# Patient Record
Sex: Female | Born: 1935 | Race: Asian | Hispanic: No | Marital: Single | Smoking: Former smoker
Health system: Southern US, Community
[De-identification: ages and names within clinical notes are randomized; demographics above are authoritative.]

## PROBLEM LIST (undated history)

## (undated) DIAGNOSIS — N6310 Unspecified lump in the right breast, unspecified quadrant: Secondary | ICD-10-CM

## (undated) DIAGNOSIS — M8580 Other specified disorders of bone density and structure, unspecified site: Secondary | ICD-10-CM

## (undated) DIAGNOSIS — R1312 Dysphagia, oropharyngeal phase: Secondary | ICD-10-CM

## (undated) DIAGNOSIS — E1140 Type 2 diabetes mellitus with diabetic neuropathy, unspecified: Secondary | ICD-10-CM

## (undated) DIAGNOSIS — D72829 Elevated white blood cell count, unspecified: Secondary | ICD-10-CM

## (undated) DIAGNOSIS — D509 Iron deficiency anemia, unspecified: Secondary | ICD-10-CM

## (undated) DIAGNOSIS — E089 Diabetes mellitus due to underlying condition without complications: Secondary | ICD-10-CM

## (undated) DIAGNOSIS — I110 Hypertensive heart disease with heart failure: Secondary | ICD-10-CM

## (undated) DIAGNOSIS — K219 Gastro-esophageal reflux disease without esophagitis: Secondary | ICD-10-CM

## (undated) DIAGNOSIS — R911 Solitary pulmonary nodule: Secondary | ICD-10-CM

## (undated) DIAGNOSIS — N182 Chronic kidney disease, stage 2 (mild): Secondary | ICD-10-CM

## (undated) DIAGNOSIS — I10 Essential (primary) hypertension: Secondary | ICD-10-CM

## (undated) DIAGNOSIS — K5900 Constipation, unspecified: Secondary | ICD-10-CM

## (undated) DIAGNOSIS — E559 Vitamin D deficiency, unspecified: Secondary | ICD-10-CM

## (undated) DIAGNOSIS — R55 Syncope and collapse: Secondary | ICD-10-CM

## (undated) DIAGNOSIS — M109 Gout, unspecified: Secondary | ICD-10-CM

## (undated) DIAGNOSIS — S22089A Unspecified fracture of T11-T12 vertebra, initial encounter for closed fracture: Secondary | ICD-10-CM

## (undated) DIAGNOSIS — E119 Type 2 diabetes mellitus without complications: Secondary | ICD-10-CM

## (undated) DIAGNOSIS — I5032 Chronic diastolic (congestive) heart failure: Secondary | ICD-10-CM

## (undated) DIAGNOSIS — M545 Low back pain: Secondary | ICD-10-CM

## (undated) DIAGNOSIS — F329 Major depressive disorder, single episode, unspecified: Secondary | ICD-10-CM

## (undated) HISTORY — DX: Vitamin D deficiency, unspecified: E55.9

## (undated) HISTORY — DX: Diabetes mellitus due to underlying condition without complications: E08.9

## (undated) HISTORY — DX: Constipation, unspecified: K59.00

## (undated) HISTORY — DX: Gastro-esophageal reflux disease without esophagitis: K21.9

## (undated) HISTORY — DX: Type 2 diabetes mellitus with diabetic neuropathy, unspecified: E11.40

## (undated) HISTORY — DX: Iron deficiency anemia, unspecified: D50.9

## (undated) HISTORY — DX: Chronic diastolic (congestive) heart failure: I50.32

## (undated) HISTORY — DX: Chronic kidney disease, stage 2 (mild): N18.2

## (undated) HISTORY — DX: Dysphagia, oropharyngeal phase: R13.12

## (undated) HISTORY — DX: Elevated white blood cell count, unspecified: D72.829

## (undated) HISTORY — DX: Major depressive disorder, single episode, unspecified: F32.9

## (undated) HISTORY — DX: Low back pain: M54.5

## (undated) HISTORY — DX: Hypertensive heart disease with heart failure: I11.0

## (undated) HISTORY — DX: Unspecified fracture of t11-T12 vertebra, initial encounter for closed fracture: S22.089A

## (undated) HISTORY — DX: Solitary pulmonary nodule: R91.1

## (undated) HISTORY — DX: Other specified disorders of bone density and structure, unspecified site: M85.80

## (undated) HISTORY — PX: NO PAST SURGERIES: SHX2092

## (undated) HISTORY — DX: Unspecified lump in the right breast, unspecified quadrant: N63.10

## (undated) HISTORY — DX: Syncope and collapse: R55

---

## 1999-07-29 ENCOUNTER — Other Ambulatory Visit: Admission: RE | Admit: 1999-07-29 | Discharge: 1999-07-29 | Payer: Self-pay | Admitting: Family Medicine

## 1999-12-26 ENCOUNTER — Emergency Department (HOSPITAL_COMMUNITY): Admission: EM | Admit: 1999-12-26 | Discharge: 1999-12-26 | Payer: Self-pay | Admitting: Emergency Medicine

## 2001-01-15 ENCOUNTER — Ambulatory Visit (HOSPITAL_BASED_OUTPATIENT_CLINIC_OR_DEPARTMENT_OTHER): Admission: RE | Admit: 2001-01-15 | Discharge: 2001-01-15 | Payer: Self-pay | Admitting: *Deleted

## 2001-01-15 ENCOUNTER — Encounter (INDEPENDENT_AMBULATORY_CARE_PROVIDER_SITE_OTHER): Payer: Self-pay | Admitting: *Deleted

## 2002-08-13 ENCOUNTER — Ambulatory Visit (HOSPITAL_COMMUNITY): Admission: RE | Admit: 2002-08-13 | Discharge: 2002-08-14 | Payer: Self-pay | Admitting: Ophthalmology

## 2002-08-13 ENCOUNTER — Encounter: Payer: Self-pay | Admitting: Ophthalmology

## 2005-12-14 ENCOUNTER — Inpatient Hospital Stay (HOSPITAL_COMMUNITY): Admission: EM | Admit: 2005-12-14 | Discharge: 2005-12-20 | Payer: Self-pay | Admitting: Emergency Medicine

## 2007-04-18 ENCOUNTER — Emergency Department (HOSPITAL_COMMUNITY): Admission: EM | Admit: 2007-04-18 | Discharge: 2007-04-18 | Payer: Self-pay | Admitting: Emergency Medicine

## 2007-10-12 ENCOUNTER — Emergency Department (HOSPITAL_COMMUNITY): Admission: EM | Admit: 2007-10-12 | Discharge: 2007-10-12 | Payer: Self-pay | Admitting: Emergency Medicine

## 2009-11-24 ENCOUNTER — Emergency Department (HOSPITAL_BASED_OUTPATIENT_CLINIC_OR_DEPARTMENT_OTHER): Admission: EM | Admit: 2009-11-24 | Discharge: 2009-11-25 | Payer: Self-pay | Admitting: Emergency Medicine

## 2009-11-25 ENCOUNTER — Ambulatory Visit: Payer: Self-pay | Admitting: Diagnostic Radiology

## 2010-06-27 DEATH — deceased

## 2010-09-13 LAB — CBC
MCHC: 33.8 g/dL (ref 30.0–36.0)
RBC: 3.29 MIL/uL — ABNORMAL LOW (ref 3.87–5.11)

## 2010-09-13 LAB — DIFFERENTIAL
Eosinophils Absolute: 0 10*3/uL (ref 0.0–0.7)
Lymphocytes Relative: 21 % (ref 12–46)
Lymphs Abs: 1 10*3/uL (ref 0.7–4.0)
Monocytes Absolute: 0.5 10*3/uL (ref 0.1–1.0)
Neutro Abs: 3.4 10*3/uL (ref 1.7–7.7)
Neutrophils Relative %: 68 % (ref 43–77)

## 2010-11-12 NOTE — H&P (Signed)
NAME:  Alcoser, Nolia                       ACCOUNT NO.:  [id number]   MEDICAL RECORD NO.:  [id number]          PATIENT TYPE:  EMS   LOCATION:  MAJO                         FACILITY:  [facility]   PHYSICIAN:  Sherin Quarry, MD      DATE OF BIRTH:  [birth year]   DATE OF ADMISSION:  12/14/2005  DATE OF DISCHARGE:                                HISTORY & PHYSICAL   HISTORY OF PRESENT ILLNESS:  Margaret [name] is a 77 year old lady of [place] who does not speak any [place].  Fortunately, she is accompanied by  her daughter who is quite fluent.  Her daughter states that Margaret [name] lives  with her son.  For the last 2 weeks, Margaret [name] has been experiencing  decreased appetite with a 15-pound weight loss.  She has been very thirsty,  has been urinating frequently, and has noted a lot of vaginal itching.  She  has not been vomiting.  She has not had any fevers or chills.  There has  been no abdominal pain, no change in bowel habits.  She presented to [facility] yesterday where it was suspected that she had  diabetes.  She was started on Glucophage, but unfortunately she could not  tolerate this medication because of nausea and vomiting.  Therefore, she was  referred to the emergency room for further evaluation.  On arrival to the  emergency room, her blood sugar was noted to Mykeisha a greater than 700.  Her pH  was 7.448, bicarbonate was 30.2, hemoglobin was 17, potassium 4.0.  She is  admitted at this time for management of uncontrolled diabetes.   PAST MEDICAL HISTORY:   CURRENT MEDICATIONS:  Hydrochlorothiazide 12.5 mg daily.   ALLERGIES:  There are no known drug allergies.   OPERATIONS:  Her daughter says that she can recall that on one occasion some  type of debridement procedure was done on her ear.  Otherwise, she has not  had any other surgery.   MEDICAL ILLNESSES:  She has never been hospitalized for medical illnesses.  She apparently at one point was diagnosed with hypertension  and is currently  taking hydrochlorothiazide.   FAMILY HISTORY:  She has a sister in [place] but she does not really know  much about her health status.  She has an aunt who has diabetes.  She says  that her mother died of cancer of the liver and that her father died when  she was very young, and she really does not know what the cause of his death  was.   SOCIAL HISTORY:  [name] smoked up until about 10 years ago when she  discontinued this practice.  She does not abuse alcohol or drugs.  As  previously mentioned, she lives with her son.  Normally, she is very  physically active.   REVIEW OF SYSTEMS:  HEAD:  She denies headache or dizziness.  EYES:  She has  noted some visual blurring.  There has been no diplopia.  EARS, NOSE,  THROAT:  She denies  earache, sinus pain or sore throat.  CHEST:  There has  been no coughing wheezing or chest pain.  Her daughter reports that she has  had mild dyspnea on exertion.  CARDIOVASCULAR:  She denies orthopnea, PND or  ankle edema.  GI: See above.  GU: There has been no dysuria.  There has been  no hematuria.  NEUROLOGIC:  There is no history of seizure or stroke.  ENDOCRINE:  See above.   PHYSICAL EXAMINATION:  GENERAL:  She is a very pleasant lady who is quite  cooperative.  HEENT:  Within normal limits.  CHEST:  Clear to auscultation.  Breath sounds are perhaps slightly  diminished.  CARDIOVASCULAR:  Normal S1 and S2.  There are no rubs, murmurs or gallops.  ABDOMEN:  Benign.  There are normal bowel sounds without masses or  tenderness.  NEUROLOGIC:  Neurologic testing and examination of extremities is normal.   IMPRESSION:  1.  Uncontrolled diabetes, glucose greater than 700.  2.  Dehydration.  3.  History of hypertension.  4.  Possible dysphagia.  5.  Fifty pack-year smoking history.   PLAN:  Initially, our plan should focus on treatment of her diabetes.  We  will give her vigorous IV fluid resuscitation and start IV insulin per   [name] protocol.  Will monitor blood sugars closely, as well as BMET.  Then, the patient will need to Irine placed on an outpatient regimen which will  probably consist of an oral hypoglycemic agent and long-acting insulin.  Will obtain a chest x-ray in light of her smoking history and probably do a  barium swallow in light of her history of dysphagia.           ______________________________  Sherin Quarry, MD     SY/MEDQ  D:  12/14/2005  T:  12/14/2005  Job:  [id number]

## 2010-11-12 NOTE — Discharge Summary (Signed)
NAME:  Margaret [name]                       ACCOUNT NO.:  [id number]   MEDICAL RECORD NO.:  [id number]          PATIENT TYPE:  INP   LOCATION:  5532                         FACILITY:  [facility]   PHYSICIAN:  Sherin Quarry, MD      DATE OF BIRTH:  15-Aug-1931   DATE OF ADMISSION:  12/14/2005  DATE OF DISCHARGE:  12/16/2005                                 DISCHARGE SUMMARY   Margaret [name] is a 77 year old lady of [place] who is not fluent in  [place].  She was accompanied by her daughter who offered translation. Her  daughter reported that for two weeks prior to admission, the patient had  been experiencing decreased appetite, excessive thirst, urinating  frequently, and vaginal itching. She had not been vomiting or having any  fevers, chills or abdominal pain. She presented [facility]  on June 19 where it was suspected that she had evidence of diabetes.  She  was started on Glucophage but, unfortunately, she could not tolerate this  medicine because of nausea and vomiting. When this was reported, she was  advised to come to the emergency room for further evaluation. On arrival to  the emergency room, a blood sugar was obtained which showed a glucose value  that was greater than 700, the BUN was 19, creatinine was 0.8, potassium  was 4, CO2 was 30. The patient was felt to have evidence of a hyperosmolar  non-ketotic state and, therefore, was admitted for further evaluation and  treatment.   Physical examination at the time of admission revealed a pleasant  cooperative lady who appeared to Noralyn moderately acutely ill.  HEENT exam was  within normal limits.  The chest was clear.  Cardiovascular exam showed  normal S1 and S2 without rubs, murmurs, or gallops.  The abdomen was benign.  On neurologic testing, cranial nerves, motor, sensory and cerebellar testing  was normal. Examination of the extremities showed no evidence of cyanosis or  edema.   Other relevant laboratory  studies included a urinalysis which was remarkable  only for glucosuria. Lipid profile showed a cholesterol of 142 with an LDL  of 90.  A1c hemoglobin was greater than 19.2   On admission the patient was placed on IV Glucomander protocol and she  remained on IV insulin for the next 12 hours.  She was also started on an IV  of normal saline.  She received 1 liter bolus, then 500 mL for per hour for  4 hours, then 250 mL/hour for 4 hours, then went 150 mL/hour.  The BMP was  very carefully monitored and not surprisingly her potassium level came down  to 2.9.  She, therefore, received supplemental potassium by the intravenous  and oral route.  By June 21, her blood sugar was down to the 100 range and,  therefore, intravenous insulin was discontinued. At the time the intravenous  insulin was discontinued, the patient was given 10 units of Lantus insulin.  Subsequently, the dosage of Lantus insulin was gradually increased and  Lantus insulin was supplemented  with Glucotrol, initially 5 mg b.i.d.  It  was felt that the patient might respond well to a combination of long acting  insulin and an oral hypoglycemic agent.  I did not resume Glucophage because  of the patient's experience of nausea and vomiting with this medication.  There was some difficulty in obtaining assistance from someone who was  fluent in English to translate for her, eventually I was able to get a  granddaughter to come in and assist [place] with this process. Before the patient  was discharged, it was ensured that there was an understanding of how to  administer insulin and how to measure blood sugars.   DISCHARGE DIAGNOSIS:  1.  Uncontrolled diabetes with hyperosmolar state and A1c hemoglobin greater      than 19.  2.  Dehydration secondary to number 1.  3.  History of hypertension, note the patient's blood pressure was 110 to      120 over 70 during the hospitalization.  4.  Possible dysphagia.  5.  50-pack-year smoking  history.   DISCHARGE MEDICATIONS:  The patient was instructed to withhold  hydrochlorothiazide. She was given Protonix 40 mg daily, Lantus insulin 30  units daily, and Glucotrol XL 10 mg the morning and 5 mg in the evening.  It  was carefully explained both to the patient and to the family that there  would Nakisha no practical way to optimally regulate her diabetes in the hospital  and she would need very frequent follow up with her primary care doctor to  adjust her medications and assure she had good blood sugar control.  I urged  them to contact Dr. Pablo Lawrence office on Monday to arrange a return appointment  during the next week.  The family members appeared to understand these  instructions.           ______________________________  Sherin Quarry, MD     SY/MEDQ  D:  12/17/2005  T:  12/17/2005  Job:  [id number]   cc:   Molly Maduro L. Foy [place], M.D.  Fax: [phone]

## 2010-11-12 NOTE — Discharge Summary (Signed)
NAME:  Margaret [name], Margaret [name]                       ACCOUNT NO.:  [id number]   MEDICAL RECORD NO.:  [id number]          PATIENT TYPE:  INP   LOCATION:  5532                         FACILITY:  [facility]   PHYSICIAN:  Sherin Quarry, MD      DATE OF BIRTH:  01-08-1932   DATE OF ADMISSION:  12/14/2005  DATE OF DISCHARGE:  12/20/2005                                 DISCHARGE SUMMARY   HISTORY OF PRESENT ILLNESS:  Margaret [name] is a 77 year old lady who initially  presented to [name] on December 14, 2005 with a 2-week history of  anorexia, 15 pound weight loss, excessive thirst, urinating frequently and  vaginal itching.  She presented to [name] were she was  seen by Margaret [name] who suspected that she had worsening diabetes, and,  therefore, started her on Glucophage.  Unfortunately, she could not tolerate  this medication because of nausea and vomiting.  The family, therefore,  brought her to the emergency room where her blood sugar was noted to Margaret [name]  greater than 700.  She was not acidotic.  She was admitted for management of  uncontrolled diabetes.   PHYSICAL EXAMINATION AT THE TIME OF ADMISSION:  HEENT:  Within normal  limits.  CHEST:  Clear.  CARDIOVASCULAR:  Normal S1-S2.  ABDOMEN:  Benign.  NEUROLOGIC:  Neurologic testing and examination of the extremities was  normal.   OTHER RELEVANT LABORATORY STUDIES:  Sodium 128, potassium 4.0, BUN of 19,  creatinine 0.8.  CO2 was 30.  A1c hemoglobin was greater than 19.2.  Lipid  profile showed an LDL cholesterol of 90.  Urinalysis was negative.  A chest  x-ray showed only a stable 1-cm nodule which had been seen on previous x-  rays.   On admission, the patient was placed on [name] protocol of intravenous  insulin.  She was given an IV of normal saline, 1 liter as a bolus and the  500 cc per hour x 4 hours, 250 cc per hour x 4 hours, then 150 cc per hour.  She was placed on Protonix 40 mg IV every 12 hours.  Zofran was given  p.r.n.  for nausea.  After she had been on the intravenous insulin for a while, she  became hypokalemic and was therefore given supplemental potassium by the  intravenous route.  On December 15, 2005, her intravenous insulin was  discontinued, and she was given Lantus insulin.  Subsequently, her blood  sugars were monitored, and she was found to Margaret [name] quite insulin resistant.  Her  Lantus insulin dose was steadily increased until, by December 19, 2005, she was  receiving 35 units in the morning, 45 units in the evening.  This was  supplemented with oral agents, as noted below.  Blood sugars were monitored  on a q.i.d. basis, and by December 20, 2005, the blood sugars were down to the  range of 150-170.  During the course her hospitalization, extensive efforts  were made to teach the family how to administer insulin and how to check  blood  sugars.  Multiple family members were involved in this process.  The  diabetes coordinator also saw the patient.  On December 20, 2005, it was felt  the patient was ready for discharge.   DISCHARGE DIAGNOSES:  1.  Uncontrolled diabetes.  A1c hemoglobin greater than 19.  2.  Dehydration.  3.  History of hypertension.  4.  Fifty pack-year smoking history.   DISCHARGE MEDICATIONS:  1.  Protonix 40 mg daily.  2.  K-Dur 20 mEq daily.  3.  Glucotrol XL 5 mg p.o. in the morning, 10 mg p.o. in the evening.  4.  Lantus insulin 35 units in the morning, 45 units in the evening.  5.  Avandia 4 mg daily.  (The patient will not receive Glucophage because of her previous reaction to  this medication.)   I contacted Margaret [name] at [name] and advised her to Wyn[place]  sure that their office contacted Margaret [name] to ensure that she would come back  for regular appointments.  They indicated that she would see a diabetes  educator there.  A home health nurse referral was made.           ______________________________  Sherin Quarry, MD     SY/MEDQ  D:  12/20/2005  T:   12/20/2005  Job:  [id number]

## 2010-11-12 NOTE — Op Note (Signed)
[place]  Patient:    Margaret [name], Margaret [name]                            MRN: [id number] Proc. Date: 01/15/01 Adm. Date:  [id number] Attending:  Claudina Lick                           Operative Report  PREOPERATIVE DIAGNOSIS:  Attic and mastoid cholesteatoma, right ear.  POSTOPERATIVE DIAGNOSIS:  Attic and mastoid cholesteatoma, right ear.  OPERATION PERFORMED:  Right tympanomastoidectomy with ossicular reconstruction.  SURGEON:  Robert L. Lyman Bishop, M.D.  ANESTHESIA:  General.  INDICATIONS FOR PROCEDURE:  This 77 year old white female was admitted with a history of pain and pressure and drainage from her right ear.  The patient has had previous mastoid surgery approximately 10 years prior to admission.  This patient does not speak [place].  Her daughter related history.  She did not recall who did the surgery or precisely what was done.  When seen in the office the patient had an intact right tympanic membrane without landmarks and there was a brownish crust on the posterior superior canal wall which when removed revealed some cholesteatoma debris.  On the left side, the patient has a large central perforation again with loss of landmarks.  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 The patient admitted for surgery.  DESCRIPTION OF PROCEDURE:  After satisfactory general endotracheal anesthesia had been induced, 1% Xylocaine containing 1:100,100 epinephrine was infiltrated postauricularly and in the right external  ear canal after which the external ear and surrounding area were prepped with Betadine and sterile drapes applied.  Vertical incisions were made in the canal at 12 and 6 oclock and connected horizontally over the posterior canal wall medial to the bony cartilaginous junction.  The previous postauricular incision was reopened. The periosteum was incised over the mastoid cortex and immediately underlying this was a large  mass of  cholesteatoma completely filling the mastoid cavity. The skin was then elevated from the posterior canal and the external ear and lateral posterior can skin were then retracted out of the way with [facility] retractor.  The remaining more medial portion of the posterior canal skin was then elevated down to the fibrous annulus and the middle ear space was entered with a shallow middle ear space with some scarring but no fluid and no evidence of cholesteatoma.  There was, however, a large attic defect that communicated with the large mastoid cholesteatoma.  I could not identify any ossicles and there was a large mass of cholesteatoma inthe attic region.  I did not feel that this could Ashiah appropriately treated with an intact canal wall procedure.  Therefore, the posterior canal wall was taken down with the Lempert rongeurs and the anterior and posterior buttresses smoothed down with the drill.  The mass of cholesteatoma debris was removed from the mastoid cavity leaving the cholesteatoma matrix as much intact as possible.  This resulted in a small mastoid cavity.  In the attic region were found remnants of the incus and the head of the malleus, both of which were chronically diseased and completely encased with cholesteatoma and were removed.  There was a thin, fixed piece of bone over the stapedial area covered with a thin layer of skin.  The skin was removed.  The bone was dissected up and I could see it covered the stapes,  the head of which was absent but the stapes otherwise appeared intact and mobile.  The shelf of bone was removed. A piece of temporalis fascia had been taken for use as a graft and placed aside to dry.  The external meatus was enlarged removing some conchal cartilage and a small portion of this conchal cartilage was placed over the remaining superstructure of stapes and after packing the remaining middle ear with pledgets of Gelfoam saturated with Cortisporin  suspension. The temporalis fascia  graft was placed under the remaining eardrum allowed over the cartilage graft onto the facial ridge from which the squamous epithelium had been elevated and then this was replaced over the graft.  The posterior canal wall had been taken down with the  drill almost flush with the tip of the small mastoid cavity and the inferiorly based posterior canal skin was then allowed to extend over the remaining posterior canal wall but not overlapping the cholesteatoma matrix in the mastoid itself.  Pledgets of Gelfoam saturated with Cortisporin suspension were placed over the grafted area and the remainder of the canal and mastoid cavity was packed with three two inch strips of Adaptic gauze impregnated with Cortisporin ointment.  Postauricular incision was closed with 3 and 4 chromic gut to the subcutaneous tissues with running 5-0 nylon to the skin.  A mattress suture of 2-0 silk was placed to the external ear posteriorly and through the lateral posterior canal skin flap and tied down over a bolus both in the external meatus and externally to secure the skin in the enlarged external meatus.  A lightly compressive dressing was applied.  Estimated blood loss 10 to 15 cc.  The patient tolerated the procedure well, was awakened from anesthesia and taken to the recovery room in satisfactory condition. DD:  01/15/01 TD:  01/15/01 Job: [id number]

## 2010-11-12 NOTE — Op Note (Signed)
NAME:  Margaret [name], Margaret [name] THI                               ACCOUNT NO.:  [id number]   MEDICAL RECORD NO.:  [id number]                   PATIENT TYPE:  OIB   LOCATION:  2550                                 FACILITY:  [facility]   PHYSICIAN:  John D. Ashley Royalty, M.D.              DATE OF BIRTH:  04/20/1932   DATE OF PROCEDURE:  08/13/2002  DATE OF DISCHARGE:                                 OPERATIVE REPORT   ADMISSION DIAGNOSIS:  Vitreous hemorrhage, left eye; possible retinal  detachment, left eye.   POSTOPERATIVE DIAGNOSIS:  Vitreous hemorrhage, left eye and subretinal  fibrosis, left eye and preretinal fibrosis, left eye.   OPERATION PERFORMED:  Pars plana vitrectomy with retinal photocoagulation  and membrane peel, left eye.   SURGEON:  Beulah Gandy. Ashley Royalty, M.D.   ASSISTANT:  Merian Capron, MA   ANESTHESIA:  General.   DESCRIPTION OF PROCEDURE:  Usual prep and drape.  Peritomies at 10, 2 and 4  o'clock.  A 4 mm angled infusion port anchored into place at 4 o'clock.  The  lighted pick was placed at 10 and 2 o'clock respectively.  The contact lens  ring was anchored into place at 6 and 12 o'clock.  The pars plana vitrectomy  was begun just behind the pseudophakos.  A white leathery vitreous was  encountered.  This was carefully removed under low suction and rapid  cutting.  The core vitrectomy was completed and the posterior retina became  apparent.  There were large amounts of old white blood beneath the retina  and several areas of dark red blood beneath the retina.  This process  appeared to Kawehi old and fibrotic.  There was no active bleeding.  The  vitrectomy was carried down to the macular surface where fibrotic membranes  were carefully peeled with a membrane pick and removed with the vitreous  cutter.  The vitrectomy was carried into the peripheral vitreous space with  a 30 degree prismatic lens and all the vitreous was removed down to the  vitreous base for 360 degrees.  Once this was  completed, the endo laser was  positioned in the eye and 291 burns were placed around the retinal periphery  with a power of 400 mw, 1000 microns each and 0.1 seconds each.  The  instruments were removed from the eye and 9-0 nylon was used to close the  sclerotomy sites.  They were tested and found to Genene tight with a Weck-cel.  Closing tension was 10 with a Barraquer tonometer.  Polymyxin and gentamicin  were irrigated into Tenon's space.  Atropine solution was applied.  Decadron  10 mg was injected into the lower subconjunctival space.  Marcaine was  injected around the globe for postoperative pain.  The conjunctiva was  closed with wet field cautery.  Polysporin, a patch and shield were placed.  The patient was awakened and taken to  recovery in satisfactory condition.                                                Beulah Gandy. Ashley Royalty, M.D.    JDM/MEDQ  D:  08/13/2002  T:  08/13/2002  Job:  [id number]

## 2010-12-13 ENCOUNTER — Emergency Department (HOSPITAL_COMMUNITY)
Admission: EM | Admit: 2010-12-13 | Discharge: 2010-12-13 | Disposition: A | Discharge status: Home / Self Care | Payer: Medicare Other | Attending: Emergency Medicine | Admitting: Emergency Medicine

## 2010-12-13 ENCOUNTER — Emergency Department (HOSPITAL_COMMUNITY): Payer: Medicare Other

## 2010-12-13 DIAGNOSIS — E119 Type 2 diabetes mellitus without complications: Secondary | ICD-10-CM | POA: Insufficient documentation

## 2010-12-13 DIAGNOSIS — M109 Gout, unspecified: Secondary | ICD-10-CM | POA: Insufficient documentation

## 2010-12-13 DIAGNOSIS — M79609 Pain in unspecified limb: Secondary | ICD-10-CM | POA: Insufficient documentation

## 2010-12-13 DIAGNOSIS — E7800 Pure hypercholesterolemia, unspecified: Secondary | ICD-10-CM | POA: Insufficient documentation

## 2010-12-13 DIAGNOSIS — I10 Essential (primary) hypertension: Secondary | ICD-10-CM | POA: Insufficient documentation

## 2010-12-13 LAB — DIFFERENTIAL
Basophils Absolute: 0 10*3/uL (ref 0.0–0.1)
Basophils Relative: 0 % (ref 0–1)
Lymphocytes Relative: 22 % (ref 12–46)
Lymphs Abs: 1.3 10*3/uL (ref 0.7–4.0)
Monocytes Absolute: 0.4 10*3/uL (ref 0.1–1.0)
Monocytes Relative: 7 % (ref 3–12)
Neutro Abs: 4.3 10*3/uL (ref 1.7–7.7)
Neutrophils Relative %: 70 % (ref 43–77)

## 2010-12-13 LAB — BASIC METABOLIC PANEL
BUN: 14 mg/dL (ref 6–23)
CO2: 31 mEq/L (ref 19–32)
Chloride: 99 mEq/L (ref 96–112)
Creatinine, Ser: 0.84 mg/dL (ref 0.50–1.10)
Glucose, Bld: 114 mg/dL — ABNORMAL HIGH (ref 70–99)
Potassium: 3.6 mEq/L (ref 3.5–5.1)
Sodium: 140 mEq/L (ref 135–145)

## 2010-12-13 LAB — CBC
HCT: 35 % — ABNORMAL LOW (ref 36.0–46.0)
MCH: 28.4 pg (ref 26.0–34.0)
MCHC: 32 g/dL (ref 30.0–36.0)
MCV: 88.8 fL (ref 78.0–100.0)
Platelets: 244 10*3/uL (ref 150–400)
RBC: 3.94 MIL/uL (ref 3.87–5.11)

## 2010-12-14 ENCOUNTER — Emergency Department (HOSPITAL_COMMUNITY)
Admission: EM | Admit: 2010-12-14 | Discharge: 2010-12-14 | Disposition: A | Discharge status: Home / Self Care | Payer: Medicare Other | Attending: Emergency Medicine | Admitting: Emergency Medicine

## 2010-12-14 DIAGNOSIS — M79609 Pain in unspecified limb: Secondary | ICD-10-CM | POA: Insufficient documentation

## 2010-12-14 DIAGNOSIS — I10 Essential (primary) hypertension: Secondary | ICD-10-CM | POA: Insufficient documentation

## 2010-12-14 DIAGNOSIS — E7800 Pure hypercholesterolemia, unspecified: Secondary | ICD-10-CM | POA: Insufficient documentation

## 2010-12-14 DIAGNOSIS — M109 Gout, unspecified: Secondary | ICD-10-CM | POA: Insufficient documentation

## 2010-12-14 DIAGNOSIS — E119 Type 2 diabetes mellitus without complications: Secondary | ICD-10-CM | POA: Insufficient documentation

## 2011-01-26 ENCOUNTER — Emergency Department (HOSPITAL_COMMUNITY)
Admission: EM | Admit: 2011-01-26 | Discharge: 2011-01-26 | Disposition: A | Discharge status: Home / Self Care | Payer: Medicare Other | Attending: Emergency Medicine | Admitting: Emergency Medicine

## 2011-01-26 ENCOUNTER — Emergency Department (HOSPITAL_COMMUNITY): Payer: Medicare Other

## 2011-01-26 DIAGNOSIS — I10 Essential (primary) hypertension: Secondary | ICD-10-CM | POA: Insufficient documentation

## 2011-01-26 DIAGNOSIS — E7800 Pure hypercholesterolemia, unspecified: Secondary | ICD-10-CM | POA: Insufficient documentation

## 2011-01-26 DIAGNOSIS — R059 Cough, unspecified: Secondary | ICD-10-CM | POA: Insufficient documentation

## 2011-01-26 DIAGNOSIS — R05 Cough: Secondary | ICD-10-CM | POA: Insufficient documentation

## 2011-01-26 DIAGNOSIS — E119 Type 2 diabetes mellitus without complications: Secondary | ICD-10-CM | POA: Insufficient documentation

## 2011-01-26 DIAGNOSIS — J40 Bronchitis, not specified as acute or chronic: Secondary | ICD-10-CM | POA: Insufficient documentation

## 2011-01-26 LAB — GLUCOSE, CAPILLARY: Glucose-Capillary: 153 mg/dL — ABNORMAL HIGH (ref 70–99)

## 2011-03-22 LAB — CBC
HCT: 38.4
MCHC: 32.9
WBC: 4.3

## 2011-03-22 LAB — DIFFERENTIAL
Basophils Absolute: 0
Basophils Relative: 0
Eosinophils Relative: 2
Lymphocytes Relative: 20
Monocytes Absolute: 0.3
Monocytes Relative: 7

## 2011-03-22 LAB — BASIC METABOLIC PANEL
Chloride: 101
Potassium: 3.4 — ABNORMAL LOW
Sodium: 141

## 2011-06-28 ENCOUNTER — Emergency Department (INDEPENDENT_AMBULATORY_CARE_PROVIDER_SITE_OTHER): Payer: Medicare Other

## 2011-06-28 ENCOUNTER — Emergency Department (HOSPITAL_BASED_OUTPATIENT_CLINIC_OR_DEPARTMENT_OTHER)
Admission: EM | Admit: 2011-06-28 | Discharge: 2011-06-28 | Disposition: A | Discharge status: Home / Self Care | Payer: Medicare Other | Attending: Emergency Medicine | Admitting: Emergency Medicine

## 2011-06-28 ENCOUNTER — Encounter: Payer: Self-pay | Admitting: *Deleted

## 2011-06-28 DIAGNOSIS — W101XXA Fall (on)(from) sidewalk curb, initial encounter: Secondary | ICD-10-CM | POA: Insufficient documentation

## 2011-06-28 DIAGNOSIS — E119 Type 2 diabetes mellitus without complications: Secondary | ICD-10-CM | POA: Diagnosis not present

## 2011-06-28 DIAGNOSIS — S7002XA Contusion of left hip, initial encounter: Secondary | ICD-10-CM

## 2011-06-28 DIAGNOSIS — M47817 Spondylosis without myelopathy or radiculopathy, lumbosacral region: Secondary | ICD-10-CM

## 2011-06-28 DIAGNOSIS — S7000XA Contusion of unspecified hip, initial encounter: Secondary | ICD-10-CM | POA: Diagnosis not present

## 2011-06-28 DIAGNOSIS — M19049 Primary osteoarthritis, unspecified hand: Secondary | ICD-10-CM | POA: Diagnosis not present

## 2011-06-28 DIAGNOSIS — W19XXXA Unspecified fall, initial encounter: Secondary | ICD-10-CM | POA: Diagnosis not present

## 2011-06-28 DIAGNOSIS — M25559 Pain in unspecified hip: Secondary | ICD-10-CM | POA: Insufficient documentation

## 2011-06-28 DIAGNOSIS — S60229A Contusion of unspecified hand, initial encounter: Secondary | ICD-10-CM

## 2011-06-28 DIAGNOSIS — I10 Essential (primary) hypertension: Secondary | ICD-10-CM | POA: Insufficient documentation

## 2011-06-28 HISTORY — DX: Essential (primary) hypertension: I10

## 2011-06-28 MED ORDER — NAPROXEN 250 MG PO TABS
500.0000 mg | ORAL_TABLET | Freq: Once | ORAL | Status: AC
  Administered 2011-06-28: 500 mg via ORAL
  Filled 2011-06-28: qty 2

## 2011-06-28 MED ORDER — NAPROXEN 500 MG PO TABS: 500.0000 mg | ORAL_TABLET | Freq: Two times a day (BID) | ORAL | Status: DC

## 2011-06-28 MED ORDER — IBUPROFEN 800 MG PO TABS
800.0000 mg | ORAL_TABLET | Freq: Once | ORAL | Status: AC
  Administered 2011-06-28: 800 mg via ORAL
  Filled 2011-06-28: qty 1

## 2011-06-28 NOTE — ED Provider Notes (Signed)
History     CSN: [id number]  Arrival date & time 06/28/11  1948   First MD Initiated Contact with Patient 06/28/11 2011      Chief Complaint  Patient presents with  . Fall    (Consider location/radiation/quality/duration/timing/severity/associated sxs/prior treatment) HPI Comments: 78 year old female with t past medical history  of hypertension and borderline diabetes presents with complaint of pain to the left hip after she fell 2 days ago. This occurred acute in onset while she was walking on the sidewalk and tripped landing on the cement curb. Pain has been constant, worse with ambulation, no pain when resting in a supine position. She does have associated bruising to the lateral hip on the left. She has no pain in her knee or her ribs neck back shoulder or elbow. She does have some bruising to her left hand.  Patient is a 78 y.o. female presenting with fall. The history is provided by the patient and a relative. A language interpreter was used.  Fall    Past Medical History  Diagnosis Date  . Hypertension   . Diabetes mellitus     History reviewed. No pertinent past surgical history.  No family history on file.  History  Substance Use Topics  . Smoking status: Never Smoker   . Smokeless tobacco: Not on file  . Alcohol Use: No    OB History    Grav Para Term Preterm Abortions TAB SAB Ect Mult Living                  Review of Systems  All other systems reviewed and are negative.    Allergies  Review of patient's allergies indicates no known allergies.  Home Medications   Current Outpatient Rx  Name Route Sig Dispense Refill  . ACETAMINOPHEN 500 MG PO TABS Oral Take 1,000 mg by mouth every 6 (six) hours as needed. For pain      . LOSARTAN POTASSIUM 25 MG PO TABS Oral Take 25 mg by mouth daily.      Marland Kitchen METFORMIN HCL ER 500 MG PO TB24 Oral Take 500 mg by mouth every evening.        BP 142/89  Pulse 78  Temp(Src) 98.3 F (36.8 C) (Oral)  Resp 20  SpO2  97%  Physical Exam  Nursing note and vitals reviewed. Constitutional: She appears well-developed and well-nourished. No distress.  HENT:  Head: Normocephalic and atraumatic.  Mouth/Throat: Oropharynx is clear and moist. No oropharyngeal exudate.  Eyes: Conjunctivae and EOM are normal. Pupils are equal, round, and reactive to light. Right eye exhibits no discharge. Left eye exhibits no discharge. No scleral icterus.  Neck: Normal range of motion. Neck supple. No JVD present. No thyromegaly present.  Cardiovascular: Normal rate, regular rhythm, normal heart sounds and intact distal pulses.  Exam reveals no gallop and no friction rub.   No murmur heard. Pulmonary/Chest: Effort normal and breath sounds normal. No respiratory distress. She has no wheezes. She has no rales.  Abdominal: Soft. Bowel sounds are normal. She exhibits no distension and no mass. There is no tenderness.  Musculoskeletal: Normal range of motion. She exhibits tenderness ( Mild tenderness with palpation over the left lateral hip. There is no pain with range of motion of the left hip including internal and external rotation and flexion. She has normal range of motion of all joints of the left lower extremity.). She exhibits no edema.       Mild bruising to the dorsum of the  left hand on the ulnar surface of the hand. Normal range of motion of the hand and grip and extension with no tendon deficit  Lymphadenopathy:    She has no cervical adenopathy.  Neurological: She is alert. Coordination normal.  Skin: Skin is warm and dry.       Bruising present to the left lateral hip  Psychiatric: She has a normal mood and affect. Her behavior is normal.    ED Course  Procedures (including critical care time)  Labs Reviewed - No data to display No results found.   1. Contusion of left hip       MDM  Bruising to the dorsum of the left hand and the left lateral hip. X-ray of the left hip appears normal with no signs of fracture  per my interpretation, left hand x-ray pending  X-rays negative for fracture of the hip, pelvis, left hand. Pain prescription medication given     Prescription  #1 Naprosyn   Vida Roller, MD 06/29/11 [phone]

## 2011-06-28 NOTE — ED Notes (Signed)
Script for naprosyn given

## 2011-06-28 NOTE — ED Notes (Signed)
2 days ago she tripped over the curb and fell onto a concrete sidewalk. Soreness to her left hip. Able to walk with the assistance of a cane.

## 2011-07-18 ENCOUNTER — Encounter (HOSPITAL_BASED_OUTPATIENT_CLINIC_OR_DEPARTMENT_OTHER): Payer: Self-pay | Admitting: Family Medicine

## 2011-07-18 ENCOUNTER — Emergency Department (INDEPENDENT_AMBULATORY_CARE_PROVIDER_SITE_OTHER): Payer: Medicare Other

## 2011-07-18 ENCOUNTER — Emergency Department (HOSPITAL_BASED_OUTPATIENT_CLINIC_OR_DEPARTMENT_OTHER)
Admission: EM | Admit: 2011-07-18 | Discharge: 2011-07-18 | Disposition: A | Discharge status: Home / Self Care | Payer: Medicare Other | Attending: Emergency Medicine | Admitting: Emergency Medicine

## 2011-07-18 DIAGNOSIS — S32509A Unspecified fracture of unspecified pubis, initial encounter for closed fracture: Secondary | ICD-10-CM | POA: Diagnosis not present

## 2011-07-18 DIAGNOSIS — W19XXXA Unspecified fall, initial encounter: Secondary | ICD-10-CM | POA: Diagnosis not present

## 2011-07-18 DIAGNOSIS — I10 Essential (primary) hypertension: Secondary | ICD-10-CM | POA: Insufficient documentation

## 2011-07-18 DIAGNOSIS — E119 Type 2 diabetes mellitus without complications: Secondary | ICD-10-CM | POA: Diagnosis not present

## 2011-07-18 DIAGNOSIS — Z79899 Other long term (current) drug therapy: Secondary | ICD-10-CM | POA: Diagnosis not present

## 2011-07-18 DIAGNOSIS — M25559 Pain in unspecified hip: Secondary | ICD-10-CM

## 2011-07-18 DIAGNOSIS — S32599A Other specified fracture of unspecified pubis, initial encounter for closed fracture: Secondary | ICD-10-CM

## 2011-07-18 MED ORDER — HYDROCODONE-ACETAMINOPHEN 5-325 MG PO TABS: 2.0000 | ORAL_TABLET | ORAL | Status: AC | PRN

## 2011-07-18 MED ORDER — NAPROXEN 250 MG PO TABS
500.0000 mg | ORAL_TABLET | Freq: Once | ORAL | Status: AC
  Administered 2011-07-18: 500 mg via ORAL
  Filled 2011-07-18: qty 2

## 2011-07-18 NOTE — ED Provider Notes (Signed)
History   This chart was scribed for Margaret Octave, MD by Melba Coon. The patient was seen in room MH12/MH12 and the patient's care was started at 3:24PM.    CSN: [id number]  Arrival date & time 07/18/11  1503   First MD Initiated Contact with Patient 07/18/11 1521      Chief Complaint  Patient presents with  . Hip Pain    (Consider location/radiation/quality/duration/timing/severity/associated sxs/prior treatment) HPI A Level 5 Caveat applies due to the condition of the patient.  Margaret [name] is a 78 y.o. female who presents to the Emergency Department complaining of constant, moderate to severe left hip pain with an onset 2 weeks ago. Pt's grandson was translating during PE and ROS. Pt was in ED 2 weeks ago for same problem but has not felt better since. No other falls since onset. Pt has been ambulatory with a cane. She has taken pain meds which alleviate the pain slightly. Slight back pain, no neck pain. No other medical problems.   Past Medical History  Diagnosis Date  . Hypertension   . Diabetes mellitus     History reviewed. No pertinent past surgical history.  No family history on file.  History  Substance Use Topics  . Smoking status: Never Smoker   . Smokeless tobacco: Not on file  . Alcohol Use: No    OB History    Grav Para Term Preterm Abortions TAB SAB Ect Mult Living                  Review of Systems  Unable to perform ROS   Allergies  Review of patient's allergies indicates no known allergies.  Home Medications   Current Outpatient Rx  Name Route Sig Dispense Refill  . ACETAMINOPHEN 500 MG PO TABS Oral Take 1,000 mg by mouth every 6 (six) hours as needed. For pain      . FUROSEMIDE 40 MG PO TABS Oral Take 40 mg by mouth daily.    Marland Kitchen LOSARTAN POTASSIUM 25 MG PO TABS Oral Take 25 mg by mouth daily.      Marland Kitchen METFORMIN HCL ER 500 MG PO TB24 Oral Take 500 mg by mouth every evening.      Marland Kitchen NAPROXEN 500 MG PO TABS Oral Take 1 tablet (500 mg  total) by mouth 2 (two) times daily with a meal. 30 tablet 0  . POTASSIUM CHLORIDE CRYS ER 20 MEQ PO TBCR Oral Take 20 mEq by mouth daily.    Marland Kitchen HYDROCODONE-ACETAMINOPHEN 5-325 MG PO TABS Oral Take 2 tablets by mouth every 4 (four) hours as needed for pain. 10 tablet 0    BP 138/62  Pulse 68  Temp(Src) 97.8 F (36.6 C) (Oral)  Resp 16  Wt 130 lb (58.968 kg)  SpO2 97%  Physical Exam  Constitutional: She is oriented to person, place, and time. She appears well-developed and well-nourished.  HENT:  Head: Normocephalic and atraumatic.  Right Ear: External ear normal.  Left Ear: External ear normal.  Eyes: Conjunctivae and EOM are normal. Pupils are equal, round, and reactive to light. No scleral icterus.  Neck: Normal range of motion. Neck supple. No thyromegaly present.  Cardiovascular: Normal rate, regular rhythm and normal heart sounds.  Exam reveals no gallop and no friction rub.   No murmur heard. Pulmonary/Chest: Effort normal and breath sounds normal. No stridor. She has no wheezes. She has no rales. She exhibits no tenderness.  Abdominal: Soft. She exhibits no distension. There is tenderness (  midline is tender, no deformities). There is no rebound.  Musculoskeletal: Normal range of motion. She exhibits tenderness (Good DTs and PTs 2+, tenderness in left lateral hip; slight pain with external rotation of left leg). She exhibits no edema.       Right shoulder: She exhibits normal range of motion (full ROM with slight discomfort in left lateral hip).  Lymphadenopathy:    She has no cervical adenopathy.  Neurological: She is alert and oriented to person, place, and time. Coordination normal.  Skin: Skin is warm. Ecchymosis ( healing ecchymosis on left lateral hip) noted. No rash noted. No erythema.  Psychiatric: She has a normal mood and affect. Her behavior is normal.    ED Course  Procedures (including critical care time) DIAGNOSTIC STUDIES: Oxygen Saturation is 96% on room  air, adequate by my interpretation.    COORDINATION OF CARE:  4:58PM - EDMD lets pt know that results were negative; consult to possibly see specialist; plans for d/c; pt agrees   Labs Reviewed - No data to display Ct Pelvis Wo Contrast  07/18/2011  *RADIOLOGY REPORT*  Clinical Data:  Larey Seat 3 weeks ago.  Persistent left hip pain.  CT PELVIS WITHOUT CONTRAST  Technique:  Multidetector CT imaging of the pelvis was performed following the standard protocol without intravenous contrast.  Comparison:  Left hip radiographs 06/28/2011.  Findings:  There are nondisplaced superior and inferior pubic rami fractures likely account for the patient's pain.  No hip fracture. No evidence of avascular necrosis.  The pubic symphysis and SI joints are intact.  Bony resorptive changes and early   healing changes with callus formation.  No significant intrapelvic abnormalities are demonstrated.  No inguinal mass or hernia.  IMPRESSION:  1.  Left-sided superior and inferior pubic rami fractures with early healing changes. 2.  No hip fracture.  Original Report Authenticated By: P. Loralie Champagne, M.D.     1. Pubic ramus fracture       MDM  Left hip pain since a fall on January 1. As per had persistent pain that is somewhat controlled naproxen and patient is able to ambulate with a cane. Denies any trauma. No other injuries.  Pain to palpation of the left lateral hip with pain external rotation. No deformity or limb shortening.  CT remarkable for left superior and inferior pubic ramus fractures.  D/w Dr. Dion Saucier.  Patient has been ambulatory since the fall.  He recommends walker with weight bearing as tolerated.  He will see her Weds and Thurs.   I personally performed the services described in this documentation, which was scribed in my presence.  The recorded information has been reviewed and considered.         Margaret Octave, MD 07/19/11 [phone]

## 2011-07-18 NOTE — ED Notes (Signed)
Pt c/o left hip pain x 2 wks since Fall. Pt grandson sts pt was brought here and xray negative for hip but "pain is same".

## 2011-07-18 NOTE — ED Notes (Signed)
MD at bedside. 

## 2011-07-18 NOTE — ED Notes (Signed)
Patient transported to CT via w/c. 

## 2011-07-20 DIAGNOSIS — S32509A Unspecified fracture of unspecified pubis, initial encounter for closed fracture: Secondary | ICD-10-CM | POA: Diagnosis not present

## 2011-08-15 DIAGNOSIS — S32509A Unspecified fracture of unspecified pubis, initial encounter for closed fracture: Secondary | ICD-10-CM | POA: Diagnosis not present

## 2011-09-05 DIAGNOSIS — S32509A Unspecified fracture of unspecified pubis, initial encounter for closed fracture: Secondary | ICD-10-CM | POA: Diagnosis not present

## 2011-10-03 DIAGNOSIS — R29898 Other symptoms and signs involving the musculoskeletal system: Secondary | ICD-10-CM | POA: Diagnosis not present

## 2011-10-03 DIAGNOSIS — I5030 Unspecified diastolic (congestive) heart failure: Secondary | ICD-10-CM | POA: Diagnosis not present

## 2011-10-03 DIAGNOSIS — I10 Essential (primary) hypertension: Secondary | ICD-10-CM | POA: Diagnosis not present

## 2011-10-03 DIAGNOSIS — M109 Gout, unspecified: Secondary | ICD-10-CM | POA: Diagnosis not present

## 2011-10-03 DIAGNOSIS — K5900 Constipation, unspecified: Secondary | ICD-10-CM | POA: Diagnosis not present

## 2011-10-03 DIAGNOSIS — M2550 Pain in unspecified joint: Secondary | ICD-10-CM | POA: Diagnosis not present

## 2011-10-10 DIAGNOSIS — I5030 Unspecified diastolic (congestive) heart failure: Secondary | ICD-10-CM | POA: Diagnosis not present

## 2011-10-10 DIAGNOSIS — F329 Major depressive disorder, single episode, unspecified: Secondary | ICD-10-CM | POA: Diagnosis not present

## 2011-10-10 DIAGNOSIS — Z5189 Encounter for other specified aftercare: Secondary | ICD-10-CM | POA: Diagnosis not present

## 2011-10-10 DIAGNOSIS — R5383 Other fatigue: Secondary | ICD-10-CM | POA: Diagnosis not present

## 2011-10-11 DIAGNOSIS — Z5189 Encounter for other specified aftercare: Secondary | ICD-10-CM | POA: Diagnosis not present

## 2011-10-11 DIAGNOSIS — R5383 Other fatigue: Secondary | ICD-10-CM | POA: Diagnosis not present

## 2011-10-11 DIAGNOSIS — I5030 Unspecified diastolic (congestive) heart failure: Secondary | ICD-10-CM | POA: Diagnosis not present

## 2011-10-11 DIAGNOSIS — F329 Major depressive disorder, single episode, unspecified: Secondary | ICD-10-CM | POA: Diagnosis not present

## 2011-10-17 DIAGNOSIS — S32509A Unspecified fracture of unspecified pubis, initial encounter for closed fracture: Secondary | ICD-10-CM | POA: Diagnosis not present

## 2011-11-28 DIAGNOSIS — S32509A Unspecified fracture of unspecified pubis, initial encounter for closed fracture: Secondary | ICD-10-CM | POA: Diagnosis not present

## 2011-12-15 DIAGNOSIS — M79609 Pain in unspecified limb: Secondary | ICD-10-CM | POA: Diagnosis not present

## 2012-03-19 ENCOUNTER — Emergency Department (HOSPITAL_COMMUNITY): Payer: Medicare Other

## 2012-03-19 ENCOUNTER — Emergency Department (HOSPITAL_COMMUNITY)
Admission: EM | Admit: 2012-03-19 | Discharge: 2012-03-20 | Disposition: A | Discharge status: Home / Self Care | Payer: Medicare Other | Attending: Emergency Medicine | Admitting: Emergency Medicine

## 2012-03-19 ENCOUNTER — Encounter (HOSPITAL_COMMUNITY): Payer: Self-pay

## 2012-03-19 DIAGNOSIS — R1031 Right lower quadrant pain: Secondary | ICD-10-CM | POA: Insufficient documentation

## 2012-03-19 DIAGNOSIS — R42 Dizziness and giddiness: Secondary | ICD-10-CM | POA: Diagnosis not present

## 2012-03-19 DIAGNOSIS — R350 Frequency of micturition: Secondary | ICD-10-CM | POA: Insufficient documentation

## 2012-03-19 DIAGNOSIS — R079 Chest pain, unspecified: Secondary | ICD-10-CM | POA: Insufficient documentation

## 2012-03-19 DIAGNOSIS — R109 Unspecified abdominal pain: Secondary | ICD-10-CM

## 2012-03-19 DIAGNOSIS — I2510 Atherosclerotic heart disease of native coronary artery without angina pectoris: Secondary | ICD-10-CM | POA: Diagnosis not present

## 2012-03-19 DIAGNOSIS — E119 Type 2 diabetes mellitus without complications: Secondary | ICD-10-CM | POA: Insufficient documentation

## 2012-03-19 DIAGNOSIS — N289 Disorder of kidney and ureter, unspecified: Secondary | ICD-10-CM | POA: Diagnosis not present

## 2012-03-19 DIAGNOSIS — R5383 Other fatigue: Secondary | ICD-10-CM | POA: Diagnosis not present

## 2012-03-19 DIAGNOSIS — R5381 Other malaise: Secondary | ICD-10-CM | POA: Diagnosis not present

## 2012-03-19 DIAGNOSIS — I10 Essential (primary) hypertension: Secondary | ICD-10-CM | POA: Diagnosis not present

## 2012-03-19 LAB — CBC
MCH: 28.6 pg (ref 26.0–34.0)
MCHC: 33.3 g/dL (ref 30.0–36.0)
Platelets: 171 10*3/uL (ref 150–400)

## 2012-03-19 LAB — BASIC METABOLIC PANEL
Calcium: 9.6 mg/dL (ref 8.4–10.5)
Creatinine, Ser: 0.73 mg/dL (ref 0.50–1.10)
GFR calc non Af Amer: 80 mL/min — ABNORMAL LOW (ref >90)
Glucose, Bld: 86 mg/dL (ref 70–99)
Sodium: 139 mEq/L (ref 135–145)

## 2012-03-19 LAB — URINE MICROSCOPIC-ADD ON

## 2012-03-19 LAB — URINALYSIS, ROUTINE W REFLEX MICROSCOPIC
Bilirubin Urine: NEGATIVE
Ketones, ur: NEGATIVE mg/dL
Protein, ur: NEGATIVE mg/dL
Urobilinogen, UA: 0.2 mg/dL (ref 0.0–1.0)

## 2012-03-19 LAB — POCT I-STAT TROPONIN I: Troponin i, poc: 0 ng/mL (ref 0.00–0.08)

## 2012-03-19 LAB — HEPATIC FUNCTION PANEL
ALT: 13 U/L (ref 0–35)
Bilirubin, Direct: <0.1 mg/dL (ref 0.0–0.3)
Total Bilirubin: 0.4 mg/dL (ref 0.3–1.2)

## 2012-03-19 MED ORDER — IOHEXOL 300 MG/ML  SOLN
80.0000 mL | Freq: Once | INTRAMUSCULAR | Status: AC | PRN
  Administered 2012-03-19: 80 mL via INTRAVENOUS

## 2012-03-19 NOTE — ED Notes (Signed)
Pt feeling generalized weakness and dizzy in last couple week along x 2 weeks, and feeling of something moving in her bowel on RLQ. No near syncope episode reported, no N/V, no vertigo. Pt alert, oriented.

## 2012-03-19 NOTE — ED Provider Notes (Signed)
History     CSN: [id number]  Arrival date & time 03/19/12  1533   First MD Initiated Contact with Patient 03/19/12 2010      Chief Complaint  Patient presents with  . Fatigue  . Dizziness   HPI  History provided by patient, grandson and language interpreter. Patient is a 78 year old [place] female with history of hypertension and diabetes who presents with complaints of generalized weakness, intermittent right lower quadrant abdominal pain. Patient reports having general weakness for the past 2 weeks. She also reports some increased fatigue with exertion and activity. Patient denies having any fever, chills sweats. She denies any cough or hemoptysis. Patient also complains of intermittent right lower quadrant abdominal discomforts and pains. Patient does not report any specific aggravating or alleviating factors. Pains are not chain wth eating or bowel movements. Patient has had some increased urinary frequency but denies dysuria or hematuria. She denies any flank pains, nausea, vomiting, diarrhea or constipation. Patient does report some slight weight loss but does not know how much. She does not report any changes in belt size. Patient was worried for her lower abdominal discomforts because she has friends who have cancer recently. Patient has no abdominal surgical history. She denies any vaginal bleeding or discharge.    Past Medical History  Diagnosis Date  . Hypertension   . Diabetes mellitus     Past Surgical History  Procedure Date  . No past surgeries     No family history on file.  History  Substance Use Topics  . Smoking status: Never Smoker   . Smokeless tobacco: Not on file  . Alcohol Use: No    OB History    Grav Para Term Preterm Abortions TAB SAB Ect Mult Living                  Review of Systems  Constitutional: Positive for fatigue and unexpected weight change. Negative for fever, chills and appetite change.  Respiratory: Negative for cough.     Cardiovascular: Positive for chest pain. Negative for palpitations and leg swelling.  Gastrointestinal: Positive for abdominal pain. Negative for nausea, vomiting, diarrhea and constipation.  Genitourinary: Positive for frequency. Negative for dysuria, hematuria, flank pain, vaginal bleeding and vaginal discharge.  Neurological: Positive for weakness. Negative for dizziness, syncope and light-headedness.    Allergies  Review of patient's allergies indicates no known allergies.  Home Medications   Current Outpatient Rx  Name Route Sig Dispense Refill  . ACETAMINOPHEN 500 MG PO TABS Oral Take 1,000 mg by mouth every 6 (six) hours as needed. For pain      . FUROSEMIDE 40 MG PO TABS Oral Take 40 mg by mouth daily.    Marland Kitchen LOSARTAN POTASSIUM 25 MG PO TABS Oral Take 25 mg by mouth daily.      Marland Kitchen METFORMIN HCL ER 500 MG PO TB24 Oral Take 500 mg by mouth daily with breakfast.     . POTASSIUM CHLORIDE CRYS ER 20 MEQ PO TBCR Oral Take 20 mEq by mouth daily.      BP 156/86  Pulse 77  Temp 98 F (36.7 C) (Oral)  Resp 16  SpO2 96%  Physical Exam  Nursing note and vitals reviewed. Constitutional: She is oriented to person, place, and time. She appears well-developed and well-nourished. No distress.  HENT:  Head: Normocephalic.  Neck: Normal range of motion. Neck supple.  Cardiovascular: Normal rate and regular rhythm.   No murmur heard. Pulmonary/Chest: Effort normal and breath sounds  normal. No respiratory distress. She has no wheezes. She has no rales.  Abdominal: Soft. She exhibits no distension and no mass. There is no hepatosplenomegaly. There is tenderness in the right lower quadrant. There is no rebound, no guarding, no CVA tenderness, no tenderness at McBurney's point and negative Murphy's sign.       Very mild tenderness in right lower quadrant.  Lymphadenopathy:    She has no cervical adenopathy.  Neurological: She is alert and oriented to person, place, and time.  Skin: Skin is  warm and dry.  Psychiatric: She has a normal mood and affect. Her behavior is normal.    ED Course  Procedures  Results for orders placed during the hospital encounter of 03/19/12  CBC      Component Value Range   WBC 5.1  4.0 - 10.5 K/uL   RBC 4.54  3.87 - 5.11 MIL/uL   Hemoglobin 13.0  12.0 - 15.0 g/dL   HCT 40.9  81.1 - 91.4 %   MCV 85.9  78.0 - 100.0 fL   MCH 28.6  26.0 - 34.0 pg   MCHC 33.3  30.0 - 36.0 g/dL   RDW 78.2  95.6 - 21.3 %   Platelets 171  150 - 400 K/uL  BASIC METABOLIC PANEL      Component Value Range   Sodium 139  135 - 145 mEq/L   Potassium 3.9  3.5 - 5.1 mEq/L   Chloride 102  96 - 112 mEq/L   CO2 28  19 - 32 mEq/L   Glucose, Bld 86  70 - 99 mg/dL   BUN 11  6 - 23 mg/dL   Creatinine, Ser 0.86  0.50 - 1.10 mg/dL   Calcium 9.6  8.4 - 57.8 mg/dL   GFR calc non Af Amer 80 (*) >90 mL/min   GFR calc Af Amer >90  >90 mL/min  POCT I-STAT TROPONIN I      Component Value Range   Troponin i, poc 0.00  0.00 - 0.08 ng/mL   Comment 3           HEPATIC FUNCTION PANEL      Component Value Range   Total Protein 7.2  6.0 - 8.3 g/dL   Albumin 3.8  3.5 - 5.2 g/dL   AST 20  0 - 37 U/L   ALT 13  0 - 35 U/L   Alkaline Phosphatase 80  39 - 117 U/L   Total Bilirubin 0.4  0.3 - 1.2 mg/dL   Bilirubin, Direct <4.6  0.0 - 0.3 mg/dL   Indirect Bilirubin NOT CALCULATED  0.3 - 0.9 mg/dL  LIPASE, BLOOD      Component Value Range   Lipase 11  11 - 59 U/L  URINALYSIS, ROUTINE W REFLEX MICROSCOPIC      Component Value Range   Color, Urine YELLOW  YELLOW   APPearance CLOUDY (*) CLEAR   Specific Gravity, Urine 1.010  1.005 - 1.030   pH 7.5  5.0 - 8.0   Glucose, UA NEGATIVE  NEGATIVE mg/dL   Hgb urine dipstick SMALL (*) NEGATIVE   Bilirubin Urine NEGATIVE  NEGATIVE   Ketones, ur NEGATIVE  NEGATIVE mg/dL   Protein, ur NEGATIVE  NEGATIVE mg/dL   Urobilinogen, UA 0.2  0.0 - 1.0 mg/dL   Nitrite NEGATIVE  NEGATIVE   Leukocytes, UA NEGATIVE  NEGATIVE  URINE MICROSCOPIC-ADD ON       Component Value Range   Squamous Epithelial / LPF FEW (*) RARE  WBC, UA 0-2  <3 WBC/hpf   RBC / HPF 0-2  <3 RBC/hpf       Ct Abdomen Pelvis W Contrast  03/19/2012  *RADIOLOGY REPORT*  Clinical Data: Generalized weakness and dizziness.  CT ABDOMEN AND PELVIS WITH CONTRAST  Technique:  Multidetector CT imaging of the abdomen and pelvis was performed following the standard protocol during bolus administration of intravenous contrast.  Contrast: 80mL OMNIPAQUE IOHEXOL 300 MG/ML  SOLN  Comparison: 10/12/2007.  Findings: Lung Bases: Stable density is present in the right middle lobe and lingula compatible with scarring and / or atelectasis. Coronary artery atherosclerosis is present. If office based assessment of coronary risk factors has not been performed, it is now recommended.  Liver:  Grossly normal.  Spleen:  Extensive splenic artery calcification near the hilum. Otherwise normal.  Gallbladder:  Normal.  Common bile duct:  Within normal limits.  Pancreas:  Atrophic.  Stable cystic lesion dorsal to the porta splenic confluence measuring 28 mm x 20 mm.  Similar differential considerations apply.  Pseudocyst, IPMN or serous cystadenoma are in the differential considerations.  Enteric duplication cyst is possible as well.  Regardless, this has benign features and has been stable dating back to 2009.  Adrenal glands:  Normal.  Kidneys:  Mild renal atrophy bilaterally.  Enhancement appears within normal limits.  Delayed excretion is normal.  Scattered tiny sub centimeter cystic lesions are present.  Stomach:  Normal.  Small bowel:  Normal.  Small bowel mesentery normal.  No adenopathy.  Colon:   Appendix not identified.  No colonic inflammatory changes.  Pelvic Genitourinary:  No free fluid.  Uterus and adnexa appear within normal limits aside from prominent left gonadal venous structures.  Bones:  Healing left obturator ring fractures.  Osteopenia.  No aggressive osseous lesions are identified.  Vasculature:  Atherosclerosis without aneurysm.  IMPRESSION: 1.  No acute abnormality. 2.  Stable cystic lesion dorsal to the porta splenic confluence compatible with a benign etiology. 3.  Tiny low density renal lesions likely representing cysts. 4.  Atherosclerosis and coronary artery disease. 5.  Healing left obturator ring fractures.   Original Report Authenticated By: Andreas Newport, M.D.    Dg Chest Port 1 View  03/19/2012  *RADIOLOGY REPORT*  Clinical Data: 78 year old female with fatigue and dizziness.  PORTABLE CHEST - 1 VIEW  Comparison: 01/26/2011.  Findings: Portable semi upright AP view 1817 hours.  Stable appearance of probable chronic scarring in the lingula.  No definite pleural effusion.  Stable cardiac size and mediastinal contours.  No pneumothorax or pulmonary edema.  No acute pulmonary opacity.  IMPRESSION: Stable. No acute cardiopulmonary abnormality.   Original Report Authenticated By: Harley Hallmark, M.D.      1. Abdominal pain       MDM  8:15PM patient seen and evaluated. Patient in no acute distress. Patient appears well and nontoxic.  Patient up walking with a cane without difficulties per baseline according to grandson.  Patient continues to do well. Patient has normal EKG. Unremarkable chest x-ray. Patient mostly complaining of abdominal symptoms. CT scan unremarkable    Date: 03/19/2012  Rate: 69  Rhythm: normal sinus rhythm  QRS Axis: normal  Intervals: normal  ST/T Wave abnormalities: normal  Conduction Disutrbances:none  Narrative Interpretation:   Old EKG Reviewed: none available     Angus Seller, [place] 03/20/12 [phone]

## 2012-03-19 NOTE — ED Notes (Signed)
[id number]<AV> Expected date:<BR> Expected time:<BR> Means of arrival:Car<BR> Comments:<BR> Hold for Haydee Monica; being sent from Dr. Pete Glatter (to fax records). Dehydration, UTI, ?early urosepsis.

## 2012-03-20 NOTE — ED Provider Notes (Signed)
History/physical exam/procedure(s) were performed by non-physician practitioner and as supervising physician I was immediately available for consultation/collaboration. I have reviewed all notes and am in agreement with care and plan.   Kelsen Celona S Bryttani Blew, MD 03/20/12 1957 

## 2012-03-25 DIAGNOSIS — Z23 Encounter for immunization: Secondary | ICD-10-CM | POA: Diagnosis not present

## 2012-03-30 ENCOUNTER — Emergency Department (HOSPITAL_BASED_OUTPATIENT_CLINIC_OR_DEPARTMENT_OTHER)
Admission: EM | Admit: 2012-03-30 | Discharge: 2012-03-31 | Disposition: A | Discharge status: Home / Self Care | Payer: Medicare Other | Attending: Emergency Medicine | Admitting: Emergency Medicine

## 2012-03-30 ENCOUNTER — Encounter (HOSPITAL_BASED_OUTPATIENT_CLINIC_OR_DEPARTMENT_OTHER): Payer: Self-pay

## 2012-03-30 ENCOUNTER — Emergency Department (HOSPITAL_BASED_OUTPATIENT_CLINIC_OR_DEPARTMENT_OTHER): Payer: Medicare Other

## 2012-03-30 DIAGNOSIS — R112 Nausea with vomiting, unspecified: Secondary | ICD-10-CM | POA: Diagnosis not present

## 2012-03-30 DIAGNOSIS — R197 Diarrhea, unspecified: Secondary | ICD-10-CM | POA: Insufficient documentation

## 2012-03-30 DIAGNOSIS — N390 Urinary tract infection, site not specified: Secondary | ICD-10-CM | POA: Insufficient documentation

## 2012-03-30 DIAGNOSIS — I10 Essential (primary) hypertension: Secondary | ICD-10-CM | POA: Insufficient documentation

## 2012-03-30 DIAGNOSIS — E119 Type 2 diabetes mellitus without complications: Secondary | ICD-10-CM | POA: Diagnosis not present

## 2012-03-30 DIAGNOSIS — R109 Unspecified abdominal pain: Secondary | ICD-10-CM | POA: Diagnosis not present

## 2012-03-30 DIAGNOSIS — Z79899 Other long term (current) drug therapy: Secondary | ICD-10-CM | POA: Insufficient documentation

## 2012-03-30 LAB — URINE MICROSCOPIC-ADD ON

## 2012-03-30 LAB — CBC WITH DIFFERENTIAL/PLATELET
Basophils Relative: 0 % (ref 0–1)
HCT: 38.6 % (ref 36.0–46.0)
Hemoglobin: 12.9 g/dL (ref 12.0–15.0)
Lymphocytes Relative: 4 % — ABNORMAL LOW (ref 12–46)
Lymphs Abs: 0.3 10*3/uL — ABNORMAL LOW (ref 0.7–4.0)
MCHC: 33.4 g/dL (ref 30.0–36.0)
Monocytes Absolute: 0.5 10*3/uL (ref 0.1–1.0)
Monocytes Relative: 5 % (ref 3–12)
Neutro Abs: 8.4 10*3/uL — ABNORMAL HIGH (ref 1.7–7.7)
Neutrophils Relative %: 91 % — ABNORMAL HIGH (ref 43–77)
RBC: 4.46 MIL/uL (ref 3.87–5.11)

## 2012-03-30 LAB — URINALYSIS, ROUTINE W REFLEX MICROSCOPIC
Bilirubin Urine: NEGATIVE
Ketones, ur: NEGATIVE mg/dL
Nitrite: NEGATIVE
Protein, ur: NEGATIVE mg/dL
pH: 6.5 (ref 5.0–8.0)

## 2012-03-30 LAB — COMPREHENSIVE METABOLIC PANEL
Alkaline Phosphatase: 81 U/L (ref 39–117)
BUN: 13 mg/dL (ref 6–23)
CO2: 30 mEq/L (ref 19–32)
Chloride: 104 mEq/L (ref 96–112)
Creatinine, Ser: 0.7 mg/dL (ref 0.50–1.10)
GFR calc Af Amer: >90 mL/min (ref >90)
GFR calc non Af Amer: 81 mL/min — ABNORMAL LOW (ref >90)
Glucose, Bld: 135 mg/dL — ABNORMAL HIGH (ref 70–99)
Potassium: 4.3 mEq/L (ref 3.5–5.1)
Total Bilirubin: 0.4 mg/dL (ref 0.3–1.2)

## 2012-03-30 LAB — LIPASE, BLOOD: Lipase: 16 U/L (ref 11–59)

## 2012-03-30 MED ORDER — ONDANSETRON HCL 4 MG/2ML IJ SOLN
4.0000 mg | Freq: Once | INTRAMUSCULAR | Status: AC
  Administered 2012-03-30: 4 mg via INTRAVENOUS
  Filled 2012-03-30: qty 2

## 2012-03-30 MED ORDER — FENTANYL CITRATE 0.05 MG/ML IJ SOLN
50.0000 ug | Freq: Once | INTRAMUSCULAR | Status: AC
  Administered 2012-03-30: 50 ug via INTRAVENOUS
  Filled 2012-03-30: qty 2

## 2012-03-30 MED ORDER — IOHEXOL 300 MG/ML  SOLN
40.0000 mL | Freq: Once | INTRAMUSCULAR | Status: AC | PRN
  Administered 2012-03-30: 40 mL via ORAL

## 2012-03-30 NOTE — ED Notes (Signed)
MD at bedside. 

## 2012-03-30 NOTE — ED Notes (Signed)
Per grand daughter pt started vomiting with abd pain x 2 hours-pt does not speak english/adult grand daugther interpreter

## 2012-03-31 MED ORDER — TRAMADOL HCL 50 MG PO TABS: 50.0000 mg | ORAL_TABLET | Freq: Four times a day (QID) | ORAL | Status: DC | PRN

## 2012-03-31 MED ORDER — IOHEXOL 300 MG/ML  SOLN: 100.0000 mL | Freq: Once | INTRAMUSCULAR | Status: DC | PRN

## 2012-03-31 MED ORDER — OXYCODONE-ACETAMINOPHEN 5-325 MG PO TABS
1.0000 | ORAL_TABLET | Freq: Once | ORAL | Status: AC
  Administered 2012-03-31: 1 via ORAL
  Filled 2012-03-31 (×2): qty 1

## 2012-03-31 MED ORDER — CEPHALEXIN 500 MG PO CAPS: 500.0000 mg | ORAL_CAPSULE | Freq: Three times a day (TID) | ORAL | Status: DC

## 2012-03-31 MED ORDER — ONDANSETRON 8 MG PO TBDP: ORAL_TABLET | ORAL | Status: DC

## 2012-03-31 NOTE — ED Notes (Signed)
Patient transported to CT 

## 2012-03-31 NOTE — ED Notes (Signed)
MD at bedside giving test results and plan of care to pts sons.  They verbalized understanding.

## 2012-03-31 NOTE — ED Provider Notes (Signed)
History     CSN: [id number]  Arrival date & time 03/30/12  2154   First MD Initiated Contact with Patient 03/30/12 2301      Chief Complaint  Patient presents with  . Emesis    (Consider location/radiation/quality/duration/timing/severity/associated sxs/prior treatment) Patient is a 78 y.o. female presenting with vomiting and abdominal pain. The history is provided by a relative and the patient.  Emesis  This is a new problem. The current episode started 1 to 2 hours ago. The problem occurs 2 to 4 times per day. The problem has not changed since onset.The emesis has an appearance of stomach contents. There has been no fever. Associated symptoms include abdominal pain and diarrhea. Pertinent negatives include no fever.  Abdominal Pain The primary symptoms of the illness include abdominal pain, vomiting and diarrhea. The primary symptoms of the illness do not include fever or shortness of breath. The current episode started 3 to 5 hours ago. The onset of the illness was sudden. The problem has not changed since onset. The abdominal pain began 3 to 5 hours ago. The pain came on suddenly. The abdominal pain has been unchanged since its onset. The abdominal pain is located in the LUQ and LLQ. The abdominal pain does not radiate. The abdominal pain is relieved by nothing. The abdominal pain is exacerbated by vomiting.  The diarrhea began today. The diarrhea is watery.  Symptoms associated with the illness do not include constipation. Significant associated medical issues include diabetes.    Past Medical History  Diagnosis Date  . Hypertension   . Diabetes mellitus     Past Surgical History  Procedure Date  . No past surgeries     No family history on file.  History  Substance Use Topics  . Smoking status: Former Games developer  . Smokeless tobacco: Not on file  . Alcohol Use: No    OB History    Grav Para Term Preterm Abortions TAB SAB Ect Mult Living                  Review of  Systems  Constitutional: Negative for fever.  Respiratory: Negative for shortness of breath.   Cardiovascular: Negative for chest pain.  Gastrointestinal: Positive for vomiting, abdominal pain and diarrhea. Negative for constipation.  All other systems reviewed and are negative.    Allergies  Review of patient's allergies indicates no known allergies.  Home Medications   Current Outpatient Rx  Name Route Sig Dispense Refill  . ACETAMINOPHEN 500 MG PO TABS Oral Take 1,000 mg by mouth every 6 (six) hours as needed. For pain      . FUROSEMIDE 40 MG PO TABS Oral Take 40 mg by mouth daily.    Marland Kitchen LOSARTAN POTASSIUM 25 MG PO TABS Oral Take 25 mg by mouth daily.      Marland Kitchen METFORMIN HCL ER 500 MG PO TB24 Oral Take 500 mg by mouth daily with breakfast.     . ONDANSETRON 8 MG PO TBDP  8mg  ODT q 8 hours prn nausea 4 tablet 0  . POTASSIUM CHLORIDE CRYS ER 20 MEQ PO TBCR Oral Take 20 mEq by mouth daily.    . TRAMADOL HCL 50 MG PO TABS Oral Take 1 tablet (50 mg total) by mouth every 6 (six) hours as needed for pain. 15 tablet 0    BP 123/78  Pulse 84  Temp 97.7 F (36.5 C) (Oral)  Resp 14  Ht 5' (1.524 m)  Wt 115 lb (52.164 kg)  BMI 22.46 kg/m2  SpO2 95%  Physical Exam  Constitutional: She is oriented to person, place, and time. She appears well-developed and well-nourished.  HENT:  Head: Normocephalic and atraumatic.  Mouth/Throat: Oropharynx is clear and moist.  Eyes: Conjunctivae normal are normal. Pupils are equal, round, and reactive to light.  Neck: Normal range of motion. Neck supple.  Cardiovascular: Normal rate and regular rhythm.   Pulmonary/Chest: Effort normal and breath sounds normal. She has no wheezes. She has no rales.  Abdominal: Soft. Bowel sounds are normal. There is no rebound and no guarding.  Musculoskeletal: Normal range of motion.  Neurological: She is alert and oriented to person, place, and time.  Skin: Skin is warm and dry.  Psychiatric: She has a normal mood  and affect.    ED Course  Procedures (including critical care time)  Labs Reviewed  CBC WITH DIFFERENTIAL - Abnormal; Notable for the following:    Neutrophils Relative 91 (*)     Neutro Abs 8.4 (*)     Lymphocytes Relative 4 (*)     Lymphs Abs 0.3 (*)     All other components within normal limits  COMPREHENSIVE METABOLIC PANEL - Abnormal; Notable for the following:    Glucose, Bld 135 (*)     GFR calc non Af Amer 81 (*)     All other components within normal limits  URINALYSIS, ROUTINE W REFLEX MICROSCOPIC - Abnormal; Notable for the following:    Hgb urine dipstick MODERATE (*)     Leukocytes, UA SMALL (*)     All other components within normal limits  URINE MICROSCOPIC-ADD ON - Abnormal; Notable for the following:    Squamous Epithelial / LPF FEW (*)     Bacteria, UA MANY (*)     All other components within normal limits  LIPASE, BLOOD  URINE CULTURE   Ct Abdomen Pelvis W Contrast  03/31/2012  *RADIOLOGY REPORT*  Clinical Data: Abdominal pain and vomiting.  CT ABDOMEN AND PELVIS WITH CONTRAST  Technique:  Multidetector CT imaging of the abdomen and pelvis was performed following the standard protocol during bolus administration of intravenous contrast.  Contrast: 40mL OMNIPAQUE IOHEXOL 300 MG/ML  SOLN  Comparison: 03/19/2012.  Findings: Stable mild scarring at both lung bases.  Atheromatous coronary artery calcifications.  Mildly enlarged heart.  Unremarkable liver, spleen, gallbladder, adrenal glands, kidneys, urinary bladder, uterus and ovaries.  No gastrointestinal abnormalities or enlarged lymph nodes.  Diffuse pancreatic atrophy is again demonstrated with small areas of cystic change in the pancreatic tail with minimal progression since 10/12/2007.  There is also no significant change in a better defined cystic structure in the portacaval region, measuring 2.9 x 2.3 cm on image number 23.  Healed left pubic bone fractures.  No acute fracture seen.  Mild lumbar and lower thoracic  spine degenerative changes.  IMPRESSION:  1.  No acute abnormality. 2.  Minimally progressive cystic changes in the tail of the pancreas since 2009. 3.  Stable previously described porta caval cystic structure. 4.  Atheromatous coronary artery calcifications.   Original Report Authenticated By: Darrol Angel, M.D.      1. Nausea & vomiting   2. Diarrhea       MDM  Will treat for UTI, incidental finding given n/v/d instructions.  Follow up with your family doctor return for any worsening symptoms        Spyridon Hornstein K Rozlynn Lippold-Rasch, MD 03/31/12 [phone]

## 2012-04-02 LAB — URINE CULTURE

## 2012-04-03 NOTE — ED Notes (Signed)
+   urine Patient treated with keflex-sensitive to same-chart appended per protocol MD. 

## 2012-04-10 DIAGNOSIS — R609 Edema, unspecified: Secondary | ICD-10-CM | POA: Diagnosis not present

## 2012-04-10 DIAGNOSIS — I10 Essential (primary) hypertension: Secondary | ICD-10-CM | POA: Diagnosis not present

## 2012-04-10 DIAGNOSIS — IMO0001 Reserved for inherently not codable concepts without codable children: Secondary | ICD-10-CM | POA: Diagnosis not present

## 2012-04-10 DIAGNOSIS — M109 Gout, unspecified: Secondary | ICD-10-CM | POA: Diagnosis not present

## 2012-04-10 DIAGNOSIS — H6120 Impacted cerumen, unspecified ear: Secondary | ICD-10-CM | POA: Diagnosis not present

## 2012-04-10 DIAGNOSIS — R05 Cough: Secondary | ICD-10-CM | POA: Diagnosis not present

## 2012-04-10 DIAGNOSIS — R059 Cough, unspecified: Secondary | ICD-10-CM | POA: Diagnosis not present

## 2012-06-16 ENCOUNTER — Encounter (HOSPITAL_COMMUNITY): Payer: Self-pay | Admitting: Emergency Medicine

## 2012-06-16 ENCOUNTER — Emergency Department (HOSPITAL_COMMUNITY)
Admission: EM | Admit: 2012-06-16 | Discharge: 2012-06-16 | Disposition: A | Discharge status: Home / Self Care | Payer: Medicare Other | Attending: Emergency Medicine | Admitting: Emergency Medicine

## 2012-06-16 ENCOUNTER — Emergency Department (HOSPITAL_COMMUNITY): Payer: Medicare Other

## 2012-06-16 DIAGNOSIS — Z79899 Other long term (current) drug therapy: Secondary | ICD-10-CM | POA: Diagnosis not present

## 2012-06-16 DIAGNOSIS — R0789 Other chest pain: Secondary | ICD-10-CM | POA: Insufficient documentation

## 2012-06-16 DIAGNOSIS — E119 Type 2 diabetes mellitus without complications: Secondary | ICD-10-CM | POA: Diagnosis not present

## 2012-06-16 DIAGNOSIS — I10 Essential (primary) hypertension: Secondary | ICD-10-CM | POA: Diagnosis not present

## 2012-06-16 DIAGNOSIS — M79609 Pain in unspecified limb: Secondary | ICD-10-CM | POA: Insufficient documentation

## 2012-06-16 DIAGNOSIS — L0390 Cellulitis, unspecified: Secondary | ICD-10-CM | POA: Insufficient documentation

## 2012-06-16 DIAGNOSIS — L03119 Cellulitis of unspecified part of limb: Secondary | ICD-10-CM | POA: Diagnosis not present

## 2012-06-16 DIAGNOSIS — M19079 Primary osteoarthritis, unspecified ankle and foot: Secondary | ICD-10-CM | POA: Diagnosis not present

## 2012-06-16 DIAGNOSIS — Z87891 Personal history of nicotine dependence: Secondary | ICD-10-CM | POA: Diagnosis not present

## 2012-06-16 DIAGNOSIS — Z862 Personal history of diseases of the blood and blood-forming organs and certain disorders involving the immune mechanism: Secondary | ICD-10-CM | POA: Insufficient documentation

## 2012-06-16 DIAGNOSIS — M899 Disorder of bone, unspecified: Secondary | ICD-10-CM | POA: Diagnosis not present

## 2012-06-16 DIAGNOSIS — L0291 Cutaneous abscess, unspecified: Secondary | ICD-10-CM | POA: Diagnosis not present

## 2012-06-16 DIAGNOSIS — Z8739 Personal history of other diseases of the musculoskeletal system and connective tissue: Secondary | ICD-10-CM | POA: Insufficient documentation

## 2012-06-16 DIAGNOSIS — Z8639 Personal history of other endocrine, nutritional and metabolic disease: Secondary | ICD-10-CM | POA: Insufficient documentation

## 2012-06-16 DIAGNOSIS — M949 Disorder of cartilage, unspecified: Secondary | ICD-10-CM | POA: Diagnosis not present

## 2012-06-16 DIAGNOSIS — L02619 Cutaneous abscess of unspecified foot: Secondary | ICD-10-CM | POA: Diagnosis not present

## 2012-06-16 DIAGNOSIS — M25559 Pain in unspecified hip: Secondary | ICD-10-CM | POA: Diagnosis not present

## 2012-06-16 HISTORY — DX: Gout, unspecified: M10.9

## 2012-06-16 LAB — BASIC METABOLIC PANEL
BUN: 12 mg/dL (ref 6–23)
Calcium: 9.5 mg/dL (ref 8.4–10.5)
GFR calc Af Amer: >90 mL/min (ref >90)
GFR calc non Af Amer: 81 mL/min — ABNORMAL LOW (ref >90)
Glucose, Bld: 106 mg/dL — ABNORMAL HIGH (ref 70–99)
Sodium: 139 mEq/L (ref 135–145)

## 2012-06-16 LAB — CBC
HCT: 38.8 % (ref 36.0–46.0)
Hemoglobin: 13.2 g/dL (ref 12.0–15.0)
MCH: 28.9 pg (ref 26.0–34.0)
MCHC: 34 g/dL (ref 30.0–36.0)
RDW: 13.9 % (ref 11.5–15.5)

## 2012-06-16 MED ORDER — HYDROCODONE-ACETAMINOPHEN 5-325 MG PO TABS: 1.0000 | ORAL_TABLET | Freq: Four times a day (QID) | ORAL | Status: DC | PRN

## 2012-06-16 MED ORDER — CEPHALEXIN 500 MG PO CAPS: 500.0000 mg | ORAL_CAPSULE | Freq: Four times a day (QID) | ORAL | Status: DC

## 2012-06-16 MED ORDER — HYDROCODONE-ACETAMINOPHEN 5-325 MG PO TABS
1.0000 | ORAL_TABLET | Freq: Once | ORAL | Status: AC
  Administered 2012-06-16: 1 via ORAL
  Filled 2012-06-16: qty 1

## 2012-06-16 MED ORDER — CEPHALEXIN 500 MG PO CAPS
500.0000 mg | ORAL_CAPSULE | Freq: Once | ORAL | Status: AC
  Administered 2012-06-16: 500 mg via ORAL
  Filled 2012-06-16: qty 1

## 2012-06-16 NOTE — ED Notes (Signed)
Per EMS-pt c/oof right hip and leg pain x4 days. No trauma/fall. Pt had fall one year ago. Pt speaks no [place]. Hx Gout & Diabetes.

## 2012-06-16 NOTE — ED Notes (Signed)
[id number]<AV> Expected date:06/16/12<BR> Expected time: 7:29 AM<BR> Means of arrival:<BR> Comments:<BR> 79 F right hip pain No trauma

## 2012-06-16 NOTE — ED Provider Notes (Signed)
History     CSN: [id number]  Arrival date & time 06/16/12  4098   First MD Initiated Contact with Patient 06/16/12 [phone]      Chief Complaint  Patient presents with  . Hip Pain  . Leg Pain     HPI Comments: She did fall a year ago and was told that she had arthritis.  She has been taking medications. Over the last 3-4 days the symptoms have been increasing. The patient came to the emergency room this morning but she called her son telling him that the pain was more severe and she is to go to the hospital.  Patient is a 78 y.o. female presenting with hip pain. The history is provided by the patient.  Hip Pain This is a new problem. The current episode started more than 2 days ago (4 days ago). The problem occurs constantly. The problem has not changed since onset.Pertinent negatives include no abdominal pain, no headaches and no shortness of breath. The symptoms are aggravated by walking. Nothing relieves the symptoms.  Whe she walks around the pain shoots up towards her chest.  It gets better with rest.  The patient's pain actually at times will start in her foot move up towards her right hip and then up to her chest. This aspect of the pain has been ongoing for about 3-4 days as well. She denies any history of heart disease. The discomfort in her chest and vague  Past Medical History  Diagnosis Date  . Hypertension   . Diabetes mellitus   . Gout     Past Surgical History  Procedure Date  . No past surgeries     No family history on file.  History  Substance Use Topics  . Smoking status: Former Games developer  . Smokeless tobacco: Not on file  . Alcohol Use: No    OB History    Grav Para Term Preterm Abortions TAB SAB Ect Mult Living                  Review of Systems  Respiratory: Negative for shortness of breath.   Gastrointestinal: Negative for abdominal pain.  Neurological: Negative for headaches.  All other systems reviewed and are negative.    Allergies  Review  of patient's allergies indicates no known allergies.  Home Medications   Current Outpatient Rx  Name  Route  Sig  Dispense  Refill  . ACETAMINOPHEN 500 MG PO TABS   Oral   Take 1,000 mg by mouth every 6 (six) hours as needed. For pain           . CEPHALEXIN 500 MG PO CAPS   Oral   Take 1 capsule (500 mg total) by mouth 3 (three) times daily.   21 capsule   0   . FUROSEMIDE 40 MG PO TABS   Oral   Take 40 mg by mouth daily.         Marland Kitchen LOSARTAN POTASSIUM 25 MG PO TABS   Oral   Take 25 mg by mouth daily.           Marland Kitchen METFORMIN HCL ER 500 MG PO TB24   Oral   Take 500 mg by mouth daily with breakfast.          . ONDANSETRON 8 MG PO TBDP      8mg  ODT q 8 hours prn nausea   4 tablet   0   . POTASSIUM CHLORIDE CRYS ER 20 MEQ PO TBCR  Oral   Take 20 mEq by mouth daily.         . TRAMADOL HCL 50 MG PO TABS   Oral   Take 1 tablet (50 mg total) by mouth every 6 (six) hours as needed for pain.   15 tablet   0     BP 143/73  Pulse 88  Temp 98.8 F (37.1 C) (Oral)  SpO2 93%  Physical Exam  Nursing note and vitals reviewed. Constitutional: No distress.       Elderly  HENT:  Head: Normocephalic and atraumatic.  Right Ear: External ear normal.  Left Ear: External ear normal.  Eyes: Conjunctivae normal are normal. Right eye exhibits no discharge. Left eye exhibits no discharge. No scleral icterus.  Neck: Neck supple. No tracheal deviation present.  Cardiovascular: Normal rate, regular rhythm and intact distal pulses.   Pulses:      Dorsalis pedis pulses are 1+ on the right side, and 1+ on the left side.  Pulmonary/Chest: Effort normal and breath sounds normal. No stridor. No respiratory distress. She has no wheezes. She has no rales.  Abdominal: Soft. Bowel sounds are normal. She exhibits no distension. There is no tenderness. There is no rebound and no guarding.  Musculoskeletal: She exhibits tenderness. She exhibits no edema.       Right hip: She exhibits  tenderness. She exhibits normal range of motion, normal strength, no swelling and no deformity.       Right knee: Normal.       Right foot: She exhibits tenderness and bony tenderness. She exhibits normal range of motion, no swelling and no deformity.       Feet:       Kyphosis of spine,  Mild erythema and increased warmth right foot, weak pulses bilaterally but palpable, no distal cyanosis, good cap refill  Neurological: She is alert. She has normal strength. No sensory deficit. Cranial nerve deficit:  no gross defecits noted. She exhibits normal muscle tone. She displays no seizure activity. Coordination normal.  Skin: Skin is warm and dry. No rash noted. She is not diaphoretic.  Psychiatric: She has a normal mood and affect.    ED Course  Procedures (including critical care time) EKG Rate 86 SINUS RHYTHM ~ normal P axis, V-rate 50- 99 NONSPECIFIC T ABNORMALITIES, ANT-LAT LEADS ~ T <-0.86mV, I aVL V2-V6 BORDERLINE PROLONGED QT INTERVAL ~ QTc >421mS Nonspecific t wave abnormalities, and qt changes are new since last tracing Labs Reviewed  BASIC METABOLIC PANEL - Abnormal; Notable for the following:    Potassium 3.2 (*)     Glucose, Bld 106 (*)     GFR calc non Af Amer 81 (*)     All other components within normal limits  CBC  POCT I-STAT TROPONIN I   Dg Hip Complete Right  06/16/2012  *RADIOLOGY REPORT*  Clinical Data: Pain.  RIGHT HIP - COMPLETE 2+ VIEW  Comparison: CT abdomen and pelvis 03/31/2012.  Findings: Remote left parasymphyseal inferior pubic ramus fractures are noted.  No acute fracture is identified.  Both hips are located.  IMPRESSION:  1.  No acute finding. 2.  Remote left parasymphyseal inferior pubic ramus fractures.   Original Report Authenticated By: Holley Dexter, M.D.    Dg Foot Complete Right  06/16/2012  *RADIOLOGY REPORT*  Clinical Data: Pain for 4 days.  No known injury.  RIGHT FOOT COMPLETE - 3+ VIEW  Comparison: Plain films 12/13/2010.  Findings: Bones  are osteopenic.  No fracture or dislocation  is identified.  Marked degenerative disease.  Soft tissue structures are unremarkable.  IMPRESSION: Osteopenia.  No acute or focal abnormality.   Original Report Authenticated By: Holley Dexter, M.D.      1. Cellulitis       MDM  Possible early cellulitis.  Gout a possibility as well but less likely. No sign of acute vascular occlusion.  Pt has been having nonspecific chest discomfort with these symptoms but this is not her primary complaint.  Doubt ACS.    Will dc home with pain medications and abx.  Stressed importance of close outpt follow up.        Celene Kras, MD 06/16/12 [phone]

## 2012-06-21 DIAGNOSIS — R609 Edema, unspecified: Secondary | ICD-10-CM | POA: Diagnosis not present

## 2012-06-21 DIAGNOSIS — M109 Gout, unspecified: Secondary | ICD-10-CM | POA: Diagnosis not present

## 2012-06-21 DIAGNOSIS — E876 Hypokalemia: Secondary | ICD-10-CM | POA: Diagnosis not present

## 2012-08-12 DIAGNOSIS — L500 Allergic urticaria: Secondary | ICD-10-CM | POA: Diagnosis not present

## 2012-10-09 DIAGNOSIS — E119 Type 2 diabetes mellitus without complications: Secondary | ICD-10-CM | POA: Diagnosis not present

## 2012-10-09 DIAGNOSIS — I5030 Unspecified diastolic (congestive) heart failure: Secondary | ICD-10-CM | POA: Diagnosis not present

## 2012-10-09 DIAGNOSIS — I10 Essential (primary) hypertension: Secondary | ICD-10-CM | POA: Diagnosis not present

## 2012-10-09 DIAGNOSIS — R300 Dysuria: Secondary | ICD-10-CM | POA: Diagnosis not present

## 2012-10-09 DIAGNOSIS — M2550 Pain in unspecified joint: Secondary | ICD-10-CM | POA: Diagnosis not present

## 2012-10-09 DIAGNOSIS — K5900 Constipation, unspecified: Secondary | ICD-10-CM | POA: Diagnosis not present

## 2012-10-09 DIAGNOSIS — E782 Mixed hyperlipidemia: Secondary | ICD-10-CM | POA: Diagnosis not present

## 2012-11-11 ENCOUNTER — Emergency Department (HOSPITAL_COMMUNITY)
Admission: EM | Admit: 2012-11-11 | Discharge: 2012-11-11 | Disposition: A | Discharge status: Home / Self Care | Payer: Medicare Other | Attending: Emergency Medicine | Admitting: Emergency Medicine

## 2012-11-11 ENCOUNTER — Emergency Department (HOSPITAL_COMMUNITY): Payer: Medicare Other

## 2012-11-11 ENCOUNTER — Encounter (HOSPITAL_COMMUNITY): Payer: Self-pay

## 2012-11-11 DIAGNOSIS — Y939 Activity, unspecified: Secondary | ICD-10-CM | POA: Insufficient documentation

## 2012-11-11 DIAGNOSIS — Z87891 Personal history of nicotine dependence: Secondary | ICD-10-CM | POA: Insufficient documentation

## 2012-11-11 DIAGNOSIS — S298XXA Other specified injuries of thorax, initial encounter: Secondary | ICD-10-CM | POA: Diagnosis not present

## 2012-11-11 DIAGNOSIS — I10 Essential (primary) hypertension: Secondary | ICD-10-CM | POA: Diagnosis not present

## 2012-11-11 DIAGNOSIS — M109 Gout, unspecified: Secondary | ICD-10-CM | POA: Insufficient documentation

## 2012-11-11 DIAGNOSIS — E119 Type 2 diabetes mellitus without complications: Secondary | ICD-10-CM | POA: Diagnosis not present

## 2012-11-11 DIAGNOSIS — W19XXXA Unspecified fall, initial encounter: Secondary | ICD-10-CM | POA: Insufficient documentation

## 2012-11-11 DIAGNOSIS — R0789 Other chest pain: Secondary | ICD-10-CM

## 2012-11-11 DIAGNOSIS — R071 Chest pain on breathing: Secondary | ICD-10-CM | POA: Diagnosis not present

## 2012-11-11 DIAGNOSIS — Z79899 Other long term (current) drug therapy: Secondary | ICD-10-CM | POA: Insufficient documentation

## 2012-11-11 DIAGNOSIS — Y929 Unspecified place or not applicable: Secondary | ICD-10-CM | POA: Insufficient documentation

## 2012-11-11 MED ORDER — IBUPROFEN 200 MG PO TABS
400.0000 mg | ORAL_TABLET | Freq: Once | ORAL | Status: AC
  Administered 2012-11-11: 400 mg via ORAL
  Filled 2012-11-11: qty 2

## 2012-11-11 MED ORDER — OXYCODONE-ACETAMINOPHEN 5-325 MG PO TABS
1.0000 | ORAL_TABLET | Freq: Once | ORAL | Status: AC
  Administered 2012-11-11: 1 via ORAL
  Filled 2012-11-11: qty 1

## 2012-11-11 NOTE — ED Notes (Signed)
Patient transported to X-ray 

## 2012-11-11 NOTE — ED Provider Notes (Signed)
History    79year-old female with left chest pain. Patient fell yesterday and struck the front part of her chest againstground. She has had persistent pain since then. Denies any shortness of breath. Denies any headache, neck or back pain. No intervention prior to arrival. Language barrier. History via Nurse, learning disability.  CSN: [id number]  Arrival date & time 11/11/12  1215   First MD Initiated Contact with Patient 11/11/12 1233      Chief Complaint  Patient presents with  . Fall    (Consider location/radiation/quality/duration/timing/severity/associated sxs/prior treatment) HPI  Past Medical History  Diagnosis Date  . Hypertension   . Diabetes mellitus   . Gout     Past Surgical History  Procedure Laterality Date  . No past surgeries      History reviewed. No pertinent family history.  History  Substance Use Topics  . Smoking status: Former Games developer  . Smokeless tobacco: Not on file  . Alcohol Use: No    OB History   Grav Para Term Preterm Abortions TAB SAB Ect Mult Living                  Review of Systems  All systems reviewed and negative, other than as noted in HPI.   Allergies  Review of patient's allergies indicates no known allergies.  Home Medications   Current Outpatient Rx  Name  Route  Sig  Dispense  Refill  . HYDROcodone-acetaminophen (NORCO) 5-325 MG per tablet   Oral   Take 1-2 tablets by mouth every 6 (six) hours as needed for pain.   16 tablet   0   . losartan (COZAAR) 25 MG tablet   Oral   Take 25 mg by mouth daily.           . metFORMIN (GLUCOPHAGE-XR) 500 MG 24 hr tablet   Oral   Take 500 mg by mouth daily with breakfast.            BP 175/95  Pulse 75  Temp(Src) 97.8 F (36.6 C) (Oral)  SpO2 95%  Physical Exam  Nursing note and vitals reviewed. Constitutional: She appears well-developed and well-nourished. No distress.  HENT:  Head: Normocephalic and atraumatic.  Eyes: Conjunctivae are normal. Right eye exhibits no  discharge. Left eye exhibits no discharge.  Neck: Neck supple.  Cardiovascular: Normal rate, regular rhythm and normal heart sounds.  Exam reveals no gallop and no friction rub.   No murmur heard. Pulmonary/Chest: Effort normal and breath sounds normal. No respiratory distress. She exhibits tenderness.  Tenderness to palpation of the left anterior chest wall. No concerning skin lesions. No crepitus. Lungs are clear bilaterally with symmetric chest rise.  Abdominal: Soft. She exhibits no distension. There is no tenderness.  Musculoskeletal: She exhibits no edema and no tenderness.  Neurological: She is alert.  Skin: Skin is warm and dry.  Psychiatric: She has a normal mood and affect. Her behavior is normal. Thought content normal.    ED Course  Procedures (including critical care time)  Labs Reviewed - No data to display No results found.  Dg Ribs Unilateral W/chest Left  11/11/2012   *RADIOLOGY REPORT*  Clinical Data: Fall  LEFT RIBS AND CHEST - 3+ VIEW  Comparison: 03/19/2012  Findings: Mild cardiomegaly.  Clear lungs.  Mild scoliosis unchanged.  Osteopenia.  Rib deformities bilaterally have a chronic appearance. No definite acute rib fracture.  No pneumothorax.  IMPRESSION: No active cardiopulmonary disease.  No acute rib fracture.   Original Report Authenticated By:  Jolaine Click, M.D.    1. Chest wall pain       MDM  79 year old female with chest wall pain after mechanical fall. Imaging negative for acute osseous injury or pneumothorax. Plan symptomatic treatment at this time. Return precautions were discussed.        Raeford Razor, MD 11/13/12 [phone]

## 2012-11-11 NOTE — ED Notes (Signed)
With assistance from her granddaughter, she tells [place] that she tripped and fell in her home (ffell forward) yesterday evening.  She c/o ant ribs/sternal area pain.  She is alert and in no distress.

## 2013-04-08 DIAGNOSIS — I10 Essential (primary) hypertension: Secondary | ICD-10-CM | POA: Diagnosis not present

## 2013-04-08 DIAGNOSIS — R609 Edema, unspecified: Secondary | ICD-10-CM | POA: Diagnosis not present

## 2013-04-08 DIAGNOSIS — Z23 Encounter for immunization: Secondary | ICD-10-CM | POA: Diagnosis not present

## 2013-04-08 DIAGNOSIS — E119 Type 2 diabetes mellitus without complications: Secondary | ICD-10-CM | POA: Diagnosis not present

## 2013-04-08 DIAGNOSIS — M109 Gout, unspecified: Secondary | ICD-10-CM | POA: Diagnosis not present

## 2013-04-08 DIAGNOSIS — M79609 Pain in unspecified limb: Secondary | ICD-10-CM | POA: Diagnosis not present

## 2013-04-23 ENCOUNTER — Emergency Department (HOSPITAL_BASED_OUTPATIENT_CLINIC_OR_DEPARTMENT_OTHER)
Admission: EM | Admit: 2013-04-23 | Discharge: 2013-04-23 | Disposition: A | Discharge status: Home / Self Care | Payer: Medicare Other | Attending: Emergency Medicine | Admitting: Emergency Medicine

## 2013-04-23 ENCOUNTER — Encounter (HOSPITAL_BASED_OUTPATIENT_CLINIC_OR_DEPARTMENT_OTHER): Payer: Self-pay | Admitting: Emergency Medicine

## 2013-04-23 DIAGNOSIS — I10 Essential (primary) hypertension: Secondary | ICD-10-CM | POA: Insufficient documentation

## 2013-04-23 DIAGNOSIS — Z79899 Other long term (current) drug therapy: Secondary | ICD-10-CM | POA: Diagnosis not present

## 2013-04-23 DIAGNOSIS — Z87891 Personal history of nicotine dependence: Secondary | ICD-10-CM | POA: Insufficient documentation

## 2013-04-23 DIAGNOSIS — H10211 Acute toxic conjunctivitis, right eye: Secondary | ICD-10-CM

## 2013-04-23 DIAGNOSIS — Z862 Personal history of diseases of the blood and blood-forming organs and certain disorders involving the immune mechanism: Secondary | ICD-10-CM | POA: Diagnosis not present

## 2013-04-23 DIAGNOSIS — H10219 Acute toxic conjunctivitis, unspecified eye: Secondary | ICD-10-CM | POA: Diagnosis not present

## 2013-04-23 DIAGNOSIS — E119 Type 2 diabetes mellitus without complications: Secondary | ICD-10-CM | POA: Insufficient documentation

## 2013-04-23 DIAGNOSIS — Z8639 Personal history of other endocrine, nutritional and metabolic disease: Secondary | ICD-10-CM | POA: Insufficient documentation

## 2013-04-23 MED ORDER — FLUORESCEIN SODIUM 1 MG OP STRP
1.0000 | ORAL_STRIP | Freq: Once | OPHTHALMIC | Status: AC
  Administered 2013-04-23: 1 via OPHTHALMIC
  Filled 2013-04-23: qty 1

## 2013-04-23 MED ORDER — ARTIFICIAL TEARS OP OINT
TOPICAL_OINTMENT | OPHTHALMIC | Status: DC | PRN
  Filled 2013-04-23: qty 3.5

## 2013-04-23 MED ORDER — PREDNISOLONE ACETATE 1 % OP SUSP
2.0000 [drp] | Freq: Four times a day (QID) | OPHTHALMIC | Status: DC
  Administered 2013-04-23 (×2): 2 [drp] via OPHTHALMIC
  Filled 2013-04-23: qty 5

## 2013-04-23 MED ORDER — TETRACAINE HCL 0.5 % OP SOLN
1.0000 [drp] | Freq: Once | OPHTHALMIC | Status: AC
  Administered 2013-04-23: 1 [drp] via OPHTHALMIC
  Filled 2013-04-23: qty 2

## 2013-04-23 NOTE — ED Notes (Signed)
Pt. Is in no distress with noted redness to the R eye.  Pt. Has drainage and pain with blurred vision.  Pt. Was using a bug spray on yesterday and it blew into her eye.

## 2013-04-23 NOTE — ED Notes (Signed)
This RN had extensive conversation with patient and granddaughter pertaining to meds and the importance of her following up with her eye doctor. Both patient and granddaughter verbalize understanding of the need to maintain the medicine regimen and to follow up with the opthalmologist.

## 2013-04-23 NOTE — ED Notes (Signed)
Pt's granddaughter also mentions that pts feet and lower legs have been swollen x2 months. Pt has seen her pmd for same and is on diuretics at this time.

## 2013-04-23 NOTE — ED Notes (Signed)
MD at bedside. 

## 2013-04-23 NOTE — ED Provider Notes (Signed)
CSN: [id number]     Arrival date & time 04/23/13  1955 History  This chart was scribed for Hanley Seamen, MD by Ronal Fear, ED Scribe. This patient was seen in room MH06/MH06 and the patient's care was started at 11:08 PM.    Chief Complaint  Patient presents with  . Eye Problem   The history is provided by the patient and a relative. The history is limited by a language barrier. No language interpreter was used.  HPI Comments: Margaret [name] is a 79 y.o. female who presents to the Emergency Department complaining of itching, burning eye pain with associated drainage in her right eye. 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 She got some type of insect spray in her right eye yesterday morning with resulting redness, itching and pain. Pt did not wash out the eye immediately after exposure, but did wash it today. She does not appear to [place] in any acute distress and has no other complaints. She rates her discomfort a 3/10.  Past Medical History  Diagnosis Date  . Hypertension   . Diabetes mellitus   . Gout    Past Surgical History  Procedure Laterality Date  . No past surgeries     No family history on file. History  Substance Use Topics  . Smoking status: Former Games developer  . Smokeless tobacco: Not on file  . Alcohol Use: No   OB History   Grav Para Term Preterm Abortions TAB SAB Ect Mult Living                 Review of Systems  Eyes: Positive for pain, discharge, redness and itching.  All other systems reviewed and are negative.    Allergies  Review of patient's allergies indicates no known allergies.  Home Medications   Current Outpatient Rx  Name  Route  Sig  Dispense  Refill  . HYDROcodone-acetaminophen (NORCO) 5-325 MG per tablet   Oral   Take 1-2 tablets by mouth every 6 (six) hours as needed for pain.   16 tablet   0   . losartan (COZAAR) 25 MG tablet   Oral   Take 25 mg by mouth daily.           . metFORMIN (GLUCOPHAGE-XR) 500 MG 24 hr tablet   Oral    Take 500 mg by mouth daily with breakfast.           BP 145/84  Pulse 76  Temp(Src) 98.4 F (36.9 C) (Oral)  Resp 16  Wt 114 lb (51.71 kg)  BMI 22.26 kg/m2  SpO2 96% Physical Exam  Nursing note and vitals reviewed. Constitutional: She is oriented to person, place, and time. She appears well-developed and well-nourished. No distress.  HENT:  Head: Normocephalic and atraumatic.  Eyes: EOM are normal. Right eye exhibits discharge. Right conjunctiva is injected. Right pupil is not reactive. Left pupil is not reactive.  LP and RP round and non reactive EOM intact. Right medial pterygium and right conjunctival injection with some mucoid discharge. PH of 8 in right eye. No fluorescein uptake of cornea  Neck: Neck supple. No tracheal deviation present.  Cardiovascular: Normal rate, regular rhythm and normal heart sounds.   Pulmonary/Chest: Effort normal and breath sounds normal. No respiratory distress. She has no wheezes. She has no rales. She exhibits no tenderness.  Abdominal: Soft. There is no tenderness.  Musculoskeletal: Normal range of motion.  +1 pitting edema in her lower legs  Neurological: She is alert and oriented to person, place, and time.  Skin: Skin is warm and dry.  Psychiatric: She has a normal mood and affect. Her behavior is normal.    ED Course  Procedures (including critical care time) DIAGNOSTIC STUDIES: Oxygen Saturation is 96% on RA, adequate by my interpretation.    COORDINATION OF CARE:    11:16 PM- Pt advised of plan for treatment including referral to the ophthalmologist on call and starting her on some medication tonight to treat the eye and pt agrees.   We'll treat with artificial tears hourly as needed as well as prednisolone acetate drops every 6 hours. Will refer her to the ophthalmologist on call, Dr. Luciana Axe. Because she only has one functioning IV importance of prompt followup with the specialist was emphasized repeatedly.  MDM   1.  Chemical conjunctivitis of right eye    I personally performed the services described in this documentation, which was scribed in my presence.  The recorded information has been reviewed and is accurate.    Hanley Seamen, MD 04/23/13 [phone]

## 2013-04-24 DIAGNOSIS — H11029 Central pterygium of unspecified eye: Secondary | ICD-10-CM | POA: Diagnosis not present

## 2013-04-24 DIAGNOSIS — H4720 Unspecified optic atrophy: Secondary | ICD-10-CM | POA: Diagnosis not present

## 2013-04-24 DIAGNOSIS — Z961 Presence of intraocular lens: Secondary | ICD-10-CM | POA: Diagnosis not present

## 2013-04-24 DIAGNOSIS — T2650XA Corrosion of unspecified eyelid and periocular area, initial encounter: Secondary | ICD-10-CM | POA: Diagnosis not present

## 2013-06-21 ENCOUNTER — Inpatient Hospital Stay (HOSPITAL_COMMUNITY): Payer: Medicare Other

## 2013-06-21 ENCOUNTER — Emergency Department (HOSPITAL_COMMUNITY): Payer: Medicare Other

## 2013-06-21 ENCOUNTER — Encounter (HOSPITAL_COMMUNITY): Payer: Self-pay | Admitting: Emergency Medicine

## 2013-06-21 ENCOUNTER — Inpatient Hospital Stay (HOSPITAL_COMMUNITY)
Admission: EM | Admit: 2013-06-21 | Discharge: 2013-06-24 | DRG: 312 | Disposition: A | Discharge status: Skilled Nursing Facility | Payer: Medicare Other | Attending: Internal Medicine | Admitting: Internal Medicine

## 2013-06-21 ENCOUNTER — Emergency Department (HOSPITAL_COMMUNITY)
Admission: EM | Admit: 2013-06-21 | Discharge: 2013-06-21 | Disposition: A | Discharge status: Home / Self Care | Payer: Self-pay

## 2013-06-21 DIAGNOSIS — R55 Syncope and collapse: Principal | ICD-10-CM | POA: Diagnosis present

## 2013-06-21 DIAGNOSIS — S22009A Unspecified fracture of unspecified thoracic vertebra, initial encounter for closed fracture: Secondary | ICD-10-CM | POA: Diagnosis present

## 2013-06-21 DIAGNOSIS — I5022 Chronic systolic (congestive) heart failure: Secondary | ICD-10-CM | POA: Diagnosis not present

## 2013-06-21 DIAGNOSIS — M109 Gout, unspecified: Secondary | ICD-10-CM | POA: Diagnosis present

## 2013-06-21 DIAGNOSIS — E139 Other specified diabetes mellitus without complications: Secondary | ICD-10-CM

## 2013-06-21 DIAGNOSIS — I10 Essential (primary) hypertension: Secondary | ICD-10-CM | POA: Diagnosis not present

## 2013-06-21 DIAGNOSIS — I5032 Chronic diastolic (congestive) heart failure: Secondary | ICD-10-CM | POA: Diagnosis present

## 2013-06-21 DIAGNOSIS — I517 Cardiomegaly: Secondary | ICD-10-CM | POA: Diagnosis not present

## 2013-06-21 DIAGNOSIS — E089 Diabetes mellitus due to underlying condition without complications: Secondary | ICD-10-CM

## 2013-06-21 DIAGNOSIS — IMO0002 Reserved for concepts with insufficient information to code with codable children: Secondary | ICD-10-CM | POA: Diagnosis not present

## 2013-06-21 DIAGNOSIS — W19XXXA Unspecified fall, initial encounter: Secondary | ICD-10-CM | POA: Diagnosis present

## 2013-06-21 DIAGNOSIS — S0990XA Unspecified injury of head, initial encounter: Secondary | ICD-10-CM | POA: Diagnosis not present

## 2013-06-21 DIAGNOSIS — D72829 Elevated white blood cell count, unspecified: Secondary | ICD-10-CM | POA: Diagnosis present

## 2013-06-21 DIAGNOSIS — S199XXA Unspecified injury of neck, initial encounter: Secondary | ICD-10-CM | POA: Diagnosis not present

## 2013-06-21 DIAGNOSIS — S0100XA Unspecified open wound of scalp, initial encounter: Secondary | ICD-10-CM | POA: Diagnosis not present

## 2013-06-21 DIAGNOSIS — E119 Type 2 diabetes mellitus without complications: Secondary | ICD-10-CM | POA: Diagnosis present

## 2013-06-21 DIAGNOSIS — S0993XA Unspecified injury of face, initial encounter: Secondary | ICD-10-CM | POA: Diagnosis not present

## 2013-06-21 DIAGNOSIS — Z87891 Personal history of nicotine dependence: Secondary | ICD-10-CM | POA: Diagnosis not present

## 2013-06-21 DIAGNOSIS — Z23 Encounter for immunization: Secondary | ICD-10-CM

## 2013-06-21 DIAGNOSIS — R51 Headache: Secondary | ICD-10-CM | POA: Diagnosis not present

## 2013-06-21 DIAGNOSIS — S22080A Wedge compression fracture of T11-T12 vertebra, initial encounter for closed fracture: Secondary | ICD-10-CM

## 2013-06-21 DIAGNOSIS — M549 Dorsalgia, unspecified: Secondary | ICD-10-CM | POA: Diagnosis not present

## 2013-06-21 DIAGNOSIS — Z8673 Personal history of transient ischemic attack (TIA), and cerebral infarction without residual deficits: Secondary | ICD-10-CM | POA: Diagnosis not present

## 2013-06-21 DIAGNOSIS — R918 Other nonspecific abnormal finding of lung field: Secondary | ICD-10-CM | POA: Diagnosis not present

## 2013-06-21 DIAGNOSIS — E860 Dehydration: Secondary | ICD-10-CM | POA: Diagnosis present

## 2013-06-21 DIAGNOSIS — Z9181 History of falling: Secondary | ICD-10-CM | POA: Diagnosis not present

## 2013-06-21 DIAGNOSIS — M545 Low back pain, unspecified: Secondary | ICD-10-CM | POA: Diagnosis not present

## 2013-06-21 DIAGNOSIS — T1490XA Injury, unspecified, initial encounter: Secondary | ICD-10-CM | POA: Diagnosis not present

## 2013-06-21 DIAGNOSIS — M6281 Muscle weakness (generalized): Secondary | ICD-10-CM | POA: Diagnosis not present

## 2013-06-21 DIAGNOSIS — Z79899 Other long term (current) drug therapy: Secondary | ICD-10-CM

## 2013-06-21 DIAGNOSIS — Z5189 Encounter for other specified aftercare: Secondary | ICD-10-CM | POA: Diagnosis not present

## 2013-06-21 DIAGNOSIS — I110 Hypertensive heart disease with heart failure: Secondary | ICD-10-CM

## 2013-06-21 DIAGNOSIS — I509 Heart failure, unspecified: Secondary | ICD-10-CM | POA: Diagnosis not present

## 2013-06-21 HISTORY — DX: Type 2 diabetes mellitus without complications: E11.9

## 2013-06-21 HISTORY — DX: Hypertensive heart disease with heart failure: I11.0

## 2013-06-21 HISTORY — DX: Syncope and collapse: R55

## 2013-06-21 HISTORY — DX: Diabetes mellitus due to underlying condition without complications: E08.9

## 2013-06-21 LAB — PRO B NATRIURETIC PEPTIDE: Pro B Natriuretic peptide (BNP): 157.5 pg/mL (ref 0–450)

## 2013-06-21 LAB — URINALYSIS, ROUTINE W REFLEX MICROSCOPIC
Glucose, UA: NEGATIVE mg/dL
Leukocytes, UA: NEGATIVE
Protein, ur: NEGATIVE mg/dL
Urobilinogen, UA: 0.2 mg/dL (ref 0.0–1.0)

## 2013-06-21 LAB — COMPREHENSIVE METABOLIC PANEL
Albumin: 4.1 g/dL (ref 3.5–5.2)
BUN: 16 mg/dL (ref 6–23)
Calcium: 9.3 mg/dL (ref 8.4–10.5)
Chloride: 99 mEq/L (ref 96–112)
Creatinine, Ser: 0.7 mg/dL (ref 0.50–1.10)
Total Bilirubin: 0.4 mg/dL (ref 0.3–1.2)

## 2013-06-21 LAB — GLUCOSE, CAPILLARY: Glucose-Capillary: 126 mg/dL — ABNORMAL HIGH (ref 70–99)

## 2013-06-21 LAB — CBC
HCT: 39.5 % (ref 36.0–46.0)
MCH: 29.2 pg (ref 26.0–34.0)
MCHC: 33.7 g/dL (ref 30.0–36.0)
MCV: 86.6 fL (ref 78.0–100.0)
RDW: 14 % (ref 11.5–15.5)

## 2013-06-21 LAB — URINE MICROSCOPIC-ADD ON

## 2013-06-21 MED ORDER — MORPHINE SULFATE 2 MG/ML IJ SOLN
0.5000 mg | INTRAMUSCULAR | Status: DC | PRN
  Administered 2013-06-21 – 2013-06-22 (×2): 1 mg via INTRAVENOUS
  Filled 2013-06-21 (×2): qty 1

## 2013-06-21 MED ORDER — SODIUM CHLORIDE 0.9 % IJ SOLN
3.0000 mL | Freq: Two times a day (BID) | INTRAMUSCULAR | Status: DC
  Administered 2013-06-22: 22:00:00 3 mL via INTRAVENOUS

## 2013-06-21 MED ORDER — ONDANSETRON HCL 4 MG/2ML IJ SOLN
4.0000 mg | Freq: Four times a day (QID) | INTRAMUSCULAR | Status: DC | PRN
Start: 2013-06-21 — End: 2013-06-24
  Administered 2013-06-21: 4 mg via INTRAVENOUS
  Filled 2013-06-21 (×2): qty 2

## 2013-06-21 MED ORDER — SODIUM CHLORIDE 0.9 % IV SOLN
INTRAVENOUS | Status: DC
  Administered 2013-06-22: 18:00:00 50 mL/h via INTRAVENOUS
  Administered 2013-06-22 – 2013-06-23 (×2): via INTRAVENOUS

## 2013-06-21 MED ORDER — ONDANSETRON HCL 4 MG PO TABS: 4.0000 mg | ORAL_TABLET | Freq: Four times a day (QID) | ORAL | Status: DC | PRN

## 2013-06-21 MED ORDER — ASPIRIN 81 MG PO CHEW
81.0000 mg | CHEWABLE_TABLET | Freq: Every day | ORAL | Status: DC
  Administered 2013-06-21 – 2013-06-24 (×4): 81 mg via ORAL
  Filled 2013-06-21 (×4): qty 1

## 2013-06-21 MED ORDER — MORPHINE SULFATE 4 MG/ML IJ SOLN
4.0000 mg | Freq: Once | INTRAMUSCULAR | Status: AC
  Administered 2013-06-21: 4 mg via INTRAVENOUS
  Filled 2013-06-21: qty 1

## 2013-06-21 MED ORDER — ONDANSETRON HCL 4 MG/2ML IJ SOLN: 4.0000 mg | Freq: Four times a day (QID) | INTRAMUSCULAR | Status: DC | PRN

## 2013-06-21 MED ORDER — INSULIN ASPART 100 UNIT/ML ~~LOC~~ SOLN
0.0000 [IU] | Freq: Three times a day (TID) | SUBCUTANEOUS | Status: DC
  Administered 2013-06-22 (×2): 1 [IU] via SUBCUTANEOUS
  Administered 2013-06-23 (×2): 2 [IU] via SUBCUTANEOUS
  Administered 2013-06-24: 13:00:00 1 [IU] via SUBCUTANEOUS

## 2013-06-21 MED ORDER — LOSARTAN POTASSIUM 25 MG PO TABS
25.0000 mg | ORAL_TABLET | Freq: Every day | ORAL | Status: DC
  Administered 2013-06-21 – 2013-06-24 (×4): 25 mg via ORAL
  Filled 2013-06-21 (×4): qty 1

## 2013-06-21 MED ORDER — HYDRALAZINE HCL 20 MG/ML IJ SOLN
2.0000 mg | Freq: Three times a day (TID) | INTRAMUSCULAR | Status: DC | PRN
  Filled 2013-06-21: qty 0.1

## 2013-06-21 MED ORDER — ENOXAPARIN SODIUM 40 MG/0.4ML ~~LOC~~ SOLN
40.0000 mg | SUBCUTANEOUS | Status: DC
  Administered 2013-06-21 – 2013-06-23 (×3): 40 mg via SUBCUTANEOUS
  Filled 2013-06-21 (×4): qty 0.4

## 2013-06-21 MED ORDER — SODIUM CHLORIDE 0.9 % IV BOLUS (SEPSIS)
1000.0000 mL | Freq: Once | INTRAVENOUS | Status: AC
  Administered 2013-06-21: 1000 mL via INTRAVENOUS

## 2013-06-21 MED ORDER — TRAMADOL-ACETAMINOPHEN 37.5-325 MG PO TABS
1.0000 | ORAL_TABLET | ORAL | Status: DC | PRN
  Administered 2013-06-23: 2 via ORAL
  Filled 2013-06-21: qty 2

## 2013-06-21 NOTE — H&P (Signed)
[facility] History and Physical  Margaret [name] XLK:[id number] DOB: 06-Jul-1931 DOA: 06/21/2013  Referring physician: EDP PCP: Margaret [name]  Chief Complaint: UNWITNESSED FALL AND SYNCOPE AT 1 PM ON 12/26  HPI: Margaret [name] is a 79 y.o. female with prior h/o hypertension, diabetes mellitus, was brought in by her grand daughter to ED, after a fall at home. Pt speaks vietnamese and with the help of translator, pt felt dizzy and felt as if pushed from the back , became unsteady and fell on the floor and hit her head. She reports losing consciousness and after which she reports calling the grand daughter. She reports occasional headache, and dizziness, reports multiple fall sin the past. She also reports occasional burning micturition in the past. She denies fever or chills. On arrival to ED, CT Head shows old lacunar infarcts and small vessel disease. Lumbar spine imaging shows t12 fracture, neurosurgery was consulted by EDP recommended TLS O BRACE and pain medications. She is referred to medical service for admission for evaluation of syncope.     Review of Systems:  Constitutional:  No weight loss, night sweats, Fevers, chills, fatigue.  Cardio-vascular:  No chest pain, Orthopnea, PND, swelling in lower extremities, anasarca, palpitations  GI:  No heartburn, indigestion, abdominal pain, nausea, vomiting, diarrhea, change in bowel habits, loss of appetite  Resp:  No shortness of breath with exertion or at rest. No excess mucus, no productive cough, No non-productive cough, No coughing up of blood.No change in color of mucus.No wheezing.No chest wall deformity  Skin:  no rash or lesions.  GU:  no dysuria, change in color of urine, no urgency or frequency. No flank pain.  Musculoskeletal:  No joint pain or swelling. No decreased range of motion. No back pain.  Psych:  No change in mood or affect. No depression or anxiety. No memory loss.   Past Medical History  Diagnosis Date   . Hypertension   . Diabetes mellitus   . Gout    Past Surgical History  Procedure Laterality Date  . No past surgeries     Social History:  reports that she has quit smoking. She does not have any smokeless tobacco history on file. She reports that she does not drink alcohol or use illicit drugs.  No Known Allergies  No family history on file.   Prior to Admission medications   Medication Sig Start Date End Date Taking? Authorizing Provider  furosemide (LASIX) 40 MG tablet Take 40 mg by mouth daily.  04/08/13  Yes Historical Provider, MD  losartan (COZAAR) 25 MG tablet Take 25 mg by mouth daily.     Yes Historical Provider, MD  metFORMIN (GLUCOPHAGE-XR) 500 MG 24 hr tablet Take 500 mg by mouth every evening.    Yes Historical Provider, MD   Physical Exam: Filed Vitals:   06/21/13 1432  BP: 184/81  Pulse: 82  Temp: 97.6 F (36.4 C)  Resp: 16    BP 184/81  Pulse 82  Temp(Src) 97.6 F (36.4 C) (Oral)  Resp 16  SpO2 92%  General:  Appears calm and comfortable Head Eyes: PERRL, normal lids, irises & conjunctiva,  Neck: no LAD, masses or thyromegaly Cardiovascular: RRR, no m/r/g. No Krizek edema. Telemetry: SR, no arrhythmias  Respiratory: CTA bilaterally, no w/r/r. Normal respiratory effort. Abdomen: soft, ntnd Skin: no rash or induration seen on limited exam Musculoskeletal: grossly normal tone BUE/BLE Neurologic: grossly non-focal.          Labs on Admission:  Basic Metabolic Panel:  Recent Labs Lab 06/21/13 1640  NA 136  K 4.3  CL 99  CO2 26  GLUCOSE 129*  BUN 16  CREATININE 0.70  CALCIUM 9.3   Liver Function Tests:  Recent Labs Lab 06/21/13 1640  AST 28  ALT 19  ALKPHOS 93  BILITOT 0.4  PROT 7.5  ALBUMIN 4.1   No results found for this basename: LIPASE, AMYLASE,  in the last 168 hours No results found for this basename: AMMONIA,  in the last 168 hours CBC:  Recent Labs Lab 06/21/13 1640  WBC 10.9*  HGB 13.3  HCT 39.5  MCV 86.6  PLT  160   Cardiac Enzymes:  Recent Labs Lab 06/21/13 1640  TROPONINI <0.30    BNP (last 3 results) No results found for this basename: PROBNP,  in the last 8760 hours CBG:  Recent Labs Lab 06/21/13 1640  GLUCAP 108*    Radiological Exams on Admission: Dg Lumbar Spine Complete  06/21/2013   CLINICAL DATA:  Low back pain.  Fall.  EXAM: LUMBAR SPINE - COMPLETE 4+ VIEW  COMPARISON:  CT 03/31/2012.  FINDINGS: T12 compression fracture is present with 50% loss of vertebral body height. Minimal retropulsion of the superior endplate. This appears new compared to the prior CT of 2013. Aortoiliac atherosclerosis. Osteopenia. Lumbar vertebral body height is preserved. There are 5 lumbar type vertebral bodies. Levoconvex curve of the lumbar spine.  IMPRESSION: Likely acute T12 compression fracture with 50% loss of vertebral body height and mild retropulsion. Underlying osteopenia.   Electronically Signed   By: Andreas Newport M.D.   On: 06/21/2013 16:19   Dg Pelvis 1-2 Views  06/21/2013   CLINICAL DATA:  Fall.  Right-sided pain.  EXAM: PELVIS - 1-2 VIEW  COMPARISON:  CT 03/31/2012.  FINDINGS: Old healed left obturator ring fracture is present. Dense phlebolith in the left anatomic pelvis. Osteopenia. There is no displaced acute fracture identified on the single view of the pelvis. Left obturator ring fracture was demonstrated on prior CT.  IMPRESSION: 1. Osteopenia without an acute osseous injury. 2. Old left obturator ring fracture, healed.   Electronically Signed   By: Andreas Newport M.D.   On: 06/21/2013 16:17   Ct Head Wo Contrast  06/21/2013   CLINICAL DATA:  Status post fall  EXAM: CT HEAD WITHOUT CONTRAST  CT CERVICAL SPINE WITHOUT CONTRAST  TECHNIQUE: Multidetector CT imaging of the head and cervical spine was performed following the standard protocol without intravenous contrast. Multiplanar CT image reconstructions of the cervical spine were also generated.  COMPARISON:  CT scan of the brain  and cervical spine dated October 12, 2007.  FINDINGS: CT HEAD FINDINGS  There is mild diffuse cerebral and cerebellar atrophy with compensatory ventriculomegaly. These findings appear stable. There is an old lacunar infarction in the region of the genu of the internal capsule on the right. In the anterior aspect of the basal ganglia on the right there is evidence of an old lacunar infarction as well. Small lacunar infarctions in the periphery of the left basal ganglia are demonstrated. There is decreased density in the deep white matter of both cerebral hemispheres consistent with chronic small vessel ischemic type change. There is no shift of the midline. There is no evidence of an acute intracranial hemorrhage. There is no evidence of an evolving ischemic infarction. The cerebellum and brainstem exhibit no acute abnormalities.  At bone window settings the observed portions of the paranasal sinuses exhibit no air-fluid levels.  The mastoid air cells appear somewhat hypoplastic bilaterally. There are postsurgical changes in the right mastoid bone. There is no evidence of an acute skull fracture. There is no definite cephalohematoma.  CT CERVICAL SPINE FINDINGS  The cervical vertebral bodies are preserved in height. There is disc space narrowing at C5-6 with a prominent anterior endplate osteophyte nearly bridging the disc space. There are small posterior osteophytes at C3-4 and at C4-5. The prevertebral soft tissue spaces appear normal. There is no evidence of a perched facet nor spinous process fracture. The bony ring at each cervical level is intact. The observed portions of the 1st and 2nd ribs appear normal. There is apical pleural scarring bilaterally. There is a stable pulmonary nodule in the left upper lobe laterally just under 7 mm in greatest dimension.  IMPRESSION: 1. There is no evidence of an acute intracranial hemorrhage nor of an evolving ischemic event within the brain. There are extensive chronic  changes consistent with small vessel ischemia and old lacunar infarctions. 2. There is no evidence of an acute cervical spine fracture nor dislocation. There is mild degenerative disc change at multiple levels which appears stable. 3. There is a stable soft tissue density nodule in the upper lobe of the left lung.   Electronically Signed   By: David  [place]   On: 06/21/2013 16:14   Ct Cervical Spine Wo Contrast  06/21/2013   CLINICAL DATA:  Status post fall  EXAM: CT HEAD WITHOUT CONTRAST  CT CERVICAL SPINE WITHOUT CONTRAST  TECHNIQUE: Multidetector CT imaging of the head and cervical spine was performed following the standard protocol without intravenous contrast. Multiplanar CT image reconstructions of the cervical spine were also generated.  COMPARISON:  CT scan of the brain and cervical spine dated October 12, 2007.  FINDINGS: CT HEAD FINDINGS  There is mild diffuse cerebral and cerebellar atrophy with compensatory ventriculomegaly. These findings appear stable. There is an old lacunar infarction in the region of the genu of the internal capsule on the right. In the anterior aspect of the basal ganglia on the right there is evidence of an old lacunar infarction as well. Small lacunar infarctions in the periphery of the left basal ganglia are demonstrated. There is decreased density in the deep white matter of both cerebral hemispheres consistent with chronic small vessel ischemic type change. There is no shift of the midline. There is no evidence of an acute intracranial hemorrhage. There is no evidence of an evolving ischemic infarction. The cerebellum and brainstem exhibit no acute abnormalities.  At bone window settings the observed portions of the paranasal sinuses exhibit no air-fluid levels. The mastoid air cells appear somewhat hypoplastic bilaterally. There are postsurgical changes in the right mastoid bone. There is no evidence of an acute skull fracture. There is no definite cephalohematoma.  CT  CERVICAL SPINE FINDINGS  The cervical vertebral bodies are preserved in height. There is disc space narrowing at C5-6 with a prominent anterior endplate osteophyte nearly bridging the disc space. There are small posterior osteophytes at C3-4 and at C4-5. The prevertebral soft tissue spaces appear normal. There is no evidence of a perched facet nor spinous process fracture. The bony ring at each cervical level is intact. The observed portions of the 1st and 2nd ribs appear normal. There is apical pleural scarring bilaterally. There is a stable pulmonary nodule in the left upper lobe laterally just under 7 mm in greatest dimension.  IMPRESSION: 1. There is no evidence of an acute intracranial hemorrhage nor  of an evolving ischemic event within the brain. There are extensive chronic changes consistent with small vessel ischemia and old lacunar infarctions. 2. There is no evidence of an acute cervical spine fracture nor dislocation. There is mild degenerative disc change at multiple levels which appears stable. 3. There is a stable soft tissue density nodule in the upper lobe of the left lung.   Electronically Signed   By: David  [place]   On: 06/21/2013 16:14    EKG: SINUS RHYTHM with left atrial enlargement.   Assessment/Plan Active Problems:   Syncope   Syncope and collapse   Leukocytosis   Hypertension   Diabetes mellitus due to underlying condition  1. Syncope: - unclear etiology  - rule out orthostatic hypotension. - orthostatic vital signs.  - CT head neg for acute stroke.  - MRI BRAIN ordered.  - EKG shows normal sinus rhythm with left atrial enlargement.  - serial troponins. - echocardiogram.  - follow up EKG in am.  - PT /OT EVAL in am.  - aspirin 81 mg daily.   2. Hypertension: - not well controlled.  - resume home medications.  - prn hydralazine   3. Diabetes mellitus; - hgba1c - SSI.  - hold metformin.   4. t12 fracture with osteopenia: - normal calcium level - get vit d  levels.   DVT prophylaxis.    Code Status: presumed full code Family Communication: daughter and grand daughter at bedside, discussed theplan of care with them Disposition Plan: admit to telemetry  Time spent: 75 min  [name] Pager [phone]

## 2013-06-21 NOTE — ED Notes (Signed)
Per EMS pt from home with c/o fall. Per granddaughter pt fell while walking to kitchen (uses walker) d/t feeling dizzy. Pt reports pain 10/10 in her lower and mid back. Pt is non-english speaking, granddaughter at bedside translates.

## 2013-06-21 NOTE — Progress Notes (Signed)
Called to get report from ED nurse. Nurse unavailable at this time. Cell # left for ED nurse to call back.

## 2013-06-21 NOTE — Progress Notes (Signed)
Called ED to get report , was placed on hold, unable to talked to Rn.

## 2013-06-21 NOTE — ED Notes (Signed)
Per EMS pt coming from home with c/o fall. EMS reports pt sts felt dizzy and fell from standing position. Pt reports pain 10/10 mid back pain. c-collar and KED applied.

## 2013-06-21 NOTE — ED Provider Notes (Signed)
CSN: [id number]     Arrival date & time 06/21/13  1427 History   First MD Initiated Contact with Patient 06/21/13 1507     Chief Complaint  Patient presents with  . Fall   (Consider location/radiation/quality/duration/timing/severity/associated sxs/prior Treatment) HPI Comments: Patient is a 79 yo F PMHx significant for HTN, DM, Gout presenting to the ED after a fall with precipitating dizziness. According to the granddaughter the patient was using her walker to ambulate into the kitchen when she complaint of filling dizzy and then fell to the ground. The fall was not witnessed, but didn't occur today. Patient is now complaining of achy sore chest pain. She is also complaining of 10-10 pain in her lower back, neck. She did have an episode of loss of consciousness for an unknown time. Patient is complaining of some mild nausea at this time. She is not on any blood thinners.  The history is provided by the patient and a relative. The history is limited by a language barrier. A language interpreter was used.    Past Medical History  Diagnosis Date  . Hypertension   . Diabetes mellitus   . Gout    Past Surgical History  Procedure Laterality Date  . No past surgeries     No family history on file. History  Substance Use Topics  . Smoking status: Former Games developer  . Smokeless tobacco: Not on file  . Alcohol Use: No   OB History   Grav Para Term Preterm Abortions TAB SAB Ect Mult Living                 Review of Systems  Respiratory: Negative for cough and shortness of breath.   Cardiovascular: Positive for chest pain.  Gastrointestinal: Positive for nausea. Negative for vomiting and abdominal pain.  Musculoskeletal: Positive for back pain and neck pain.  Neurological: Positive for dizziness and syncope. Negative for weakness and numbness.  All other systems reviewed and are negative.    Allergies  Review of patient's allergies indicates no known allergies.  Home Medications    Current Outpatient Rx  Name  Route  Sig  Dispense  Refill  . furosemide (LASIX) 40 MG tablet   Oral   Take 40 mg by mouth daily.          Marland Kitchen losartan (COZAAR) 25 MG tablet   Oral   Take 25 mg by mouth daily.           . metFORMIN (GLUCOPHAGE-XR) 500 MG 24 hr tablet   Oral   Take 500 mg by mouth every evening.           BP 184/81  Pulse 82  Temp(Src) 97.6 F (36.4 C) (Oral)  Resp 16  SpO2 92% Physical Exam  Constitutional: She appears well-developed and well-nourished. No distress. Cervical collar in place.  HENT:  Head: Normocephalic and atraumatic.  Right Ear: External ear normal.  Left Ear: External ear normal.  Nose: Nose normal.  Eyes: Conjunctivae are normal. Pupils are equal, round, and reactive to light.  Neck: Neck supple.  Cardiovascular: Normal rate, regular rhythm, normal heart sounds and intact distal pulses.   Pulmonary/Chest: Effort normal and breath sounds normal. No respiratory distress. She exhibits tenderness.  Chest tender along back brace straps.  Abdominal: Soft. Bowel sounds are normal. There is no tenderness.  Musculoskeletal: She exhibits no edema.       Cervical back: She exhibits tenderness.       Lumbar back: She exhibits tenderness  and bony tenderness.  Neurological: She is alert. She has normal strength.  No pronator drift.   Skin: Skin is warm and dry. She is not diaphoretic.    ED Course  Procedures (including critical care time) Medications  morphine 4 MG/ML injection 4 mg (4 mg Intravenous Given 06/21/13 1709)  sodium chloride 0.9 % bolus 1,000 mL (1,000 mLs Intravenous New Bag/Given 06/21/13 1700)    Labs Review Labs Reviewed  CBC - Abnormal; Notable for the following:    WBC 10.9 (*)    All other components within normal limits  COMPREHENSIVE METABOLIC PANEL - Abnormal; Notable for the following:    Glucose, Bld 129 (*)    GFR calc non Af Amer 81 (*)    All other components within normal limits  URINALYSIS, ROUTINE  W REFLEX MICROSCOPIC - Abnormal; Notable for the following:    APPearance TURBID (*)    Hgb urine dipstick SMALL (*)    All other components within normal limits  GLUCOSE, CAPILLARY - Abnormal; Notable for the following:    Glucose-Capillary 108 (*)    All other components within normal limits  URINE CULTURE  URINE MICROSCOPIC-ADD ON  TROPONIN I   Imaging Review Dg Lumbar Spine Complete  06/21/2013   CLINICAL DATA:  Low back pain.  Fall.  EXAM: LUMBAR SPINE - COMPLETE 4+ VIEW  COMPARISON:  CT 03/31/2012.  FINDINGS: T12 compression fracture is present with 50% loss of vertebral body height. Minimal retropulsion of the superior endplate. This appears new compared to the prior CT of 2013. Aortoiliac atherosclerosis. Osteopenia. Lumbar vertebral body height is preserved. There are 5 lumbar type vertebral bodies. Levoconvex curve of the lumbar spine.  IMPRESSION: Likely acute T12 compression fracture with 50% loss of vertebral body height and mild retropulsion. Underlying osteopenia.   Electronically Signed   By: Andreas Newport M.D.   On: 06/21/2013 16:19   Dg Pelvis 1-2 Views  06/21/2013   CLINICAL DATA:  Fall.  Right-sided pain.  EXAM: PELVIS - 1-2 VIEW  COMPARISON:  CT 03/31/2012.  FINDINGS: Old healed left obturator ring fracture is present. Dense phlebolith in the left anatomic pelvis. Osteopenia. There is no displaced acute fracture identified on the single view of the pelvis. Left obturator ring fracture was demonstrated on prior CT.  IMPRESSION: 1. Osteopenia without an acute osseous injury. 2. Old left obturator ring fracture, healed.   Electronically Signed   By: Andreas Newport M.D.   On: 06/21/2013 16:17   Ct Head Wo Contrast  06/21/2013   CLINICAL DATA:  Status post fall  EXAM: CT HEAD WITHOUT CONTRAST  CT CERVICAL SPINE WITHOUT CONTRAST  TECHNIQUE: Multidetector CT imaging of the head and cervical spine was performed following the standard protocol without intravenous contrast.  Multiplanar CT image reconstructions of the cervical spine were also generated.  COMPARISON:  CT scan of the brain and cervical spine dated October 12, 2007.  FINDINGS: CT HEAD FINDINGS  There is mild diffuse cerebral and cerebellar atrophy with compensatory ventriculomegaly. These findings appear stable. There is an old lacunar infarction in the region of the genu of the internal capsule on the right. In the anterior aspect of the basal ganglia on the right there is evidence of an old lacunar infarction as well. Small lacunar infarctions in the periphery of the left basal ganglia are demonstrated. There is decreased density in the deep white matter of both cerebral hemispheres consistent with chronic small vessel ischemic type change. There is no shift of  the midline. There is no evidence of an acute intracranial hemorrhage. There is no evidence of an evolving ischemic infarction. The cerebellum and brainstem exhibit no acute abnormalities.  At bone window settings the observed portions of the paranasal sinuses exhibit no air-fluid levels. The mastoid air cells appear somewhat hypoplastic bilaterally. There are postsurgical changes in the right mastoid bone. There is no evidence of an acute skull fracture. There is no definite cephalohematoma.  CT CERVICAL SPINE FINDINGS  The cervical vertebral bodies are preserved in height. There is disc space narrowing at C5-6 with a prominent anterior endplate osteophyte nearly bridging the disc space. There are small posterior osteophytes at C3-4 and at C4-5. The prevertebral soft tissue spaces appear normal. There is no evidence of a perched facet nor spinous process fracture. The bony ring at each cervical level is intact. The observed portions of the 1st and 2nd ribs appear normal. There is apical pleural scarring bilaterally. There is a stable pulmonary nodule in the left upper lobe laterally just under 7 mm in greatest dimension.  IMPRESSION: 1. There is no evidence of an  acute intracranial hemorrhage nor of an evolving ischemic event within the brain. There are extensive chronic changes consistent with small vessel ischemia and old lacunar infarctions. 2. There is no evidence of an acute cervical spine fracture nor dislocation. There is mild degenerative disc change at multiple levels which appears stable. 3. There is a stable soft tissue density nodule in the upper lobe of the left lung.   Electronically Signed   By: David  [place]   On: 06/21/2013 16:14   Ct Cervical Spine Wo Contrast  06/21/2013   CLINICAL DATA:  Status post fall  EXAM: CT HEAD WITHOUT CONTRAST  CT CERVICAL SPINE WITHOUT CONTRAST  TECHNIQUE: Multidetector CT imaging of the head and cervical spine was performed following the standard protocol without intravenous contrast. Multiplanar CT image reconstructions of the cervical spine were also generated.  COMPARISON:  CT scan of the brain and cervical spine dated October 12, 2007.  FINDINGS: CT HEAD FINDINGS  There is mild diffuse cerebral and cerebellar atrophy with compensatory ventriculomegaly. These findings appear stable. There is an old lacunar infarction in the region of the genu of the internal capsule on the right. In the anterior aspect of the basal ganglia on the right there is evidence of an old lacunar infarction as well. Small lacunar infarctions in the periphery of the left basal ganglia are demonstrated. There is decreased density in the deep white matter of both cerebral hemispheres consistent with chronic small vessel ischemic type change. There is no shift of the midline. There is no evidence of an acute intracranial hemorrhage. There is no evidence of an evolving ischemic infarction. The cerebellum and brainstem exhibit no acute abnormalities.  At bone window settings the observed portions of the paranasal sinuses exhibit no air-fluid levels. The mastoid air cells appear somewhat hypoplastic bilaterally. There are postsurgical changes in the right  mastoid bone. There is no evidence of an acute skull fracture. There is no definite cephalohematoma.  CT CERVICAL SPINE FINDINGS  The cervical vertebral bodies are preserved in height. There is disc space narrowing at C5-6 with a prominent anterior endplate osteophyte nearly bridging the disc space. There are small posterior osteophytes at C3-4 and at C4-5. The prevertebral soft tissue spaces appear normal. There is no evidence of a perched facet nor spinous process fracture. The bony ring at each cervical level is intact. The observed portions of the  1st and 2nd ribs appear normal. There is apical pleural scarring bilaterally. There is a stable pulmonary nodule in the left upper lobe laterally just under 7 mm in greatest dimension.  IMPRESSION: 1. There is no evidence of an acute intracranial hemorrhage nor of an evolving ischemic event within the brain. There are extensive chronic changes consistent with small vessel ischemia and old lacunar infarctions. 2. There is no evidence of an acute cervical spine fracture nor dislocation. There is mild degenerative disc change at multiple levels which appears stable. 3. There is a stable soft tissue density nodule in the upper lobe of the left lung.   Electronically Signed   By: David  [place]   On: 06/21/2013 16:14    EKG Interpretation   None       MDM   1. Syncope   2. T12 compression fracture      6:05 PM Discussed case with Dr. Danielle Dess, on call Neurosurgeon, who recommends TSLO brace w/ f/u in 2 weeks in his office for re-evaluation.  Pt is afebrile presenting to the emergency department for a syncopal episode with precipitating dizziness. Syncopal episode was unwitnessed. No gross neuro focal deficits are appreciated on examination, examination is limited by language barrier and translator use. I have reviewed nursing notes, vital signs, and all appropriate lab and imaging results for this patient. Aside from T12 compression fracture noted on x-ray it  has been discussed with Dr. Danielle Dess no acute abnormalities have been noted. Patient will Cystal admitted for further evaluation of her syncopal episode. The patient appears reasonably stabilized for admission considering the current resources, flow, and capabilities available in the ED at this time, and I doubt any other [facility] requiring further screening and/or treatment in the ED prior to admission.Patient d/w with Dr. Micheline Maze, agrees with plan.        Jeannetta Ellis, PA-C 06/21/13 2154

## 2013-06-21 NOTE — ED Notes (Signed)
Asked family to translate to pt about effects of morphine, granddaughter sts pt is constantly repeating that she just wants to die. Family sts pt lives alone and refuses to let family help her because "she is ready to die".

## 2013-06-21 NOTE — Progress Notes (Signed)
Utilization Review completed.  Champayne Kocian RN CM  

## 2013-06-21 NOTE — ED Notes (Signed)
Patient with c/o nausea Patient medicated, see MAR X-Ray and MRI ordered for patient--patient's granddaughter does not want patient to go for testing until medication takes effect  Floor nurse present on unit to take to room 1443 Candise Bowens, Charge Nurse aware of issue and that patient is going up to floor [place], [facility] aware of issue and that patient went up to the floor without testing being completed

## 2013-06-22 LAB — LIPID PANEL
Cholesterol: 167 mg/dL (ref 0–200)
HDL: 52 mg/dL (ref >39)
LDL Cholesterol: 104 mg/dL — ABNORMAL HIGH (ref 0–99)
Total CHOL/HDL Ratio: 3.2 RATIO
Triglycerides: 53 mg/dL (ref <150)
VLDL: 11 mg/dL (ref 0–40)

## 2013-06-22 LAB — TSH: TSH: 0.342 u[IU]/mL — ABNORMAL LOW (ref 0.350–4.500)

## 2013-06-22 LAB — GLUCOSE, CAPILLARY
Glucose-Capillary: 122 mg/dL — ABNORMAL HIGH (ref 70–99)
Glucose-Capillary: 130 mg/dL — ABNORMAL HIGH (ref 70–99)
Glucose-Capillary: 110 mg/dL — ABNORMAL HIGH (ref 70–99)

## 2013-06-22 LAB — VITAMIN D 25 HYDROXY (VIT D DEFICIENCY, FRACTURES): Vit D, 25-Hydroxy: <10 ng/mL — ABNORMAL LOW (ref 30–89)

## 2013-06-22 MED ORDER — GLYCERIN (LAXATIVE) 2.1 G RE SUPP
1.0000 | Freq: Every day | RECTAL | Status: DC | PRN
  Administered 2013-06-22 – 2013-06-23 (×2): 1 via RECTAL
  Filled 2013-06-22 (×2): qty 1

## 2013-06-22 NOTE — ED Provider Notes (Signed)
Medical screening examination/treatment/procedure(s) were conducted as a shared visit with non-physician practitioner(s) and myself.  I personally evaluated the patient during the encounter.  Pt presents after fall at home due to unknown cause, complaining of pain of entire upper body. On my exam, pt uncomfortable, but in NAD. Cardiopulm exam benign. Pt found to have T12 compression fx.  Given poor pain control & possible syncopal episode, pt admitted to medical service for further w/u & pain control.   EKG Interpretation    Date/Time:  Friday June 21 2013 16:29:03 EST Ventricular Rate:  80 PR Interval:  190 QRS Duration: 96 QT Interval:  432 QTC Calculation: 498 R Axis:   63 Text Interpretation:  Sinus rhythm Probable left atrial enlargement Low voltage, extremity and precordial leads Borderline prolonged QT interval Confirmed by Micheline Maze  MD, MEGAN ([id number]) on 06/22/2013 11:21:28 AM              Shanna Cisco, MD 06/22/13 1124

## 2013-06-22 NOTE — Progress Notes (Signed)
[facility] PROGRESS NOTE  Bailee Nili Honda FAO:[id number] DOB: 1932/01/11 DOA: 06/21/2013 PCP: Pcp Not In System  Assessment/Plan: Syncope:  - CT head neg for acute stroke.  - MRI BRAIN ordered- old CVA - EKG shows normal sinus rhythm with left atrial enlargement.  - serial troponins negative.  - echocardiogram pending - PT /OT EVAL.  - aspirin 81 mg daily.   Hypertension:  - not well controlled.  - resume home medications.  - prn hydralazine   Diabetes mellitus;  - hgba1c  - SSI.  - hold metformin.    t12 fracture with osteopenia:  - normal calcium level  - get vit d levels. -brace    stable soft tissue density nodule in the upper lobe of the left lung  Code Status: full Family Communication: patient/daughter Disposition Plan: PT/OT- patient lives at home   Consultants:  none  Procedures:  Echo pending  Antibiotics:    HPI/Subjective: Patient feels constipated  Objective: Filed Vitals:   06/22/13 0500  BP: 138/70  Pulse: 80  Temp: 98 F (36.7 C)  Resp: 17   No intake or output data in the 24 hours ending 06/22/13 1111 Filed Weights   06/21/13 2245  Weight: 51.4 kg (113 lb 5.1 oz)    Exam:   General:  A+Ox3, NAD  Cardiovascular: rrr  Respiratory: clear anterior  Abdomen: +BS, soft  Musculoskeletal: moves all 4 ext   Data Reviewed: Basic Metabolic Panel:  Recent Labs Lab 06/21/13 1640  NA 136  K 4.3  CL 99  CO2 26  GLUCOSE 129*  BUN 16  CREATININE 0.70  CALCIUM 9.3   Liver Function Tests:  Recent Labs Lab 06/21/13 1640  AST 28  ALT 19  ALKPHOS 93  BILITOT 0.4  PROT 7.5  ALBUMIN 4.1   No results found for this basename: LIPASE, AMYLASE,  in the last 168 hours No results found for this basename: AMMONIA,  in the last 168 hours CBC:  Recent Labs Lab 06/21/13 1640  WBC 10.9*  HGB 13.3  HCT 39.5  MCV 86.6  PLT 160   Cardiac Enzymes:  Recent Labs Lab 06/21/13 1640 06/21/13 1950 06/22/13 0117  06/22/13 0740  TROPONINI <0.30 <0.30 <0.30 <0.30   BNP (last 3 results)  Recent Labs  06/21/13 1950  PROBNP 157.5   CBG:  Recent Labs Lab 06/21/13 1640 06/21/13 2357 06/22/13 0749  GLUCAP 108* 126* 103*    No results found for this or any previous visit (from the past 240 hour(s)).   Studies: Dg Chest 2 View  06/21/2013   CLINICAL DATA:  Possible abnormal soft tissue density within the upper lungs on a previous outside CT scan.  EXAM: CHEST  2 VIEW  COMPARISON:  Rib detail study of Nov 11, 2012.  FINDINGS: The lungs are adequately inflated. There is no focal infiltrate. There are coarse lung markings in the lingula which are not entirely new. There is no pleural effusion. The cardiopericardial silhouette is enlarged. The pulmonary vascularity is mildly prominent centrally. The observed portions of the bony thorax exhibit osteopenia. There is curvature of the thoracolumbar spine in an S shaped configuration.  IMPRESSION: 1. No definite upper lobe mass or other abnormality is demonstrated. 2. The findings suggest mild pulmonary interstitial edema likely of cardiac cause. 3. Atelectasis versus scarring in the lingula is present.   Electronically Signed   By: David  [place]   On: 06/21/2013 21:01   Dg Lumbar Spine Complete  06/21/2013   CLINICAL  DATA:  Low back pain.  Fall.  EXAM: LUMBAR SPINE - COMPLETE 4+ VIEW  COMPARISON:  CT 03/31/2012.  FINDINGS: T12 compression fracture is present with 50% loss of vertebral body height. Minimal retropulsion of the superior endplate. This appears new compared to the prior CT of 2013. Aortoiliac atherosclerosis. Osteopenia. Lumbar vertebral body height is preserved. There are 5 lumbar type vertebral bodies. Levoconvex curve of the lumbar spine.  IMPRESSION: Likely acute T12 compression fracture with 50% loss of vertebral body height and mild retropulsion. Underlying osteopenia.   Electronically Signed   By: Andreas Newport M.D.   On: 06/21/2013 16:19    Dg Pelvis 1-2 Views  06/21/2013   CLINICAL DATA:  Fall.  Right-sided pain.  EXAM: PELVIS - 1-2 VIEW  COMPARISON:  CT 03/31/2012.  FINDINGS: Old healed left obturator ring fracture is present. Dense phlebolith in the left anatomic pelvis. Osteopenia. There is no displaced acute fracture identified on the single view of the pelvis. Left obturator ring fracture was demonstrated on prior CT.  IMPRESSION: 1. Osteopenia without an acute osseous injury. 2. Old left obturator ring fracture, healed.   Electronically Signed   By: Andreas Newport M.D.   On: 06/21/2013 16:17   Ct Head Wo Contrast  06/21/2013   CLINICAL DATA:  Status post fall  EXAM: CT HEAD WITHOUT CONTRAST  CT CERVICAL SPINE WITHOUT CONTRAST  TECHNIQUE: Multidetector CT imaging of the head and cervical spine was performed following the standard protocol without intravenous contrast. Multiplanar CT image reconstructions of the cervical spine were also generated.  COMPARISON:  CT scan of the brain and cervical spine dated October 12, 2007.  FINDINGS: CT HEAD FINDINGS  There is mild diffuse cerebral and cerebellar atrophy with compensatory ventriculomegaly. These findings appear stable. There is an old lacunar infarction in the region of the genu of the internal capsule on the right. In the anterior aspect of the basal ganglia on the right there is evidence of an old lacunar infarction as well. Small lacunar infarctions in the periphery of the left basal ganglia are demonstrated. There is decreased density in the deep white matter of both cerebral hemispheres consistent with chronic small vessel ischemic type change. There is no shift of the midline. There is no evidence of an acute intracranial hemorrhage. There is no evidence of an evolving ischemic infarction. The cerebellum and brainstem exhibit no acute abnormalities.  At bone window settings the observed portions of the paranasal sinuses exhibit no air-fluid levels. The mastoid air cells appear  somewhat hypoplastic bilaterally. There are postsurgical changes in the right mastoid bone. There is no evidence of an acute skull fracture. There is no definite cephalohematoma.  CT CERVICAL SPINE FINDINGS  The cervical vertebral bodies are preserved in height. There is disc space narrowing at C5-6 with a prominent anterior endplate osteophyte nearly bridging the disc space. There are small posterior osteophytes at C3-4 and at C4-5. The prevertebral soft tissue spaces appear normal. There is no evidence of a perched facet nor spinous process fracture. The bony ring at each cervical level is intact. The observed portions of the 1st and 2nd ribs appear normal. There is apical pleural scarring bilaterally. There is a stable pulmonary nodule in the left upper lobe laterally just under 7 mm in greatest dimension.  IMPRESSION: 1. There is no evidence of an acute intracranial hemorrhage nor of an evolving ischemic event within the brain. There are extensive chronic changes consistent with small vessel ischemia and old lacunar infarctions.  2. There is no evidence of an acute cervical spine fracture nor dislocation. There is mild degenerative disc change at multiple levels which appears stable. 3. There is a stable soft tissue density nodule in the upper lobe of the left lung.   Electronically Signed   By: David  [place]   On: 06/21/2013 16:14   Ct Cervical Spine Wo Contrast  06/21/2013   CLINICAL DATA:  Status post fall  EXAM: CT HEAD WITHOUT CONTRAST  CT CERVICAL SPINE WITHOUT CONTRAST  TECHNIQUE: Multidetector CT imaging of the head and cervical spine was performed following the standard protocol without intravenous contrast. Multiplanar CT image reconstructions of the cervical spine were also generated.  COMPARISON:  CT scan of the brain and cervical spine dated October 12, 2007.  FINDINGS: CT HEAD FINDINGS  There is mild diffuse cerebral and cerebellar atrophy with compensatory ventriculomegaly. These findings appear  stable. There is an old lacunar infarction in the region of the genu of the internal capsule on the right. In the anterior aspect of the basal ganglia on the right there is evidence of an old lacunar infarction as well. Small lacunar infarctions in the periphery of the left basal ganglia are demonstrated. There is decreased density in the deep white matter of both cerebral hemispheres consistent with chronic small vessel ischemic type change. There is no shift of the midline. There is no evidence of an acute intracranial hemorrhage. There is no evidence of an evolving ischemic infarction. The cerebellum and brainstem exhibit no acute abnormalities.  At bone window settings the observed portions of the paranasal sinuses exhibit no air-fluid levels. The mastoid air cells appear somewhat hypoplastic bilaterally. There are postsurgical changes in the right mastoid bone. There is no evidence of an acute skull fracture. There is no definite cephalohematoma.  CT CERVICAL SPINE FINDINGS  The cervical vertebral bodies are preserved in height. There is disc space narrowing at C5-6 with a prominent anterior endplate osteophyte nearly bridging the disc space. There are small posterior osteophytes at C3-4 and at C4-5. The prevertebral soft tissue spaces appear normal. There is no evidence of a perched facet nor spinous process fracture. The bony ring at each cervical level is intact. The observed portions of the 1st and 2nd ribs appear normal. There is apical pleural scarring bilaterally. There is a stable pulmonary nodule in the left upper lobe laterally just under 7 mm in greatest dimension.  IMPRESSION: 1. There is no evidence of an acute intracranial hemorrhage nor of an evolving ischemic event within the brain. There are extensive chronic changes consistent with small vessel ischemia and old lacunar infarctions. 2. There is no evidence of an acute cervical spine fracture nor dislocation. There is mild degenerative disc  change at multiple levels which appears stable. 3. There is a stable soft tissue density nodule in the upper lobe of the left lung.   Electronically Signed   By: David  [place]   On: 06/21/2013 16:14   Mr Brain Wo Contrast  06/21/2013   CLINICAL DATA:  Syncope, fall with headache and back pain, nausea. Prior mastoidectomy.  EXAM: MRI HEAD WITHOUT CONTRAST  TECHNIQUE: Multiplanar, multiecho pulse sequences of the brain and surrounding structures were obtained without intravenous contrast.  COMPARISON:  CT of the head June 21, 2013 at 1554 hr.  FINDINGS: No reduced diffusion to suggest acute ischemia.  Moderate ventriculomegaly, with mild disproportionate sulcal effacement at the convexities. Bilateral basal ganglia fluid signal lacunar infarcts, addition to subcentimeter right thalamic remote lacunar  infarcts with minimal surrounding gliosis. Prominent right inferior occipital sulcus suggests underlying parenchymal volume loss. Confluent supratentorial white matter FLAIR T2 hyperintensities without midline shift nor mass effect. Remote small left cerebellar infarct associated with microhemorrhage. Bilateral thalami punctate foci of susceptibility artifact, in addition to a few scattered foci of susceptibility artifact within the deep white matter. No intrinsic T1 shortening to suggest subacute blood products.  No abnormal extra-axial fluid collections. Normal major intracranial vascular flow voids observed at the skull base.  Status post right wall down mastoidectomy, with fluid signal within the left middle ear and mastoid air cells, and right middle ear. No suspicious calvarial bone marrow signal, generalized bright T1 bone marrow signal favors osteopenia. Fluid filled non expanded sella. Craniocervical junction maintained. Patient is edentulous. Status post bilateral ocular lens implants.  IMPRESSION: No acute intracranial process, specifically no evidence of acute ischemia.  Moderate to severe white matter  changes suggest chronic small vessel ischemic disease with remote bilateral basal ganglia, right thalamic lacunar infarcts, and small remote left cerebellar hemorrhagic lacunar infarct. Scattered micro hemorrhages in the supratentorial brain favors sequelae of chronic hypertension.  Mild sulcal effacement at this convexities which can [place] associated with normal pressure hydrocephalus.  Status post right wall down mastoidectomy with fluid within the bilateral middle ears and left mastoid air cells, which may reflect eustachian tube dysfunction.   Electronically Signed   By: Awilda Metro   On: 06/21/2013 22:06    Scheduled Meds: . aspirin  81 mg Oral Daily  . enoxaparin (LOVENOX) injection  40 mg Subcutaneous Q24H  . insulin aspart  0-9 Units Subcutaneous TID WC  . losartan  25 mg Oral Daily  . sodium chloride  3 mL Intravenous Q12H   Continuous Infusions: . sodium chloride 50 mL/hr at 06/22/13 0000    Active Problems:   Syncope   Syncope and collapse   Leukocytosis   Hypertension   Diabetes mellitus due to underlying condition    Time spent: 35 min    VANN, JESSICA  [facility] Pager [phone]. If 7PM-7AM, please contact night-coverage at [email or web address], password [facility] 06/22/2013, 11:11 AM  LOS: 1 day

## 2013-06-23 DIAGNOSIS — I517 Cardiomegaly: Secondary | ICD-10-CM

## 2013-06-23 LAB — URINE CULTURE

## 2013-06-23 LAB — GLUCOSE, CAPILLARY
Glucose-Capillary: 104 mg/dL — ABNORMAL HIGH (ref 70–99)
Glucose-Capillary: 167 mg/dL — ABNORMAL HIGH (ref 70–99)

## 2013-06-23 LAB — VITAMIN D 1,25 DIHYDROXY
Vitamin D 1, 25 (OH)2 Total: 45 pg/mL (ref 18–72)
Vitamin D2 1, 25 (OH)2: <8 pg/mL
Vitamin D3 1, 25 (OH)2: 45 pg/mL

## 2013-06-23 LAB — T4, FREE: Free T4: 1.3 ng/dL (ref 0.80–1.80)

## 2013-06-23 LAB — T3, FREE: T3, Free: 2.3 pg/mL (ref 2.3–4.2)

## 2013-06-23 MED ORDER — PNEUMOCOCCAL VAC POLYVALENT 25 MCG/0.5ML IJ INJ
0.5000 mL | INJECTION | Freq: Once | INTRAMUSCULAR | Status: AC
  Administered 2013-06-23: 11:00:00 0.5 mL via INTRAMUSCULAR
  Filled 2013-06-23: qty 0.5

## 2013-06-23 MED ORDER — SENNOSIDES-DOCUSATE SODIUM 8.6-50 MG PO TABS
1.0000 | ORAL_TABLET | Freq: Two times a day (BID) | ORAL | Status: DC
  Administered 2013-06-23 – 2013-06-24 (×3): 1 via ORAL
  Filled 2013-06-23 (×4): qty 1

## 2013-06-23 NOTE — Evaluation (Signed)
Physical Therapy Evaluation Patient Details Name: Margaret [name] MRN: [id number] DOB: Oct 23, 1931 Today's Date: 06/23/2013 Time: 4098-1191 PT Time Calculation (min): 42 min  PT Assessment / Plan / Recommendation History of Present Illness  79 y.o. female with prior h/o hypertension, diabetes mellitus, was brought in by her grand daughter to ED, after a fall at home. Pt speaks [place]et[place] and with the help of translator, pt felt dizzy and felt as if pushed from the back , became unsteady and fell on the floor and hit her head. She reports losing consciousness and after which she reports calling the grand daughter. She reports occasional headache, and dizziness, reports multiple fall sin the past. She also reports occasional burning micturition in the past. She denies fever or chills. On arrival to ED, CT Head shows old lacunar infarcts and small vessel disease. Lumbar spine imaging shows t12 fracture, neurosurgery was consulted by EDP recommended TLS O BRACE and pain medications.   Clinical Impression  Patient continues to [place] unsafe to discharge home alone due to decreased strength and pain impacting mobility and will benefit from continued post-acute therapy. Currently recommending skilled nursing facility stay as pt's family is supportive, but cannot provide 24 hour S due to work schedules.     PT Assessment  Patient needs continued PT services    Follow Up Recommendations  SNF    Does the patient have the potential to tolerate intense rehabilitation      Barriers to Discharge   supportive family but limited by work schedules.    Equipment Recommendations  None recommended by PT    Recommendations for Other Services     Frequency Min 3X/week    Precautions / Restrictions Precautions Precautions: Fall;Back Precaution Booklet Issued: No (pt speaks [place] Precaution Comments: instructed in back precautions Required Braces or Orthoses: Spinal Brace Spinal Brace: Thoracolumbosacral  orthotic Restrictions Weight Bearing Restrictions: No   Pertinent Vitals/Pain O2 on 1 L/min 93%.  Decreased to 89% on room air.      Mobility  Bed Mobility Bed Mobility: Rolling Right;Rolling Left;Right Sidelying to Sit Rolling Right: 1: +2 Total assist Rolling Right: Patient Percentage: 50% Rolling Left: 1: +2 Total assist Rolling Left: Patient Percentage: 50% Right Sidelying to Sit: 1: +2 Total assist Right Sidelying to Sit: Patient Percentage: 50% Details for Bed Mobility Assistance: tactile and visual cueing for hand placement while TLSO donned.  grand-daughter giving verbal instructions. Transfers Transfers: Sit to [employer] Transfers Sit to Stand: 1: +2 Total assist Sit to Stand: Patient Percentage: 70% Stand Pivot Transfers: 1: +2 Total assist Stand Pivot Transfers: Patient Percentage: 70% Details for Transfer Assistance: Pt with small shuffeling steps for SPT with RW.    Exercises     PT Diagnosis: Acute pain;Generalized weakness;Difficulty walking  PT Problem List: Decreased strength;Decreased activity tolerance;Decreased mobility;Decreased knowledge of use of DME PT Treatment Interventions: Gait training;DME instruction;Functional mobility training;Therapeutic activities;Therapeutic exercise     PT Goals(Current goals can Danna found in the care plan section)    Visit Information  Last PT Received On: 06/23/13 Assistance Needed: +2 PT/OT/SLP Co-Evaluation/Treatment: Yes Reason for Co-Treatment: Complexity of the patient's impairments (multi-system involvement) PT goals addressed during session: Mobility/safety with mobility OT goals addressed during session: ADL's and self-care History of Present Illness: 79 y.o. female with prior h/o hypertension, diabetes mellitus, was brought in by her grand daughter to ED, after a fall at home. Pt speaks vietnamese and with the help of translator, pt felt dizzy and felt as if pushed  from the back , became unsteady and  fell on the floor and hit her head. She reports losing consciousness and after which she reports calling the grand daughter. She reports occasional headache, and dizziness, reports multiple fall sin the past. She also reports occasional burning micturition in the past. She denies fever or chills. On arrival to ED, CT Head shows old lacunar infarcts and small vessel disease. Lumbar spine imaging shows t12 fracture, neurosurgery was consulted by EDP recommended TLS O BRACE and pain medications.        Prior Functioning  Home Living Family/patient expects to [place] discharged to:: Unsure (likely will need snf) Living Arrangements: Alone Available Help at Discharge: Family;Available PRN/intermittently Type of Home: House Home Access: Stairs to enter Entrance Stairs-Number of Steps: 1 Home Layout: One level Home Equipment: Walker - 4 wheels;Cane - single point Prior Function Level of Independence: Independent with assistive device(s) Comments: walks with rollator Communication Communication: Prefers language other than English Dominant Hand: Right    Cognition  Cognition Arousal/Alertness: Awake/alert Behavior During Therapy: Anxious (about pain) Overall Cognitive Status: Within Functional Limits for tasks assessed    Extremity/Trunk Assessment Upper Extremity Assessment Upper Extremity Assessment: Generalized weakness Lower Extremity Assessment Lower Extremity Assessment: Generalized weakness Cervical / Trunk Assessment Cervical / Trunk Assessment: Kyphotic   Balance    End of Session PT - End of Session Equipment Utilized During Treatment: Gait belt;Back brace Activity Tolerance: Patient limited by pain;Patient limited by fatigue Patient left: in chair;with family/visitor present Nurse Communication: Mobility status;Patient requests pain meds  GP     Raza Bayless LUBECK 06/23/2013, 11:42 AM

## 2013-06-23 NOTE — Evaluation (Signed)
Occupational Therapy Evaluation Patient Details Name: Mirtie Shawanda Sievert MRN: [id number] DOB: 31-Aug-1931 Today's Date: 06/23/2013 Time: 4098-1191 OT Time Calculation (min): 57 min  OT Assessment / Plan / Recommendation History of present illness 79 y.o. female with prior h/o hypertension, diabetes mellitus, was brought in by her grand daughter to ED, after a fall at home. Pt speaks vietnamese and with the help of translator, pt felt dizzy and felt as if pushed from the back , became unsteady and fell on the floor and hit her head. She reports losing consciousness and after which she reports calling the grand daughter. She reports occasional headache, and dizziness, reports multiple fall sin the past. She also reports occasional burning micturition in the past. She denies fever or chills. On arrival to ED, CT Head shows old lacunar infarcts and small vessel disease. Lumbar spine imaging shows t12 fracture, neurosurgery was consulted by EDP recommended TLS O BRACE and pain medications.    Clinical Impression   Pt requires +2 assist for OOB and is dependent in bathing, dressing and toileting.  She has significant weakness.  Family cannon provide 24 hour care at home.  Recommending SNF.  Will defer OT to SNF.    OT Assessment  All further OT needs can Brianca met in the next venue of care    Follow Up Recommendations  SNF    Barriers to Discharge      Equipment Recommendations       Recommendations for Other Services    Frequency       Precautions / Restrictions Precautions Precautions: Fall;Back Precaution Booklet Issued: No (pt speaks [place] Precaution Comments: instructed in back precautions Required Braces or Orthoses: Spinal Brace Spinal Brace: Thoracolumbosacral orthotic applied in supine(no orders) Restrictions Weight Bearing Restrictions: No   Pertinent Vitals/Pain Back with movement, repositioned, RN informed    ADL  Eating/Feeding: Set up Where Assessed - Eating/Feeding:  Chair Grooming: Wash/dry hands;Wash/dry face;Set up Where Assessed - Grooming: Unsupported sitting Upper Body Bathing: Moderate assistance Where Assessed - Upper Body Bathing: Supine, head of bed up;Rolling right and/or left Lower Body Bathing: +1 Total assistance Where Assessed - Lower Body Bathing: Unsupported sitting;Supported sit to stand Upper Body Dressing: +1 Total assistance (rolling, TLSO) Where Assessed - Upper Body Dressing: Rolling right and/or left;Supine, head of bed up Lower Body Dressing: +1 Total assistance Where Assessed - Lower Body Dressing: Supine, head of bed up;Rolling right and/or left Toilet Transfer: +2 Total assistance Toilet Transfer: Patient Percentage: 80% Toilet Transfer Equipment: Bedside commode Toileting - Clothing Manipulation and Hygiene: +1 Total assistance Where Assessed - Toileting Clothing Manipulation and Hygiene: Sit to stand from 3-in-1 or toilet Equipment Used: Gait belt;Back brace;Rolling walker Transfers/Ambulation Related to ADLs: +2 total 80% with RW ADL Comments: Pt limited by pain.    OT Diagnosis: Generalized weakness;Acute pain  OT Problem List: Decreased strength;Decreased activity tolerance;Impaired balance (sitting and/or standing);Decreased knowledge of use of DME or AE;Decreased knowledge of precautions;Cardiopulmonary status limiting activity;Pain OT Treatment Interventions:     OT Goals(Current goals can Jaimie found in the care plan section)    Visit Information  Last OT Received On: 06/23/13 Assistance Needed: +2 PT/OT/SLP Co-Evaluation/Treatment: Yes Reason for Co-Treatment: Complexity of the patient's impairments (multi-system involvement) PT goals addressed during session: Mobility/safety with mobility OT goals addressed during session: ADL's and self-care History of Present Illness: 79 y.o. female with prior h/o hypertension, diabetes mellitus, was brought in by her grand daughter to ED, after a fall at home. Pt speaks  vietnamese and with the help of translator, pt felt dizzy and felt as if pushed from the back , became unsteady and fell on the floor and hit her head. She reports losing consciousness and after which she reports calling the grand daughter. She reports occasional headache, and dizziness, reports multiple fall sin the past. She also reports occasional burning micturition in the past. She denies fever or chills. On arrival to ED, CT Head shows old lacunar infarcts and small vessel disease. Lumbar spine imaging shows t12 fracture, neurosurgery was consulted by EDP recommended TLS O BRACE and pain medications.        Prior Functioning     Home Living Family/patient expects to Cornelius discharged to:: Unsure (likely will need snf) Living Arrangements: Alone Available Help at Discharge: Family;Available PRN/intermittently Type of Home: House Home Access: Stairs to enter Entrance Stairs-Number of Steps: 1 Home Layout: One level Home Equipment: Walker - 4 wheels;Cane - single point Prior Function Level of Independence: Independent with assistive device(s) Comments: walks with rollator Communication Communication: Prefers language other than English Dominant Hand: Right         Vision/Perception Vision - History Patient Visual Report: No change from baseline   Cognition  Cognition Arousal/Alertness: Awake/alert Behavior During Therapy: Anxious (about pain) Overall Cognitive Status: Within Functional Limits for tasks assessed    Extremity/Trunk Assessment Upper Extremity Assessment Upper Extremity Assessment: Generalized weakness Lower Extremity Assessment Lower Extremity Assessment: Generalized weakness Cervical / Trunk Assessment Cervical / Trunk Assessment: Kyphotic     Mobility Bed Mobility Bed Mobility: Rolling Right;Rolling Left;Right Sidelying to Sit Rolling Right: 1: +2 Total assist Rolling Right: Patient Percentage: 50% Rolling Left: 1: +2 Total assist Rolling Left: Patient  Percentage: 50% Right Sidelying to Sit: 1: +2 Total assist Right Sidelying to Sit: Patient Percentage: 50% Details for Bed Mobility Assistance: tactile and visual cueing for hand placement while TLSO donned.  grand-daughter giving verbal instructions. Transfers Sit to Stand: 1: +2 Total assist Sit to Stand: Patient Percentage: 70% Details for Transfer Assistance: Pt with small shuffeling steps for SPT with RW.     Exercise     Balance     End of Session OT - End of Session Activity Tolerance: Patient limited by pain;Patient limited by fatigue Patient left: in chair;with call bell/phone within reach;with family/visitor present Nurse Communication: Mobility status;Patient requests pain meds (d/c plan)  GO     Javana, Schey 06/23/2013, 11:55 AM [phone]

## 2013-06-23 NOTE — Progress Notes (Signed)
  Echocardiogram 2D Echocardiogram has been performed.  Arvil Chaco 06/23/2013, 4:23 PM

## 2013-06-23 NOTE — Progress Notes (Addendum)
PROGRESS NOTE  Margaret [name] ZOX:[id number] DOB: August 18, 1931 DOA: 06/21/2013 PCP: Pcp Not In System  Assessment/Plan: Syncope: - CT head neg for acute stroke. MRI BRAIN ordered and showed old CVA. Obtain cortisol 12/29 am. - EKG shows normal sinus rhythm with left atrial enlargement.  - serial troponins negative.  - echocardiogram pending - PT /OT evaluation 12/28 with recommendations for SNF. Patient and family agreeable, consulted SW.  - aspirin 81 mg daily.  - patient with dizziness upon standing up at home, will obtain repeat orthostatic vital signs 12/29 in am after hydration.  Decreased TSH - will obtain free T3 and T4. TSH very close to normal. ?subclinical hyperthyroidism.  Hypertension:  - resume home medications.  - prn hydralazine  Diabetes mellitus;  - hgba1c 6.0 showing good control.  - SSI.  - hold metformin.  T12 fracture with osteopenia: this was discussed by EDP with Dr. Danielle Dess - normal calcium level  - brace  Stable soft tissue density nodule in the upper lobe of the left lung  Code Status: full Family Communication: patient/daughter Disposition Plan: SNF 1-2 days  Consultants:  none  Procedures:  Echo pending  Antibiotics:  none  HPI/Subjective: Patient feels constipated today but otherwise without complaints  Objective: Filed Vitals:   06/23/13 0500  BP: 129/75  Pulse: 77  Temp: 98.7 F (37.1 C)  Resp: 14    Intake/Output Summary (Last 24 hours) at 06/23/13 1047 Last data filed at 06/22/13 1900  Gross per 24 hour  Intake   1380 ml  Output      0 ml  Net   1380 ml   Filed Weights   06/21/13 2245  Weight: 51.4 kg (113 lb 5.1 oz)   Exam:  General:  A+Ox3, NAD  Cardiovascular: rrr  Respiratory: clear anterior  Abdomen: +BS, soft  Musculoskeletal: moves all 4 ext   Data Reviewed: Basic Metabolic Panel:  Recent Labs Lab 06/21/13 1640  NA 136  K 4.3  CL 99  CO2 26  GLUCOSE 129*  BUN 16  CREATININE 0.70  CALCIUM 9.3    Liver Function Tests:  Recent Labs Lab 06/21/13 1640  AST 28  ALT 19  ALKPHOS 93  BILITOT 0.4  PROT 7.5  ALBUMIN 4.1   CBC:  Recent Labs Lab 06/21/13 1640  WBC 10.9*  HGB 13.3  HCT 39.5  MCV 86.6  PLT 160   Cardiac Enzymes:  Recent Labs Lab 06/21/13 1640 06/21/13 1950 06/22/13 0117 06/22/13 0740  TROPONINI <0.30 <0.30 <0.30 <0.30   BNP (last 3 results)  Recent Labs  06/21/13 1950  PROBNP 157.5   CBG:  Recent Labs Lab 06/22/13 0749 06/22/13 1159 06/22/13 1740 06/22/13 2259 06/23/13 0809  GLUCAP 103* 122* 130* 110* 104*    Recent Results (from the past 240 hour(s))  URINE CULTURE     Status: None   Collection Time    06/21/13  4:29 PM      Result Value Range Status   Specimen Description URINE, CLEAN CATCH   Final   Special Requests NONE   Final   Culture  Setup Time     Final   Value: 06/22/2013 00:19     Performed at [facility] Count     Final   Value: 75,000 COLONIES/ML     Performed at [facility]   Culture     Final   Value: Multiple bacterial morphotypes present, none predominant. Suggest appropriate recollection if clinically indicated.  Performed at [facility]   Report Status 06/23/2013 FINAL   Final   Studies: Dg Chest 2 View  06/21/2013   CLINICAL DATA:  Possible abnormal soft tissue density within the upper lungs on a previous outside CT scan.  EXAM: CHEST  2 VIEW  COMPARISON:  Rib detail study of Nov 11, 2012.  FINDINGS: The lungs are adequately inflated. There is no focal infiltrate. There are coarse lung markings in the lingula which are not entirely new. There is no pleural effusion. The cardiopericardial silhouette is enlarged. The pulmonary vascularity is mildly prominent centrally. The observed portions of the bony thorax exhibit osteopenia. There is curvature of the thoracolumbar spine in an S shaped configuration.  IMPRESSION: 1. No definite upper lobe mass or other abnormality is  demonstrated. 2. The findings suggest mild pulmonary interstitial edema likely of cardiac cause. 3. Atelectasis versus scarring in the lingula is present.   Electronically Signed   By: David  [place]   On: 06/21/2013 21:01   Dg Lumbar Spine Complete  06/21/2013   CLINICAL DATA:  Low back pain.  Fall.  EXAM: LUMBAR SPINE - COMPLETE 4+ VIEW  COMPARISON:  CT 03/31/2012.  FINDINGS: T12 compression fracture is present with 50% loss of vertebral body height. Minimal retropulsion of the superior endplate. This appears new compared to the prior CT of 2013. Aortoiliac atherosclerosis. Osteopenia. Lumbar vertebral body height is preserved. There are 5 lumbar type vertebral bodies. Levoconvex curve of the lumbar spine.  IMPRESSION: Likely acute T12 compression fracture with 50% loss of vertebral body height and mild retropulsion. Underlying osteopenia.   Electronically Signed   By: Andreas Newport M.D.   On: 06/21/2013 16:19   Dg Pelvis 1-2 Views  06/21/2013   CLINICAL DATA:  Fall.  Right-sided pain.  EXAM: PELVIS - 1-2 VIEW  COMPARISON:  CT 03/31/2012.  FINDINGS: Old healed left obturator ring fracture is present. Dense phlebolith in the left anatomic pelvis. Osteopenia. There is no displaced acute fracture identified on the single view of the pelvis. Left obturator ring fracture was demonstrated on prior CT.  IMPRESSION: 1. Osteopenia without an acute osseous injury. 2. Old left obturator ring fracture, healed.   Electronically Signed   By: Andreas Newport M.D.   On: 06/21/2013 16:17   Ct Head Wo Contrast  06/21/2013   CLINICAL DATA:  Status post fall  EXAM: CT HEAD WITHOUT CONTRAST  CT CERVICAL SPINE WITHOUT CONTRAST  TECHNIQUE: Multidetector CT imaging of the head and cervical spine was performed following the standard protocol without intravenous contrast. Multiplanar CT image reconstructions of the cervical spine were also generated.  COMPARISON:  CT scan of the brain and cervical spine dated October 12, 2007.   FINDINGS: CT HEAD FINDINGS  There is mild diffuse cerebral and cerebellar atrophy with compensatory ventriculomegaly. These findings appear stable. There is an old lacunar infarction in the region of the genu of the internal capsule on the right. In the anterior aspect of the basal ganglia on the right there is evidence of an old lacunar infarction as well. Small lacunar infarctions in the periphery of the left basal ganglia are demonstrated. There is decreased density in the deep white matter of both cerebral hemispheres consistent with chronic small vessel ischemic type change. There is no shift of the midline. There is no evidence of an acute intracranial hemorrhage. There is no evidence of an evolving ischemic infarction. The cerebellum and brainstem exhibit no acute abnormalities.  At bone window settings the  observed portions of the paranasal sinuses exhibit no air-fluid levels. The mastoid air cells appear somewhat hypoplastic bilaterally. There are postsurgical changes in the right mastoid bone. There is no evidence of an acute skull fracture. There is no definite cephalohematoma.  CT CERVICAL SPINE FINDINGS  The cervical vertebral bodies are preserved in height. There is disc space narrowing at C5-6 with a prominent anterior endplate osteophyte nearly bridging the disc space. There are small posterior osteophytes at C3-4 and at C4-5. The prevertebral soft tissue spaces appear normal. There is no evidence of a perched facet nor spinous process fracture. The bony ring at each cervical level is intact. The observed portions of the 1st and 2nd ribs appear normal. There is apical pleural scarring bilaterally. There is a stable pulmonary nodule in the left upper lobe laterally just under 7 mm in greatest dimension.  IMPRESSION: 1. There is no evidence of an acute intracranial hemorrhage nor of an evolving ischemic event within the brain. There are extensive chronic changes consistent with small vessel ischemia  and old lacunar infarctions. 2. There is no evidence of an acute cervical spine fracture nor dislocation. There is mild degenerative disc change at multiple levels which appears stable. 3. There is a stable soft tissue density nodule in the upper lobe of the left lung.   Electronically Signed   By: David  [place]   On: 06/21/2013 16:14   Ct Cervical Spine Wo Contrast  06/21/2013   CLINICAL DATA:  Status post fall  EXAM: CT HEAD WITHOUT CONTRAST  CT CERVICAL SPINE WITHOUT CONTRAST  TECHNIQUE: Multidetector CT imaging of the head and cervical spine was performed following the standard protocol without intravenous contrast. Multiplanar CT image reconstructions of the cervical spine were also generated.  COMPARISON:  CT scan of the brain and cervical spine dated October 12, 2007.  FINDINGS: CT HEAD FINDINGS  There is mild diffuse cerebral and cerebellar atrophy with compensatory ventriculomegaly. These findings appear stable. There is an old lacunar infarction in the region of the genu of the internal capsule on the right. In the anterior aspect of the basal ganglia on the right there is evidence of an old lacunar infarction as well. Small lacunar infarctions in the periphery of the left basal ganglia are demonstrated. There is decreased density in the deep white matter of both cerebral hemispheres consistent with chronic small vessel ischemic type change. There is no shift of the midline. There is no evidence of an acute intracranial hemorrhage. There is no evidence of an evolving ischemic infarction. The cerebellum and brainstem exhibit no acute abnormalities.  At bone window settings the observed portions of the paranasal sinuses exhibit no air-fluid levels. The mastoid air cells appear somewhat hypoplastic bilaterally. There are postsurgical changes in the right mastoid bone. There is no evidence of an acute skull fracture. There is no definite cephalohematoma.  CT CERVICAL SPINE FINDINGS  The cervical vertebral  bodies are preserved in height. There is disc space narrowing at C5-6 with a prominent anterior endplate osteophyte nearly bridging the disc space. There are small posterior osteophytes at C3-4 and at C4-5. The prevertebral soft tissue spaces appear normal. There is no evidence of a perched facet nor spinous process fracture. The bony ring at each cervical level is intact. The observed portions of the 1st and 2nd ribs appear normal. There is apical pleural scarring bilaterally. There is a stable pulmonary nodule in the left upper lobe laterally just under 7 mm in greatest dimension.  IMPRESSION: 1.  There is no evidence of an acute intracranial hemorrhage nor of an evolving ischemic event within the brain. There are extensive chronic changes consistent with small vessel ischemia and old lacunar infarctions. 2. There is no evidence of an acute cervical spine fracture nor dislocation. There is mild degenerative disc change at multiple levels which appears stable. 3. There is a stable soft tissue density nodule in the upper lobe of the left lung.   Electronically Signed   By: David  [place]   On: 06/21/2013 16:14   Mr Brain Wo Contrast  06/21/2013   CLINICAL DATA:  Syncope, fall with headache and back pain, nausea. Prior mastoidectomy.  EXAM: MRI HEAD WITHOUT CONTRAST  TECHNIQUE: Multiplanar, multiecho pulse sequences of the brain and surrounding structures were obtained without intravenous contrast.  COMPARISON:  CT of the head June 21, 2013 at 1554 hr.  FINDINGS: No reduced diffusion to suggest acute ischemia.  Moderate ventriculomegaly, with mild disproportionate sulcal effacement at the convexities. Bilateral basal ganglia fluid signal lacunar infarcts, addition to subcentimeter right thalamic remote lacunar infarcts with minimal surrounding gliosis. Prominent right inferior occipital sulcus suggests underlying parenchymal volume loss. Confluent supratentorial white matter FLAIR T2 hyperintensities without  midline shift nor mass effect. Remote small left cerebellar infarct associated with microhemorrhage. Bilateral thalami punctate foci of susceptibility artifact, in addition to a few scattered foci of susceptibility artifact within the deep white matter. No intrinsic T1 shortening to suggest subacute blood products.  No abnormal extra-axial fluid collections. Normal major intracranial vascular flow voids observed at the skull base.  Status post right wall down mastoidectomy, with fluid signal within the left middle ear and mastoid air cells, and right middle ear. No suspicious calvarial bone marrow signal, generalized bright T1 bone marrow signal favors osteopenia. Fluid filled non expanded sella. Craniocervical junction maintained. Patient is edentulous. Status post bilateral ocular lens implants.  IMPRESSION: No acute intracranial process, specifically no evidence of acute ischemia.  Moderate to severe white matter changes suggest chronic small vessel ischemic disease with remote bilateral basal ganglia, right thalamic lacunar infarcts, and small remote left cerebellar hemorrhagic lacunar infarct. Scattered micro hemorrhages in the supratentorial brain favors sequelae of chronic hypertension.  Mild sulcal effacement at this convexities which can [place] associated with normal pressure hydrocephalus.  Status post right wall down mastoidectomy with fluid within the bilateral middle ears and left mastoid air cells, which may reflect eustachian tube dysfunction.   Electronically Signed   By: Awilda Metro   On: 06/21/2013 22:06    Scheduled Meds: . aspirin  81 mg Oral Daily  . enoxaparin (LOVENOX) injection  40 mg Subcutaneous Q24H  . insulin aspart  0-9 Units Subcutaneous TID WC  . losartan  25 mg Oral Daily  . sodium chloride  3 mL Intravenous Q12H   Continuous Infusions: . sodium chloride 50 mL/hr (06/22/13 1742)    Active Problems:   Syncope   Syncope and collapse   Leukocytosis   Hypertension    Diabetes mellitus due to underlying condition  Time spent: 25 min  Pamella Pert  [facility] Pager [phone] If 7PM-7AM, please contact night-coverage at [email or web address], password [facility] 06/23/2013, 10:47 AM  LOS: 2 days

## 2013-06-24 DIAGNOSIS — R05 Cough: Secondary | ICD-10-CM | POA: Diagnosis not present

## 2013-06-24 DIAGNOSIS — I5022 Chronic systolic (congestive) heart failure: Secondary | ICD-10-CM | POA: Diagnosis not present

## 2013-06-24 DIAGNOSIS — R55 Syncope and collapse: Secondary | ICD-10-CM | POA: Diagnosis not present

## 2013-06-24 DIAGNOSIS — I5032 Chronic diastolic (congestive) heart failure: Secondary | ICD-10-CM | POA: Diagnosis not present

## 2013-06-24 DIAGNOSIS — I509 Heart failure, unspecified: Secondary | ICD-10-CM | POA: Diagnosis not present

## 2013-06-24 DIAGNOSIS — IMO0002 Reserved for concepts with insufficient information to code with codable children: Secondary | ICD-10-CM | POA: Diagnosis not present

## 2013-06-24 DIAGNOSIS — R1084 Generalized abdominal pain: Secondary | ICD-10-CM | POA: Diagnosis not present

## 2013-06-24 DIAGNOSIS — M6281 Muscle weakness (generalized): Secondary | ICD-10-CM | POA: Diagnosis not present

## 2013-06-24 DIAGNOSIS — I10 Essential (primary) hypertension: Secondary | ICD-10-CM | POA: Diagnosis not present

## 2013-06-24 DIAGNOSIS — J811 Chronic pulmonary edema: Secondary | ICD-10-CM | POA: Diagnosis not present

## 2013-06-24 DIAGNOSIS — E139 Other specified diabetes mellitus without complications: Secondary | ICD-10-CM | POA: Diagnosis not present

## 2013-06-24 DIAGNOSIS — Z5189 Encounter for other specified aftercare: Secondary | ICD-10-CM | POA: Diagnosis not present

## 2013-06-24 DIAGNOSIS — Z9181 History of falling: Secondary | ICD-10-CM | POA: Diagnosis not present

## 2013-06-24 DIAGNOSIS — D72829 Elevated white blood cell count, unspecified: Secondary | ICD-10-CM | POA: Diagnosis not present

## 2013-06-24 DIAGNOSIS — K5900 Constipation, unspecified: Secondary | ICD-10-CM | POA: Diagnosis not present

## 2013-06-24 DIAGNOSIS — R269 Unspecified abnormalities of gait and mobility: Secondary | ICD-10-CM | POA: Diagnosis not present

## 2013-06-24 DIAGNOSIS — M549 Dorsalgia, unspecified: Secondary | ICD-10-CM | POA: Diagnosis not present

## 2013-06-24 DIAGNOSIS — R109 Unspecified abdominal pain: Secondary | ICD-10-CM | POA: Diagnosis not present

## 2013-06-24 DIAGNOSIS — S22009A Unspecified fracture of unspecified thoracic vertebra, initial encounter for closed fracture: Secondary | ICD-10-CM | POA: Diagnosis not present

## 2013-06-24 DIAGNOSIS — I6990 Unspecified sequelae of unspecified cerebrovascular disease: Secondary | ICD-10-CM | POA: Diagnosis not present

## 2013-06-24 LAB — BASIC METABOLIC PANEL
CO2: 25 mEq/L (ref 19–32)
Calcium: 8.4 mg/dL (ref 8.4–10.5)
Chloride: 100 mEq/L (ref 96–112)
Creatinine, Ser: 0.7 mg/dL (ref 0.50–1.10)
GFR calc Af Amer: >90 mL/min (ref >90)
GFR calc non Af Amer: 81 mL/min — ABNORMAL LOW (ref >90)
Sodium: 134 mEq/L — ABNORMAL LOW (ref 135–145)

## 2013-06-24 LAB — CBC
HCT: 34.1 % — ABNORMAL LOW (ref 36.0–46.0)
MCH: 29.2 pg (ref 26.0–34.0)
Platelets: 108 10*3/uL — ABNORMAL LOW (ref 150–400)
RBC: 3.97 MIL/uL (ref 3.87–5.11)
RDW: 13.9 % (ref 11.5–15.5)
WBC: 6 10*3/uL (ref 4.0–10.5)

## 2013-06-24 LAB — GLUCOSE, CAPILLARY
Glucose-Capillary: 95 mg/dL (ref 70–99)
Glucose-Capillary: 138 mg/dL — ABNORMAL HIGH (ref 70–99)

## 2013-06-24 MED ORDER — TRAMADOL-ACETAMINOPHEN 37.5-325 MG PO TABS: 1.0000 | ORAL_TABLET | ORAL | Status: DC | PRN

## 2013-06-24 MED ORDER — ASPIRIN 81 MG PO CHEW: 81.0000 mg | CHEWABLE_TABLET | Freq: Every day | ORAL | Status: DC

## 2013-06-24 NOTE — Progress Notes (Signed)
Patient to discharge to [facility] today. Patient & grandaughter aware.   Clinical Social Work Department CLINICAL SOCIAL WORK PLACEMENT NOTE 06/24/2013  Patient:  Margaret [name],Margaret [name] [facility]  Account Number:  [id number] Admit date:  06/21/2013  Clinical Social Worker:  Orpah Greek  Date/time:  06/24/2013 11:35 AM  Clinical Social Work is seeking post-discharge placement for this patient at the following level of care:   SKILLED NURSING   (*CSW will update this form in Epic as items are completed)   06/24/2013  Patient/family provided with [facility] Department of Clinical Social Work's list of facilities offering this level of care within the geographic area requested by the patient (or if unable, by the patient's family).  06/24/2013  Patient/family informed of their freedom to choose among providers that offer the needed level of care, that participate in Medicare, Medicaid or managed care program needed by the patient, have an available bed and are willing to accept the patient.  06/24/2013  Patient/family informed of [facility]' ownership interest in [facility], as well as of the fact that they are under no obligation to receive care at this facility.  PASARR submitted to EDS on 06/24/2013 PASARR number received from EDS on 06/24/2013  FL2 transmitted to all facilities in geographic area requested by pt/family on  06/24/2013 FL2 transmitted to all facilities within larger geographic area on   Patient informed that his/her managed care company has contracts with or will negotiate with  certain facilities, including the following:     Patient/family informed of bed offers received:  06/24/2013 Patient chooses bed at [facility] Physician recommends and patient chooses bed at    Patient to [place] transferred to [facility] on  06/24/2013 Patient to [place] transferred to facility by [facility]  The following physician request were entered  in Epic:   Additional Comments:   Unice Bailey, LCSW [facility] Clinical Social Worker cell #: [phone]

## 2013-06-24 NOTE — Discharge Summary (Addendum)
Physician Discharge Summary  Margaret [name] VFI:[id number] DOB: July 09, 1931 DOA: 06/21/2013  PCP: Pcp Not In System  Admit date: 06/21/2013 Discharge date: 06/24/2013  Time spent: 25 minutes  Recommendations for Outpatient Follow-up:  1. Patient will continue with TLSO brace as determined by physical therapy at skilled nursing facility 2. Patient will follow up with primary care doctor in one month 3. Repeat thyroid function studies recommended in one month  Discharge Diagnoses:  Active Problems:   Syncope   Syncope and collapse   Leukocytosis   Hypertension   Diabetes mellitus due to underlying condition   Chronic diastolic heart failure   Discharge Condition: Improved, being discharged to skilled nursing facility  Diet recommendation: Carb modified heart healthy  Filed Weights   06/21/13 2245  Weight: 51.4 kg (113 lb 5.1 oz)    History of present illness:  On 12/26:Margaret [name] is a 79 y.o. female with prior h/o hypertension, diabetes mellitus, was brought in by her grand daughter to ED, after a fall at home. Pt speaks vietnamese and with the help of translator, pt felt dizzy and felt as if pushed from the back , became unsteady and fell on the floor and hit her head. She reports losing consciousness and after which she reports calling the grand daughter. She reports occasional headache, and dizziness, reports multiple fall sin the past. She also reports occasional burning micturition in the past. She denies fever or chills. On arrival to ED, CT Head shows old lacunar infarcts and small vessel disease. Lumbar spine imaging shows t12 fracture, neurosurgery was consulted by EDP recommended TLS O BRACE and pain medications. She is referred to medical service for admission for evaluation of syncope.    Hospital Course:  Active Problems:   Syncope: Workup unrevealing. CT negative for acute stroke as was MRI. Echocardiogram noted grade 1 diastolic dysfunction, but currently well-controlled.  Thyroid function studies noted mildly decreased TSH, but relatively normal free T3 in 3-4 levels.  Serial troponins negative. Has been started on aspirin 81 mg by mouth daily. On day of discharge, orthostatics checked and patient is not orthostatic. This could in part Margaret [name] from overall deconditioning and dehydration.    Leukocytosis: Likely stress margination. Patient remained afebrile. No signs of pneumonia on chest x-ray. No fever. Resolved by the following day    Hypertension: Patient resumed on her normal home medications. Blood pressure stable.  History of old CVA: Patient found to have multiple old strokes on MRI of brain. We'll continue aspirin 81 mg daily.    Diabetes mellitus due to underlying condition: Stable. Patient will continue on metformin. Hemoglobin A1c at 6.0.    Chronic diastolic heart failure: Incidental finding on echocardiogram. BNP normal. Patient already on diuretic plus ACE inhibitor. Given episodes of syncope, rule out acute beta blocker at this time  T12 fracture: Case was discussed by ER physician with neurosurgery, Dr. Danielle Dess.  He recommended TLSO brace. Patient later that PT OT recommending skilled nursing. Calcium level checked and have been normal.  Lung nodule: Healthy soft tissue density seen in upper lobe the left lung. Stable from previous x-rays.   Procedures:  Echocardiogram done 12/28-grade 1 diastolic dysfunction  Consultations:  Case discussed with neurosurgery by phone.  Discharge Exam: Filed Vitals:   06/24/13 1306  BP: 119/65  Pulse: 75  Temp: 99.1 F (37.3 C)  Resp: 18    General: Alert and oriented x3, no acute distress Cardiovascular: Regular rate and rhythm, S1-S2 Respiratory: Clear to auscultation bilaterally  Discharge Instructions  Discharge Orders   Future Orders Complete By Expires   Diet - low sodium heart healthy  As directed    Discharge instructions  As directed    Comments:     Maintain TLSO brace   Increase  activity slowly  As directed        Medication List         aspirin 81 MG chewable tablet  Chew 1 tablet (81 mg total) by mouth daily.     furosemide 40 MG tablet  Commonly known as:  LASIX  Take 40 mg by mouth daily.     losartan 25 MG tablet  Commonly known as:  COZAAR  Take 25 mg by mouth daily.     metFORMIN 500 MG 24 hr tablet  Commonly known as:  GLUCOPHAGE-XR  Take 500 mg by mouth every evening.     traMADol-acetaminophen 37.5-325 MG per tablet  Commonly known as:  ULTRACET  Take 1-2 tablets by mouth every 4 (four) hours as needed for severe pain.        No Known Allergies    The results of significant diagnostics from this hospitalization (including imaging, microbiology, ancillary and laboratory) are listed below for reference.    Significant Diagnostic Studies: Dg Chest 2 View  06/21/2013   CLINICAL DATA:  Possible abnormal soft tissue density within the upper lungs on a previous outside CT scan.  EXAM: CHEST  2 VIEW  COMPARISON:  Rib detail study of Nov 11, 2012.  FINDINGS: The lungs are adequately inflated. There is no focal infiltrate. There are coarse lung markings in the lingula which are not entirely new. There is no pleural effusion. The cardiopericardial silhouette is enlarged. The pulmonary vascularity is mildly prominent centrally. The observed portions of the bony thorax exhibit osteopenia. There is curvature of the thoracolumbar spine in an S shaped configuration.  IMPRESSION: 1. No definite upper lobe mass or other abnormality is demonstrated. 2. The findings suggest mild pulmonary interstitial edema likely of cardiac cause. 3. Atelectasis versus scarring in the lingula is present.   Electronically Signed   By: David  [place]   On: 06/21/2013 21:01   Dg Lumbar Spine Complete  06/21/2013   CLINICAL DATA:  Low back pain.  Fall.  EXAM: LUMBAR SPINE - COMPLETE 4+ VIEW  COMPARISON:  CT 03/31/2012.  FINDINGS: T12 compression fracture is present with 50% loss  of vertebral body height. Minimal retropulsion of the superior endplate. This appears new compared to the prior CT of 2013. Aortoiliac atherosclerosis. Osteopenia. Lumbar vertebral body height is preserved. There are 5 lumbar type vertebral bodies. Levoconvex curve of the lumbar spine.  IMPRESSION: Likely acute T12 compression fracture with 50% loss of vertebral body height and mild retropulsion. Underlying osteopenia.   Electronically Signed   By: Andreas Newport M.D.   On: 06/21/2013 16:19   Dg Pelvis 1-2 Views  06/21/2013   CLINICAL DATA:  Fall.  Right-sided pain.  EXAM: PELVIS - 1-2 VIEW  COMPARISON:  CT 03/31/2012.  FINDINGS: Old healed left obturator ring fracture is present. Dense phlebolith in the left anatomic pelvis. Osteopenia. There is no displaced acute fracture identified on the single view of the pelvis. Left obturator ring fracture was demonstrated on prior CT.  IMPRESSION: 1. Osteopenia without an acute osseous injury. 2. Old left obturator ring fracture, healed.   Electronically Signed   By: Andreas Newport M.D.   On: 06/21/2013 16:17   Ct Head Wo Contrast  06/21/2013  CLINICAL DATA:  Status post fall  EXAM: CT HEAD WITHOUT CONTRAST  CT CERVICAL SPINE WITHOUT CONTRAST  TECHNIQUE: Multidetector CT imaging of the head and cervical spine was performed following the standard protocol without intravenous contrast. Multiplanar CT image reconstructions of the cervical spine were also generated.  COMPARISON:  CT scan of the brain and cervical spine dated October 12, 2007.  FINDINGS: CT HEAD FINDINGS  There is mild diffuse cerebral and cerebellar atrophy with compensatory ventriculomegaly. These findings appear stable. There is an old lacunar infarction in the region of the genu of the internal capsule on the right. In the anterior aspect of the basal ganglia on the right there is evidence of an old lacunar infarction as well. Small lacunar infarctions in the periphery of the left basal ganglia are  demonstrated. There is decreased density in the deep white matter of both cerebral hemispheres consistent with chronic small vessel ischemic type change. There is no shift of the midline. There is no evidence of an acute intracranial hemorrhage. There is no evidence of an evolving ischemic infarction. The cerebellum and brainstem exhibit no acute abnormalities.  At bone window settings the observed portions of the paranasal sinuses exhibit no air-fluid levels. The mastoid air cells appear somewhat hypoplastic bilaterally. There are postsurgical changes in the right mastoid bone. There is no evidence of an acute skull fracture. There is no definite cephalohematoma.  CT CERVICAL SPINE FINDINGS  The cervical vertebral bodies are preserved in height. There is disc space narrowing at C5-6 with a prominent anterior endplate osteophyte nearly bridging the disc space. There are small posterior osteophytes at C3-4 and at C4-5. The prevertebral soft tissue spaces appear normal. There is no evidence of a perched facet nor spinous process fracture. The bony ring at each cervical level is intact. The observed portions of the 1st and 2nd ribs appear normal. There is apical pleural scarring bilaterally. There is a stable pulmonary nodule in the left upper lobe laterally just under 7 mm in greatest dimension.  IMPRESSION: 1. There is no evidence of an acute intracranial hemorrhage nor of an evolving ischemic event within the brain. There are extensive chronic changes consistent with small vessel ischemia and old lacunar infarctions. 2. There is no evidence of an acute cervical spine fracture nor dislocation. There is mild degenerative disc change at multiple levels which appears stable. 3. There is a stable soft tissue density nodule in the upper lobe of the left lung.   Electronically Signed   By: David  [place]   On: 06/21/2013 16:14   Ct Cervical Spine Wo Contrast  06/21/2013   CLINICAL DATA:  Status post fall  EXAM: CT HEAD  WITHOUT CONTRAST  CT CERVICAL SPINE WITHOUT CONTRAST  TECHNIQUE: Multidetector CT imaging of the head and cervical spine was performed following the standard protocol without intravenous contrast. Multiplanar CT image reconstructions of the cervical spine were also generated.  COMPARISON:  CT scan of the brain and cervical spine dated October 12, 2007.  FINDINGS: CT HEAD FINDINGS  There is mild diffuse cerebral and cerebellar atrophy with compensatory ventriculomegaly. These findings appear stable. There is an old lacunar infarction in the region of the genu of the internal capsule on the right. In the anterior aspect of the basal ganglia on the right there is evidence of an old lacunar infarction as well. Small lacunar infarctions in the periphery of the left basal ganglia are demonstrated. There is decreased density in the deep white matter of both cerebral  hemispheres consistent with chronic small vessel ischemic type change. There is no shift of the midline. There is no evidence of an acute intracranial hemorrhage. There is no evidence of an evolving ischemic infarction. The cerebellum and brainstem exhibit no acute abnormalities.  At bone window settings the observed portions of the paranasal sinuses exhibit no air-fluid levels. The mastoid air cells appear somewhat hypoplastic bilaterally. There are postsurgical changes in the right mastoid bone. There is no evidence of an acute skull fracture. There is no definite cephalohematoma.  CT CERVICAL SPINE FINDINGS  The cervical vertebral bodies are preserved in height. There is disc space narrowing at C5-6 with a prominent anterior endplate osteophyte nearly bridging the disc space. There are small posterior osteophytes at C3-4 and at C4-5. The prevertebral soft tissue spaces appear normal. There is no evidence of a perched facet nor spinous process fracture. The bony ring at each cervical level is intact. The observed portions of the 1st and 2nd ribs appear normal.  There is apical pleural scarring bilaterally. There is a stable pulmonary nodule in the left upper lobe laterally just under 7 mm in greatest dimension.  IMPRESSION: 1. There is no evidence of an acute intracranial hemorrhage nor of an evolving ischemic event within the brain. There are extensive chronic changes consistent with small vessel ischemia and old lacunar infarctions. 2. There is no evidence of an acute cervical spine fracture nor dislocation. There is mild degenerative disc change at multiple levels which appears stable. 3. There is a stable soft tissue density nodule in the upper lobe of the left lung.   Electronically Signed   By: David  [place]   On: 06/21/2013 16:14   Mr Brain Wo Contrast  06/21/2013   CLINICAL DATA:  Syncope, fall with headache and back pain, nausea. Prior mastoidectomy.  EXAM: MRI HEAD WITHOUT CONTRAST  TECHNIQUE: Multiplanar, multiecho pulse sequences of the brain and surrounding structures were obtained without intravenous contrast.  COMPARISON:  CT of the head June 21, 2013 at 1554 hr.  FINDINGS: No reduced diffusion to suggest acute ischemia.  Moderate ventriculomegaly, with mild disproportionate sulcal effacement at the convexities. Bilateral basal ganglia fluid signal lacunar infarcts, addition to subcentimeter right thalamic remote lacunar infarcts with minimal surrounding gliosis. Prominent right inferior occipital sulcus suggests underlying parenchymal volume loss. Confluent supratentorial white matter FLAIR T2 hyperintensities without midline shift nor mass effect. Remote small left cerebellar infarct associated with microhemorrhage. Bilateral thalami punctate foci of susceptibility artifact, in addition to a few scattered foci of susceptibility artifact within the deep white matter. No intrinsic T1 shortening to suggest subacute blood products.  No abnormal extra-axial fluid collections. Normal major intracranial vascular flow voids observed at the skull base.   Status post right wall down mastoidectomy, with fluid signal within the left middle ear and mastoid air cells, and right middle ear. No suspicious calvarial bone marrow signal, generalized bright T1 bone marrow signal favors osteopenia. Fluid filled non expanded sella. Craniocervical junction maintained. Patient is edentulous. Status post bilateral ocular lens implants.  IMPRESSION: No acute intracranial process, specifically no evidence of acute ischemia.  Moderate to severe white matter changes suggest chronic small vessel ischemic disease with remote bilateral basal ganglia, right thalamic lacunar infarcts, and small remote left cerebellar hemorrhagic lacunar infarct. Scattered micro hemorrhages in the supratentorial brain favors sequelae of chronic hypertension.  Mild sulcal effacement at this convexities which can Deloma associated with normal pressure hydrocephalus.  Status post right wall down mastoidectomy with fluid within the  bilateral middle ears and left mastoid air cells, which may reflect eustachian tube dysfunction.   Electronically Signed   By: Awilda Metro   On: 06/21/2013 22:06    Microbiology: Recent Results (from the past 240 hour(s))  URINE CULTURE     Status: None   Collection Time    06/21/13  4:29 PM      Result Value Range Status   Specimen Description URINE, CLEAN CATCH   Final   Special Requests NONE   Final   Culture  Setup Time     Final   Value: 06/22/2013 00:19     Performed at [facility] Count     Final   Value: 75,000 COLONIES/ML     Performed at [facility]   Culture     Final   Value: Multiple bacterial morphotypes present, none predominant. Suggest appropriate recollection if clinically indicated.     Performed at [facility]   Report Status 06/23/2013 FINAL   Final     Labs: Basic Metabolic Panel:  Recent Labs Lab 06/21/13 1640 06/24/13 0434  NA 136 134*  K 4.3 3.2*  CL 99 100  CO2 26 25  GLUCOSE 129* 108*   BUN 16 16  CREATININE 0.70 0.70  CALCIUM 9.3 8.4   Liver Function Tests:  Recent Labs Lab 06/21/13 1640  AST 28  ALT 19  ALKPHOS 93  BILITOT 0.4  PROT 7.5  ALBUMIN 4.1   No results found for this basename: LIPASE, AMYLASE,  in the last 168 hours No results found for this basename: AMMONIA,  in the last 168 hours CBC:  Recent Labs Lab 06/21/13 1640 06/24/13 0434  WBC 10.9* 6.0  HGB 13.3 11.6*  HCT 39.5 34.1*  MCV 86.6 85.9  PLT 160 108*   Cardiac Enzymes:  Recent Labs Lab 06/21/13 1640 06/21/13 1950 06/22/13 0117 06/22/13 0740  TROPONINI <0.30 <0.30 <0.30 <0.30   BNP: BNP (last 3 results)  Recent Labs  06/21/13 1950  PROBNP 157.5   CBG:  Recent Labs Lab 06/23/13 1150 06/23/13 1751 06/23/13 2155 06/24/13 0740 06/24/13 1150  GLUCAP 186* 167* 113* 95 138*       Signed:  Suzy Kugel K  [facility] 06/24/2013, 1:50 PM

## 2013-06-24 NOTE — Progress Notes (Signed)
Patient is set to discharge to [facility] today. Patient & grandaughter aware. Discharge packet in [place]. [facility] scheduled for transport pickup (Service Request Id: [id number]).   Unice Bailey, LCSW [facility] Clinical Social Worker cell #: [phone]

## 2013-06-24 NOTE — Progress Notes (Signed)
Clinical Social Work Department BRIEF PSYCHOSOCIAL ASSESSMENT 06/24/2013  Patient:  Margaret [name],Margaret [name] [facility]     Account Number:  [id number]     Admit date:  06/21/2013  Clinical Social Worker:  Orpah Greek  Date/Time:  06/24/2013 11:29 AM  Referred by:  Physician  Date Referred:  06/24/2013 Referred for  SNF Placement   Other Referral:   Interview type:  Patient Other interview type:   and grandaughter, Margaret [name] at bedside    PSYCHOSOCIAL DATA Living Status:  ALONE Admitted from facility:   Level of care:   Primary support name:  Margaret [name] (daughter) ph#: [phone] Primary support relationship to patient:  CHILD, ADULT Degree of support available:   good    CURRENT CONCERNS Current Concerns  Post-Acute Placement   Other Concerns:    SOCIAL WORK ASSESSMENT / PLAN CSW received consult for SNF placement.   Assessment/plan status:  Information/Referral to [facility] Other assessment/ plan:   Information/referral to community resources:   CSW completed FL2 and faxed information out to [facility] - provided bed offers to patient & grandaughter.    PATIENT'S/FAMILY'S RESPONSE TO PLAN OF CARE: CSW answered questions form patient & grandaughter regarding SNF - provided bed offers, [facility] requested [name] as [facility] is close to her family. CSW confirmed with [facility] [address] [facility] that [facility] would [name] able to take patient.    Dr. Rito Ehrlich made aware.       Unice Bailey, LCSW [facility] Clinical Social Worker cell #: [phone]

## 2013-06-24 NOTE — Progress Notes (Signed)
Discharge to [facility], report given to [facility], p/u by [place].

## 2013-06-25 ENCOUNTER — Other Ambulatory Visit: Payer: Self-pay | Admitting: *Deleted

## 2013-06-25 MED ORDER — TRAMADOL-ACETAMINOPHEN 37.5-325 MG PO TABS: ORAL_TABLET | ORAL | Status: DC

## 2013-06-29 ENCOUNTER — Non-Acute Institutional Stay (SKILLED_NURSING_FACILITY): Payer: Medicare Other | Admitting: Internal Medicine

## 2013-06-29 DIAGNOSIS — S22009A Unspecified fracture of unspecified thoracic vertebra, initial encounter for closed fracture: Secondary | ICD-10-CM | POA: Diagnosis not present

## 2013-06-29 DIAGNOSIS — IMO0001 Reserved for inherently not codable concepts without codable children: Secondary | ICD-10-CM

## 2013-06-29 DIAGNOSIS — I6990 Unspecified sequelae of unspecified cerebrovascular disease: Secondary | ICD-10-CM | POA: Diagnosis not present

## 2013-06-29 DIAGNOSIS — S22080A Wedge compression fracture of T11-T12 vertebra, initial encounter for closed fracture: Secondary | ICD-10-CM

## 2013-06-29 DIAGNOSIS — E1165 Type 2 diabetes mellitus with hyperglycemia: Secondary | ICD-10-CM

## 2013-06-29 DIAGNOSIS — R269 Unspecified abnormalities of gait and mobility: Secondary | ICD-10-CM

## 2013-06-29 NOTE — Progress Notes (Signed)
Patient ID: Margaret [name], female   DOB: 01/02/1932, 80 y.o.   MRN: [id number]  Nursing Home Admission [facility] SNF Chief Complaint; admission to SNF postdate Margaret [name] from December 26 through December 29. History; this is a 34 atrial woman who was brought in by her granddaughter to the emergency room after a fall at home. The patient apparently felt a sensation of dizziness and then fell backwards she hit her head but did not lose consciousness CT scan of the head showed old lacunar infarcts and small vessel disease. This was followed up with an MRI which showed extensive prior multi-infarct state. Lumbar spine imaging showed a T12 fracture for which neurosurgery was consulted. A brace was recommended however I don't see this currently.  Past Medical History  Diagnosis Date  . Hypertension   . Diabetes mellitus   . Gout    . Past Surgical History  Procedure Laterality Date  . No past surgeries     Medications; ASA 81 daily, Lasix 40 daily, losartan 25 daily, Glucophage XR 500 mg every morning, Ultracet one to 2 tablets every 4 hours as needed for pain he  Social history; there is very little information available the patient speaks minimal [place] no [place]. I am assuming she lives with family and although her exact functional status/mobility status is unclear Family history; not available from any current source  Review of systems; not possible from the patient however there has apparently been issues with constipation since her arrival in the facility. A KUB was ordered although I don't see these results  Physical examination Gen. frail 80 year old woman in no distress. Vitals O2 sat 94% on room air pulse 75 respirations 18 and unlabored Respiratory; clear entry bilaterally no crackles or wheezes Cardiac heart sounds are normal no murmurs no carotid bruits Abdomen; slightly distended but no liver no spleen no masses GU bladder is not enlarged at the bedside  Musculoskeletal;  patient has significant osteoarthritis of her knees nevertheless she is antigravity strength in her arms and legs. Gait; she could barely bring herself to a sitting position in bed tending to lean to the right. She retropulsed when attempting to stand with maximal assistance of one person Neurologic; in spite of the impressive MRI shown below there is very little lateralizing findings. Mental status; although the patient speaks her little [place] she was able to communicate the she lives in [place]. She stated she was 82 which appears to Margaret [name] correct.  Impression/plan #1 very disabled frail woman probably mostly as a result of a combination of significant osteoarthritis, a multiple cerebral infarctions state and post fall syndrome. Her reflexes were maintained at the knees both plantar responses were downgoing is difficult to say whether she has significant diabetic neuropathy. She was very anxious when I attempted to stand her up  #2 type 2 diabetes on metformin we'll check a hemoglobin A1c. Her hemoglobin A1c was 6 wonder whether she really needs to continue on the metformin all #3 diastolically mediated heart failure she is on a diuretic plus ACE inhibitor I will recheck her lab work this week  #4 T12 fracture with probable osteoporosis. We'll check a 25-hydroxy vitamin D level on her                       Margaret [name], [place] [address]                         [phone]   ------------------------------------------------------------  Transthoracic Echocardiography  Patient:    Margaret [name], Margaret [name] MR #:       [id number] Study Date: 06/23/2013 Gender:     F Age:        31 Height:     162.6cm Weight:     51.3kg BSA:        1.57m^2 Pt. Status: Room:       WA22    PERFORMING   [facility]  ATTENDING    Margaret [name]  ADMITTING    Margaret [name]  Margaret [name], Margaret [name]  SONOGRAPHER  Margaret [name] cc:  ------------------------------------------------------------ LV EF: 65% -    70%  ------------------------------------------------------------ Indications:      Syncope 780.2.  ------------------------------------------------------------ History:   Risk factors:  Hypertension. Diabetes mellitus.   ------------------------------------------------------------ Study Conclusions  Left ventricle: The cavity size was normal. There was mild focal basal hypertrophy of the septum. Systolic function was vigorous. The estimated ejection fraction was in the range of 65% to 70%. Although no diagnostic regional wall motion abnormality was identified, this possibility cannot Bea completely excluded on the basis of this study. Doppler parameters are consistent with abnormal left ventricular relaxation (grade 1 diastolic dysfunction). Doppler parameters are consistent with mildly elevated mean left atrial filling pressure.              Transthoracic echocardiography.  M-mode, complete 2D, spectral Doppler, and color Doppler.  Height:  Height: 162.6cm. Height: 64in. Weight:  Weight: 51.3kg. Weight: 112.8lb.  Body mass index: BMI: 19.4kg/m^2.  Body surface area:    BSA: 1.14m^2.  Blood pressure:     159/82.  Patient status:  Inpatient. Location:  Bedside.  ------------------------------------------------------------  ------------------------------------------------------------ Left ventricle:  The cavity size was normal. There was mild focal basal hypertrophy of the septum. Systolic function was vigorous. The estimated ejection fraction was in the range of 65% to 70%. Although no diagnostic regional wall motion abnormality was identified, this possibility cannot Margaret [name] completely excluded on the basis of this study. Doppler parameters are consistent with abnormal left ventricular relaxation (grade 1 diastolic dysfunction). Doppler parameters are consistent with mildly elevated mean left atrial filling  pressure.  ------------------------------------------------------------ Aortic valve:  Poorly visualized.  The valve appears to Margaret [name] grossly normal. Trileaflet; normal thickness leaflets. Mobility was not restricted.  Doppler:  Transvalvular velocity was within the normal range. There was no stenosis.  No regurgitation.  ------------------------------------------------------------ Aorta:  The aorta was poorly visualized. Aortic root: The aortic root was normal in size. Ascending aorta: The ascending aorta was normal in size.  ------------------------------------------------------------ Mitral valve:   Structurally normal valve.   Mobility was not restricted.  Doppler:  Transvalvular velocity was within the normal range. There was no evidence for stenosis.  No regurgitation.  ------------------------------------------------------------ Left atrium:  The atrium was at the upper limits of normal in size.  ------------------------------------------------------------ Right ventricle:  The cavity size was normal. Wall thickness was normal. Systolic function was normal.  ------------------------------------------------------------ Pulmonic valve:   Not visualized.  Doppler:  Transvalvular velocity was within the normal range. There was no evidence for stenosis.  No significant regurgitation.  ------------------------------------------------------------ Tricuspid valve:   Structurally normal valve.    Doppler: Transvalvular velocity was within the normal range.  No regurgitation.  ------------------------------------------------------------ Pulmonary artery:   The main pulmonary artery was normal-sized.  Systolic pressure could not Jayliani accurately estimated.  ------------------------------------------------------------ Right atrium:  The atrium was normal in size.  ------------------------------------------------------------ Pericardium:  There was no pericardial  effusion.  ------------------------------------------------------------ Systemic  veins: Inferior vena cava: Not visualized.  ------------------------------------------------------------ Post procedure conclusions Ascending Aorta:  - The aorta was poorly visualized.  ------------------------------------------------------------  2D measurements        Normal  Doppler               Normal Left ventricle                 measurements LVID ED,   26.8 mm     43-52   Left ventricle chord,                         Ea, lat      5.44 cm/ ------- PLAX                           ann, tiss         s LVID ES,     20 mm     23-38   DP chord,                         E/Ea, lat   10.97     ------- PLAX                           ann, tiss FS, chord,   25 %      >29     DP PLAX                           Ea, med       4.9 cm/ ------- LVPW, ED   10.3 mm     ------  ann, tiss         s IVS/LVPW   1.28        <1.3    DP ratio, ED                      E/Ea, med   12.18     ------- Ventricular septum             ann, tiss IVS, ED    13.2 mm     ------  DP Aorta                          Mitral valve Root diam,   30 mm     ------  Peak E vel   59.7 cm/ ------- ED                                               s Left atrium                    Peak A vel    101 cm/ ------- AP dim       31 mm     ------                    s AP dim     2.03 cm/m^2 <2.2    Deceleratio   264 ms  150-230 index  n time                                Peak E/A      0.6     -------                                ratio                                Systemic veins                                Estimated       7 mm  -------                                CVP               Hg                                Right ventricle                                Sa vel, lat  13.9 cm/ -------                                ann, tiss         s                                DP    ------------------------------------------------------------ Prepared and Electronically Authenticated by  Bryan Lemma 2014-12-29T12:37:27.863     CLINICAL DATA:  Syncope, fall with headache and back pain, nausea. Prior mastoidectomy.   EXAM: MRI HEAD WITHOUT CONTRAST   TECHNIQUE: Multiplanar, multiecho pulse sequences of the brain and surrounding structures were obtained without intravenous contrast.   COMPARISON:  CT of the head June 21, 2013 at 1554 hr.   FINDINGS: No reduced diffusion to suggest acute ischemia.   Moderate ventriculomegaly, with mild disproportionate sulcal effacement at the convexities. Bilateral basal ganglia fluid signal lacunar infarcts, addition to subcentimeter right thalamic remote lacunar infarcts with minimal surrounding gliosis. Prominent right inferior occipital sulcus suggests underlying parenchymal volume loss. Confluent supratentorial white matter FLAIR T2 hyperintensities without midline shift nor mass effect. Remote small left cerebellar infarct associated with microhemorrhage. Bilateral thalami punctate foci of susceptibility artifact, in addition to a few scattered foci of susceptibility artifact within the deep white matter. No intrinsic T1 shortening to suggest subacute blood products.   No abnormal extra-axial fluid collections. Normal major intracranial vascular flow voids observed at the skull base.   Status post right wall down mastoidectomy, with fluid signal within the left middle ear and mastoid air cells, and right middle ear. No suspicious calvarial bone marrow signal, generalized bright T1 bone marrow signal favors osteopenia. Fluid filled non expanded sella. Craniocervical junction maintained. Patient is edentulous. Status post bilateral ocular lens implants.   IMPRESSION: No acute intracranial process, specifically no evidence  of acute ischemia.   Moderate to severe white matter changes suggest chronic small  vessel ischemic disease with remote bilateral basal ganglia, right thalamic lacunar infarcts, and small remote left cerebellar hemorrhagic lacunar infarct. Scattered micro hemorrhages in the supratentorial brain favors sequelae of chronic hypertension.   Mild sulcal effacement at this convexities which can Zoa associated with normal pressure hydrocephalus.   Status post right wall down mastoidectomy with fluid within the bilateral middle ears and left mastoid air cells, which may reflect eustachian tube dysfunction.     Electronically Signed   By: Awilda Metroourtnay  Bloomer   On: 06/21/2013 22:06

## 2013-07-11 ENCOUNTER — Encounter: Payer: Self-pay | Admitting: *Deleted

## 2013-07-11 ENCOUNTER — Non-Acute Institutional Stay (SKILLED_NURSING_FACILITY): Payer: Medicare Other | Admitting: Family

## 2013-07-11 DIAGNOSIS — I6990 Unspecified sequelae of unspecified cerebrovascular disease: Secondary | ICD-10-CM

## 2013-07-11 DIAGNOSIS — R269 Unspecified abnormalities of gait and mobility: Secondary | ICD-10-CM | POA: Diagnosis not present

## 2013-07-11 DIAGNOSIS — IMO0001 Reserved for inherently not codable concepts without codable children: Secondary | ICD-10-CM | POA: Diagnosis not present

## 2013-07-11 DIAGNOSIS — S22080A Wedge compression fracture of T11-T12 vertebra, initial encounter for closed fracture: Secondary | ICD-10-CM

## 2013-07-11 DIAGNOSIS — E1165 Type 2 diabetes mellitus with hyperglycemia: Principal | ICD-10-CM

## 2013-07-11 DIAGNOSIS — S22009A Unspecified fracture of unspecified thoracic vertebra, initial encounter for closed fracture: Secondary | ICD-10-CM

## 2013-07-14 ENCOUNTER — Encounter: Payer: Self-pay | Admitting: Family

## 2013-07-14 NOTE — Progress Notes (Signed)
Patient ID: Margaret [name], female   DOB: 06-24-1932, 80 y.o.   MRN: [id number]  Date: 07/11/13 Facility: [facility]   Chief Complaint  Patient presents with  . Discharge Note    HPI: Pt admitted for to [facility] for short-term rehabilitation s/p hospitalization for syncopal episode  due to multiple infarcts. MRI revealed T12  Fracture resulting in abnormal gait.  No further issues of concerns expressed by patient, patient's family or health care team.     No Known Allergies   Medication List       This list is accurate as of: 07/11/13 11:59 PM.  Always use your most recent med list.               acetaminophen 650 MG CR tablet  Commonly known as:  TYLENOL  Take 650 mg by mouth every 6 (six) hours as needed for fever. Take 1 tablet by mouth every 6 hours as needed for fever every 24 hours, fever >101 degrees. **Don't exceed 3000 mg in a 24 hours period.**     aspirin 81 MG chewable tablet  Chew 1 tablet (81 mg total) by mouth daily.     feeding supplement (GLUCERNA SHAKE) Liqd  Take 237 mLs by mouth daily. Give 1 can of Glucerna daily for weight loss.     furosemide 40 MG tablet  Commonly known as:  LASIX  Take 40 mg by mouth daily.     guaifenesin 100 MG/5ML syrup  Commonly known as:  ROBITUSSIN  Take by mouth every 4 (four) hours as needed for cough. Give 10 cc by mouth every 4 hours for 48 hrs for cough.     losartan 25 MG tablet  Commonly known as:  COZAAR  Take 25 mg by mouth daily.     metFORMIN 500 MG 24 hr tablet  Commonly known as:  GLUCOPHAGE-XR  Take 500 mg by mouth every evening.     oseltamivir 12 MG/ML suspension  Commonly known as:  TAMIFLU  Take 75 mg by mouth daily. Give 1 cup by mouth for 7 days -prophylaxis. To start on 07/06/13     traMADol-acetaminophen 37.5-325 MG per tablet  Commonly known as:  ULTRACET  Take one tablet by mouth every four hours as needed for pain; Take two tablets by mouth every four hours as needed for severe  pain         DATA REVIEWED   Laboratory Studies: Reviewed     Past Medical History  Diagnosis Date  . Hypertension   . Diabetes mellitus   . Gout   . Chronic diastolic heart failure       Review of Systems  Constitutional: Negative.   Respiratory: Negative.   Cardiovascular: Negative.   Gastrointestinal: Positive for constipation.  Skin: Negative.   Neurological: Negative.      Physical Exam Filed Vitals:   07/14/13 1502  BP: 142/88  Pulse: 89  Temp: 97.8 F (36.6 C)  Resp: 20   There is no weight on file to calculate BMI. Physical Exam  Constitutional:  Pt communicates using simple phrases and gestures; dressed and groomed appropriately  Cardiovascular: Normal rate and regular rhythm.   Pulmonary/Chest: Effort normal and breath sounds normal.  Neurological: She is alert.  Unable to fully assess orientation d/t language barrier    ASSESSMENT/PLAN  Pt d/c with PT, OT, HHN, DME: [facility] Prescriptions provided for pt. Stat CBC with diff and BMP ordered Senna tablet q hs  for unspecified constipation  Follow up:prn

## 2013-07-16 DIAGNOSIS — M8580 Other specified disorders of bone density and structure, unspecified site: Secondary | ICD-10-CM

## 2013-07-16 DIAGNOSIS — R05 Cough: Secondary | ICD-10-CM | POA: Diagnosis not present

## 2013-07-16 DIAGNOSIS — R059 Cough, unspecified: Secondary | ICD-10-CM | POA: Diagnosis not present

## 2013-07-16 DIAGNOSIS — I6990 Unspecified sequelae of unspecified cerebrovascular disease: Secondary | ICD-10-CM | POA: Diagnosis not present

## 2013-07-16 DIAGNOSIS — I10 Essential (primary) hypertension: Secondary | ICD-10-CM | POA: Diagnosis not present

## 2013-07-16 DIAGNOSIS — S22009A Unspecified fracture of unspecified thoracic vertebra, initial encounter for closed fracture: Secondary | ICD-10-CM | POA: Diagnosis not present

## 2013-07-16 DIAGNOSIS — R0989 Other specified symptoms and signs involving the circulatory and respiratory systems: Secondary | ICD-10-CM | POA: Diagnosis not present

## 2013-07-16 DIAGNOSIS — R946 Abnormal results of thyroid function studies: Secondary | ICD-10-CM | POA: Diagnosis not present

## 2013-07-16 DIAGNOSIS — R55 Syncope and collapse: Secondary | ICD-10-CM | POA: Diagnosis not present

## 2013-07-16 DIAGNOSIS — E119 Type 2 diabetes mellitus without complications: Secondary | ICD-10-CM | POA: Diagnosis not present

## 2013-07-16 DIAGNOSIS — S22089A Unspecified fracture of T11-T12 vertebra, initial encounter for closed fracture: Secondary | ICD-10-CM

## 2013-07-16 DIAGNOSIS — I5030 Unspecified diastolic (congestive) heart failure: Secondary | ICD-10-CM | POA: Diagnosis not present

## 2013-07-16 HISTORY — DX: Other specified disorders of bone density and structure, unspecified site: M85.80

## 2013-07-16 HISTORY — DX: Unspecified fracture of t11-T12 vertebra, initial encounter for closed fracture: S22.089A

## 2013-07-18 ENCOUNTER — Encounter (HOSPITAL_COMMUNITY): Payer: Self-pay | Admitting: Emergency Medicine

## 2013-07-18 ENCOUNTER — Emergency Department (HOSPITAL_COMMUNITY): Payer: Medicare Other

## 2013-07-18 ENCOUNTER — Inpatient Hospital Stay (HOSPITAL_COMMUNITY)
Admission: AD | Admit: 2013-07-18 | Discharge: 2013-07-22 | DRG: 194 | Disposition: A | Discharge status: Skilled Nursing Facility | Payer: Medicare Other | Attending: Internal Medicine | Admitting: Internal Medicine

## 2013-07-18 DIAGNOSIS — F329 Major depressive disorder, single episode, unspecified: Secondary | ICD-10-CM | POA: Diagnosis present

## 2013-07-18 DIAGNOSIS — F3289 Other specified depressive episodes: Secondary | ICD-10-CM | POA: Diagnosis present

## 2013-07-18 DIAGNOSIS — G40909 Epilepsy, unspecified, not intractable, without status epilepticus: Secondary | ICD-10-CM | POA: Diagnosis not present

## 2013-07-18 DIAGNOSIS — R109 Unspecified abdominal pain: Secondary | ICD-10-CM | POA: Diagnosis present

## 2013-07-18 DIAGNOSIS — E876 Hypokalemia: Secondary | ICD-10-CM | POA: Diagnosis present

## 2013-07-18 DIAGNOSIS — I5032 Chronic diastolic (congestive) heart failure: Secondary | ICD-10-CM | POA: Diagnosis not present

## 2013-07-18 DIAGNOSIS — IMO0002 Reserved for concepts with insufficient information to code with codable children: Secondary | ICD-10-CM | POA: Diagnosis not present

## 2013-07-18 DIAGNOSIS — J189 Pneumonia, unspecified organism: Secondary | ICD-10-CM | POA: Diagnosis not present

## 2013-07-18 DIAGNOSIS — M6281 Muscle weakness (generalized): Secondary | ICD-10-CM | POA: Diagnosis not present

## 2013-07-18 DIAGNOSIS — R5381 Other malaise: Secondary | ICD-10-CM

## 2013-07-18 DIAGNOSIS — R6889 Other general symptoms and signs: Secondary | ICD-10-CM | POA: Diagnosis not present

## 2013-07-18 DIAGNOSIS — F32A Depression, unspecified: Secondary | ICD-10-CM

## 2013-07-18 DIAGNOSIS — R509 Fever, unspecified: Secondary | ICD-10-CM | POA: Diagnosis not present

## 2013-07-18 DIAGNOSIS — R609 Edema, unspecified: Secondary | ICD-10-CM | POA: Diagnosis present

## 2013-07-18 DIAGNOSIS — Z87891 Personal history of nicotine dependence: Secondary | ICD-10-CM | POA: Diagnosis not present

## 2013-07-18 DIAGNOSIS — J984 Other disorders of lung: Secondary | ICD-10-CM | POA: Diagnosis not present

## 2013-07-18 DIAGNOSIS — E139 Other specified diabetes mellitus without complications: Secondary | ICD-10-CM | POA: Diagnosis not present

## 2013-07-18 DIAGNOSIS — R1312 Dysphagia, oropharyngeal phase: Secondary | ICD-10-CM | POA: Diagnosis not present

## 2013-07-18 DIAGNOSIS — M79609 Pain in unspecified limb: Secondary | ICD-10-CM | POA: Diagnosis present

## 2013-07-18 DIAGNOSIS — I509 Heart failure, unspecified: Secondary | ICD-10-CM | POA: Diagnosis present

## 2013-07-18 DIAGNOSIS — Z9181 History of falling: Secondary | ICD-10-CM | POA: Diagnosis not present

## 2013-07-18 DIAGNOSIS — E119 Type 2 diabetes mellitus without complications: Secondary | ICD-10-CM

## 2013-07-18 DIAGNOSIS — I10 Essential (primary) hypertension: Secondary | ICD-10-CM | POA: Diagnosis present

## 2013-07-18 DIAGNOSIS — M7989 Other specified soft tissue disorders: Secondary | ICD-10-CM | POA: Diagnosis not present

## 2013-07-18 DIAGNOSIS — Z8673 Personal history of transient ischemic attack (TIA), and cerebral infarction without residual deficits: Secondary | ICD-10-CM | POA: Diagnosis not present

## 2013-07-18 DIAGNOSIS — R55 Syncope and collapse: Secondary | ICD-10-CM | POA: Diagnosis not present

## 2013-07-18 DIAGNOSIS — Z5189 Encounter for other specified aftercare: Secondary | ICD-10-CM | POA: Diagnosis not present

## 2013-07-18 DIAGNOSIS — J111 Influenza due to unidentified influenza virus with other respiratory manifestations: Secondary | ICD-10-CM

## 2013-07-18 DIAGNOSIS — R0902 Hypoxemia: Secondary | ICD-10-CM | POA: Diagnosis present

## 2013-07-18 DIAGNOSIS — R0789 Other chest pain: Secondary | ICD-10-CM | POA: Diagnosis present

## 2013-07-18 DIAGNOSIS — D72829 Elevated white blood cell count, unspecified: Secondary | ICD-10-CM | POA: Diagnosis not present

## 2013-07-18 DIAGNOSIS — K5900 Constipation, unspecified: Secondary | ICD-10-CM | POA: Diagnosis present

## 2013-07-18 DIAGNOSIS — I110 Hypertensive heart disease with heart failure: Secondary | ICD-10-CM | POA: Diagnosis present

## 2013-07-18 DIAGNOSIS — E089 Diabetes mellitus due to underlying condition without complications: Secondary | ICD-10-CM | POA: Diagnosis present

## 2013-07-18 HISTORY — DX: Elevated white blood cell count, unspecified: D72.829

## 2013-07-18 HISTORY — DX: Depression, unspecified: F32.A

## 2013-07-18 LAB — CBC WITH DIFFERENTIAL/PLATELET
BASOS ABS: 0 10*3/uL (ref 0.0–0.1)
Basophils Relative: 0 % (ref 0–1)
Eosinophils Absolute: 0.1 10*3/uL (ref 0.0–0.7)
Eosinophils Relative: 1 % (ref 0–5)
HEMATOCRIT: 34 % — AB (ref 36.0–46.0)
Hemoglobin: 11.3 g/dL — ABNORMAL LOW (ref 12.0–15.0)
LYMPHS PCT: 10 % — AB (ref 12–46)
Lymphs Abs: 1.1 10*3/uL (ref 0.7–4.0)
MCH: 28.7 pg (ref 26.0–34.0)
MCHC: 33.2 g/dL (ref 30.0–36.0)
MCV: 86.3 fL (ref 78.0–100.0)
Monocytes Absolute: 0.9 10*3/uL (ref 0.1–1.0)
Monocytes Relative: 9 % (ref 3–12)
NEUTROS ABS: 8.6 10*3/uL — AB (ref 1.7–7.7)
Neutrophils Relative %: 81 % — ABNORMAL HIGH (ref 43–77)
PLATELETS: 295 10*3/uL (ref 150–400)
RBC: 3.94 MIL/uL (ref 3.87–5.11)
RDW: 13.4 % (ref 11.5–15.5)
WBC: 10.6 10*3/uL — AB (ref 4.0–10.5)

## 2013-07-18 LAB — COMPREHENSIVE METABOLIC PANEL
ALT: 9 U/L (ref 0–35)
AST: 13 U/L (ref 0–37)
Albumin: 3.1 g/dL — ABNORMAL LOW (ref 3.5–5.2)
Alkaline Phosphatase: 84 U/L (ref 39–117)
BILIRUBIN TOTAL: 0.8 mg/dL (ref 0.3–1.2)
BUN: 12 mg/dL (ref 6–23)
CHLORIDE: 95 meq/L — AB (ref 96–112)
CO2: 32 meq/L (ref 19–32)
Calcium: 8.9 mg/dL (ref 8.4–10.5)
Creatinine, Ser: 0.76 mg/dL (ref 0.50–1.10)
GFR calc Af Amer: >90 mL/min (ref >90)
GFR, EST NON AFRICAN AMERICAN: 78 mL/min — AB (ref >90)
Glucose, Bld: 159 mg/dL — ABNORMAL HIGH (ref 70–99)
Potassium: 3.3 mEq/L — ABNORMAL LOW (ref 3.7–5.3)
SODIUM: 139 meq/L (ref 137–147)
Total Protein: 7.4 g/dL (ref 6.0–8.3)

## 2013-07-18 LAB — GLUCOSE, CAPILLARY: GLUCOSE-CAPILLARY: 176 mg/dL — AB (ref 70–99)

## 2013-07-18 LAB — URINALYSIS, ROUTINE W REFLEX MICROSCOPIC
BILIRUBIN URINE: NEGATIVE
Glucose, UA: NEGATIVE mg/dL
KETONES UR: NEGATIVE mg/dL
Leukocytes, UA: NEGATIVE
NITRITE: NEGATIVE
Protein, ur: NEGATIVE mg/dL
SPECIFIC GRAVITY, URINE: 1.014 (ref 1.005–1.030)
Urobilinogen, UA: 1 mg/dL (ref 0.0–1.0)
pH: 6.5 (ref 5.0–8.0)

## 2013-07-18 LAB — CG4 I-STAT (LACTIC ACID): Lactic Acid, Venous: 0.92 mmol/L (ref 0.5–2.2)

## 2013-07-18 LAB — URINE MICROSCOPIC-ADD ON

## 2013-07-18 MED ORDER — SODIUM CHLORIDE 0.9 % IV SOLN
INTRAVENOUS | Status: DC
  Administered 2013-07-18: via INTRAVENOUS

## 2013-07-18 MED ORDER — ACETAMINOPHEN 500 MG PO TABS: 1000.0000 mg | ORAL_TABLET | Freq: Once | ORAL | Status: DC

## 2013-07-18 MED ORDER — OSELTAMIVIR PHOSPHATE 6 MG/ML PO SUSR
30.0000 mg | Freq: Two times a day (BID) | ORAL | Status: DC
  Filled 2013-07-18 (×2): qty 5

## 2013-07-18 MED ORDER — SENNOSIDES-DOCUSATE SODIUM 8.6-50 MG PO TABS
1.0000 | ORAL_TABLET | Freq: Two times a day (BID) | ORAL | Status: DC
  Administered 2013-07-18 – 2013-07-22 (×8): 1 via ORAL
  Filled 2013-07-18 (×9): qty 1

## 2013-07-18 MED ORDER — GUAIFENESIN ER 600 MG PO TB12
600.0000 mg | ORAL_TABLET | Freq: Two times a day (BID) | ORAL | Status: DC | PRN
  Administered 2013-07-18 – 2013-07-21 (×2): 600 mg via ORAL
  Filled 2013-07-18 (×2): qty 1

## 2013-07-18 MED ORDER — ENOXAPARIN SODIUM 40 MG/0.4ML ~~LOC~~ SOLN
40.0000 mg | SUBCUTANEOUS | Status: DC
Start: 2013-07-18 — End: 2013-07-22
  Administered 2013-07-18 – 2013-07-21 (×4): 40 mg via SUBCUTANEOUS
  Filled 2013-07-18 (×5): qty 0.4

## 2013-07-18 MED ORDER — INSULIN ASPART 100 UNIT/ML ~~LOC~~ SOLN
0.0000 [IU] | Freq: Three times a day (TID) | SUBCUTANEOUS | Status: DC
  Administered 2013-07-19: 1 [IU] via SUBCUTANEOUS

## 2013-07-18 MED ORDER — VANCOMYCIN HCL IN DEXTROSE 1-5 GM/200ML-% IV SOLN
1000.0000 mg | Freq: Once | INTRAVENOUS | Status: DC
  Filled 2013-07-18: qty 200

## 2013-07-18 MED ORDER — FUROSEMIDE 40 MG PO TABS
40.0000 mg | ORAL_TABLET | Freq: Every morning | ORAL | Status: DC
  Administered 2013-07-19: 40 mg via ORAL
  Filled 2013-07-18: qty 1

## 2013-07-18 MED ORDER — OSELTAMIVIR PHOSPHATE 12 MG/ML PO SUSR: 75.0000 mg | Freq: Two times a day (BID) | ORAL | Status: DC

## 2013-07-18 MED ORDER — VANCOMYCIN HCL IN DEXTROSE 750-5 MG/150ML-% IV SOLN
750.0000 mg | INTRAVENOUS | Status: DC
  Administered 2013-07-18 – 2013-07-19 (×2): 750 mg via INTRAVENOUS
  Filled 2013-07-18 (×3): qty 150

## 2013-07-18 MED ORDER — LOSARTAN POTASSIUM 25 MG PO TABS
25.0000 mg | ORAL_TABLET | Freq: Every morning | ORAL | Status: DC
  Administered 2013-07-19: 25 mg via ORAL
  Filled 2013-07-18: qty 1

## 2013-07-18 MED ORDER — PIPERACILLIN-TAZOBACTAM 3.375 G IVPB 30 MIN
3.3750 g | Freq: Once | INTRAVENOUS | Status: AC
  Administered 2013-07-18: 3.375 g via INTRAVENOUS
  Filled 2013-07-18: qty 50

## 2013-07-18 MED ORDER — SODIUM CHLORIDE 0.9 % IV BOLUS (SEPSIS)
1000.0000 mL | Freq: Once | INTRAVENOUS | Status: AC
  Administered 2013-07-18: 1000 mL via INTRAVENOUS

## 2013-07-18 MED ORDER — POTASSIUM CHLORIDE CRYS ER 20 MEQ PO TBCR
30.0000 meq | EXTENDED_RELEASE_TABLET | Freq: Once | ORAL | Status: AC
  Administered 2013-07-18: 30 meq via ORAL
  Filled 2013-07-18: qty 1

## 2013-07-18 MED ORDER — OSELTAMIVIR PHOSPHATE 75 MG PO CAPS
75.0000 mg | ORAL_CAPSULE | Freq: Once | ORAL | Status: AC
  Administered 2013-07-18: 75 mg via ORAL
  Filled 2013-07-18: qty 1

## 2013-07-18 MED ORDER — DEXTROSE 5 % IV SOLN
1.0000 g | Freq: Two times a day (BID) | INTRAVENOUS | Status: DC
  Administered 2013-07-19 – 2013-07-22 (×7): 1 g via INTRAVENOUS
  Filled 2013-07-18 (×7): qty 1

## 2013-07-18 MED ORDER — TRAMADOL HCL 50 MG PO TABS
50.0000 mg | ORAL_TABLET | Freq: Four times a day (QID) | ORAL | Status: DC | PRN
  Administered 2013-07-18 – 2013-07-19 (×3): 50 mg via ORAL
  Filled 2013-07-18 (×3): qty 1

## 2013-07-18 MED ORDER — ASPIRIN 81 MG PO CHEW
81.0000 mg | CHEWABLE_TABLET | Freq: Every day | ORAL | Status: DC
  Administered 2013-07-18 – 2013-07-22 (×5): 81 mg via ORAL
  Filled 2013-07-18 (×5): qty 1

## 2013-07-18 NOTE — ED Notes (Signed)
Pt BIB EMS. Pt is from home. Pt c/o fever, chills, body aches, and productive cough. Pt had temp 102.1 per EMS. Pt was given Tylenol 1000mg  PO by EMS. Pt alert, no acute distress. Pt arrives with family members.

## 2013-07-18 NOTE — ED Notes (Signed)
Bed: WA17 Expected date:  Expected time:  Means of arrival:  Comments: EMS- elderly, flu like symptoms

## 2013-07-18 NOTE — ED Provider Notes (Signed)
CSN: [id number]     Arrival date & time 07/18/13  1817 History   First MD Initiated Contact with Patient 07/18/13 1829     Chief Complaint  Patient presents with  . Flu Symptoms    (Consider location/radiation/quality/duration/timing/severity/associated sxs/prior Treatment) The history is provided by the patient. The history is limited by a language barrier. A language interpreter was used.  Margaret [name] is a 80 y.o. female hx of HTN, DM, recent spinal fracture here with fever. She recently had a spinal fracture and just got discharged from [facility] for rehabilitation 2 days ago. She is at home now and as per family patient has fever and body aches and productive cough today. No nausea or vomiting but has decreased urine output. There was several cases of flu and pneumonia at [facility] recently. She was given tylenol by EMS.    Past Medical History  Diagnosis Date  . Hypertension   . Diabetes mellitus   . Gout   . Chronic diastolic heart failure    Past Surgical History  Procedure Laterality Date  . No past surgeries     No family history on file. History  Substance Use Topics  . Smoking status: Former Games developer  . Smokeless tobacco: Not on file  . Alcohol Use: No   OB History   Grav Para Term Preterm Abortions TAB SAB Ect Mult Living                 Review of Systems  Constitutional: Positive for fever and chills.  Respiratory: Positive for cough.   All other systems reviewed and are negative.    Allergies  Review of patient's allergies indicates no known allergies.  Home Medications   Current Outpatient Rx  Name  Route  Sig  Dispense  Refill  . furosemide (LASIX) 40 MG tablet   Oral   Take 40 mg by mouth every morning.          Marland Kitchen guaiFENesin (MUCINEX) 600 MG 12 hr tablet   Oral   Take 600 mg by mouth 2 (two) times daily as needed for to loosen phlegm.         Marland Kitchen HYDROcodone-homatropine (HYCODAN) 5-1.5 MG/5ML syrup   Oral   Take 5 mLs by mouth every 6  (six) hours as needed for cough.         . losartan (COZAAR) 25 MG tablet   Oral   Take 25 mg by mouth every morning.          . metFORMIN (GLUCOPHAGE-XR) 500 MG 24 hr tablet   Oral   Take 500 mg by mouth every evening.          . traMADol-acetaminophen (ULTRACET) 37.5-325 MG per tablet      Take one tablet by mouth every four hours as needed for pain; Take two tablets by mouth every four hours as needed for severe pain   360 tablet   5   . aspirin 81 MG chewable tablet   Oral   Chew 1 tablet (81 mg total) by mouth daily.         Marland Kitchen oseltamivir (TAMIFLU) 12 MG/ML suspension   Oral   Take 75 mg by mouth daily. Give 1 cup by mouth for 7 days -prophylaxis. To start on 07/06/13          BP 117/63  Pulse 94  Temp(Src) 99.5 F (37.5 C) (Oral)  Resp 24  SpO2 93% Physical Exam  Nursing note and  vitals reviewed. Constitutional:  Chronically ill, tired   HENT:  Head: Normocephalic.  OP slightly dry   Eyes: Conjunctivae are normal. Pupils are equal, round, and reactive to light.  Neck: Normal range of motion. Neck supple.  Cardiovascular: Regular rhythm and normal heart sounds.   + tachy   Pulmonary/Chest: Effort normal.  Crackles bilateral bases, worse L side   Abdominal: Soft. Bowel sounds are normal. She exhibits no distension. There is no tenderness. There is no rebound.  Neurological: She is alert.  Tired, nl strength and sensation   Skin: Skin is warm and dry.  Psychiatric: She has a normal mood and affect. Her behavior is normal. Judgment and thought content normal.    ED Course  Procedures (including critical care time) Labs Review Labs Reviewed  CBC WITH DIFFERENTIAL - Abnormal; Notable for the following:    WBC 10.6 (*)    Hemoglobin 11.3 (*)    HCT 34.0 (*)    Neutrophils Relative % 81 (*)    Neutro Abs 8.6 (*)    Lymphocytes Relative 10 (*)    All other components within normal limits  COMPREHENSIVE METABOLIC PANEL - Abnormal; Notable for the  following:    Potassium 3.3 (*)    Chloride 95 (*)    Glucose, Bld 159 (*)    Albumin 3.1 (*)    GFR calc non Af Amer 78 (*)    All other components within normal limits  URINALYSIS, ROUTINE W REFLEX MICROSCOPIC - Abnormal; Notable for the following:    Hgb urine dipstick MODERATE (*)    All other components within normal limits  URINE MICROSCOPIC-ADD ON - Abnormal; Notable for the following:    Squamous Epithelial / LPF PENDING PATHOLOGIST REVIEW (*)    Casts HYALINE CASTS (*)    All other components within normal limits  CULTURE, BLOOD (ROUTINE X 2)  CULTURE, BLOOD (ROUTINE X 2)  INFLUENZA PANEL BY PCR (TYPE A & B, H1N1)  PATHOLOGIST SMEAR REVIEW  CG4 I-STAT (LACTIC ACID)   Imaging Review Dg Chest 2 View  07/18/2013   CLINICAL DATA:  Shortness of breath, cough and weakness.  EXAM: CHEST  2 VIEW  COMPARISON:  Chest radiograph July 16, 2013  FINDINGS: Cardiac silhouette is unremarkable, tortuous, calcified and possibly ectatic aorta. Mild chronic interstitial changes, no pleural effusions. Slightly increasing strandy densities are right lung base. Pneumothorax.  Scoliosis. Osteopenia with chronic approximate L1 severe compression fracture.  IMPRESSION: Mild chronic interstitial changes with increasing strandy densities in right lung base suggesting atelectasis or even very early pneumonia.   Electronically Signed   By: Awilda Metroourtnay  Bloomer   On: 07/18/2013 19:14    EKG Interpretation   None       MDM  No diagnosis found. Margaret [name] is a 80 y.o. female here with fever, chills. Febrile, hypoxic here. I am concerned for possible pneumonia vs flu. Will do sepsis workup. Will place on oxygen. Will likely need admission.   8:39 PM CXR showed possible pneumonia, given vanc/zosyn. Flu sent, started on tamiflu empirically. Will admit to tele.   Richardean Canalavid H Ayinde Swim, MD 07/18/13 2040

## 2013-07-18 NOTE — ED Notes (Signed)
Patient returned from X-ray 

## 2013-07-18 NOTE — H&P (Addendum)
History and Physical    Margaret [name] GNF:[id number] DOB: 09-05-1931 DOA: 07/18/2013  Referring physician: Dr. Silverio Lay PCP: Pcp Not In System  Specialists: none  Chief Complaint: Fevers  HPI: Margaret [name] is a 80 y.o. female has a past medical history significant for recent fall for spinal fracture, hospitalized here and discharge to nursing home, and discharge from a nursing home home about 2 days ago, history of hypertension, diabetes, who presents to the emergency room with a chief complaint of fever for the last day. Patient does not speak English and the family has been helping in translation. Patient endorses fever and body aches and productive cough today. She tells me that several people at [facility] had been having flulike symptoms and she was treated with Tamiflu over there. She denies any chest pain. Mild shortness of breath resolved after 2 L of nasal cannula. Denies any nausea or vomiting. Endorses severe constipation hasn't had a bowel movement in a week. Endorses decreased urine output for the past day.  Review of Systems: As per history of present illness otherwise negative  Past Medical History  Diagnosis Date  . Hypertension   . Diabetes mellitus   . Gout   . Chronic diastolic heart failure    Past Surgical History  Procedure Laterality Date  . No past surgeries     Social History:  reports that she has quit smoking. She does not have any smokeless tobacco history on file. She reports that she does not drink alcohol or use illicit drugs.  No Known Allergies  History reviewed. No pertinent family history.   Prior to Admission medications   Medication Sig Start Date End Date Taking? Authorizing Provider  furosemide (LASIX) 40 MG tablet Take 40 mg by mouth every morning.  04/08/13  Yes Historical Provider, MD  guaiFENesin (MUCINEX) 600 MG 12 hr tablet Take 600 mg by mouth 2 (two) times daily as needed for to loosen phlegm.   Yes Historical Provider, MD    HYDROcodone-homatropine (HYCODAN) 5-1.5 MG/5ML syrup Take 5 mLs by mouth every 6 (six) hours as needed for cough.   Yes Historical Provider, MD  losartan (COZAAR) 25 MG tablet Take 25 mg by mouth every morning.    Yes Historical Provider, MD  metFORMIN (GLUCOPHAGE-XR) 500 MG 24 hr tablet Take 500 mg by mouth every evening.    Yes Historical Provider, MD  traMADol-acetaminophen (ULTRACET) 37.5-325 MG per tablet Take one tablet by mouth every four hours as needed for pain; Take two tablets by mouth every four hours as needed for severe pain 06/25/13  Yes Kimber Relic, MD  aspirin 81 MG chewable tablet Chew 1 tablet (81 mg total) by mouth daily. 06/24/13   Hollice Espy, MD  oseltamivir (TAMIFLU) 12 MG/ML suspension Take 75 mg by mouth daily. Give 1 cup by mouth for 7 days -prophylaxis. To start on 07/06/13    Historical Provider, MD   Physical Exam: Filed Vitals:   07/18/13 1826 07/18/13 1922 07/18/13 2043 07/18/13 2110  BP:  117/63 124/71   Pulse:  94 86   Temp:  99.5 F (37.5 C) 98.3 F (36.8 C)   TempSrc:  Oral Oral   Resp:  24 19   Height:    4\' 9"  (1.448 m)  Weight:    49.896 kg (110 lb)  SpO2: 92% 93% 94%      General:  No apparent distress  Eyes: PERRL, EOMI, no scleral icterus  ENT: moist oropharynx  Neck: supple, no JVD  Cardiovascular: regular rate without MRG; 2+ peripheral pulses  Respiratory: CTA biL, good air movement without wheezing  Abdomen: soft, non tender to palpation, positive bowel sounds, no guarding, no rebound  Skin: no rashes  Musculoskeletal: no peripheral edema  Psychiatric: normal mood and affect  Neurologic: Grossly nonfocal  Labs on Admission:  Basic Metabolic Panel:  Recent Labs Lab 07/18/13 1910  NA 139  K 3.3*  CL 95*  CO2 32  GLUCOSE 159*  BUN 12  CREATININE 0.76  CALCIUM 8.9   Liver Function Tests:  Recent Labs Lab 07/18/13 1910  AST 13  ALT 9  ALKPHOS 84  BILITOT 0.8  PROT 7.4  ALBUMIN 3.1*    CBC:  Recent Labs Lab 07/18/13 1910  WBC 10.6*  NEUTROABS 8.6*  HGB 11.3*  HCT 34.0*  MCV 86.3  PLT 295   BNP (last 3 results)  Recent Labs  06/21/13 1950  PROBNP 157.5   Rdiological Exams on Admission: Dg Chest 2 View  07/18/2013   CLINICAL DATA:  Shortness of breath, cough and weakness.  EXAM: CHEST  2 VIEW  COMPARISON:  Chest radiograph July 16, 2013  FINDINGS: Cardiac silhouette is unremarkable, tortuous, calcified and possibly ectatic aorta. Mild chronic interstitial changes, no pleural effusions. Slightly increasing strandy densities are right lung base. Pneumothorax.  Scoliosis. Osteopenia with chronic approximate L1 severe compression fracture.  IMPRESSION: Mild chronic interstitial changes with increasing strandy densities in right lung base suggesting atelectasis or even very early pneumonia.   Electronically Signed   By: Awilda Metroourtnay  Bloomer   On: 07/18/2013 19:14    EKG: Independently reviewed.  Assessment/Plan Principal Problem:   HCAP (healthcare-associated pneumonia) Active Problems:   Leukocytosis   Hypertension   Diabetes mellitus due to underlying condition   Chronic diastolic heart failure   HCAP - patient febrile in the emergency room, mild leukocytosis, shortness of breath, and a chest x-ray with possible pneumonia. Start vancomycin and cefepime. Rule out influenza, empiric Tamiflu. Hypertension - restart losartan and Lasix tomorrow. Diabetes mellitus - sliding scale insulin. Last hospitalization A1c was checked and it was 6.0 Chronic diastolic heart failure - restart Lasix tomorrow History of CVAs - previous MRI with multiple old strokes. Continue aspirin. T12 fracture at the previous hospitalization - this seems stable. Will order PT consult.   Diet: Heart healthy Fluids: None DVT Prophylaxis:  Lovenox  Code Status: Full  Family Communication: Daughter at bedside  Disposition Plan: Inpatient  Time spent: 870  Sachi Boulay M. Elvera LennoxGherghe, MD [facility] Pager [phone]  If 7PM-7AM, please contact night-coverage [email or web address] Password [email or web address] 07/18/2013, 9:26 PM

## 2013-07-18 NOTE — Progress Notes (Signed)
ANTIBIOTIC CONSULT NOTE - INITIAL  Pharmacy Consult for vancomycin, cefepime Indication: HCAP  No Known Allergies  Patient Measurements: Height: 4\' 9"  (144.8 cm) Weight: 110 lb (49.896 kg) IBW/kg (Calculated) : 38.6  Vital Signs: Temp: 98.3 F (36.8 C) (01/22 2043) Temp src: Oral (01/22 2043) BP: 124/71 mmHg (01/22 2043) Pulse Rate: 86 (01/22 2043) Intake/Output from previous day:   Intake/Output from this shift:    Labs:  Recent Labs  07/18/13 1910  WBC 10.6*  HGB 11.3*  PLT 295  CREATININE 0.76   Estimated Creatinine Clearance: 38.8 ml/min (by C-G formula based on Cr of 0.76). No results found for this basename: Rolm GalaVANCOTROUGH, VANCOPEAK, VANCORANDOM, GENTTROUGH, GENTPEAK, GENTRANDOM, TOBRATROUGH, TOBRAPEAK, TOBRARND, AMIKACINPEAK, AMIKACINTROU, AMIKACIN,  in the last 72 hours   Microbiology: Recent Results (from the past 720 hour(s))  URINE CULTURE     Status: None   Collection Time    06/21/13  4:29 PM      Result Value Range Status   Specimen Description URINE, CLEAN CATCH   Final   Special Requests NONE   Final   Culture  Setup Time     Final   Value: 06/22/2013 00:19     [facility]formed at [facility]   Colony Count     Final   Value: 75,000 COLONIES/ML     [facility] at [facility]   Culture     Final   Value: Multiple bacterial morphotypes present, none predominant. Suggest appropriate recollection if clinically indicated.     [facility]formed at [facility]   Report Status 06/23/2013 FINAL   Final    Medical History: Past Medical History  Diagnosis Date  . Hypertension   . Diabetes mellitus   . Gout   . Chronic diastolic heart failure     Medications:  Scheduled:  . [START ON 07/19/2013] ceFEPime (MAXIPIME) IV  1 g Intravenous Q12H  . enoxaparin (LOVENOX) injection  40 mg Subcutaneous Q24H  . [START ON 07/19/2013] insulin aspart  0-9 Units Subcutaneous TID WC  . vancomycin  1,000 mg Intravenous Once   Infusions:  . sodium  chloride     Assessment: 80 yo female presented to ER with fever, flu-like symptoms and [facility]. Patient with hx DM, HTN, recent spinal fx. To start vancomycin and cefepime per pharmacy dosing x 8 days for possible HCAP. Afebrile at admission. WBC slightly elevated at 10.6. SCr 0.76 with estimated CrCl of 39 ml/min  Goal of Therapy:  Vancomycin trough level 15-20 mcg/ml  Plan:  1) Vancomycin 1g ordered in ER but has yet to Chrisma charted as given at this time 2) Will change vancomycin to 750mg  IV q24. 3) per CrCl 30-50 ml/min, will change cefepime to 1g q12 from q8  Hessie KnowsJustin M Zach Tietje, PharmD, BCPS Pager [phone] 07/18/2013 9:42 PM

## 2013-07-18 NOTE — ED Notes (Signed)
MD at bedside. Hospitalist at bedside. 

## 2013-07-19 ENCOUNTER — Inpatient Hospital Stay (HOSPITAL_COMMUNITY): Payer: Medicare Other

## 2013-07-19 ENCOUNTER — Encounter (HOSPITAL_COMMUNITY): Payer: Self-pay

## 2013-07-19 DIAGNOSIS — I10 Essential (primary) hypertension: Secondary | ICD-10-CM | POA: Diagnosis not present

## 2013-07-19 DIAGNOSIS — E119 Type 2 diabetes mellitus without complications: Secondary | ICD-10-CM | POA: Diagnosis not present

## 2013-07-19 DIAGNOSIS — M7989 Other specified soft tissue disorders: Secondary | ICD-10-CM

## 2013-07-19 DIAGNOSIS — I5032 Chronic diastolic (congestive) heart failure: Secondary | ICD-10-CM

## 2013-07-19 DIAGNOSIS — M79609 Pain in unspecified limb: Secondary | ICD-10-CM | POA: Diagnosis not present

## 2013-07-19 DIAGNOSIS — E139 Other specified diabetes mellitus without complications: Secondary | ICD-10-CM | POA: Diagnosis not present

## 2013-07-19 DIAGNOSIS — I509 Heart failure, unspecified: Secondary | ICD-10-CM | POA: Diagnosis not present

## 2013-07-19 DIAGNOSIS — J189 Pneumonia, unspecified organism: Secondary | ICD-10-CM | POA: Diagnosis not present

## 2013-07-19 DIAGNOSIS — R109 Unspecified abdominal pain: Secondary | ICD-10-CM | POA: Diagnosis not present

## 2013-07-19 HISTORY — DX: Chronic diastolic (congestive) heart failure: I50.32

## 2013-07-19 LAB — INFLUENZA PANEL BY PCR (TYPE A & B)
H1N1FLUPCR: NOT DETECTED
Influenza A By PCR: NEGATIVE
Influenza B By PCR: NEGATIVE

## 2013-07-19 LAB — BASIC METABOLIC PANEL
BUN: 9 mg/dL (ref 6–23)
CO2: 30 mEq/L (ref 19–32)
Calcium: 8.1 mg/dL — ABNORMAL LOW (ref 8.4–10.5)
Chloride: 99 mEq/L (ref 96–112)
Creatinine, Ser: 0.69 mg/dL (ref 0.50–1.10)
GFR calc non Af Amer: 81 mL/min — ABNORMAL LOW (ref >90)
Glucose, Bld: 81 mg/dL (ref 70–99)
POTASSIUM: 3.4 meq/L — AB (ref 3.7–5.3)
Sodium: 139 mEq/L (ref 137–147)

## 2013-07-19 LAB — GLUCOSE, CAPILLARY
GLUCOSE-CAPILLARY: 121 mg/dL — AB (ref 70–99)
Glucose-Capillary: 92 mg/dL (ref 70–99)

## 2013-07-19 LAB — CBC
HCT: 30.5 % — ABNORMAL LOW (ref 36.0–46.0)
Hemoglobin: 9.8 g/dL — ABNORMAL LOW (ref 12.0–15.0)
MCH: 28.2 pg (ref 26.0–34.0)
MCHC: 32.1 g/dL (ref 30.0–36.0)
MCV: 87.9 fL (ref 78.0–100.0)
PLATELETS: 222 10*3/uL (ref 150–400)
RBC: 3.47 MIL/uL — ABNORMAL LOW (ref 3.87–5.11)
RDW: 13.6 % (ref 11.5–15.5)
WBC: 7.2 10*3/uL (ref 4.0–10.5)

## 2013-07-19 LAB — LEGIONELLA ANTIGEN, URINE: Legionella Antigen, Urine: NEGATIVE

## 2013-07-19 LAB — TROPONIN I: Troponin I: <0.3 ng/mL (ref <0.30)

## 2013-07-19 LAB — STREP PNEUMONIAE URINARY ANTIGEN: Strep Pneumo Urinary Antigen: NEGATIVE

## 2013-07-19 MED ORDER — DOCUSATE SODIUM 100 MG PO CAPS: 100.0000 mg | ORAL_CAPSULE | Freq: Two times a day (BID) | ORAL | Status: DC

## 2013-07-19 MED ORDER — OSELTAMIVIR PHOSPHATE 75 MG PO CAPS
75.0000 mg | ORAL_CAPSULE | Freq: Every day | ORAL | Status: DC
  Administered 2013-07-19: 75 mg via ORAL
  Filled 2013-07-19: qty 1

## 2013-07-19 MED ORDER — POTASSIUM CHLORIDE CRYS ER 20 MEQ PO TBCR
20.0000 meq | EXTENDED_RELEASE_TABLET | Freq: Once | ORAL | Status: AC
  Administered 2013-07-19: 20 meq via ORAL
  Filled 2013-07-19: qty 1

## 2013-07-19 NOTE — Progress Notes (Signed)
[facility] PROGRESS NOTE  Margaret [name] ZOX:[id number] DOB: May 19, 1932 DOA: 07/18/2013 PCP: Pcp Not In System  Assessment/Plan: HCAP -Continue vancomycin and cefepime pending culture data -Influenza PCR negative Diabetes mellitus type 2 -Hemoglobin A1c 6.0 on 06/21/2013 -Discontinue CBG checks Hypertension -hold losartan due to soft BP Chronic diastolic CHF -Appears compensated -06/23/2014 Echo shows EF 65-70%, grade 1 diastolic dysfunction -Restart furosemide 07/20/13 if stable History of CVA -Continue aspirin -PT evaluation-->SNF Leg pain/edema -Venous duplex will DVT Chest discomfort -EKG -Troponins Abdominal pain -2 view abdominal x-ray Hypokalemia -Replete -Check magnesium Family Communication:   Pt at beside Disposition Plan:   SNF when medically stable    Antibiotics:  Vancomycin 07/18/2013>>>  Cefepime 07/18/2013>>>    Procedures/Studies: Dg Chest 2 View  07/18/2013   CLINICAL DATA:  Shortness of breath, cough and weakness.  EXAM: CHEST  2 VIEW  COMPARISON:  Chest radiograph July 16, 2013  FINDINGS: Cardiac silhouette is unremarkable, tortuous, calcified and possibly ectatic aorta. Mild chronic interstitial changes, no pleural effusions. Slightly increasing strandy densities are right lung base. Pneumothorax.  Scoliosis. Osteopenia with chronic approximate L1 severe compression fracture.  IMPRESSION: Mild chronic interstitial changes with increasing strandy densities in right lung base suggesting atelectasis or even very early pneumonia.   Electronically Signed   By: Awilda Metro   On: 07/18/2013 19:14   Dg Chest 2 View  06/21/2013   CLINICAL DATA:  Possible abnormal soft tissue density within the upper lungs on a previous outside CT scan.  EXAM: CHEST  2 VIEW  COMPARISON:  Rib detail study of Nov 11, 2012.  FINDINGS: The lungs are adequately inflated. There is no focal infiltrate. There are coarse lung markings in the lingula which are not entirely  new. There is no pleural effusion. The cardiopericardial silhouette is enlarged. The pulmonary vascularity is mildly prominent centrally. The observed portions of the bony thorax exhibit osteopenia. There is curvature of the thoracolumbar spine in an S shaped configuration.  IMPRESSION: 1. No definite upper lobe mass or other abnormality is demonstrated. 2. The findings suggest mild pulmonary interstitial edema likely of cardiac cause. 3. Atelectasis versus scarring in the lingula is present.   Electronically Signed   By: Djeneba Barsch  [place]   On: 06/21/2013 21:01   Dg Lumbar Spine Complete  06/21/2013   CLINICAL DATA:  Low back pain.  Fall.  EXAM: LUMBAR SPINE - COMPLETE 4+ VIEW  COMPARISON:  CT 03/31/2012.  FINDINGS: T12 compression fracture is present with 50% loss of vertebral body height. Minimal retropulsion of the superior endplate. This appears new compared to the prior CT of 2013. Aortoiliac atherosclerosis. Osteopenia. Lumbar vertebral body height is preserved. There are 5 lumbar type vertebral bodies. Levoconvex curve of the lumbar spine.  IMPRESSION: Likely acute T12 compression fracture with 50% loss of vertebral body height and mild retropulsion. Underlying osteopenia.   Electronically Signed   By: Andreas Newport M.D.   On: 06/21/2013 16:19   Dg Pelvis 1-2 Views  06/21/2013   CLINICAL DATA:  Fall.  Right-sided pain.  EXAM: PELVIS - 1-2 VIEW  COMPARISON:  CT 03/31/2012.  FINDINGS: Old healed left obturator ring fracture is present. Dense phlebolith in the left anatomic pelvis. Osteopenia. There is no displaced acute fracture identified on the single view of the pelvis. Left obturator ring fracture was demonstrated on prior CT.  IMPRESSION: 1. Osteopenia without an acute osseous injury. 2. Old left obturator ring fracture, healed.   Electronically Signed   By: Andreas Newport  M.D.   On: 06/21/2013 16:17   Ct Head Wo Contrast  06/21/2013   CLINICAL DATA:  Status post fall  EXAM: CT HEAD WITHOUT  CONTRAST  CT CERVICAL SPINE WITHOUT CONTRAST  TECHNIQUE: Multidetector CT imaging of the head and cervical spine was performed following the standard protocol without intravenous contrast. Multiplanar CT image reconstructions of the cervical spine were also generated.  COMPARISON:  CT scan of the brain and cervical spine dated October 12, 2007.  FINDINGS: CT HEAD FINDINGS  There is mild diffuse cerebral and cerebellar atrophy with compensatory ventriculomegaly. These findings appear stable. There is an old lacunar infarction in the region of the genu of the internal capsule on the right. In the anterior aspect of the basal ganglia on the right there is evidence of an old lacunar infarction as well. Small lacunar infarctions in the periphery of the left basal ganglia are demonstrated. There is decreased density in the deep white matter of both cerebral hemispheres consistent with chronic small vessel ischemic type change. There is no shift of the midline. There is no evidence of an acute intracranial hemorrhage. There is no evidence of an evolving ischemic infarction. The cerebellum and brainstem exhibit no acute abnormalities.  At bone window settings the observed portions of the paranasal sinuses exhibit no air-fluid levels. The mastoid air cells appear somewhat hypoplastic bilaterally. There are postsurgical changes in the right mastoid bone. There is no evidence of an acute skull fracture. There is no definite cephalohematoma.  CT CERVICAL SPINE FINDINGS  The cervical vertebral bodies are preserved in height. There is disc space narrowing at C5-6 with a prominent anterior endplate osteophyte nearly bridging the disc space. There are small posterior osteophytes at C3-4 and at C4-5. The prevertebral soft tissue spaces appear normal. There is no evidence of a perched facet nor spinous process fracture. The bony ring at each cervical level is intact. The observed portions of the 1st and 2nd ribs appear normal. There is  apical pleural scarring bilaterally. There is a stable pulmonary nodule in the left upper lobe laterally just under 7 mm in greatest dimension.  IMPRESSION: 1. There is no evidence of an acute intracranial hemorrhage nor of an evolving ischemic event within the brain. There are extensive chronic changes consistent with small vessel ischemia and old lacunar infarctions. 2. There is no evidence of an acute cervical spine fracture nor dislocation. There is mild degenerative disc change at multiple levels which appears stable. 3. There is a stable soft tissue density nodule in the upper lobe of the left lung.   Electronically Signed   By: Sharmel Ballantine  [place]   On: 06/21/2013 16:14   Ct Cervical Spine Wo Contrast  06/21/2013   CLINICAL DATA:  Status post fall  EXAM: CT HEAD WITHOUT CONTRAST  CT CERVICAL SPINE WITHOUT CONTRAST  TECHNIQUE: Multidetector CT imaging of the head and cervical spine was performed following the standard protocol without intravenous contrast. Multiplanar CT image reconstructions of the cervical spine were also generated.  COMPARISON:  CT scan of the brain and cervical spine dated October 12, 2007.  FINDINGS: CT HEAD FINDINGS  There is mild diffuse cerebral and cerebellar atrophy with compensatory ventriculomegaly. These findings appear stable. There is an old lacunar infarction in the region of the genu of the internal capsule on the right. In the anterior aspect of the basal ganglia on the right there is evidence of an old lacunar infarction as well. Small lacunar infarctions in the periphery of the left  basal ganglia are demonstrated. There is decreased density in the deep white matter of both cerebral hemispheres consistent with chronic small vessel ischemic type change. There is no shift of the midline. There is no evidence of an acute intracranial hemorrhage. There is no evidence of an evolving ischemic infarction. The cerebellum and brainstem exhibit no acute abnormalities.  At bone window  settings the observed portions of the paranasal sinuses exhibit no air-fluid levels. The mastoid air cells appear somewhat hypoplastic bilaterally. There are postsurgical changes in the right mastoid bone. There is no evidence of an acute skull fracture. There is no definite cephalohematoma.  CT CERVICAL SPINE FINDINGS  The cervical vertebral bodies are preserved in height. There is disc space narrowing at C5-6 with a prominent anterior endplate osteophyte nearly bridging the disc space. There are small posterior osteophytes at C3-4 and at C4-5. The prevertebral soft tissue spaces appear normal. There is no evidence of a perched facet nor spinous process fracture. The bony ring at each cervical level is intact. The observed portions of the 1st and 2nd ribs appear normal. There is apical pleural scarring bilaterally. There is a stable pulmonary nodule in the left upper lobe laterally just under 7 mm in greatest dimension.  IMPRESSION: 1. There is no evidence of an acute intracranial hemorrhage nor of an evolving ischemic event within the brain. There are extensive chronic changes consistent with small vessel ischemia and old lacunar infarctions. 2. There is no evidence of an acute cervical spine fracture nor dislocation. There is mild degenerative disc change at multiple levels which appears stable. 3. There is a stable soft tissue density nodule in the upper lobe of the left lung.   Electronically Signed   By: Miraya Cudney  [place]   On: 06/21/2013 16:14   Mr Brain Wo Contrast  06/21/2013   CLINICAL DATA:  Syncope, fall with headache and back pain, nausea. Prior mastoidectomy.  EXAM: MRI HEAD WITHOUT CONTRAST  TECHNIQUE: Multiplanar, multiecho pulse sequences of the brain and surrounding structures were obtained without intravenous contrast.  COMPARISON:  CT of the head June 21, 2013 at 1554 hr.  FINDINGS: No reduced diffusion to suggest acute ischemia.  Moderate ventriculomegaly, with mild disproportionate sulcal  effacement at the convexities. Bilateral basal ganglia fluid signal lacunar infarcts, addition to subcentimeter right thalamic remote lacunar infarcts with minimal surrounding gliosis. Prominent right inferior occipital sulcus suggests underlying parenchymal volume loss. Confluent supratentorial white matter FLAIR T2 hyperintensities without midline shift nor mass effect. Remote small left cerebellar infarct associated with microhemorrhage. Bilateral thalami punctate foci of susceptibility artifact, in addition to a few scattered foci of susceptibility artifact within the deep white matter. No intrinsic T1 shortening to suggest subacute blood products.  No abnormal extra-axial fluid collections. Normal major intracranial vascular flow voids observed at the skull base.  Status post right wall down mastoidectomy, with fluid signal within the left middle ear and mastoid air cells, and right middle ear. No suspicious calvarial bone marrow signal, generalized bright T1 bone marrow signal favors osteopenia. Fluid filled non expanded sella. Craniocervical junction maintained. Patient is edentulous. Status post bilateral ocular lens implants.  IMPRESSION: No acute intracranial process, specifically no evidence of acute ischemia.  Moderate to severe white matter changes suggest chronic small vessel ischemic disease with remote bilateral basal ganglia, right thalamic lacunar infarcts, and small remote left cerebellar hemorrhagic lacunar infarct. Scattered micro hemorrhages in the supratentorial brain favors sequelae of chronic hypertension.  Mild sulcal effacement at this convexities which can Aaliah  associated with normal pressure hydrocephalus.  Status post right wall down mastoidectomy with fluid within the bilateral middle ears and left mastoid air cells, which may reflect eustachian tube dysfunction.   Electronically Signed   By: Awilda Metroourtnay  [place]   On: 06/21/2013 22:06         Subjective: Patient complains of some  abdominal pain with constipation. She is breathing better. Denies any vomiting, diarrhea, dysuria, hematuria. Denies fevers or chills.  Objective: Filed Vitals:   07/18/13 2110 07/18/13 2123 07/18/13 2141 07/19/13 0507  BP:   115/55 109/60  Pulse:   89 81  Temp:   98.6 F (37 C) 98.6 F (37 C)  TempSrc:   Oral Oral  Resp:   18 18  Height: 4\' 9"  (1.448 m)  4\' 9"  (1.448 m)   Weight: 49.896 kg (110 lb)  47.8 kg (105 lb 6.1 oz) 46.8 kg (103 lb 2.8 oz)  SpO2:  99% 99% 97%    Intake/Output Summary (Last 24 hours) at 07/19/13 1339 Last data filed at 07/19/13 1100  Gross per 24 hour  Intake    160 ml  Output      1 ml  Net    159 ml   Weight change:  Exam:   General:  Pt is alert, follows commands appropriately, not in acute distress  HEENT: No icterus, No thrush,  [place]/AT  Cardiovascular: RRR, S1/S2, no rubs, no gallops  Respiratory: Bibasilar rales, left greater than right. Mild basilar wheezing. Good air movement.  Abdomen: Soft/+BS, lower abdominal tender without any rebound tenderness, non distended, no guarding  Extremities: 1+ Chestang edema, No lymphangitis, No petechiae, No rashes, no synovitis  Data Reviewed: Basic Metabolic Panel:  Recent Labs Lab 07/18/13 1910 07/19/13 0456  NA 139 139  K 3.3* 3.4*  CL 95* 99  CO2 32 30  GLUCOSE 159* 81  BUN 12 9  CREATININE 0.76 0.69  CALCIUM 8.9 8.1*   Liver Function Tests:  Recent Labs Lab 07/18/13 1910  AST 13  ALT 9  ALKPHOS 84  BILITOT 0.8  PROT 7.4  ALBUMIN 3.1*   No results found for this basename: LIPASE, AMYLASE,  in the last 168 hours No results found for this basename: AMMONIA,  in the last 168 hours CBC:  Recent Labs Lab 07/18/13 1910 07/19/13 0456  WBC 10.6* 7.2  NEUTROABS 8.6*  --   HGB 11.3* 9.8*  HCT 34.0* 30.5*  MCV 86.3 87.9  PLT 295 222   Cardiac Enzymes: No results found for this basename: CKTOTAL, CKMB, CKMBINDEX, TROPONINI,  in the last 168 hours BNP: No components found with  this basename: POCBNP,  CBG:  Recent Labs Lab 07/18/13 2245 07/19/13 0729 07/19/13 1215  GLUCAP 176* 92 121*    Recent Results (from the past 240 hour(s))  CULTURE, BLOOD (ROUTINE X 2)     Status: None   Collection Time    07/18/13  7:10 PM      Result Value Range Status   Specimen Description BLOOD RIGHT ARM   Final   Special Requests BOTTLES DRAWN AEROBIC AND ANAEROBIC 5ML EACH   Final   Culture  Setup Time     Final   Value: 07/18/2013 22:52     Performed at [facility]   Culture     Final   Value:        BLOOD CULTURE RECEIVED NO GROWTH TO DATE CULTURE WILL Shayra HELD FOR 5 DAYS BEFORE ISSUING A FINAL NEGATIVE REPORT  Performed at [facility]   Report Status PENDING   Incomplete  CULTURE, BLOOD (ROUTINE X 2)     Status: None   Collection Time    07/18/13  7:41 PM      Result Value Range Status   Specimen Description BLOOD LEFT ARM   Final   Special Requests BOTTLES DRAWN AEROBIC AND ANAEROBIC 4CC   Final   Culture  Setup Time     Final   Value: 07/18/2013 22:51     Performed at [facility]   Culture     Final   Value:        BLOOD CULTURE RECEIVED NO GROWTH TO DATE CULTURE WILL Makylah HELD FOR 5 DAYS BEFORE ISSUING A FINAL NEGATIVE REPORT     Performed at [facility]   Report Status PENDING   Incomplete     Scheduled Meds: . aspirin  81 mg Oral Daily  . ceFEPime (MAXIPIME) IV  1 g Intravenous Q12H  . enoxaparin (LOVENOX) injection  40 mg Subcutaneous Q24H  . insulin aspart  0-9 Units Subcutaneous TID WC  . losartan  25 mg Oral q morning - 10a  . oseltamivir  75 mg Oral Daily  . senna-docusate  1 tablet Oral BID  . vancomycin  750 mg Intravenous Q24H   Continuous Infusions:    Karianna Gusman, DO  [facility] Pager [phone]  If 7PM-7AM, please contact night-coverage [email or web address] Password [email or web address] 07/19/2013, 1:39 PM   LOS: 1 day

## 2013-07-19 NOTE — Progress Notes (Signed)
VASCULAR LAB PRELIMINARY  PRELIMINARY  PRELIMINARY  PRELIMINARY  Bilateral lower extremity venous duplex  completed.    Preliminary report:  Bilateral:  No evidence of DVT, superficial thrombosis, or Baker's Cyst.    Kyrel Leighton, RVT 07/19/2013, 3:12 PM

## 2013-07-19 NOTE — Evaluation (Signed)
Physical Therapy Evaluation Patient Details Name: Margaret [name] MRN: [id number] DOB: 1932-06-23 Today's Date: 07/19/2013 Time: [id number] PT Time Calculation (min): 14 min  PT Assessment / Plan / Recommendation History of Present Illness  80 y.o. female has a past medical history significant for recent fall for spinal fracture, hospitalized here and discharge to nursing home, and discharge from a nursing home home about 2 days ago, history of hypertension, diabetes, who presents to the emergency room with a chief complaint of fever for the last day. Patient does not speak English and the family has been helping in translation. Patient endorses fever and body aches and productive cough today. She tells me that several people [facility] had been having flulike symptoms and she was treated with Tamiflu over there. She denies any chest pain. Mild shortness of breath resolved after 2 L of nasal cannula. Denies any nausea or vomiting. Endorses severe constipation hasn't had a bowel movement in a week. Endorses decreased urine output for the past day.  Clinical Impression  Pt admitted with HCAP. Pt currently with functional limitations due to the deficits listed below (see PT Problem List).  Pt will benefit from skilled PT to increase their independence and safety with mobility to allow discharge to the venue listed below.  Female family member in room and assisted with interpreting however uncertain of d/c plans at this time.  Pt reports she has used TLSO brace but does not know location and has not been wearing (brace from Dec admission).  May need SNF upon d/c if family unable to provide assist.     PT Assessment  Patient needs continued PT services    Follow Up Recommendations  SNF;Supervision for mobility/OOB    Does the patient have the potential to tolerate intense rehabilitation      Barriers to Discharge        Equipment Recommendations  Other (comment) (unknown at this time)     Recommendations for Other Services     Frequency Min 3X/week    Precautions / Restrictions Precautions Precautions: Fall;Back Required Braces or Orthoses: Spinal Brace (per last admission in Dec) Spinal Brace: Thoracolumbosacral orthotic;Other (comment) (?per previous hospitalization) Spinal Brace Comments: pt states she does not know where her brace is located and has not been wearing Restrictions Weight Bearing Restrictions: No   Pertinent Vitals/Pain Reports increased back pain and feet pain.  Increased mobility and repositioned, floated heels with LEs on pillows and RN notified      Mobility  Bed Mobility Overal bed mobility: Needs Assistance Bed Mobility: Supine to Sit;Sit to Supine Supine to sit: Supervision Sit to supine: Mod assist General bed mobility comments: tactile cues for log roll technique and pt able to perform without assist however did not perform upon return to bed so assisted LEs due to hx of T12 fx Transfers Overall transfer level: Needs assistance Equipment used: 2 person hand held assist Transfers: Sit to/from Stand Sit to Stand: Mod assist;+2 safety/equipment General transfer comment: pt assisted to standing and family member assisted, had pt step up HOB, pt reports back pain and wished to return to supine so assisted back to bed    Exercises     PT Diagnosis: Difficulty walking;Generalized weakness  PT Problem List: Decreased strength;Decreased activity tolerance;Decreased knowledge of precautions;Decreased mobility;Pain PT Treatment Interventions: DME instruction;Gait training;Functional mobility training;Wheelchair mobility training;Therapeutic activities;Therapeutic exercise;Patient/family education     PT Goals(Current goals can Deshauna found in the care plan section) Acute Rehab PT Goals PT Goal  Formulation: With patient Time For Goal Achievement: 08/02/13 Potential to Achieve Goals: Good  Visit Information  Last PT Received On:  07/19/13 Assistance Needed: +2 History of Present Illness: 80 y.o. female has a past medical history significant for recent fall for spinal fracture, hospitalized here and discharge to nursing home, and discharge from a nursing home home about 2 days ago, history of hypertension, diabetes, who presents to the emergency room with a chief complaint of fever for the last day. Patient does not speak English and the family has been helping in translation. Patient endorses fever and body aches and productive cough today. She tells me that several people at [facility] had been having flulike symptoms and she was treated with Tamiflu over there. She denies any chest pain. Mild shortness of breath resolved after 2 L of nasal cannula. Denies any nausea or vomiting. Endorses severe constipation hasn't had a bowel movement in a week. Endorses decreased urine output for the past day.       Prior Functioning  Home Living Family/patient expects to Mariajose discharged to:: Private residence Living Arrangements: Children Available Help at Discharge: Family;Available PRN/intermittently Type of Home: House Home Access: Stairs to enter Entrance Stairs-Number of Steps: 1 Home Layout: One level Home Equipment: Walker - 4 wheels;Cane - single point Additional Comments: above per previous admission, female family member in room uncertain of d/c plans at this time, recently d/c home from SNF Prior Function Level of Independence: Needs assistance Comments: walks with rollator Communication Communication: Prefers language other than English    Cognition  Cognition Arousal/Alertness: Awake/alert Behavior During Therapy: WFL for tasks assessed/performed Overall Cognitive Status: Within Functional Limits for tasks assessed    Extremity/Trunk Assessment Lower Extremity Assessment Lower Extremity Assessment: Generalized weakness   Balance    End of Session PT - End of Session Equipment Utilized During Treatment:  Oxygen Activity Tolerance: Patient limited by fatigue;Patient limited by pain Patient left: in bed;with call bell/phone within reach  GP     [facility] E 07/19/2013, 12:32 PM Zenovia Jarred, PT, DPT 07/19/2013 Pager: [phone]

## 2013-07-19 NOTE — Progress Notes (Signed)
Patient has a bed at [facility] for Monday, 1/26 - will follow-up then and facilitate discharge if patient is cleared.   Clinical Social Work Department BRIEF PSYCHOSOCIAL ASSESSMENT 07/19/2013  [facility]  [facility]     Account Number:  [id number]     Admit date:  07/18/2013  Clinical Social Worker:  Orpah GreekFOLEY,Sherrita Riederer, LCSWA  Date/Time:  07/19/2013 03:34 PM  Referred by:  Physician  Date Referred:  07/19/2013 Referred for  SNF Placement   Other Referral:   Interview type:  Family Other interview type:   patient's grandaughter, [name] via phone    PSYCHOSOCIAL DATA Living Status:  FAMILY Admitted from facility:   Level of care:   Primary support name:  Luster LandsbergVyvy [name] Engineer, building services(grandaughter) c#: [phone] Primary support relationship to patient:  FAMILY Degree of support available:   good    CURRENT CONCERNS Current Concerns  Post-Acute Placement   Other Concerns:    SOCIAL WORK ASSESSMENT / PLAN CSW reviewed PT evaluation recommending SNF placement.   Assessment/plan status:  Information/Referral to [employer] Other assessment/ plan:   Information/referral to community resources:   CSW completed FL2 and faxed information to [facility], confirmed with Lowella Bandyikki that they would [place] able to take patient back as long term care.    PATIENT'S/FAMILY'S RESPONSE TO PLAN OF CARE: CSW is familiar with patient as she was on [facility] in December and discharged [facility] for short-term rehab on 12/29, per CeredoNikki at [facility] patient discharged home after 20 days of rehab on 07/14/13, patient then admitted back to the hospital on 1/22.    CSW spoke with patient's grandaughter, [name] re: discharge planning and she is agreeable to have patient go back to [facility] for long-term care. Nikki @ [facility] has submitted information through [employer] for [employer].       Lincoln MaxinKelly Dominico Rod, LCSW [facility] Clinical Social Worker cell #: [phone]

## 2013-07-20 DIAGNOSIS — J189 Pneumonia, unspecified organism: Secondary | ICD-10-CM | POA: Diagnosis not present

## 2013-07-20 DIAGNOSIS — I5032 Chronic diastolic (congestive) heart failure: Secondary | ICD-10-CM | POA: Diagnosis not present

## 2013-07-20 DIAGNOSIS — E119 Type 2 diabetes mellitus without complications: Secondary | ICD-10-CM | POA: Diagnosis not present

## 2013-07-20 DIAGNOSIS — I10 Essential (primary) hypertension: Secondary | ICD-10-CM | POA: Diagnosis not present

## 2013-07-20 LAB — MAGNESIUM: MAGNESIUM: 1.5 mg/dL (ref 1.5–2.5)

## 2013-07-20 LAB — BASIC METABOLIC PANEL
BUN: 6 mg/dL (ref 6–23)
CALCIUM: 8.7 mg/dL (ref 8.4–10.5)
CO2: 30 mEq/L (ref 19–32)
Chloride: 97 mEq/L (ref 96–112)
Creatinine, Ser: 0.59 mg/dL (ref 0.50–1.10)
GFR calc non Af Amer: 85 mL/min — ABNORMAL LOW (ref >90)
Glucose, Bld: 103 mg/dL — ABNORMAL HIGH (ref 70–99)
Potassium: 3.6 mEq/L — ABNORMAL LOW (ref 3.7–5.3)
Sodium: 137 mEq/L (ref 137–147)

## 2013-07-20 LAB — CBC
HEMATOCRIT: 31 % — AB (ref 36.0–46.0)
Hemoglobin: 9.9 g/dL — ABNORMAL LOW (ref 12.0–15.0)
MCH: 27.8 pg (ref 26.0–34.0)
MCHC: 31.9 g/dL (ref 30.0–36.0)
MCV: 87.1 fL (ref 78.0–100.0)
PLATELETS: 241 10*3/uL (ref 150–400)
RBC: 3.56 MIL/uL — ABNORMAL LOW (ref 3.87–5.11)
RDW: 13.3 % (ref 11.5–15.5)
WBC: 6.2 10*3/uL (ref 4.0–10.5)

## 2013-07-20 LAB — TROPONIN I: Troponin I: <0.3 ng/mL (ref <0.30)

## 2013-07-20 MED ORDER — LACTULOSE 10 GM/15ML PO SOLN
20.0000 g | Freq: Once | ORAL | Status: AC
  Administered 2013-07-20: 20 g via ORAL
  Filled 2013-07-20: qty 30

## 2013-07-20 MED ORDER — CAPSAICIN 0.025 % EX CREA
TOPICAL_CREAM | Freq: Two times a day (BID) | CUTANEOUS | Status: DC
  Administered 2013-07-21 (×2): via TOPICAL
  Administered 2013-07-22: 1 via TOPICAL
  Filled 2013-07-20: qty 56.6

## 2013-07-20 MED ORDER — FLUOXETINE HCL 10 MG PO CAPS
10.0000 mg | ORAL_CAPSULE | Freq: Every day | ORAL | Status: DC
  Administered 2013-07-20 – 2013-07-21 (×2): 10 mg via ORAL
  Filled 2013-07-20 (×3): qty 1

## 2013-07-20 MED ORDER — GABAPENTIN 300 MG PO CAPS
300.0000 mg | ORAL_CAPSULE | Freq: Every day | ORAL | Status: DC
  Administered 2013-07-21 (×2): 300 mg via ORAL
  Filled 2013-07-20 (×3): qty 1

## 2013-07-20 MED ORDER — BISACODYL 10 MG RE SUPP
10.0000 mg | Freq: Once | RECTAL | Status: AC
  Administered 2013-07-20: 10 mg via RECTAL
  Filled 2013-07-20: qty 1

## 2013-07-20 MED ORDER — FUROSEMIDE 40 MG PO TABS
40.0000 mg | ORAL_TABLET | Freq: Every day | ORAL | Status: DC
  Administered 2013-07-21 – 2013-07-22 (×2): 40 mg via ORAL
  Filled 2013-07-20 (×2): qty 1

## 2013-07-20 NOTE — Progress Notes (Signed)
[facility] PROGRESS NOTE  Margaret [name] ZOX:[id number] DOB: 03/02/1932 DOA: 07/18/2013 PCP: Pcp Not In System  Assessment/Plan: HCAP  -Continue cefepime pending culture data  -Influenza PCR negative  -d/c vancomycin Diabetes mellitus type 2  -Hemoglobin A1c 6.0 on 06/21/2013  -Discontinue CBG checks  Hypertension  -hold losartan due to soft BP  Chronic diastolic CHF  -Appears compensated  -06/23/2014 Echo shows EF 65-70%, grade 1 diastolic dysfunction  -Restart furosemide 07/21/13 History of CVA  -Continue aspirin  -PT evaluation-->SNF  Leg pain/edema  -Venous duplex--neg -trial of gabapentin Chest discomfort  -EKG--no acute ST to T wave changes -Troponins--neg  Abdominal pain  -2 view abdominal x-ray--negative for ileus or obstruction; shows prominent stool -Add Dulcolax suppository--continue Colace and Senokot  Hypokalemia  -Replete  -Check magnesium  Depression -start prozac Family Communication: son updated beside  Disposition Plan: SNF when medically stable  Antibiotics:  Vancomycin 07/18/2013>>> 07/20/2013 Cefepime 07/18/2013>>>         Procedures/Studies: Dg Chest 2 View  07/18/2013   CLINICAL DATA:  Shortness of breath, cough and weakness.  EXAM: CHEST  2 VIEW  COMPARISON:  Chest radiograph July 16, 2013  FINDINGS: Cardiac silhouette is unremarkable, tortuous, calcified and possibly ectatic aorta. Mild chronic interstitial changes, no pleural effusions. Slightly increasing strandy densities are right lung base. Pneumothorax.  Scoliosis. Osteopenia with chronic approximate L1 severe compression fracture.  IMPRESSION: Mild chronic interstitial changes with increasing strandy densities in right lung base suggesting atelectasis or even very early pneumonia.   Electronically Signed   By: Awilda Metro   On: 07/18/2013 19:14   Dg Chest 2 View  06/21/2013   CLINICAL DATA:  Possible abnormal soft tissue density within the upper lungs on a previous  outside CT scan.  EXAM: CHEST  2 VIEW  COMPARISON:  Rib detail study of Nov 11, 2012.  FINDINGS: The lungs are adequately inflated. There is no focal infiltrate. There are coarse lung markings in the lingula which are not entirely new. There is no pleural effusion. The cardiopericardial silhouette is enlarged. The pulmonary vascularity is mildly prominent centrally. The observed portions of the bony thorax exhibit osteopenia. There is curvature of the thoracolumbar spine in an S shaped configuration.  IMPRESSION: 1. No definite upper lobe mass or other abnormality is demonstrated. 2. The findings suggest mild pulmonary interstitial edema likely of cardiac cause. 3. Atelectasis versus scarring in the lingula is present.   Electronically Signed   By: Krupa Stege  [place]   On: 06/21/2013 21:01   Dg Lumbar Spine Complete  06/21/2013   CLINICAL DATA:  Low back pain.  Fall.  EXAM: LUMBAR SPINE - COMPLETE 4+ VIEW  COMPARISON:  CT 03/31/2012.  FINDINGS: T12 compression fracture is present with 50% loss of vertebral body height. Minimal retropulsion of the superior endplate. This appears new compared to the prior CT of 2013. Aortoiliac atherosclerosis. Osteopenia. Lumbar vertebral body height is preserved. There are 5 lumbar type vertebral bodies. Levoconvex curve of the lumbar spine.  IMPRESSION: Likely acute T12 compression fracture with 50% loss of vertebral body height and mild retropulsion. Underlying osteopenia.   Electronically Signed   By: Andreas Newport M.D.   On: 06/21/2013 16:19   Dg Pelvis 1-2 Views  06/21/2013   CLINICAL DATA:  Fall.  Right-sided pain.  EXAM: PELVIS - 1-2 VIEW  COMPARISON:  CT 03/31/2012.  FINDINGS: Old healed left obturator ring fracture is present. Dense phlebolith in the left anatomic pelvis. Osteopenia. There is no displaced acute fracture  identified on the single view of the pelvis. Left obturator ring fracture was demonstrated on prior CT.  IMPRESSION: 1. Osteopenia without an acute  osseous injury. 2. Old left obturator ring fracture, healed.   Electronically Signed   By: Andreas Newport M.D.   On: 06/21/2013 16:17   Ct Head Wo Contrast  06/21/2013   CLINICAL DATA:  Status post fall  EXAM: CT HEAD WITHOUT CONTRAST  CT CERVICAL SPINE WITHOUT CONTRAST  TECHNIQUE: Multidetector CT imaging of the head and cervical spine was performed following the standard protocol without intravenous contrast. Multiplanar CT image reconstructions of the cervical spine were also generated.  COMPARISON:  CT scan of the brain and cervical spine dated October 12, 2007.  FINDINGS: CT HEAD FINDINGS  There is mild diffuse cerebral and cerebellar atrophy with compensatory ventriculomegaly. These findings appear stable. There is an old lacunar infarction in the region of the genu of the internal capsule on the right. In the anterior aspect of the basal ganglia on the right there is evidence of an old lacunar infarction as well. Small lacunar infarctions in the periphery of the left basal ganglia are demonstrated. There is decreased density in the deep white matter of both cerebral hemispheres consistent with chronic small vessel ischemic type change. There is no shift of the midline. There is no evidence of an acute intracranial hemorrhage. There is no evidence of an evolving ischemic infarction. The cerebellum and brainstem exhibit no acute abnormalities.  At bone window settings the observed portions of the paranasal sinuses exhibit no air-fluid levels. The mastoid air cells appear somewhat hypoplastic bilaterally. There are postsurgical changes in the right mastoid bone. There is no evidence of an acute skull fracture. There is no definite cephalohematoma.  CT CERVICAL SPINE FINDINGS  The cervical vertebral bodies are preserved in height. There is disc space narrowing at C5-6 with a prominent anterior endplate osteophyte nearly bridging the disc space. There are small posterior osteophytes at C3-4 and at C4-5. The  prevertebral soft tissue spaces appear normal. There is no evidence of a perched facet nor spinous process fracture. The bony ring at each cervical level is intact. The observed portions of the 1st and 2nd ribs appear normal. There is apical pleural scarring bilaterally. There is a stable pulmonary nodule in the left upper lobe laterally just under 7 mm in greatest dimension.  IMPRESSION: 1. There is no evidence of an acute intracranial hemorrhage nor of an evolving ischemic event within the brain. There are extensive chronic changes consistent with small vessel ischemia and old lacunar infarctions. 2. There is no evidence of an acute cervical spine fracture nor dislocation. There is mild degenerative disc change at multiple levels which appears stable. 3. There is a stable soft tissue density nodule in the upper lobe of the left lung.   Electronically Signed   By: Laakea Pereira  [place]   On: 06/21/2013 16:14   Ct Cervical Spine Wo Contrast  06/21/2013   CLINICAL DATA:  Status post fall  EXAM: CT HEAD WITHOUT CONTRAST  CT CERVICAL SPINE WITHOUT CONTRAST  TECHNIQUE: Multidetector CT imaging of the head and cervical spine was performed following the standard protocol without intravenous contrast. Multiplanar CT image reconstructions of the cervical spine were also generated.  COMPARISON:  CT scan of the brain and cervical spine dated October 12, 2007.  FINDINGS: CT HEAD FINDINGS  There is mild diffuse cerebral and cerebellar atrophy with compensatory ventriculomegaly. These findings appear stable. There is an old lacunar infarction  in the region of the genu of the internal capsule on the right. In the anterior aspect of the basal ganglia on the right there is evidence of an old lacunar infarction as well. Small lacunar infarctions in the periphery of the left basal ganglia are demonstrated. There is decreased density in the deep white matter of both cerebral hemispheres consistent with chronic small vessel ischemic type  change. There is no shift of the midline. There is no evidence of an acute intracranial hemorrhage. There is no evidence of an evolving ischemic infarction. The cerebellum and brainstem exhibit no acute abnormalities.  At bone window settings the observed portions of the paranasal sinuses exhibit no air-fluid levels. The mastoid air cells appear somewhat hypoplastic bilaterally. There are postsurgical changes in the right mastoid bone. There is no evidence of an acute skull fracture. There is no definite cephalohematoma.  CT CERVICAL SPINE FINDINGS  The cervical vertebral bodies are preserved in height. There is disc space narrowing at C5-6 with a prominent anterior endplate osteophyte nearly bridging the disc space. There are small posterior osteophytes at C3-4 and at C4-5. The prevertebral soft tissue spaces appear normal. There is no evidence of a perched facet nor spinous process fracture. The bony ring at each cervical level is intact. The observed portions of the 1st and 2nd ribs appear normal. There is apical pleural scarring bilaterally. There is a stable pulmonary nodule in the left upper lobe laterally just under 7 mm in greatest dimension.  IMPRESSION: 1. There is no evidence of an acute intracranial hemorrhage nor of an evolving ischemic event within the brain. There are extensive chronic changes consistent with small vessel ischemia and old lacunar infarctions. 2. There is no evidence of an acute cervical spine fracture nor dislocation. There is mild degenerative disc change at multiple levels which appears stable. 3. There is a stable soft tissue density nodule in the upper lobe of the left lung.   Electronically Signed   By: Sharnee Douglass  [place]   On: 06/21/2013 16:14   Mr Brain Wo Contrast  06/21/2013   CLINICAL DATA:  Syncope, fall with headache and back pain, nausea. Prior mastoidectomy.  EXAM: MRI HEAD WITHOUT CONTRAST  TECHNIQUE: Multiplanar, multiecho pulse sequences of the brain and surrounding  structures were obtained without intravenous contrast.  COMPARISON:  CT of the head June 21, 2013 at 1554 hr.  FINDINGS: No reduced diffusion to suggest acute ischemia.  Moderate ventriculomegaly, with mild disproportionate sulcal effacement at the convexities. Bilateral basal ganglia fluid signal lacunar infarcts, addition to subcentimeter right thalamic remote lacunar infarcts with minimal surrounding gliosis. Prominent right inferior occipital sulcus suggests underlying parenchymal volume loss. Confluent supratentorial white matter FLAIR T2 hyperintensities without midline shift nor mass effect. Remote small left cerebellar infarct associated with microhemorrhage. Bilateral thalami punctate foci of susceptibility artifact, in addition to a few scattered foci of susceptibility artifact within the deep white matter. No intrinsic T1 shortening to suggest subacute blood products.  No abnormal extra-axial fluid collections. Normal major intracranial vascular flow voids observed at the skull base.  Status post right wall down mastoidectomy, with fluid signal within the left middle ear and mastoid air cells, and right middle ear. No suspicious calvarial bone marrow signal, generalized bright T1 bone marrow signal favors osteopenia. Fluid filled non expanded sella. Craniocervical junction maintained. Patient is edentulous. Status post bilateral ocular lens implants.  IMPRESSION: No acute intracranial process, specifically no evidence of acute ischemia.  Moderate to severe white matter changes suggest chronic  small vessel ischemic disease with remote bilateral basal ganglia, right thalamic lacunar infarcts, and small remote left cerebellar hemorrhagic lacunar infarct. Scattered micro hemorrhages in the supratentorial brain favors sequelae of chronic hypertension.  Mild sulcal effacement at this convexities which can Paulett associated with normal pressure hydrocephalus.  Status post right wall down mastoidectomy with fluid  within the bilateral middle ears and left mastoid air cells, which may reflect eustachian tube dysfunction.   Electronically Signed   By: Awilda Metro   On: 06/21/2013 22:06   Dg Abd 2 Views  07/19/2013   CLINICAL DATA:  Abdominal pain.  EXAM: ABDOMEN - 2 VIEW  COMPARISON:  Radiographs dated 06/21/2013  FINDINGS: There is fairly extensive air scattered throughout loops of large and small bowel with a moderate amount of stool in the left side of the colon without of fecal impaction. No free air or air-fluid levels.  There is a moderately severe compression fracture of T12 which is new since 11/11/2012 lobe was present on 06/21/2013  IMPRESSION: Compression fracture of T12, age indeterminate. Normal bowel gas pattern.   Electronically Signed   By: Geanie Cooley M.D.   On: 07/19/2013 14:54         Subjective: Patient states that she is breathing better. She still has a cough without hemoptysis. She denies fevers, chills, chest pain, vomiting, diarrhea, abdominal pain, dysuria  Objective: Filed Vitals:   07/19/13 1446 07/19/13 2200 07/20/13 0456 07/20/13 1330  BP: 127/77 123/65 118/70 118/90  Pulse: 94 87 80 89  Temp: 98.4 F (36.9 C) 98.8 F (37.1 C) 98.5 F (36.9 C) 98.6 F (37 C)  TempSrc: Oral Oral Oral Oral  Resp: 16 16 15 18   Height:      Weight:   46.675 kg (102 lb 14.4 oz)   SpO2: 100% 100% 100% 99%    Intake/Output Summary (Last 24 hours) at 07/20/13 1855 Last data filed at 07/20/13 1700  Gross per 24 hour  Intake    830 ml  Output      0 ml  Net    830 ml   Weight change: -3.221 kg (-7 lb 1.6 oz) Exam:   General:  Pt is alert, follows commands appropriately, not in acute distress  HEENT: No icterus, No thrush, No neck mass, Brady/AT  Cardiovascular: RRR, S1/S2, no rubs, no gallops  Respiratory: Bibasilar rales. No wheezing. Good air movement.  Abdomen: Soft/+BS, non tender, non distended, no guarding  Extremities: No edema, No lymphangitis, No petechiae, No  rashes, no synovitis  Data Reviewed: Basic Metabolic Panel:  Recent Labs Lab 07/18/13 1910 07/19/13 0456 07/20/13 0138  NA 139 139 137  K 3.3* 3.4* 3.6*  CL 95* 99 97  CO2 32 30 30  GLUCOSE 159* 81 103*  BUN 12 9 6   CREATININE 0.76 0.69 0.59  CALCIUM 8.9 8.1* 8.7  MG  --   --  1.5   Liver Function Tests:  Recent Labs Lab 07/18/13 1910  AST 13  ALT 9  ALKPHOS 84  BILITOT 0.8  PROT 7.4  ALBUMIN 3.1*   No results found for this basename: LIPASE, AMYLASE,  in the last 168 hours No results found for this basename: AMMONIA,  in the last 168 hours CBC:  Recent Labs Lab 07/18/13 1910 07/19/13 0456 07/20/13 0138  WBC 10.6* 7.2 6.2  NEUTROABS 8.6*  --   --   HGB 11.3* 9.8* 9.9*  HCT 34.0* 30.5* 31.0*  MCV 86.3 87.9 87.1  PLT 295  222 241   Cardiac Enzymes:  Recent Labs Lab 07/19/13 1525 07/19/13 1918 07/20/13 0138  TROPONINI <0.30 <0.30 <0.30   BNP: No components found with this basename: POCBNP,  CBG:  Recent Labs Lab 07/18/13 2245 07/19/13 0729 07/19/13 1215  GLUCAP 176* 92 121*    Recent Results (from the past 240 hour(s))  CULTURE, BLOOD (ROUTINE X 2)     Status: None   Collection Time    07/18/13  7:10 PM      Result Value Range Status   Specimen Description BLOOD RIGHT ARM   Final   Special Requests BOTTLES DRAWN AEROBIC AND ANAEROBIC 5ML EACH   Final   Culture  Setup Time     Final   Value: 07/18/2013 22:52     Performed at [facility]   Culture     Final   Value:        BLOOD CULTURE RECEIVED NO GROWTH TO DATE CULTURE WILL Hava HELD FOR 5 DAYS BEFORE ISSUING A FINAL NEGATIVE REPORT     Performed at [facility]   Report Status PENDING   Incomplete  CULTURE, BLOOD (ROUTINE X 2)     Status: None   Collection Time    07/18/13  7:41 PM      Result Value Range Status   Specimen Description BLOOD LEFT ARM   Final   Special Requests BOTTLES DRAWN AEROBIC AND ANAEROBIC 4CC   Final   Culture  Setup Time     Final   Value:  07/18/2013 22:51     Performed at [facility]   Culture     Final   Value:        BLOOD CULTURE RECEIVED NO GROWTH TO DATE CULTURE WILL [place]en HELD FOR 5 DAYS BEFORE ISSUING A FINAL NEGATIVE REPORT     Performed at [facility]   Report Status PENDING   Incomplete     Scheduled Meds: . aspirin  81 mg Oral Daily  . bisacodyl  10 mg Rectal Once  . ceFEPime (MAXIPIME) IV  1 g Intravenous Q12H  . enoxaparin (LOVENOX) injection  40 mg Subcutaneous Q24H  . FLUoxetine  10 mg Oral QHS  . lactulose  20 g Oral Once  . senna-docusate  1 tablet Oral BID   Continuous Infusions:    Taiwo Fish, DO  [facility] Pager [phone]  If 7PM-7AM, please contact night-coverage [email or web address] Password [email or web address] 07/20/2013, 6:55 PM   LOS: 2 days

## 2013-07-21 DIAGNOSIS — E119 Type 2 diabetes mellitus without complications: Secondary | ICD-10-CM | POA: Diagnosis not present

## 2013-07-21 DIAGNOSIS — I5032 Chronic diastolic (congestive) heart failure: Secondary | ICD-10-CM | POA: Diagnosis not present

## 2013-07-21 DIAGNOSIS — R5381 Other malaise: Secondary | ICD-10-CM

## 2013-07-21 DIAGNOSIS — J189 Pneumonia, unspecified organism: Secondary | ICD-10-CM | POA: Diagnosis not present

## 2013-07-21 DIAGNOSIS — I10 Essential (primary) hypertension: Secondary | ICD-10-CM | POA: Diagnosis not present

## 2013-07-21 LAB — EXPECTORATED SPUTUM ASSESSMENT W REFEX TO RESP CULTURE

## 2013-07-21 LAB — EXPECTORATED SPUTUM ASSESSMENT W GRAM STAIN, RFLX TO RESP C

## 2013-07-21 NOTE — Progress Notes (Signed)
Received phone call from grandaughter who states patient would Detta at home alone most of day due to family members working schedules if she goes home instead of to nursing facility  and that pt desires to return to nursing home because of this.  Will make Dr Tat aware and give him grandaughter's  phone number.

## 2013-07-21 NOTE — Progress Notes (Signed)
Telemetry was d'cd as ordered.

## 2013-07-21 NOTE — Progress Notes (Signed)
[facility] PROGRESS NOTE  Margaret [name] AVW:[id number] DOB: 01-25-1932 DOA: 07/18/2013 PCP: Pcp Not In System  Assessment/Plan: HCAP  -Continue cefepime D#3 -Influenza PCR negative  -d/c vancomycin  Diabetes mellitus type 2  -Hemoglobin A1c 6.0 on 06/21/2013  -Discontinue CBG checks  Hypertension  -hold losartan due to soft BP  Chronic diastolic CHF  -Appears compensated  -06/23/2014 Echo shows EF 65-70%, grade 1 diastolic dysfunction  -Restart furosemide 07/21/13  History of CVA  -Continue aspirin  -PT evaluation-->SNF  Leg pain/edema  -Venous duplex--neg  -continue trial of gabapentin--leg pain slowly improving Chest discomfort  -EKG--no acute ST to T wave changes  -Troponins--neg  Abdominal pain  -2 view abdominal x-ray--negative for ileus or obstruction; shows prominent stool  -Add Dulcolax suppository--continue Colace and Senokot  Hypokalemia  -Replete  -Check magnesium  Depression  -start prozac  Family Communication: son and daughter updated beside--total time spent 35 minutes  Disposition Plan: home with home health After long discussion with family, it was mutually agreed that patient will do better psychosocially at home due to cultural and language differences.  Will try to set up home health RN, aide, PT  Antibiotics:  Vancomycin 07/18/2013>>> 07/20/2013  Cefepime 07/18/2013>>>          Procedures/Studies: Dg Chest 2 View  07/18/2013   CLINICAL DATA:  Shortness of breath, cough and weakness.  EXAM: CHEST  2 VIEW  COMPARISON:  Chest radiograph July 16, 2013  FINDINGS: Cardiac silhouette is unremarkable, tortuous, calcified and possibly ectatic aorta. Mild chronic interstitial changes, no pleural effusions. Slightly increasing strandy densities are right lung base. Pneumothorax.  Scoliosis. Osteopenia with chronic approximate L1 severe compression fracture.  IMPRESSION: Mild chronic interstitial changes with increasing strandy densities in right  lung base suggesting atelectasis or even very early pneumonia.   Electronically Signed   By: Awilda Metroourtnay  Bloomer   On: 07/18/2013 19:14   Dg Chest 2 View  06/21/2013   CLINICAL DATA:  Possible abnormal soft tissue density within the upper lungs on a previous outside CT scan.  EXAM: CHEST  2 VIEW  COMPARISON:  Rib detail study of Nov 11, 2012.  FINDINGS: The lungs are adequately inflated. There is no focal infiltrate. There are coarse lung markings in the lingula which are not entirely new. There is no pleural effusion. The cardiopericardial silhouette is enlarged. The pulmonary vascularity is mildly prominent centrally. The observed portions of the bony thorax exhibit osteopenia. There is curvature of the thoracolumbar spine in an S shaped configuration.  IMPRESSION: 1. No definite upper lobe mass or other abnormality is demonstrated. 2. The findings suggest mild pulmonary interstitial edema likely of cardiac cause. 3. Atelectasis versus scarring in the lingula is present.   Electronically Signed   By: Anona Giovannini  [place]   On: 06/21/2013 21:01   Mr Brain Wo Contrast  06/21/2013   CLINICAL DATA:  Syncope, fall with headache and back pain, nausea. Prior mastoidectomy.  EXAM: MRI HEAD WITHOUT CONTRAST  TECHNIQUE: Multiplanar, multiecho pulse sequences of the brain and surrounding structures were obtained without intravenous contrast.  COMPARISON:  CT of the head June 21, 2013 at 1554 hr.  FINDINGS: No reduced diffusion to suggest acute ischemia.  Moderate ventriculomegaly, with mild disproportionate sulcal effacement at the convexities. Bilateral basal ganglia fluid signal lacunar infarcts, addition to subcentimeter right thalamic remote lacunar infarcts with minimal surrounding gliosis. Prominent right inferior occipital sulcus suggests underlying parenchymal volume loss. Confluent supratentorial white matter FLAIR T2 hyperintensities without midline shift nor mass  effect. Remote small left cerebellar infarct  associated with microhemorrhage. Bilateral thalami punctate foci of susceptibility artifact, in addition to a few scattered foci of susceptibility artifact within the deep white matter. No intrinsic T1 shortening to suggest subacute blood products.  No abnormal extra-axial fluid collections. Normal major intracranial vascular flow voids observed at the skull base.  Status post right wall down mastoidectomy, with fluid signal within the left middle ear and mastoid air cells, and right middle ear. No suspicious calvarial bone marrow signal, generalized bright T1 bone marrow signal favors osteopenia. Fluid filled non expanded sella. Craniocervical junction maintained. Patient is edentulous. Status post bilateral ocular lens implants.  IMPRESSION: No acute intracranial process, specifically no evidence of acute ischemia.  Moderate to severe white matter changes suggest chronic small vessel ischemic disease with remote bilateral basal ganglia, right thalamic lacunar infarcts, and small remote left cerebellar hemorrhagic lacunar infarct. Scattered micro hemorrhages in the supratentorial brain favors sequelae of chronic hypertension.  Mild sulcal effacement at this convexities which can Markeisha associated with normal pressure hydrocephalus.  Status post right wall down mastoidectomy with fluid within the bilateral middle ears and left mastoid air cells, which may reflect eustachian tube dysfunction.   Electronically Signed   By: Awilda Metro   On: 06/21/2013 22:06   Dg Abd 2 Views  07/19/2013   CLINICAL DATA:  Abdominal pain.  EXAM: ABDOMEN - 2 VIEW  COMPARISON:  Radiographs dated 06/21/2013  FINDINGS: There is fairly extensive air scattered throughout loops of large and small bowel with a moderate amount of stool in the left side of the colon without of fecal impaction. No free air or air-fluid levels.  There is a moderately severe compression fracture of T12 which is new since 11/11/2012 lobe was present on 06/21/2013   IMPRESSION: Compression fracture of T12, age indeterminate. Normal bowel gas pattern.   Electronically Signed   By: Geanie Cooley M.D.   On: 07/19/2013 14:54         Subjective: Patient is doing better today. She had a bowel movement yesterday. Denies fevers, chest pain, shortness of breath, vomiting, diarrhea, abdominal pain. She still has a nonproductive cough. No hemoptysis.  Objective: Filed Vitals:   07/20/13 2048 07/21/13 0404 07/21/13 0408 07/21/13 1400  BP: 108/61 102/58  125/64  Pulse: 98 77  103  Temp: 99 F (37.2 C) 98.7 F (37.1 C)  97.8 F (36.6 C)  TempSrc: Oral Oral  Oral  Resp: 18 15  16   Height:      Weight:   46.4 kg (102 lb 4.7 oz)   SpO2: 99% 97%  97%    Intake/Output Summary (Last 24 hours) at 07/21/13 1659 Last data filed at 07/21/13 1400  Gross per 24 hour  Intake    220 ml  Output      0 ml  Net    220 ml   Weight change: -0.275 kg (-9.7 oz) Exam:   General:  Pt is alert, follows commands appropriately, not in acute distress  HEENT: No icterus, No thrush,  [place]/AT  Cardiovascular: RRR, S1/S2, no rubs, no gallops  Respiratory: Right-sided crackles and diminished breath sounds. Left clear to auscultation. No wheezing. Good air movement.  Abdomen: Soft/+BS, non tender, non distended, no guarding  Extremities: trace Babiarz edema, No lymphangitis, No petechiae, No rashes, no synovitis  Data Reviewed: Basic Metabolic Panel:  Recent Labs Lab 07/18/13 1910 07/19/13 0456 07/20/13 0138  NA 139 139 137  K 3.3* 3.4* 3.6*  CL 95* 99 97  CO2 32 30 30  GLUCOSE 159* 81 103*  BUN 12 9 6   CREATININE 0.76 0.69 0.59  CALCIUM 8.9 8.1* 8.7  MG  --   --  1.5   Liver Function Tests:  Recent Labs Lab 07/18/13 1910  AST 13  ALT 9  ALKPHOS 84  BILITOT 0.8  PROT 7.4  ALBUMIN 3.1*   No results found for this basename: LIPASE, AMYLASE,  in the last 168 hours No results found for this basename: AMMONIA,  in the last 168 hours CBC:  Recent  Labs Lab 07/18/13 1910 07/19/13 0456 07/20/13 0138  WBC 10.6* 7.2 6.2  NEUTROABS 8.6*  --   --   HGB 11.3* 9.8* 9.9*  HCT 34.0* 30.5* 31.0*  MCV 86.3 87.9 87.1  PLT 295 222 241   Cardiac Enzymes:  Recent Labs Lab 07/19/13 1525 07/19/13 1918 07/20/13 0138  TROPONINI <0.30 <0.30 <0.30   BNP: No components found with this basename: POCBNP,  CBG:  Recent Labs Lab 07/18/13 2245 07/19/13 0729 07/19/13 1215  GLUCAP 176* 92 121*    Recent Results (from the past 240 hour(s))  CULTURE, BLOOD (ROUTINE X 2)     Status: None   Collection Time    07/18/13  7:10 PM      Result Value Range Status   Specimen Description BLOOD RIGHT ARM   Final   Special Requests BOTTLES DRAWN AEROBIC AND ANAEROBIC EACH   Final   Culture  Setup Time     Final   Value: 07/18/2013 22:52     Performed at [facility]   Culture     Final   Value:        BLOOD CULTURE RECEIVED NO GROWTH TO DATE CULTURE WILL Navi HELD FOR 5 DAYS BEFORE ISSUING A FINAL NEGATIVE REPORT     Performed at [facility]   Report Status PENDING   Incomplete  CULTURE, BLOOD (ROUTINE X 2)     Status: None   Collection Time    07/18/13  7:41 PM      Result Value Range Status   Specimen Description BLOOD LEFT ARM   Final   Special Requests BOTTLES DRAWN AEROBIC AND ANAEROBIC 4CC   Final   Culture  Setup Time     Final   Value: 07/18/2013 22:51     Performed at [facility]   Culture     Final   Value:        BLOOD CULTURE RECEIVED NO GROWTH TO DATE CULTURE WILL Athina HELD FOR 5 DAYS BEFORE ISSUING A FINAL NEGATIVE REPORT     Performed at [facility]   Report Status PENDING   Incomplete  CULTURE, EXPECTORATED SPUTUM-ASSESSMENT     Status: None   Collection Time    07/21/13  4:13 PM      Result Value Range Status   Specimen Description SPUTUM   Final   Special Requests NONE   Final   Sputum evaluation     Final   Value: THIS SPECIMEN IS ACCEPTABLE. RESPIRATORY CULTURE REPORT TO FOLLOW.    Report Status 07/21/2013 FINAL   Final     Scheduled Meds: . aspirin  81 mg Oral Daily  . capsaicin   Topical BID  . ceFEPime (MAXIPIME) IV  1 g Intravenous Q12H  . enoxaparin (LOVENOX) injection  40 mg Subcutaneous Q24H  . FLUoxetine  10 mg Oral QHS  . furosemide  40 mg  Oral Daily  . gabapentin  300 mg Oral QHS  . senna-docusate  1 tablet Oral BID   Continuous Infusions:    Yahshua Thibault, DO  [facility] Pager [phone]  If 7PM-7AM, please contact night-coverage [email or web address] Password [email or web address] 07/21/2013, 4:59 PM   LOS: 3 days

## 2013-07-21 NOTE — Progress Notes (Signed)
Pt's granddaughter was left a message to call 4th floor and that her grand mother was moved to the [address].  Will try again later.

## 2013-07-21 NOTE — Progress Notes (Signed)
ANTIBIOTIC CONSULT NOTE - INITIAL  Pharmacy Consult for Cefepime Indication: HCAP  No Known Allergies  Patient Measurements: Height: 4\' 9"  (144.8 cm) Weight: 102 lb 4.7 oz (46.4 kg) IBW/kg (Calculated) : 38.6  Vital Signs: Temp: 98.7 F (37.1 C) (01/25 0404) Temp src: Oral (01/25 0404) BP: 102/58 mmHg (01/25 0404) Pulse Rate: 77 (01/25 0404) Intake/Output from previous day: 01/24 0701 - 01/25 0700 In: 530 [P.O.:480; IV Piggyback:50] Out: -  Intake/Output from this shift:    Labs:  Recent Labs  07/18/13 1910 07/19/13 0456 07/20/13 0138  WBC 10.6* 7.2 6.2  HGB 11.3* 9.8* 9.9*  PLT 295 222 241  CREATININE 0.76 0.69 0.59   Estimated Creatinine Clearance: 37.5 ml/min (by C-G formula based on Cr of 0.59). No results found for this basename: Rolm Gala, VANCORANDOM, GENTTROUGH, GENTPEAK, GENTRANDOM, TOBRATROUGH, TOBRAPEAK, TOBRARND, AMIKACINPEAK, AMIKACINTROU, AMIKACIN,  in the last 72 hours   Microbiology: Recent Results (from the past 720 hour(s))  URINE CULTURE     Status: None   Collection Time    06/21/13  4:29 PM      Result Value Range Status   Specimen Description URINE, CLEAN CATCH   Final   Special Requests NONE   Final   Culture  Setup Time     Final   Value: 06/22/2013 00:19     Performed at [facility] Count     Final   Value: 75,000 COLONIES/ML     Performed at [facility]   Culture     Final   Value: Multiple bacterial morphotypes present, none predominant. Suggest appropriate recollection if clinically indicated.     Performed at [facility]   Report Status 06/23/2013 FINAL   Final  CULTURE, BLOOD (ROUTINE X 2)     Status: None   Collection Time    07/18/13  7:10 PM      Result Value Range Status   Specimen Description BLOOD RIGHT ARM   Final   Special Requests BOTTLES DRAWN AEROBIC AND ANAEROBIC EACH   Final   Culture  Setup Time     Final   Value: 07/18/2013 22:52     Performed at [facility]   Culture     Final   Value:        BLOOD CULTURE RECEIVED NO GROWTH TO DATE CULTURE WILL Nolene HELD FOR 5 DAYS BEFORE ISSUING A FINAL NEGATIVE REPORT     Performed at [facility]   Report Status PENDING   Incomplete  CULTURE, BLOOD (ROUTINE X 2)     Status: None   Collection Time    07/18/13  7:41 PM      Result Value Range Status   Specimen Description BLOOD LEFT ARM   Final   Special Requests BOTTLES DRAWN AEROBIC AND ANAEROBIC 4CC   Final   Culture  Setup Time     Final   Value: 07/18/2013 22:51     Performed at [facility]   Culture     Final   Value:        BLOOD CULTURE RECEIVED NO GROWTH TO DATE CULTURE WILL Shaquan HELD FOR 5 DAYS BEFORE ISSUING A FINAL NEGATIVE REPORT     Performed at [facility]   Report Status PENDING   Incomplete    Medications:  Scheduled:  . aspirin  81 mg Oral Daily  . capsaicin   Topical BID  . ceFEPime (MAXIPIME) IV  1  g Intravenous Q12H  . enoxaparin (LOVENOX) injection  40 mg Subcutaneous Q24H  . FLUoxetine  10 mg Oral QHS  . furosemide  40 mg Oral Daily  . gabapentin  300 mg Oral QHS  . senna-docusate  1 tablet Oral BID   Anti-infectives: 1/22 >> Zosyn x 1 1/22 >> vancomycin >> 1/24 1/22 >> cefepime >> 1/22 >> Tamiflu >> 1/23  Assessment: 80 yo female presented to ER with fever, flu-like symptoms and [facility]   Day #4/8 Cefepime  Afeb since admit  Renal function stable, CrCl 37 ml/min  WBC improved to wnl  Cultures unrevealing  Goal of Therapy:  Eradication of infection Dose per renal function  Plan:   Continue Cefepime 1g IV q12h Follow up renal function & cultures Follow up transition to PO  Loralee PacasErin Haile Toppins, PharmD, BCPS Pager: [phone] 07/21/2013 9:49 AM

## 2013-07-21 NOTE — Discharge Summary (Addendum)
Physician Discharge Summary  Misbah Laterica Matarazzo ZOX:[id number] DOB: 1932-05-01 DOA: 07/18/2013  PCP: Pcp Not In System  Admit date: 07/18/2013 Discharge date: 07/22/13 Recommendations for Outpatient Follow-up:  1. Pt will need to follow up with PCP in 2 weeks post discharge 2. Please obtain BMP to evaluate electrolytes and kidney function 3. Please also check CBC to evaluate Hg and Hct levels 4. Please give next levofloxacin 750mg  dose on 07/24/13 AM and final dose on 07/26/13   Discharge Diagnoses:  Principal Problem:   HCAP (healthcare-associated pneumonia) Active Problems:   Leukocytosis   Hypertension   Diabetes mellitus due to underlying condition   Chronic diastolic heart failure   Diabetes mellitus, controlled   Chronic diastolic CHF (congestive heart failure)   Physical deconditioning HCAP  -Continue cefepime D#3  -Influenza PCR negative  -d/c vancomycin  -The patient will Alexiah discharged with 2 additional doses of levofloxacin to Jeannetta given on 07/24/13 and on 07/26/13 which will complete 8 days of therapy Diabetes mellitus type 2  -Hemoglobin A1c 6.0 on 06/21/2013  -Discontinue CBG checks  Hypertension  -hold losartan due to soft BP  Chronic diastolic CHF  -Appears compensated  -06/23/2014 Echo shows EF 65-70%, grade 1 diastolic dysfunction  -Restart furosemide 07/21/13  History of CVA  -Continue aspirin  -PT evaluation-->SNF  -After going back and forth between taking the patient home and going to a skilled nursing facility, the patient's family ultimately decided to allow the patient to a skilled nursing facility for physical therapy rehabilitation. Leg pain/edema  -Venous duplex--neg  -continue trial of gabapentin--leg pain slowly improving  Chest discomfort  -EKG--no acute ST to T wave changes  -Troponins--neg  Abdominal pain  -2 view abdominal x-ray--negative for ileus or obstruction; shows prominent stool  -Add Dulcolax suppository--continue Colace and Senokot   Hypokalemia  -Repleted  -Check magnesium  Depression  -started prozac during the hospitalization Hypertension -Blood pressure has been labile but stable -Restart losartan 25 mg daily  Discharge Condition: Stable  Disposition:  skilled nursing facility  Diet: Heart healthy Wt Readings from Last 3 Encounters:  07/22/13 44.861 kg (98 lb 14.4 oz)  06/21/13 51.4 kg (113 lb 5.1 oz)  04/23/13 51.71 kg (114 lb)    History of present illness:  80 y.o. female has a past medical history significant for recent fall with T12 fracture, hospitalized here and discharge to nursing home ([facility]), and discharge from a nursing home to home about 2 days prior to admission. She has a history of hypertension, diabetes, who presents to the emergency room with a chief complaint of fever for 24 hours duration . Patient does not speak English and the family has been helping in translation. Patient endorses fever and body aches and productive cough today. She tells me that several people at [facility] had been having flulike symptoms and she was treated with Tamiflu over there. She denies any chest pain. Mild shortness of breath resolved after 2 L of nasal cannula. Denies any nausea or vomiting. Endorses severe constipation hasn't had a bowel movement in a week. The patient was started on intravenous antibiotics and IV fluids. Her furosemide was initially held. The patient improved clinically. Her WBC count improved. The patient was seen by physical therapy. They recommended skilled nursing facility. The family struggled with taking the patient home versus placing her in a nursing facility due to the psychosocial aspects and cultural differences. The ultimately decided to allow the patient to go to a skilled nursing facility.  Discharge Exam: Filed Vitals:   07/22/13 0604  BP: 123/62  Pulse: 82  Temp: 98.7 F (37.1 C)  Resp: 20   Filed Vitals:   07/21/13 1400 07/21/13 2117 07/22/13 0604 07/22/13 0737   BP: 125/64 145/75 123/62   Pulse: 103 93 82   Temp: 97.8 F (36.6 C) 98.3 F (36.8 C) 98.7 F (37.1 C)   TempSrc: Oral Oral Oral   Resp: 16 20 20    Height:      Weight:    44.861 kg (98 lb 14.4 oz)  SpO2: 97% 100% 97%    General: Awale amd alert, NAD, pleasant, cooperative Cardiovascular: RRR, no rub, no gallop, no S3 Respiratory: R-CTA. Left with auscultation. No wheezing. Good air movement. Abdomen:soft, nontender, nondistended, positive bowel sounds Extremities: trace Brigandi edema, No lymphangitis, no petechiae  Discharge Instructions      Discharge Orders   Future Orders Complete By Expires   Diet - low sodium heart healthy  As directed    Increase activity slowly  As directed        Medication List    STOP taking these medications       oseltamivir 12 MG/ML suspension  Commonly known as:  TAMIFLU      TAKE these medications       aspirin 81 MG chewable tablet  Chew 1 tablet (81 mg total) by mouth daily.     capsaicin 0.025 % cream  Commonly known as:  ZOSTRIX  Apply topically 2 (two) times daily. Apply to legs/calves and ankles bid     FLUoxetine 10 MG capsule  Commonly known as:  PROZAC  Take 1 capsule (10 mg total) by mouth at bedtime.     furosemide 40 MG tablet  Commonly known as:  LASIX  Take 40 mg by mouth every morning.     gabapentin 300 MG capsule  Commonly known as:  NEURONTIN  Take 1 capsule (300 mg total) by mouth at bedtime.     guaiFENesin 600 MG 12 hr tablet  Commonly known as:  MUCINEX  Take 600 mg by mouth 2 (two) times daily as needed for to loosen phlegm.     HYDROcodone-homatropine 5-1.5 MG/5ML syrup  Commonly known as:  HYCODAN  Take 5 mLs by mouth every 6 (six) hours as needed for cough.     levofloxacin 750 MG tablet  Commonly known as:  LEVAQUIN  Take 1 tablet (750 mg total) by mouth daily.     losartan 25 MG tablet  Commonly known as:  COZAAR  Take 25 mg by mouth every morning.     metFORMIN 500 MG 24 hr tablet   Commonly known as:  GLUCOPHAGE-XR  Take 500 mg by mouth every evening.     senna-docusate 8.6-50 MG per tablet  Commonly known as:  Senokot-S  Take 2 tablets by mouth 2 (two) times daily.     traMADol-acetaminophen 37.5-325 MG per tablet  Commonly known as:  ULTRACET  Take one tablet by mouth every four hours as needed for pain; Take two tablets by mouth every four hours as needed for severe pain         The results of significant diagnostics from this hospitalization (including imaging, microbiology, ancillary and laboratory) are listed below for reference.    Significant Diagnostic Studies: Dg Chest 2 View  07/18/2013   CLINICAL DATA:  Shortness of breath, cough and weakness.  EXAM: CHEST  2 VIEW  COMPARISON:  Chest radiograph July 16, 2013  FINDINGS: Cardiac silhouette is unremarkable, tortuous, calcified and possibly ectatic aorta. Mild chronic interstitial changes, no pleural effusions. Slightly increasing strandy densities are right lung base. Pneumothorax.  Scoliosis. Osteopenia with chronic approximate L1 severe compression fracture.  IMPRESSION: Mild chronic interstitial changes with increasing strandy densities in right lung base suggesting atelectasis or even very early pneumonia.   Electronically Signed   By: Awilda Metro   On: 07/18/2013 19:14   Dg Chest 2 View  06/21/2013   CLINICAL DATA:  Possible abnormal soft tissue density within the upper lungs on a previous outside CT scan.  EXAM: CHEST  2 VIEW  COMPARISON:  Rib detail study of Nov 11, 2012.  FINDINGS: The lungs are adequately inflated. There is no focal infiltrate. There are coarse lung markings in the lingula which are not entirely new. There is no pleural effusion. The cardiopericardial silhouette is enlarged. The pulmonary vascularity is mildly prominent centrally. The observed portions of the bony thorax exhibit osteopenia. There is curvature of the thoracolumbar spine in an S shaped configuration.   IMPRESSION: 1. No definite upper lobe mass or other abnormality is demonstrated. 2. The findings suggest mild pulmonary interstitial edema likely of cardiac cause. 3. Atelectasis versus scarring in the lingula is present.   Electronically Signed   By: Jalayne Ganesh  [place]   On: 06/21/2013 21:01   Mr Brain Wo Contrast  06/21/2013   CLINICAL DATA:  Syncope, fall with headache and back pain, nausea. Prior mastoidectomy.  EXAM: MRI HEAD WITHOUT CONTRAST  TECHNIQUE: Multiplanar, multiecho pulse sequences of the brain and surrounding structures were obtained without intravenous contrast.  COMPARISON:  CT of the head June 21, 2013 at 1554 hr.  FINDINGS: No reduced diffusion to suggest acute ischemia.  Moderate ventriculomegaly, with mild disproportionate sulcal effacement at the convexities. Bilateral basal ganglia fluid signal lacunar infarcts, addition to subcentimeter right thalamic remote lacunar infarcts with minimal surrounding gliosis. Prominent right inferior occipital sulcus suggests underlying parenchymal volume loss. Confluent supratentorial white matter FLAIR T2 hyperintensities without midline shift nor mass effect. Remote small left cerebellar infarct associated with microhemorrhage. Bilateral thalami punctate foci of susceptibility artifact, in addition to a few scattered foci of susceptibility artifact within the deep white matter. No intrinsic T1 shortening to suggest subacute blood products.  No abnormal extra-axial fluid collections. Normal major intracranial vascular flow voids observed at the skull base.  Status post right wall down mastoidectomy, with fluid signal within the left middle ear and mastoid air cells, and right middle ear. No suspicious calvarial bone marrow signal, generalized bright T1 bone marrow signal favors osteopenia. Fluid filled non expanded sella. Craniocervical junction maintained. Patient is edentulous. Status post bilateral ocular lens implants.  IMPRESSION: No acute  intracranial process, specifically no evidence of acute ischemia.  Moderate to severe white matter changes suggest chronic small vessel ischemic disease with remote bilateral basal ganglia, right thalamic lacunar infarcts, and small remote left cerebellar hemorrhagic lacunar infarct. Scattered micro hemorrhages in the supratentorial brain favors sequelae of chronic hypertension.  Mild sulcal effacement at this convexities which can Shyonna associated with normal pressure hydrocephalus.  Status post right wall down mastoidectomy with fluid within the bilateral middle ears and left mastoid air cells, which may reflect eustachian tube dysfunction.   Electronically Signed   By: Awilda Metro   On: 06/21/2013 22:06   Dg Abd 2 Views  07/19/2013   CLINICAL DATA:  Abdominal pain.  EXAM: ABDOMEN - 2 VIEW  COMPARISON:  Radiographs dated 06/21/2013  FINDINGS:  There is fairly extensive air scattered throughout loops of large and small bowel with a moderate amount of stool in the left side of the colon without of fecal impaction. No free air or air-fluid levels.  There is a moderately severe compression fracture of T12 which is new since 11/11/2012 lobe was present on 06/21/2013  IMPRESSION: Compression fracture of T12, age indeterminate. Normal bowel gas pattern.   Electronically Signed   By: Geanie CooleyJim  Maxwell M.D.   On: 07/19/2013 14:54     Microbiology: Recent Results (from the past 240 hour(s))  CULTURE, BLOOD (ROUTINE X 2)     Status: None   Collection Time    07/18/13  7:10 PM      Result Value Range Status   Specimen Description BLOOD RIGHT ARM   Final   Special Requests BOTTLES DRAWN AEROBIC AND ANAEROBIC 5ML EACH   Final   Culture  Setup Time     Final   Value: 07/18/2013 22:52     Performed at [facility]   Culture     Final   Value:        BLOOD CULTURE RECEIVED NO GROWTH TO DATE CULTURE WILL Makaia HELD FOR 5 DAYS BEFORE ISSUING A FINAL NEGATIVE REPORT     Performed at [facility]    Report Status PENDING   Incomplete  CULTURE, BLOOD (ROUTINE X 2)     Status: None   Collection Time    07/18/13  7:41 PM      Result Value Range Status   Specimen Description BLOOD LEFT ARM   Final   Special Requests BOTTLES DRAWN AEROBIC AND ANAEROBIC 4CC   Final   Culture  Setup Time     Final   Value: 07/18/2013 22:51     Performed at [facility]   Culture     Final   Value:        BLOOD CULTURE RECEIVED NO GROWTH TO DATE CULTURE WILL Ruthann HELD FOR 5 DAYS BEFORE ISSUING A FINAL NEGATIVE REPORT     Performed at [facility]   Report Status PENDING   Incomplete  CULTURE, EXPECTORATED SPUTUM-ASSESSMENT     Status: None   Collection Time    07/21/13  4:13 PM      Result Value Range Status   Specimen Description SPUTUM   Final   Special Requests NONE   Final   Sputum evaluation     Final   Value: THIS SPECIMEN IS ACCEPTABLE. RESPIRATORY CULTURE REPORT TO FOLLOW.   Report Status 07/21/2013 FINAL   Final  CULTURE, RESPIRATORY (NON-EXPECTORATED)     Status: None   Collection Time    07/21/13  4:13 PM      Result Value Range Status   Specimen Description SPUTUM   Final   Special Requests NONE   Final   Gram Stain     Final   Value: FEW WBC PRESENT, PREDOMINANTLY PMN     RARE SQUAMOUS EPITHELIAL CELLS PRESENT     NO ORGANISMS SEEN     Performed at [facility]   Culture PENDING   Incomplete   Report Status PENDING   Incomplete     Labs: Basic Metabolic Panel:  Recent Labs Lab 07/18/13 1910 07/19/13 0456 07/20/13 0138 07/22/13 0420  NA 139 139 137 137  K 3.3* 3.4* 3.6* 3.4*  CL 95* 99 97 97  CO2 32 30 30 29   GLUCOSE 159* 81 103* 98  BUN 12  9 6 10   CREATININE 0.76 0.69 0.59 0.63  CALCIUM 8.9 8.1* 8.7 8.7  MG  --   --  1.5 1.9   Liver Function Tests:  Recent Labs Lab 07/18/13 1910  AST 13  ALT 9  ALKPHOS 84  BILITOT 0.8  PROT 7.4  ALBUMIN 3.1*   No results found for this basename: LIPASE, AMYLASE,  in the last 168 hours No results  found for this basename: AMMONIA,  in the last 168 hours CBC:  Recent Labs Lab 07/18/13 1910 07/19/13 0456 07/20/13 0138 07/22/13 0420  WBC 10.6* 7.2 6.2 4.7  NEUTROABS 8.6*  --   --   --   HGB 11.3* 9.8* 9.9* 10.4*  HCT 34.0* 30.5* 31.0* 31.2*  MCV 86.3 87.9 87.1 87.6  PLT 295 222 241 222   Cardiac Enzymes:  Recent Labs Lab 07/19/13 1525 07/19/13 1918 07/20/13 0138  TROPONINI <0.30 <0.30 <0.30   BNP: No components found with this basename: POCBNP,  CBG:  Recent Labs Lab 07/18/13 2245 07/19/13 0729 07/19/13 1215  GLUCAP 176* 92 121*    Time coordinating discharge:  Greater than 30 minutes  Signed:  Altan Kraai, DO [facility] Pager: [phone] 07/22/2013, 9:00 AM

## 2013-07-22 DIAGNOSIS — R269 Unspecified abnormalities of gait and mobility: Secondary | ICD-10-CM | POA: Diagnosis not present

## 2013-07-22 DIAGNOSIS — D72829 Elevated white blood cell count, unspecified: Secondary | ICD-10-CM | POA: Diagnosis not present

## 2013-07-22 DIAGNOSIS — I10 Essential (primary) hypertension: Secondary | ICD-10-CM | POA: Diagnosis not present

## 2013-07-22 DIAGNOSIS — I6990 Unspecified sequelae of unspecified cerebrovascular disease: Secondary | ICD-10-CM | POA: Diagnosis not present

## 2013-07-22 DIAGNOSIS — G40909 Epilepsy, unspecified, not intractable, without status epilepticus: Secondary | ICD-10-CM | POA: Diagnosis not present

## 2013-07-22 DIAGNOSIS — R55 Syncope and collapse: Secondary | ICD-10-CM | POA: Diagnosis not present

## 2013-07-22 DIAGNOSIS — Z9181 History of falling: Secondary | ICD-10-CM | POA: Diagnosis not present

## 2013-07-22 DIAGNOSIS — M6281 Muscle weakness (generalized): Secondary | ICD-10-CM | POA: Diagnosis not present

## 2013-07-22 DIAGNOSIS — E119 Type 2 diabetes mellitus without complications: Secondary | ICD-10-CM | POA: Diagnosis not present

## 2013-07-22 DIAGNOSIS — IMO0001 Reserved for inherently not codable concepts without codable children: Secondary | ICD-10-CM | POA: Diagnosis not present

## 2013-07-22 DIAGNOSIS — Z5189 Encounter for other specified aftercare: Secondary | ICD-10-CM | POA: Diagnosis not present

## 2013-07-22 DIAGNOSIS — R1312 Dysphagia, oropharyngeal phase: Secondary | ICD-10-CM | POA: Diagnosis not present

## 2013-07-22 DIAGNOSIS — M109 Gout, unspecified: Secondary | ICD-10-CM | POA: Diagnosis not present

## 2013-07-22 DIAGNOSIS — M722 Plantar fascial fibromatosis: Secondary | ICD-10-CM | POA: Diagnosis not present

## 2013-07-22 DIAGNOSIS — I5032 Chronic diastolic (congestive) heart failure: Secondary | ICD-10-CM | POA: Diagnosis not present

## 2013-07-22 DIAGNOSIS — J189 Pneumonia, unspecified organism: Secondary | ICD-10-CM | POA: Diagnosis not present

## 2013-07-22 DIAGNOSIS — I509 Heart failure, unspecified: Secondary | ICD-10-CM | POA: Diagnosis not present

## 2013-07-22 LAB — BASIC METABOLIC PANEL
BUN: 10 mg/dL (ref 6–23)
CALCIUM: 8.7 mg/dL (ref 8.4–10.5)
CHLORIDE: 97 meq/L (ref 96–112)
CO2: 29 meq/L (ref 19–32)
Creatinine, Ser: 0.63 mg/dL (ref 0.50–1.10)
GFR calc Af Amer: >90 mL/min (ref >90)
GFR calc non Af Amer: 83 mL/min — ABNORMAL LOW (ref >90)
GLUCOSE: 98 mg/dL (ref 70–99)
Potassium: 3.4 mEq/L — ABNORMAL LOW (ref 3.7–5.3)
Sodium: 137 mEq/L (ref 137–147)

## 2013-07-22 LAB — CBC
HCT: 31.2 % — ABNORMAL LOW (ref 36.0–46.0)
HEMOGLOBIN: 10.4 g/dL — AB (ref 12.0–15.0)
MCH: 29.2 pg (ref 26.0–34.0)
MCHC: 33.3 g/dL (ref 30.0–36.0)
MCV: 87.6 fL (ref 78.0–100.0)
Platelets: 222 10*3/uL (ref 150–400)
RBC: 3.56 MIL/uL — ABNORMAL LOW (ref 3.87–5.11)
RDW: 13.2 % (ref 11.5–15.5)
WBC: 4.7 10*3/uL (ref 4.0–10.5)

## 2013-07-22 LAB — MAGNESIUM: MAGNESIUM: 1.9 mg/dL (ref 1.5–2.5)

## 2013-07-22 MED ORDER — SENNOSIDES-DOCUSATE SODIUM 8.6-50 MG PO TABS: 2.0000 | ORAL_TABLET | Freq: Two times a day (BID) | ORAL | Status: DC

## 2013-07-22 MED ORDER — TRAMADOL-ACETAMINOPHEN 37.5-325 MG PO TABS: ORAL_TABLET | ORAL | Status: DC

## 2013-07-22 MED ORDER — CAPSAICIN 0.025 % EX CREA: TOPICAL_CREAM | Freq: Two times a day (BID) | CUTANEOUS | Status: DC

## 2013-07-22 MED ORDER — LEVOFLOXACIN 750 MG PO TABS: 750.0000 mg | ORAL_TABLET | ORAL | Status: DC

## 2013-07-22 MED ORDER — HYDROCODONE-HOMATROPINE 5-1.5 MG/5ML PO SYRP: 5.0000 mL | ORAL_SOLUTION | Freq: Four times a day (QID) | ORAL | Status: DC | PRN

## 2013-07-22 MED ORDER — FLUOXETINE HCL 10 MG PO CAPS: 10.0000 mg | ORAL_CAPSULE | Freq: Every day | ORAL | Status: DC

## 2013-07-22 MED ORDER — LEVOFLOXACIN 750 MG PO TABS
750.0000 mg | ORAL_TABLET | Freq: Every day | ORAL | Status: DC
  Administered 2013-07-22: 750 mg via ORAL
  Filled 2013-07-22: qty 1

## 2013-07-22 MED ORDER — GABAPENTIN 300 MG PO CAPS: 300.0000 mg | ORAL_CAPSULE | Freq: Every day | ORAL | Status: DC

## 2013-07-22 MED ORDER — LEVOFLOXACIN 750 MG PO TABS: 750.0000 mg | ORAL_TABLET | Freq: Every day | ORAL | Status: DC

## 2013-07-22 MED ORDER — POTASSIUM CHLORIDE CRYS ER 20 MEQ PO TBCR
20.0000 meq | EXTENDED_RELEASE_TABLET | Freq: Once | ORAL | Status: AC
  Administered 2013-07-22: 20 meq via ORAL
  Filled 2013-07-22: qty 1

## 2013-07-22 NOTE — Progress Notes (Signed)
Patient d/c'd to SNF via EMS. Report called. Stable at D/C, daughter aware.

## 2013-07-22 NOTE — Care Management Note (Signed)
    Page 1 of 1   07/22/2013     11:23:44 AM   CARE MANAGEMENT NOTE 07/22/2013  [facility]   Account Number:  [id number]  Date Initiated:  07/19/2013  Documentation initiated by:  Ezekiel InaMcGIBBONEY,COOKIE  Subjective/Objective Assessment:   Pt admitted with cco PNA     Action/Plan:   from home   Anticipated DC Date:  07/22/2013   Anticipated DC Plan:  SKILLED NURSING FACILITY  In-house referral  Clinical Social Worker      DC Planning Services  CM consult      Choice offered to / List presented to:             Status of service:  Completed, signed off Medicare Important Message given?  NA - LOS <3 / Initial given by admissions (If response is "NO", the following Medicare IM given date fields will Tae blank) Date Medicare IM given:   Date Additional Medicare IM given:    Discharge Disposition:  HOME/SELF CARE  Per UR Regulation:  Reviewed for med. necessity/level of care/duration of stay  If discussed at Long Length of Stay Meetings, dates discussed:    Comments:  07-22-13 Lorenda IshiharaSuzanne Rey Fors RN CM 1100 After going back and forth between taking the patient home and going to a skilled nursing facility, the patient's family ultimately decided to allow the patient to a skilled nursing facility for physical therapy rehabilitation, CSW aware.  07/19/13 MMcGibboney, RN, BSN Chart reviewed.

## 2013-07-23 LAB — CULTURE, RESPIRATORY W GRAM STAIN: Culture: NO GROWTH

## 2013-07-23 LAB — CULTURE, RESPIRATORY

## 2013-07-23 NOTE — Progress Notes (Signed)
Pt returned to [facility] via [facility] transport on 07/22/13. Pt / Family were in agreement with d/c plan.  Cori RazorJamie Elva Mauro LCSW [phone]

## 2013-07-24 LAB — CULTURE, BLOOD (ROUTINE X 2)
CULTURE: NO GROWTH
CULTURE: NO GROWTH

## 2013-07-27 ENCOUNTER — Non-Acute Institutional Stay (SKILLED_NURSING_FACILITY): Payer: Medicare Other | Admitting: Internal Medicine

## 2013-07-27 DIAGNOSIS — E1165 Type 2 diabetes mellitus with hyperglycemia: Secondary | ICD-10-CM

## 2013-07-27 DIAGNOSIS — I6990 Unspecified sequelae of unspecified cerebrovascular disease: Secondary | ICD-10-CM | POA: Diagnosis not present

## 2013-07-27 DIAGNOSIS — R269 Unspecified abnormalities of gait and mobility: Secondary | ICD-10-CM | POA: Diagnosis not present

## 2013-07-27 DIAGNOSIS — IMO0001 Reserved for inherently not codable concepts without codable children: Secondary | ICD-10-CM | POA: Diagnosis not present

## 2013-07-27 DIAGNOSIS — J189 Pneumonia, unspecified organism: Secondary | ICD-10-CM

## 2013-07-27 DIAGNOSIS — Y95 Nosocomial condition: Principal | ICD-10-CM

## 2013-07-27 NOTE — Progress Notes (Signed)
Patient ID: Margaret [name], female   DOB: 08/07/1931, 79 y.o.   MRN: [id number]  Nursing Home Admission [facility] SNF Chief Complaint; admission to SNF postdate [place] from 1/22 through 1/26  This is a patient who actually admitted to the facility in early January after a fall at home she suffered a T12 fracture. Workup showed significant osteoarthritis as well as a multiple cerebral infarction syndrome. Apparently she was actually discharged from this facility to home however only to [place] readmitted to the hospital 2 days later with fever. She was felt to have helped her quite a pneumonia. Her influenza PCR was negative. She is discharged on Levaquin.  Her original admission to this facility occurred after she was brought in by her granddaughter to the emergency room after a fall at home. The patient apparently felt a sensation of dizziness and then fell backwards she hit her head but did not lose consciousness CT scan of the head showed old lacunar infarcts and small vessel disease. This was followed up with an MRI which showed extensive prior multi-infarct state. Lumbar spine imaging showed a T12 fracture for which neurosurgery was consulted. .  Past Medical History  Diagnosis Date  . Hypertension   . Diabetes mellitus   . Gout    . Past Surgical History  Procedure Laterality Date  . No past surgeries     Current Outpatient Prescriptions on File Prior to Visit  Medication Sig Dispense Refill  . aspirin 81 MG chewable tablet Chew 1 tablet (81 mg total) by mouth daily.      . capsaicin (ZOSTRIX) 0.025 % cream Apply topically 2 (two) times daily. Apply to legs/calves and ankles bid  60 g  0  . FLUoxetine (PROZAC) 10 MG capsule Take 1 capsule (10 mg total) by mouth at bedtime.  30 capsule  1  . furosemide (LASIX) 40 MG tablet Take 40 mg by mouth every morning.       . gabapentin (NEURONTIN) 300 MG capsule Take 1 capsule (300 mg total) by mouth at bedtime.  30 capsule  1  . guaiFENesin  (MUCINEX) 600 MG 12 hr tablet Take 600 mg by mouth 2 (two) times daily as needed for to loosen phlegm.      Marland Kitchen HYDROcodone-homatropine (HYCODAN) 5-1.5 MG/5ML syrup Take 5 mLs by mouth every 6 (six) hours as needed for cough.  120 mL  0  . levofloxacin (LEVAQUIN) 750 MG tablet Take 1 tablet (750 mg total) by mouth every other day. First dose on 07/24/13 AM  2 tablet  0  . losartan (COZAAR) 25 MG tablet Take 25 mg by mouth every morning.       . metFORMIN (GLUCOPHAGE-XR) 500 MG 24 hr tablet Take 500 mg by mouth every evening.       . senna-docusate (SENOKOT-S) 8.6-50 MG per tablet Take 2 tablets by mouth 2 (two) times daily.  60 tablet  0  . traMADol-acetaminophen (ULTRACET) 37.5-325 MG per tablet Take one tablet by mouth every four hours as needed for pain; Take two tablets by mouth every four hours as needed for severe pain  60 tablet  0   No current facility-administered medications on file prior to visit.    Social history; there is very little information available.  questionably the patient is from [place].  Family history; not available from any current source  Review of systems; not possible from the patient she is complaining of low back pain and left heel pain  Physical examination  Gen. frail 80 year old woman in no distress. Vitals O2 sat 99% on 2l pulse 92 respirations 18 and unlabored Respiratory; shallow air entry bilaterally but no crackles or wheezes Cardiac heart sounds are normal no murmurs no carotid bruits Abdomen; no masses no liver no spleen GU bladder is not enlarged at the bedside  Musculoskeletal; patient has significant osteoarthritis of her knees nevertheless she is antigravity strength in her arms and legs. She has marked tenderness in the posterior left heel close to the point of insertion of her Achilles tendon Gait; she could barely bring herself to a sitting position in bed tending to lean to the right. She retropulsed when attempting to stand with maximal  assistance of one person Neurologic; in spite of the impressive MRI shown below there is very little lateralizing findings. Mental status; although the patient speaks her little [place] she was able to communicate the she lives in [place].   Impression/plan #1 treated for healthcare acquired pneumonia finishing Levaquin #2 very disabled frail woman probably mostly as a result of a combination of significant osteoarthritis, a multiple cerebral infarctions state and post fall syndrome. Her reflexes were maintained at the knees both plantar responses were downgoing is difficult to say whether she has significant diabetic neuropathy. She was very anxious when I attempted to stand her up  #3 type 2 diabetes on metformin we'll check a hemoglobin A1c. Her hemoglobin A1c was 6 wonder whether she really needs to continue on the metformin all #4diastolically mediated heart failure she is on Lasix 40 mg. I believe her Cozaar was stopped in the hospital due to low blood pressures although it is on her list currently  #5 T12 fracture with probable osteoporosis. We'll check a 25-hydroxy vitamin D level on her #6 left heel pain of uncertain etiology. I will do an x-ray of the left calcaneus #7 physical deconditioning listed in the hospital however I think this lady has a very significant multi-infarct state. I think she is going to require assistance from her family                       [place], [place] [address]                         [phone]   ------------------------------------------------------------ Transthoracic Echocardiography  Patient:    Margaret [name], Margaret [name] MR #:       [id number] Study Date: 06/23/2013 Gender:     F Age:        62 Height:     162.6cm Weight:     51.3kg BSA:        1.77m^2 Pt. Status: Room:       WA22    PERFORMING   [facility]  ATTENDING    [place]  ADMITTING    Kathlen Mody  Ola Spurr, [place]  SONOGRAPHER  Arvil Chaco cc:  ------------------------------------------------------------ LV EF: 65% -   70%  ------------------------------------------------------------ Indications:      Syncope 780.2.  ------------------------------------------------------------ History:   Risk factors:  Hypertension. Diabetes mellitus.   ------------------------------------------------------------ Study Conclusions  Left ventricle: The cavity size was normal. There was mild focal basal hypertrophy of the septum. Systolic function was vigorous. The estimated ejection fraction was in the ran of 65% to 70%. Although no diagnostic regional wall motion abnormality was identified, this possibility cannot Amalie completely excluded on the basis of this study. Doppler parameters are consistent with abnormal left ventricular  relaxation (grade 1 diastolic dysfunction). Doppler parameters are consistent with mildly elevated mean left atrial filling pressure.              Transthoracic echocardiography.  M-mode, complete 2D, spectral Doppler, and color Doppler.  Height:  Height: 162.6cm. Height: 64in. Weight:  Weight: 51.3kg. Weight: 112.8lb.  Body mass index: BMI: 19.4kg/m^2.  Body surface area:    BSA: 1.6330m^2.  Blood pressure:     159/82.  Patient status:  Inpatient. Location:  Bedside.  ------------------------------------------------------------  ------------------------------------------------------------ Left ventricle:  The cavity size was normal. There was mild focal basal hypertrophy of the septum. Systolic function was vigorous. The estimated ejection fraction was in the range of 65% to 70%. Although no diagnostic regional wall motion abnormality was identified, this possibility cannot Bobbijo completely excluded on the basis of this study. Doppler parameters are consistent with abnormal left ventricular relaxation (grade 1 diastolic dysfunction). Doppler parameters are consistent with mildly elevated mean  left atrial filling pressure.  ------------------------------------------------------------ Aortic valve:  Poorly visualized.  The valve appears to Leonda grossly normal. Trileaflet; normal thickness leaflets. Mobility was not restricted.  Doppler:  Transvalvular velocity was within the normal range. There was no stenosis.  No regurgitation.  ------------------------------------------------------------ Aorta:  The aorta was poorly visualized. Aortic root: The aortic root was normal in size. Ascending aorta: The ascending aorta was normal in size.  ------------------------------------------------------------ Mitral valve:   Structurally normal valve.   Mobility was not restricted.  Doppler:  Transvalvular velocity was within the normal range. There was no evidence for stenosis.  No regurgitation.  ------------------------------------------------------------ Left atrium:  The atrium was at the upper limits of normal in size.  ------------------------------------------------------------ Right ventricle:  The cavity size was normal. Wall thickness was normal. Systolic function was normal.  ------------------------------------------------------------ Pulmonic valve:   Not visualized.  Doppler:  Transvalvular velocity was within the normal range. There was no evidence for stenosis.  No significant regurgitation.  ------------------------------------------------------------ Tricuspid valve:   Structurally normal valve.    Doppler: Transvalvular velocity was within the normal range.  No regurgitation.  ------------------------------------------------------------ Pulmonary artery:   The main pulmonary artery was normal-sized.  Systolic pressure could not Ailah accurately estimated.  ------------------------------------------------------------ Right atrium:  The atrium was normal in size.  ------------------------------------------------------------ Pericardium:  There was no  pericardial effusion.  ------------------------------------------------------------ Systemic veins: Inferior vena cava: Not visualized.  ------------------------------------------------------------ Post procedure conclusions Ascending Aorta:  - The aorta was poorly visualized.  ------------------------------------------------------------  2D measurements        Normal  Doppler               Normal Left ventricle                 measurements LVID ED,   26.8 mm     43-52   Left ventricle chord,                         Ea, lat      5.44 cm/ ------- PLAX                           ann, tiss         s LVID ES,     20 mm     23-38   DP chord,                         E/Ea, lat  10.97     ------- PLAX                           ann, tiss FS, chord,   25 %      >29     DP PLAX                           Ea, med       4.9 cm/ ------- LVPW, ED   10.3 mm     ------  ann, tiss         s IVS/LVPW   1.28        <1.3    DP ratio, ED                      E/Ea, med   12.18     ------- Ventricular septum             ann, tiss IVS, ED    13.2 mm     ------  DP Aorta                          Mitral valve Root diam,   30 mm     ------  Peak E vel   59.7 cm/ ------- ED                                               s Left atrium                    Peak A vel    101 cm/ ------- AP dim       31 mm     ------                    s AP dim     2.03 cm/m^2 <2.2    Deceleratio   264 ms  150-230 index                          n time                                Peak E/A      0.6     -------                                ratio                                Systemic veins                                Estimated       7 mm  -------                                CVP               Hg  Right ventricle                                Sa vel, lat  13.9 cm/ -------                                ann, tiss         s                                DP    ------------------------------------------------------------ Prepared and Electronically Authenticated by  Bryan Lemma 2014-12-29T12:37:27.863     CLINICAL DATA:  Syncope, fall with headache and back pain, nausea. Prior mastoidectomy.   EXAM: MRI HEAD WITHOUT CONTRAST   TECHNIQUE: Multiplanar, multiecho pulse sequences of the brain and surrounding structures were obtained without intravenous contrast.   COMPARISON:  CT of the head June 21, 2013 at 1554 hr.   FINDINGS: No reduced diffusion to suggest acute ischemia.   Moderate ventriculomegaly, with mild disproportionate sulcal effacement at the convexities. Bilateral basal ganglia fluid signal lacunar infarcts, addition to subcentimeter right thalamic remote lacunar infarcts with minimal surrounding gliosis. Prominent right inferior occipital sulcus suggests underlying parenchymal volume loss. Confluent supratentorial white matter FLAIR T2 hyperintensities without midline shift nor mass effect. Remote small left cerebellar infarct associated with microhemorrhage. Bilateral thalami punctate foci of susceptibility artifact, in addition to a few scattered foci of susceptibility artifact within the deep white matter. No intrinsic T1 shortening to suggest subacute blood products.   No abnormal extra-axial fluid collections. Normal major intracranial vascular flow voids observed at the skull base.   Status post right wall down mastoidectomy, with fluid signal within the left middle ear and mastoid air cells, and right middle ear. No suspicious calvarial bone marrow signal, generalized bright T1 bone marrow signal favors osteopenia. Fluid filled non expanded sella. Craniocervical junction maintained. Patient is edentulous. Status post bilateral ocular lens implants.   IMPRESSION: No acute intracranial process, specifically no evidence of acute ischemia.   Moderate to severe white matter changes suggest chronic small  vessel ischemic disease with remote bilateral basal ganglia, right thalamic lacunar infarcts, and small remote left cerebellar hemorrhagic lacunar infarct. Scattered micro hemorrhages in the supratentorial brain favors sequelae of chronic hypertension.   Mild sulcal effacement at this convexities which can Sharyl associated with normal pressure hydrocephalus.   Status post right wall down mastoidectomy with fluid within the bilateral middle ears and left mastoid air cells, which may reflect eustachian tube dysfunction.     Electronically Signed   By: Awilda Metro   On: 06/21/2013 22:06

## 2013-07-29 ENCOUNTER — Non-Acute Institutional Stay (SKILLED_NURSING_FACILITY): Payer: Medicare Other | Admitting: Internal Medicine

## 2013-07-29 DIAGNOSIS — M722 Plantar fascial fibromatosis: Secondary | ICD-10-CM | POA: Diagnosis not present

## 2013-07-29 NOTE — Progress Notes (Signed)
         PROGRESS NOTE  DATE: 07/29/2013  [facility]  LEVEL OF CARE: SNF (31)  Acute Visit  CHIEF COMPLAINT:  Manage left heel pain  HISTORY OF PRESENT ILLNESS: I was requested by the staff to assess the patient regarding above problem(s):  Staff reports that patient's left heel is exquisitely tender to palpation. No erythema, edema or warmth reported. Patient is a poor historian due to language barrier. Staff cannot identify precipitating or alleviating factors. There is no temporal relationship. There are no other associated signs and symptoms.  PAST MEDICAL HISTORY : Reviewed.  No changes.  CURRENT MEDICATIONS: [facility]  REVIEW OF SYSTEMS: Unobtainable due to language barrier  PHYSICAL EXAMINATION  VS:  T 97.5        P 78       RR 16       BP 108/69      POX % 96     GENERAL: no acute distress, normal body habitus RESPIRATORY: breathing is even & unlabored, BS CTAB CARDIAC: RRR, no murmur,no extra heart sounds, no edema GI: abdomen soft, normal BS, no masses, no tenderness, no hepatomegaly, no splenomegaly PSYCHIATRIC: the patient is alert & oriented to person, affect & behavior appropriate MUSCULOSKELETAL: Left heel is exquisitely tender to palpation. No erythema, edema or warmth.  ASSESSMENT/PLAN:  plantar fasciitis-new problem. X-ray pending. Start Naprosyn 500 mg twice a day for 10 days with food.  CPT CODE: 1610999308

## 2013-09-05 DIAGNOSIS — S22009A Unspecified fracture of unspecified thoracic vertebra, initial encounter for closed fracture: Secondary | ICD-10-CM | POA: Diagnosis not present

## 2013-10-14 ENCOUNTER — Other Ambulatory Visit: Payer: Self-pay | Admitting: *Deleted

## 2013-10-14 MED ORDER — TRAMADOL-ACETAMINOPHEN 37.5-325 MG PO TABS: ORAL_TABLET | ORAL | Status: DC

## 2013-10-14 NOTE — Telephone Encounter (Signed)
[facility] 

## 2013-10-17 ENCOUNTER — Non-Acute Institutional Stay (SKILLED_NURSING_FACILITY): Payer: Medicare Other | Admitting: Internal Medicine

## 2013-10-17 DIAGNOSIS — G609 Hereditary and idiopathic neuropathy, unspecified: Secondary | ICD-10-CM

## 2013-10-17 DIAGNOSIS — K5900 Constipation, unspecified: Secondary | ICD-10-CM | POA: Diagnosis not present

## 2013-10-17 DIAGNOSIS — E119 Type 2 diabetes mellitus without complications: Secondary | ICD-10-CM

## 2013-10-17 DIAGNOSIS — I10 Essential (primary) hypertension: Secondary | ICD-10-CM

## 2013-10-18 DIAGNOSIS — E1142 Type 2 diabetes mellitus with diabetic polyneuropathy: Secondary | ICD-10-CM | POA: Insufficient documentation

## 2013-10-18 DIAGNOSIS — K5900 Constipation, unspecified: Secondary | ICD-10-CM | POA: Insufficient documentation

## 2013-10-18 DIAGNOSIS — E1140 Type 2 diabetes mellitus with diabetic neuropathy, unspecified: Secondary | ICD-10-CM | POA: Insufficient documentation

## 2013-10-18 HISTORY — DX: Type 2 diabetes mellitus with diabetic neuropathy, unspecified: E11.40

## 2013-10-18 HISTORY — DX: Constipation, unspecified: K59.00

## 2013-10-18 NOTE — Progress Notes (Signed)
         PROGRESS NOTE  DATE: 10/17/2013  FACILITY: Nursing Home Location: [facility]  LEVEL OF CARE: SNF (31)  Routine Visit  CHIEF COMPLAINT:  Manage diabetes mellitus, peripheral neuropathy and hypertension  HISTORY OF PRESENT ILLNESS:  DM:pt's DM remains stable.  Pt denies polyuria, polydipsia, polyphagia, changes in vision or hypoglycemic episodes.  No complications noted from the medication presently being used.  Last hemoglobin A1c is: Not available.  PERIPHERAL NEUROPATHY: The peripheral neuropathy is stable. The patient denies pain in the feet, tingling, and numbness. No complications noted from the medication presently being used.  HTN: Pt 's HTN remains stable.  Denies CP, sob, DOE, pedal edema, headaches, dizziness or visual disturbances.  No complications from the medications currently being used.  Last BP : 110/66  REASSESSMENT OF ONGOING PROBLEM(S):  PAST MEDICAL HISTORY : Reviewed.  No changes/see problem list  CURRENT MEDICATIONS: Reviewed per MAR/see medication list  REVIEW OF SYSTEMS:  GENERAL: no change in appetite, no fatigue, no weight changes, no fever, chills or weakness RESPIRATORY: no cough, SOB, DOE, wheezing, hemoptysis CARDIAC: no chest pain, edema or palpitations GI: no abdominal pain, diarrhea, constipation, heart burn, nausea or vomiting  PHYSICAL EXAMINATION  VS:  See VS section  GENERAL: no acute distress, normal body habitus EYES: conjunctivae normal, sclerae normal, normal eye lids NECK: supple, trachea midline, no neck masses, no thyroid tenderness, no thyromegaly LYMPHATICS: no LAN in the neck, no supraclavicular LAN RESPIRATORY: breathing is even & unlabored, BS CTAB CARDIAC: RRR, no murmur,no extra heart sounds, no edema GI: abdomen soft, normal BS, no masses, no tenderness, no hepatomegaly, no splenomegaly PSYCHIATRIC: the patient is alert & oriented to person, affect & behavior  appropriate  LABS/RADIOLOGY:  2-15 CO2 35 otherwise BMP normal  ASSESSMENT/PLAN:  Diabetes mellitus-check hemoglobin A1c Peripheral neuropathy-stable Hypertension-well-controlled Constipation-well-controlled Depression-on fluoxetine Check CBC and liver profile  CPT CODE: 1610999309  Angela CoxGayani Y Dasanayaka, MD [facility] [phone]

## 2013-10-22 ENCOUNTER — Non-Acute Institutional Stay (SKILLED_NURSING_FACILITY): Payer: Medicare Other | Admitting: Internal Medicine

## 2013-10-22 DIAGNOSIS — E119 Type 2 diabetes mellitus without complications: Secondary | ICD-10-CM | POA: Diagnosis not present

## 2013-10-22 DIAGNOSIS — D649 Anemia, unspecified: Secondary | ICD-10-CM | POA: Diagnosis not present

## 2013-10-22 DIAGNOSIS — M545 Low back pain, unspecified: Secondary | ICD-10-CM

## 2013-10-23 DIAGNOSIS — M545 Low back pain, unspecified: Secondary | ICD-10-CM

## 2013-10-23 DIAGNOSIS — S22009A Unspecified fracture of unspecified thoracic vertebra, initial encounter for closed fracture: Secondary | ICD-10-CM | POA: Diagnosis not present

## 2013-10-23 HISTORY — DX: Low back pain, unspecified: M54.50

## 2013-10-23 NOTE — Progress Notes (Signed)
Patient ID: Margaret [name], female   DOB: 09/12/1931, 80 y.o.   MRN: [id number]            PROGRESS NOTE  DATE: 10/22/2013    [facility]  LEVEL OF CARE: SNF (31)  Acute Visit  CHIEF COMPLAINT:  Manage low back pain.    HISTORY OF PRESENT ILLNESS: I was requested by the staff to assess the patient regarding above problem(s):     LOW BACK PAIN:  Patient's low back pain is uncontrolled problem.  Patient is requesting a p.r.n. cream for the back.  She denies radiation of the pain.  She denies numbness, tingling, or weakness Patient denies stiffness, lower extremity weakness, or paresthesias. No complications reported from the medication(s) presently being used.    PAST MEDICAL HISTORY : Reviewed.  No changes/see problem list  CURRENT MEDICATIONS: Reviewed per MAR/see medication list  PHYSICAL EXAMINATION  VS:  T 98.2       P 101      RR 18      BP 132/92      WT (Lb) 104        GENERAL: no acute distress, normal body habitus RESPIRATORY: breathing is even & unlabored, BS CTAB CARDIAC: RRR, no murmur,no extra heart sounds, no edema   MUSCULOSKELETAL:   BACK:  Low back nontender to palpation.    ASSESSMENT/PLAN:    Low back pain.  Start Bengay b.i.d. p.r.n. on patient request.    CPT CODE: [id number]  Angela CoxGayani Y Naysa Puskas, MD [facility] [phone]

## 2013-10-31 DIAGNOSIS — I10 Essential (primary) hypertension: Secondary | ICD-10-CM | POA: Diagnosis not present

## 2013-10-31 DIAGNOSIS — E119 Type 2 diabetes mellitus without complications: Secondary | ICD-10-CM | POA: Diagnosis not present

## 2013-10-31 DIAGNOSIS — IMO0001 Reserved for inherently not codable concepts without codable children: Secondary | ICD-10-CM | POA: Diagnosis not present

## 2013-11-11 ENCOUNTER — Non-Acute Institutional Stay (SKILLED_NURSING_FACILITY): Payer: Medicare Other | Admitting: Internal Medicine

## 2013-11-11 ENCOUNTER — Encounter: Payer: Self-pay | Admitting: Internal Medicine

## 2013-11-11 DIAGNOSIS — I5032 Chronic diastolic (congestive) heart failure: Secondary | ICD-10-CM | POA: Diagnosis not present

## 2013-11-11 DIAGNOSIS — E876 Hypokalemia: Secondary | ICD-10-CM

## 2013-11-11 DIAGNOSIS — R5381 Other malaise: Secondary | ICD-10-CM

## 2013-11-11 DIAGNOSIS — G609 Hereditary and idiopathic neuropathy, unspecified: Secondary | ICD-10-CM

## 2013-11-11 DIAGNOSIS — I10 Essential (primary) hypertension: Secondary | ICD-10-CM

## 2013-11-11 DIAGNOSIS — E119 Type 2 diabetes mellitus without complications: Secondary | ICD-10-CM

## 2013-11-11 NOTE — Progress Notes (Signed)
Patient ID: Margaret [name], female   DOB: Jul 20, 1931, 80 y.o.   MRN: [id number]   This is a routine visit.  Level of care skilled.  Facility AF.    Chief Complaint-- medical management diabetes type 2-hypertension-history CVA-diastolic CHF-physical deconditioning-peripheral neuropathy  History of present illness  This is a patient who actually admitted to the facility in early January after a fall at home she suffered a T12 fracture. Workup showed significant osteoarthritis as well as a multiple cerebral infarction syndrome. Apparently she was actually discharged from this facility to home however only to Lerlene readmitted to the hospital 2 days later with fever. She was felt to have helped her quite a pneumonia. Her influenza PCR was negative. She is discharged on Levaquin.  Her original admission to this facility occurred after she was brought in by her granddaughter to the emergency room after a fall at home. The patient apparently felt a sensation of dizziness and then fell backwards she hit her head but did not lose consciousness CT scan of the head showed old lacunar infarcts and small vessel disease. This was followed up with an MRI which showed extensive prior multi-infarct state. Lumbar spine imaging showed a T12 fracture for which neurosurgery was consulted Patient did recently complain of back pain and was given apparently BenGay this apparently did help some-she has Ultracet when necessary but apparently rarely takes this  In regards to hypertension blood pressure appears to Zenobia well controlled recent blood pressures 122/80 116/68 126/78--she is on Losartan.  In regards to diabetes she continues on Glucophage.--CBGs appeared quite stable 90s-low 100s recent hemoglobin A1c was satisfactory at 5.6  In regard to CHF she is on Lasix 40 mg a day she is not on potassium I do note recent metabolic panel showed a potassium of 3.2 we will have to supplement this--clinically she continues to Kian  stable  It appears she's gained about 4 pounds the past month althoug  Eats h apparently this is thought to Ellyce appetite related family does bring in snacks frequently and she quite well.    . .  Past Medical History   Diagnosis  Date   .  Hypertension    .  Diabetes mellitus    .  Gout    .  Past Surgical History   Procedure  Laterality  Date   .  No past surgeries      Current Outpatient Prescriptions on File Prior to Visit   Medication  Sig  Dispense  Refill   .  aspirin 81 MG chewable tablet  Chew 1 tablet (81 mg total) by mouth daily.     .  capsaicin (ZOSTRIX) 0.025 % cream  Apply topically 2 (two) times daily. Apply to legs/calves and ankles bid  60 g  0   .  FLUoxetine (PROZAC) 10 MG capsule  Take 1 capsule (10 mg total) by mouth at bedtime.  30 capsule  1   .  furosemide (LASIX) 40 MG tablet  Take 40 mg by mouth every morning.     .  gabapentin (NEURONTIN) 300 MG capsule  Take 1 capsule (300 mg total) by mouth at bedtime.  30 capsule  1   .  guaiFENesin (MUCINEX) 600 MG 12 hr tablet  Take 600 mg by mouth 2 (two) times daily as needed for to loosen phlegm.     Marland Kitchen.  HYDROcodone-homatropine (HYCODAN) 5-1.5 MG/5ML syrup  Take 5 mLs by mouth every 6 (six) hours as needed for cough.  120 mL  0   .     2  0   .  losartan (COZAAR) 25 MG tablet  Take 25 mg by mouth every morning.     .  metFORMIN (GLUCOPHAGE-XR) 500 MG 24 hr tablet  Take 500 mg by mouth every evening.     .  senna-docusate (SENOKOT-S) 8.6-50 MG per tablet  Take 2 tablets by mouth 2 (two) times daily.  60 tablet  0   .  traMADol-acetaminophen (ULTRACET) 37.5-325 MG per tablet  Take one tablet by mouth every four hours as needed for pain; Take two tablets by mouth every four hours as needed for severe pain  60 tablet  0    .   Social history; there is very little information available. questionably the patient is from [place].   Family history; not available from any current source   Review of systems; Limited  secondary to patient does not speak English-she appears to have no complaints other than complaining of back pain at times    Physical examination  Temperature 97.2 pulse 69 respirations 18 blood pressure 122/81. Weight is 108 pounds.  In general this is somewhat frail pleasant elderly female in no distress lying completely bed.  Her skin is warm and dry.  Oropharynx clear mucous membranes moist.  Chest is clear to auscultation no labored breathing     Cardiac heart sounds are normal no murmurs no carotid bruits--rate is regular   Abdomen; soft nontender with positive bowel sounds  Muscle skeletal and did not note any form of a tobacco there is some tenderness to palpation lower thoracic upper sacral area-she moves all extremities x4 --arthritic changes of her l knees bilaterally  Neurologic-do not really see any lateralizing findings cranial nerves appear grossly intact.  Psych again she speaks little English was pleasant easily follow verbal commands with prompting     Labs.  10/31/2013.  Sodium 140 potassium 3.2 BUN 14 creatinine 0.8.  10/22/2013.  CBC 4.2 hemoglobin 11.5 platelets 214.  Hemoglobin A1c 5.6.  Liver function tests within normal limits except albumin of 3.4.    Impression/plan  #1  back pain-apparently this is somewhat chronic she does not take her when necessary medication often BenGay apparently is helping secondary to chronicity would like to x-ray area of thoracic and sacral spine although I suspect this will show degenerative changes     #2 type 2 diabetes on metformin --as appears stable CBGs hemoglobin A1c satisfactory   #3iastolically mediated heart failure she is on Lasix 40 mg as well as Cozaar-her potassium is low at 3.2 we will give her 40 mEq today and then 20 mEq twice a day and order a stat BMP tomorrow to get a current level--reduce potassium to 20 mEq a day 3 -- may have to alter this secondary to lab results . Will also order  weights q. weekly notify provider gain greater than 3 pounds   #4-history of peripheral neuropathy most likely diabetic she is onNeurontin  #5 History of depression-continues on Prozac  [id number]  .

## 2013-11-12 DIAGNOSIS — M546 Pain in thoracic spine: Secondary | ICD-10-CM | POA: Diagnosis not present

## 2013-11-12 DIAGNOSIS — M533 Sacrococcygeal disorders, not elsewhere classified: Secondary | ICD-10-CM | POA: Diagnosis not present

## 2013-11-13 DIAGNOSIS — R262 Difficulty in walking, not elsewhere classified: Secondary | ICD-10-CM | POA: Diagnosis not present

## 2013-11-13 DIAGNOSIS — I10 Essential (primary) hypertension: Secondary | ICD-10-CM | POA: Diagnosis not present

## 2013-11-13 DIAGNOSIS — Z4689 Encounter for fitting and adjustment of other specified devices: Secondary | ICD-10-CM | POA: Diagnosis not present

## 2013-11-13 DIAGNOSIS — J189 Pneumonia, unspecified organism: Secondary | ICD-10-CM | POA: Diagnosis not present

## 2013-11-14 DIAGNOSIS — Z4689 Encounter for fitting and adjustment of other specified devices: Secondary | ICD-10-CM | POA: Diagnosis not present

## 2013-11-14 DIAGNOSIS — J189 Pneumonia, unspecified organism: Secondary | ICD-10-CM | POA: Diagnosis not present

## 2013-11-14 DIAGNOSIS — R262 Difficulty in walking, not elsewhere classified: Secondary | ICD-10-CM | POA: Diagnosis not present

## 2013-11-15 DIAGNOSIS — J189 Pneumonia, unspecified organism: Secondary | ICD-10-CM | POA: Diagnosis not present

## 2013-11-15 DIAGNOSIS — Z4689 Encounter for fitting and adjustment of other specified devices: Secondary | ICD-10-CM | POA: Diagnosis not present

## 2013-11-15 DIAGNOSIS — R262 Difficulty in walking, not elsewhere classified: Secondary | ICD-10-CM | POA: Diagnosis not present

## 2013-11-21 DIAGNOSIS — I10 Essential (primary) hypertension: Secondary | ICD-10-CM | POA: Diagnosis not present

## 2013-12-03 DIAGNOSIS — I10 Essential (primary) hypertension: Secondary | ICD-10-CM | POA: Diagnosis not present

## 2013-12-13 ENCOUNTER — Encounter: Payer: Self-pay | Admitting: Internal Medicine

## 2013-12-13 ENCOUNTER — Non-Acute Institutional Stay (SKILLED_NURSING_FACILITY): Payer: Medicare Other | Admitting: Internal Medicine

## 2013-12-13 DIAGNOSIS — E119 Type 2 diabetes mellitus without complications: Secondary | ICD-10-CM

## 2013-12-13 DIAGNOSIS — N63 Unspecified lump in unspecified breast: Secondary | ICD-10-CM

## 2013-12-13 DIAGNOSIS — N6310 Unspecified lump in the right breast, unspecified quadrant: Secondary | ICD-10-CM

## 2013-12-13 DIAGNOSIS — I5032 Chronic diastolic (congestive) heart failure: Secondary | ICD-10-CM | POA: Diagnosis not present

## 2013-12-13 DIAGNOSIS — I10 Essential (primary) hypertension: Secondary | ICD-10-CM

## 2013-12-13 DIAGNOSIS — G609 Hereditary and idiopathic neuropathy, unspecified: Secondary | ICD-10-CM

## 2013-12-13 HISTORY — DX: Unspecified lump in the right breast, unspecified quadrant: N63.10

## 2013-12-13 NOTE — Progress Notes (Signed)
Patient ID: Margaret [name], female   DOB: 1932-06-12, 80 y.o.   MRN: [id number]   This is a routine visit.  Level of care skilled.  Facility AF.   Chief Complaint-- medical management diabetes type 2-hypertension-history CVA-diastolic CHF-physical deconditioning-peripheral neuropathy   History of present illness  This is a patient who actually admitted to the facility in early January after a fall at home she suffered a T12 fracture. Workup showed significant osteoarthritis as well as a multiple cerebral infarction syndrome. Apparently she was actually discharged from this facility to home however only to Margaret [name] readmitted to the hospital 2 days later with fever. She was felt to have helped her quite a pneumonia. Her influenza PCR was negative. She is discharged on Levaquin.  Her original admission to this facility occurred after she was brought in by her granddaughter to the emergency room after a fall at home. The patient apparently felt a sensation of dizziness and then fell backwards she hit her head but did not lose consciousness CT scan of the head showed old lacunar infarcts and small vessel disease. This was followed up with an MRI which showed extensive prior multi-infarct state. Lumbar spine imaging showed a T12 fracture for which neurosurgery was consulted  Patient did recently complain of back pain and was given apparently BenGay this apparently did help some-she has Ultracet when necessary but apparently rarely takes this  In regards to hypertension blood pressure appears to Margaret [name] well controlled recent blood pressures 1 19/67-122/85-she is on Losartan.  In regards to diabetes she continues on Glucophage.--CBGs appeared quite stable 90s-low 100s recent hemoglobin A1c was satisfactory at 5.8  In regard to CHF she is on Lasix 40 mg a day she is not on potassium-- w back in mid May itwas low at 3.2 we supplemented this for a few days and it normalized last updated lab on June 9shows normal potassium is  3.9-family has requested no further labs Earlier this week patient did complain of some right breast discomfort I evaluated her and found a firm area in the mid breast it was not acutely tender but palpable--I am following up on this today .  . .  Past Medical History   Diagnosis  Date   .  Hypertension    .  Diabetes mellitus    .  Gout    .  Past Surgical History   Procedure  Laterality  Date   .  No past surgeries      Current Outpatient Prescriptions on File Prior to Visit   Medication  Sig  Dispense  Refill   .  aspirin 81 MG chewable tablet  Chew 1 tablet (81 mg total) by mouth daily.     .  capsaicin (ZOSTRIX) 0.025 % cream  Apply topically 2 (two) times daily. Apply to legs/calves and ankles bid  60 g  0   .  FLUoxetine (PROZAC) 10 MG capsule  Take 1 capsule (10 mg total) by mouth at bedtime.  30 capsule  1   .  furosemide (LASIX) 40 MG tablet  Take 40 mg by mouth every morning.     .  gabapentin (NEURONTIN) 300 MG capsule  Take 1 capsule (300 mg total) by mouth at bedtime.  30 capsule  1   .  guaiFENesin (MUCINEX) 600 MG 12 hr tablet  Take 600 mg by mouth 2 (two) times daily as needed for to loosen phlegm.     Marland Kitchen.  HYDROcodone-homatropine (HYCODAN) 5-1.5 MG/5ML syrup  Take 5 mLs by mouth every 6 (six) hours as needed for cough.  120 mL  0   .     2  0   .  losartan (COZAAR) 25 MG tablet  Take 25 mg by mouth every morning.     .  metFORMIN (GLUCOPHAGE-XR) 500 MG 24 hr tablet  Take 500 mg by mouth every evening.     .  senna-docusate (SENOKOT-S) 8.6-50 MG per tablet  Take 2 tablets by mouth 2 (two) times daily.  60 tablet  0   .  traMADol-acetaminophen (ULTRACET) 37.5-325 MG per tablet  Take one tablet by mouth every four hours as needed for pain; Take two tablets by mouth every four hours as needed for severe pain  60 tablet  0    .   Social history; there is very little information available. questionably the patient is from [place].   Family history; not available from any  current source   Review of systems;  Limited secondary to patient does not speak English-she appears to have no complaints --she is not complaining of breast pain today-nursing staff has not noted any complaints of shortness of breath or chest pain or recent back pain   Physical examination  Temperature is 98.2 pulse 84 respirations 16 blood pressure 119/67  In general this is somewhat frail pleasant elderly female in no distress .  Her skin is warm and dry Eyes-pupils appear equal round reactive to light sclera and conjunctiva are clear visual acuity appears grossly intact.  Oropharynx is clear mucous membranes moist Breasts-I did palpate a firm movable area in the right upper outer quadrant on the right breast.  This did not appear to Panzy acutely tender mass I could not appreciate any nodule on the left breast.    Chest is clear to auscultation no labored breathing  Cardiac heart sounds are normal no murmurs no carotid bruits--rate is regular  Abdomen; soft nontender with positive bowel sounds  Muscle skeletal Moves all extremities x 4--is able to ambulate with walker--t.  Psych again she speaks little English was pleasant easily follow verbal commands with prompting   Labs 08/05/2013.  Sodium 141 potassium 3.9 BUN 21 creatinine 0.8.  10/31/2013.  Hemoglobin A1c 5.8.  10/31/2013.  Sodium 140 potassium 3.2 BUN 14 creatinine 0.8.  10/22/2013.  CBC 4.2 hemoglobin 11.5 platelets 214.  Hemoglobin A1c 5.6.  Liver function tests within normal limits except albumin of 3.4 .  Impression/plan  #1 back pain-apparently this is somewhat chronic --current medications appear effective she is not complaining of pain today-and actually had seen her at times walking about the facility a walker apparently with no complaints of pain  #2 type 2 diabetes on metformin --as appears stable CBGs hemoglobin A1c satisfactory  #3iastolically mediated heart failure she is on Lasix 40 mg as well as  Cozaar-most recent lab potassium had normalized--family has requested no further labs clinically she appears stable--of note per discussion with her son this afternoon he is in agreement with pursuing another lab since she is on Lasix and had been on potassium with a history of low potassium we'll update lab s .    #4-history of peripheral neuropathy most likely diabetic she is onNeurontin  #5  History of depression-continues on Prozac #6-history of right breast lesion-- I discussed this with her son her responsible party via phone-they are in agreement with pursuing a workup of this at least initially-will send her for surgical consult and I have notified her  son of this.   [id number]--of note greater than 35 minutes spent assessing patient-reviewing her history-discussing her status with nursing staff as well as with her son via phone-and coordinating a plan of care-of note greater than 50% of time spent coordinating plan of care with family input  .

## 2013-12-17 ENCOUNTER — Other Ambulatory Visit: Payer: Self-pay | Admitting: Internal Medicine

## 2013-12-17 DIAGNOSIS — Z1231 Encounter for screening mammogram for malignant neoplasm of breast: Secondary | ICD-10-CM

## 2013-12-26 ENCOUNTER — Ambulatory Visit
Admission: RE | Admit: 2013-12-26 | Discharge: 2013-12-26 | Disposition: A | Discharge status: Home / Self Care | Payer: Medicare Other | Source: Ambulatory Visit | Attending: Internal Medicine | Admitting: Internal Medicine

## 2013-12-26 ENCOUNTER — Other Ambulatory Visit: Payer: Self-pay | Admitting: Internal Medicine

## 2013-12-26 DIAGNOSIS — R928 Other abnormal and inconclusive findings on diagnostic imaging of breast: Secondary | ICD-10-CM | POA: Diagnosis not present

## 2013-12-26 DIAGNOSIS — N6310 Unspecified lump in the right breast, unspecified quadrant: Secondary | ICD-10-CM

## 2013-12-26 DIAGNOSIS — Z1231 Encounter for screening mammogram for malignant neoplasm of breast: Secondary | ICD-10-CM

## 2013-12-26 DIAGNOSIS — N63 Unspecified lump in unspecified breast: Secondary | ICD-10-CM | POA: Diagnosis not present

## 2014-01-03 ENCOUNTER — Ambulatory Visit (INDEPENDENT_AMBULATORY_CARE_PROVIDER_SITE_OTHER): Payer: Medicare Other | Admitting: Surgery

## 2014-01-06 ENCOUNTER — Non-Acute Institutional Stay (SKILLED_NURSING_FACILITY): Payer: Medicare Other | Admitting: Internal Medicine

## 2014-01-06 ENCOUNTER — Encounter: Payer: Self-pay | Admitting: Internal Medicine

## 2014-01-06 DIAGNOSIS — E119 Type 2 diabetes mellitus without complications: Secondary | ICD-10-CM

## 2014-01-06 DIAGNOSIS — G609 Hereditary and idiopathic neuropathy, unspecified: Secondary | ICD-10-CM

## 2014-01-06 DIAGNOSIS — T148XXA Other injury of unspecified body region, initial encounter: Secondary | ICD-10-CM

## 2014-01-06 DIAGNOSIS — I5032 Chronic diastolic (congestive) heart failure: Secondary | ICD-10-CM | POA: Diagnosis not present

## 2014-01-06 DIAGNOSIS — N63 Unspecified lump in unspecified breast: Secondary | ICD-10-CM

## 2014-01-06 DIAGNOSIS — R42 Dizziness and giddiness: Secondary | ICD-10-CM | POA: Diagnosis not present

## 2014-01-06 NOTE — Progress Notes (Signed)
Patient ID: Margaret [name], female   DOB: Jan 31, 1932, 80 y.o.   MRN: [id number]   This is a routine visit.  Level of care skilled.  Facility AF.   Chief Complaint-- medical management diabetes type 2-hypertension-history CVA-diastolic CHF-physical deconditioning-peripheral neuropathy--acute visit status post fall over the weekend--question dizziness   History of present illness  This is a patient who actually admitted to the facility in early January after a fall at home she suffered a T12 fracture. Workup showed significant osteoarthritis as well as a multiple cerebral infarction syndrome. Apparently she was actually discharged from this facility to home however only to Ruthie readmitted to the hospital 2 days later with fever. She was felt to have helped her quite a pneumonia. Her influenza PCR was negative. She was discharged on Levaquin.  Her original admission to this facility occurred after she was brought in by her granddaughter to the emergency room after a fall at home. The patient apparently felt a sensation of dizziness and then fell backwards she hit her head but did not lose consciousness CT scan of the head showed old lacunar infarcts and small vessel disease. This was followed up with an MRI which showed extensive prior multi-infarct state. Lumbar spine imaging showed a T12 fracture for which neurosurgery was consulted  Patienthas complained of back painf  and was given apparently BenGay this apparently did help some-she has Ultracet when necessary but apparently rarely takes this Apparently she fell over the weekend and did sustain a contusion to her neck area as well as right elbow-apparently nursing initially was given the impression that family said she  complained of dizziness however talking with nursing today apparently family says she did not complain of dizziness before the fall  In regards to hypertension blood pressure appears to Javona well controlled recent blood pressures --132/80-120/74  standing which was taken today-she is on Losartan.  In r to diabetes she continues on Glucophage.-- hemoglobin A1c was satisfactory at 5.8  In regard to CHF she is on Lasix 40 mg a day she is now on potassium--  back in mid May itwas low at 3.2 we supplemented this f and it normalized last updated lab on June 9shows normal potassium is 3.9-family has requested no further labs-she did have nursing readdress this with him stating been on Lasix and potassium it would [place] nice to [place] [facility] on this -- but family apparently is quite adamant that patient does not want any further blood draws  In June patient did complain of some right breast discomfort I evaluated her and found a firm area in the mid breast --follow up was scheduled but patient apparently refused to go for a mammogram  Currently patient has no acute complaints however when asked states she is having some pain of her right arm   \-  .  . .  Past Medical History   Diagnosis  Date   .  Hypertension    .  Diabetes mellitus    .  Gout    .  Past Surgical History   Procedure  Laterality  Date   .  No past surgeries      Current Outpatient Prescriptions on File Prior to Visit   Medication  Sig  Dispense  Refill   .  aspirin 81 MG chewable tablet  Chew 1 tablet (81 mg total) by mouth daily.     .  capsaicin (ZOSTRIX) 0.025 % cream  Apply topically 2 (two) times daily. Apply to legs/calves  and ankles bid  60 g  0   .  FLUoxetine (PROZAC) 10 MG capsule  Take 1 capsule (10 mg total) by mouth at bedtime.  30 capsule  1   .  furosemide (LASIX) 40 MG tablet  Take 40 mg by mouth every morning.     .  gabapentin (NEURONTIN) 300 MG capsule  Take 1 capsule (300 mg total) by mouth at bedtime.  30 capsule  1   .  guaiFENesin (MUCINEX) 600 MG 12 hr tablet  Take 600 mg by mouth 2 (two) times daily as needed for to loosen phlegm.     Marland Kitchen  HYDROcodone-homatropine (HYCODAN) 5-1.5 MG/5ML syrup  Take 5 mLs by mouth every 6 (six) hours as needed for cough.   120 mL  0   .     2  0   .  losartan (COZAAR) 25 MG tablet  Take 25 mg by mouth every morning.     .  metFORMIN (GLUCOPHAGE-XR) 500 MG 24 hr tablet  Take 500 mg by mouth every evening.     .  senna-docusate (SENOKOT-S) 8.6-50 MG per tablet  Take 2 tablets by mouth 2 (two) times daily.  60 tablet  0   .  traMADol-acetaminophen (ULTRACET) 37.5-325 MG per tablet  Take one tablet by mouth every four hours as needed for pain; Take two tablets by mouth every four hours as needed for severe pain  60 tablet  0   Potassium 20 mEq daily .   Social history; there is very little information available. questionably the patient is from [place].  Family history; not available from any current source   Review of systems;  Limited secondary to patient does not speak English-she appears to have some mild  Complaints of right arm pain --she is not complaining of breast pain today-nursing staff has not noted any complaints of shortness of breath or chest pain --  Physical examination  Temperature 97.7 pulse 80 respirations 20 blood pressure 130/80 sitting-120/74 standing  In general this is somewhat frail pleasant elderly female in no distress .  Her skin is warm and dry--she has a violaceous bruise right side of her neck as well as elbow area he does not appear to Leeya acutely painful to palpation there is full range of motion of the arm as well as neck  Eyes-pupils appear equal round reactive to light sclera and conjunctiva are clear visual acuity appears grossly intact.  Oropharynx is clear mucous membranes moist  Breasts-I did palpate a firm movable area in the right upper outer quadrant on the right breast.  This did not appear to Promyse acutely tender or changed from previous exam t.  Chest-slight expiratory wheezing at the bases otherwise clear to auscultation no labored breathing Cardiac heart sounds are normal no murmurs no carotid bruits--rate is regular  Abdomen; soft nontender with positive bowel sounds    Muscle skeletal  Moves all extremities x 4--is able to ambulate with walker-did not note any deformities of her extremities x4 --nor any pain with general range of motion abduction abduction of the hip t.  Psych again she speaks little English was pleasant easily follow verbal commands with prompting   Labs  12/03/2013.  Sodium 141 potassium 3.9 BUN 21 creatinine 0.8.  10/31/2013.  Hemoglobin A1c 5.8.  10/31/2013.  Sodium 140 potassium 3.2 BUN 14 creatinine 0.8.  10/22/2013.  CBC 4.2 hemoglobin 11.5 platelets 214.  Hemoglobin A1c 5.6.  Liver function tests within normal limits except  albumin of 3.4  .  Impression/plan  #Status post fall with contusions right elbow and neck-will order x-rays of the area as a precaution  #2-history CHF-she is on Lasix with potassium supplementation-this is quite challenging with family and patient requesting no further labs-nursing has discussed this with the family but they are quite adamant patient does not want further lab draws at this point we'll continue to monitor clinically she appears stable-  Of note I did attempt to speak with her responsible party her son via phone but he was unavailable however nursing staff later did speak with him about this       #3 type 2 diabetes on metformin -- hemoglobin A1c satisfactory --continue to monitor CBGs which have been quite stable #4-history of right breast lesion-again patient apparently has refused followup-  s .  #5history of peripheral neuropathy most likely diabetic she is onNeurontin  #6  History of depression-continues on Prozac   #7-slight wheeze on lung exam-we'll order a chest x-ray as well monitor vital signs pulse ox every shift for 72 hours.  #8-dizziness-apparently this is quite vague-orthostatics done today were unremarkable --apparently according to son she did not complain of dizziness before her fall l she does not complain of dizziness today  [id number]-of note greater than 35  minutes spent assessing patient-discussing her situation with nursing-and coordinating and formulating a plan of care-numerous diagnoses-of note greater than 50% of time spent coordinating plan of care  .

## 2014-01-07 DIAGNOSIS — R0789 Other chest pain: Secondary | ICD-10-CM | POA: Diagnosis not present

## 2014-01-07 DIAGNOSIS — M25529 Pain in unspecified elbow: Secondary | ICD-10-CM | POA: Diagnosis not present

## 2014-01-07 DIAGNOSIS — M542 Cervicalgia: Secondary | ICD-10-CM | POA: Diagnosis not present

## 2014-01-07 DIAGNOSIS — M25539 Pain in unspecified wrist: Secondary | ICD-10-CM | POA: Diagnosis not present

## 2014-01-08 DIAGNOSIS — R262 Difficulty in walking, not elsewhere classified: Secondary | ICD-10-CM | POA: Diagnosis not present

## 2014-01-08 DIAGNOSIS — I5032 Chronic diastolic (congestive) heart failure: Secondary | ICD-10-CM | POA: Diagnosis not present

## 2014-01-09 DIAGNOSIS — I5032 Chronic diastolic (congestive) heart failure: Secondary | ICD-10-CM | POA: Diagnosis not present

## 2014-01-09 DIAGNOSIS — R262 Difficulty in walking, not elsewhere classified: Secondary | ICD-10-CM | POA: Diagnosis not present

## 2014-01-10 DIAGNOSIS — R262 Difficulty in walking, not elsewhere classified: Secondary | ICD-10-CM | POA: Diagnosis not present

## 2014-01-10 DIAGNOSIS — I5032 Chronic diastolic (congestive) heart failure: Secondary | ICD-10-CM | POA: Diagnosis not present

## 2014-01-11 DIAGNOSIS — R262 Difficulty in walking, not elsewhere classified: Secondary | ICD-10-CM | POA: Diagnosis not present

## 2014-01-11 DIAGNOSIS — I5032 Chronic diastolic (congestive) heart failure: Secondary | ICD-10-CM | POA: Diagnosis not present

## 2014-01-12 ENCOUNTER — Encounter: Payer: Self-pay | Admitting: Internal Medicine

## 2014-01-12 DIAGNOSIS — T148XXA Other injury of unspecified body region, initial encounter: Secondary | ICD-10-CM | POA: Insufficient documentation

## 2014-01-12 DIAGNOSIS — I5032 Chronic diastolic (congestive) heart failure: Secondary | ICD-10-CM | POA: Diagnosis not present

## 2014-01-12 DIAGNOSIS — R42 Dizziness and giddiness: Secondary | ICD-10-CM | POA: Insufficient documentation

## 2014-01-12 DIAGNOSIS — R262 Difficulty in walking, not elsewhere classified: Secondary | ICD-10-CM | POA: Diagnosis not present

## 2014-01-12 NOTE — Progress Notes (Signed)
This encounter was created in error - please disregard.  This encounter was created in error - please disregard.

## 2014-01-13 DIAGNOSIS — I5032 Chronic diastolic (congestive) heart failure: Secondary | ICD-10-CM | POA: Diagnosis not present

## 2014-01-13 DIAGNOSIS — R262 Difficulty in walking, not elsewhere classified: Secondary | ICD-10-CM | POA: Diagnosis not present

## 2014-01-14 DIAGNOSIS — R262 Difficulty in walking, not elsewhere classified: Secondary | ICD-10-CM | POA: Diagnosis not present

## 2014-01-14 DIAGNOSIS — I5032 Chronic diastolic (congestive) heart failure: Secondary | ICD-10-CM | POA: Diagnosis not present

## 2014-01-15 DIAGNOSIS — R262 Difficulty in walking, not elsewhere classified: Secondary | ICD-10-CM | POA: Diagnosis not present

## 2014-01-15 DIAGNOSIS — I5032 Chronic diastolic (congestive) heart failure: Secondary | ICD-10-CM | POA: Diagnosis not present

## 2014-01-16 ENCOUNTER — Inpatient Hospital Stay: Admission: RE | Admit: 2014-01-16 | Payer: Medicare Other | Source: Ambulatory Visit

## 2014-01-16 DIAGNOSIS — R262 Difficulty in walking, not elsewhere classified: Secondary | ICD-10-CM | POA: Diagnosis not present

## 2014-01-16 DIAGNOSIS — I5032 Chronic diastolic (congestive) heart failure: Secondary | ICD-10-CM | POA: Diagnosis not present

## 2014-01-17 DIAGNOSIS — I5032 Chronic diastolic (congestive) heart failure: Secondary | ICD-10-CM | POA: Diagnosis not present

## 2014-01-17 DIAGNOSIS — R262 Difficulty in walking, not elsewhere classified: Secondary | ICD-10-CM | POA: Diagnosis not present

## 2014-01-18 DIAGNOSIS — R262 Difficulty in walking, not elsewhere classified: Secondary | ICD-10-CM | POA: Diagnosis not present

## 2014-01-18 DIAGNOSIS — I5032 Chronic diastolic (congestive) heart failure: Secondary | ICD-10-CM | POA: Diagnosis not present

## 2014-01-19 DIAGNOSIS — R262 Difficulty in walking, not elsewhere classified: Secondary | ICD-10-CM | POA: Diagnosis not present

## 2014-01-19 DIAGNOSIS — I5032 Chronic diastolic (congestive) heart failure: Secondary | ICD-10-CM | POA: Diagnosis not present

## 2014-01-20 ENCOUNTER — Encounter: Payer: Self-pay | Admitting: Internal Medicine

## 2014-01-20 DIAGNOSIS — R262 Difficulty in walking, not elsewhere classified: Secondary | ICD-10-CM | POA: Diagnosis not present

## 2014-01-20 DIAGNOSIS — I5032 Chronic diastolic (congestive) heart failure: Secondary | ICD-10-CM | POA: Diagnosis not present

## 2014-01-22 DIAGNOSIS — I5032 Chronic diastolic (congestive) heart failure: Secondary | ICD-10-CM | POA: Diagnosis not present

## 2014-01-22 DIAGNOSIS — R262 Difficulty in walking, not elsewhere classified: Secondary | ICD-10-CM | POA: Diagnosis not present

## 2014-01-23 DIAGNOSIS — R262 Difficulty in walking, not elsewhere classified: Secondary | ICD-10-CM | POA: Diagnosis not present

## 2014-01-23 DIAGNOSIS — I5032 Chronic diastolic (congestive) heart failure: Secondary | ICD-10-CM | POA: Diagnosis not present

## 2014-01-24 DIAGNOSIS — R262 Difficulty in walking, not elsewhere classified: Secondary | ICD-10-CM | POA: Diagnosis not present

## 2014-01-24 DIAGNOSIS — I5032 Chronic diastolic (congestive) heart failure: Secondary | ICD-10-CM | POA: Diagnosis not present

## 2014-01-25 DIAGNOSIS — R1312 Dysphagia, oropharyngeal phase: Secondary | ICD-10-CM | POA: Diagnosis not present

## 2014-01-25 DIAGNOSIS — R262 Difficulty in walking, not elsewhere classified: Secondary | ICD-10-CM | POA: Diagnosis not present

## 2014-01-27 ENCOUNTER — Non-Acute Institutional Stay (SKILLED_NURSING_FACILITY): Payer: Medicare Other | Admitting: Internal Medicine

## 2014-01-27 DIAGNOSIS — I5032 Chronic diastolic (congestive) heart failure: Secondary | ICD-10-CM | POA: Diagnosis not present

## 2014-01-27 DIAGNOSIS — R059 Cough, unspecified: Secondary | ICD-10-CM | POA: Diagnosis not present

## 2014-01-27 DIAGNOSIS — R05 Cough: Secondary | ICD-10-CM

## 2014-01-27 DIAGNOSIS — I509 Heart failure, unspecified: Secondary | ICD-10-CM | POA: Diagnosis not present

## 2014-01-27 NOTE — Progress Notes (Signed)
Patient ID: Margaret [name], female   DOB: April 19, 1932, 80 y.o.   MRN: [id number]   This is an acute visit.  Level of care skilled.  Facility AF.  Chief complaint-acute visit followup cough.  History of present illness.  Patient is a 80 year old female who family states has been coughing some-she has been apparently getting the Robitussin when necessary but it is not helping much apparently according to family.  Review of systems is somewhat difficult secondary to patient does not speak English-upper nursing staff she appears to Missey at her baseline with no acute shortness of breath or constant coughing.  Family medical social history has been reviewed including progress note on 12/13/2013.  Medications have been reviewed per MAR.  Review of systems again limited secondary to language issues however nursing staff reports she is at her baseline as noted above.  Physical exam.  She is afebrile pulse of 80 respirations of 18 blood pressure recently 137/72.  In general this is a somewhat frail elderly female in no distress lying comfortably in bed.  Her skin is warm and dry.  Chest is clear to auscultation with somewhat poor respiratory effort there is no labored breathing.  Heart is regular rate and rhythm without murmur gallop or rub.  Abdomen is soft nontender with positive bowel sounds.  Extremities I did not really note any significant lower extremity edema.  Labs.  12/03/2013.  Sodium 141 potassium 3.9 BUN 21 creatinine 0.8.  10/31/2013.  Hemoglobin A1c 5.8.  10/22/2013.  WBC 4.2 hemoglobin 11.5 platelets 214.  Liver function tests within normal limits except albumin of 3.4.  Assessment and plan.  #1-cough-Will start Mucinex 600 mg twice a day for 5 days and monitor-also vitals signs pulse ox every shift x72 hours to keep an eye on her.--  Also will order an x-ray if family approves-x-ray done last month did not show any acute process .  #2 history CHF she is on  Lasix with potassium-family has requested no further lab saying patient does not really want any more labs- nonetheless clinically she appears to Marly stable-.  [id number]  Addendum-speech therapy  also evaluated her and feels there may Natallia some  GERD-like symptoms--apparently  burping etc.- which could contribute to cough as well-- we'll start Prilosec 20 mg a day and monitor

## 2014-01-28 DIAGNOSIS — E119 Type 2 diabetes mellitus without complications: Secondary | ICD-10-CM | POA: Diagnosis not present

## 2014-01-28 DIAGNOSIS — I10 Essential (primary) hypertension: Secondary | ICD-10-CM | POA: Diagnosis not present

## 2014-01-28 DIAGNOSIS — D649 Anemia, unspecified: Secondary | ICD-10-CM | POA: Diagnosis not present

## 2014-02-15 ENCOUNTER — Encounter: Payer: Self-pay | Admitting: Internal Medicine

## 2014-02-15 ENCOUNTER — Non-Acute Institutional Stay (SKILLED_NURSING_FACILITY): Payer: Medicare Other | Admitting: Internal Medicine

## 2014-02-15 DIAGNOSIS — I5032 Chronic diastolic (congestive) heart failure: Secondary | ICD-10-CM | POA: Diagnosis not present

## 2014-02-15 DIAGNOSIS — I509 Heart failure, unspecified: Secondary | ICD-10-CM

## 2014-02-15 NOTE — Progress Notes (Signed)
MRN: [id number] Name: Margaret [name]  Sex: female Age: 80 y.o. DOB: [birth year]  [facility]: [id number] Facility/Room: 423 Level Of Care: SNF Provider: Merrilee SeashoreALEXANDER, Itzayana Pardy D Emergency Contacts: Extended Emergency Contact Information Primary Emergency Contact: Lesage,[name] Address: [address]          [place], [place] [address] [place] Home Phone: [phone] Mobile Phone: [phone] Relation: Sister Secondary Emergency Contact: [name],[name] Address: [address]          [place], [place] [address] [place] Home Phone: [phone] Relation: Grandaughter  Code Status: FULL  Allergies: Review of patient's allergies indicates no known allergies.  Chief Complaint  Patient presents with  . Medical Management of Chronic Issues    HPI: Patient is 80 y.o. female whose family is concerned that she has Luby edema.  Past Medical History  Diagnosis Date  . Diabetes mellitus without complication   . Hypertension   . Diabetes mellitus   . Gout   . Chronic diastolic heart failure     Past Surgical History  Procedure Laterality Date  . No past surgeries        Medication List       This list is accurate as of: 02/15/14  9:10 PM.  Always use your most recent med list.               aspirin 81 MG chewable tablet  Chew 1 tablet (81 mg total) by mouth daily.     capsaicin 0.025 % cream  Commonly known as:  ZOSTRIX  Apply topically 2 (two) times daily. Apply to legs/calves and ankles bid     FLUoxetine 10 MG capsule  Commonly known as:  PROZAC  Take 1 capsule (10 mg total) by mouth at bedtime.     furosemide 40 MG tablet  Commonly known as:  LASIX  Take 40 mg by mouth every morning.     gabapentin 300 MG capsule  Commonly known as:  NEURONTIN  Take 1 capsule (300 mg total) by mouth at bedtime.     guaiFENesin 600 MG 12 hr tablet  Commonly known as:  MUCINEX  Take 600 mg by mouth 2 (two) times daily as needed for to loosen phlegm.     HYDROcodone-homatropine 5-1.5 MG/5ML syrup  Commonly known as:  HYCODAN  Take 5 mLs by mouth every 6 (six) hours as needed for cough.     losartan 25 MG tablet  Commonly known as:  COZAAR  Take 25 mg by mouth every morning.     metFORMIN 500 MG 24 hr tablet  Commonly known as:  GLUCOPHAGE-XR  Take 500 mg by mouth every evening.     potassium chloride 20 MEQ packet  Commonly known as:  KLOR-CON  Take 20 mEq by mouth daily.     senna-docusate 8.6-50 MG per tablet  Commonly known as:  Senokot-S  Take 2 tablets by mouth 2 (two) times daily.     traMADol-acetaminophen 37.5-325 MG per tablet  Commonly known as:  ULTRACET  Take one tablet by mouth every four hours as needed for pain; Take two tablets by mouth every four hours as needed for severe pain        No orders of the defined types were placed in this encounter.    Immunization History  Administered Date(s) Administered  . Pneumococcal Polysaccharide-23 06/23/2013    History  Substance Use Topics  . Smoking status: Former Games developermoker  . Smokeless tobacco: Not on  file  . Alcohol Use: No    Review of Systems  DATA OBTAINED: from patient, nurse GENERAL: Feels well no fevers, fatigue, appetite changes SKIN: No itching, rash HEENT: No complaint RESPIRATORY: No cough, wheezing, SOB CARDIAC: No chest pain, palpitations, c/o lower extremity edema  GI: No abdominal pain, No N/V/D or constipation, No heartburn or reflux  GU: No dysuria, frequency or urgency, or incontinence  MUSCULOSKELETAL: No unrelieved bone/joint pain NEUROLOGIC: No headache, dizziness or focal weakness PSYCHIATRIC: No overt anxiety or sadness. Sleeps well.   Filed Vitals:   02/05/14 2057  BP: 127/69  Pulse: 62  Temp: 97 F (36.1 C)  Resp: 16    Physical Exam  GENERAL APPEARANCE: Alert, modconversant. Appropriately groomed. No acute distress  SKIN: No diaphoresis rash HEENT: Unremarkable RESPIRATORY: Breathing is even, unlabored. Lung sounds  are clear, no rales   CARDIOVASCULAR: Heart RRR no murmurs, rubs or gallops. NO peripheral edema  GASTROINTESTINAL: Abdomen is soft, non-tender, not distended w/ normal bowel sounds.  GENITOURINARY: Bladder non tender, not distended  MUSCULOSKELETAL: No abnormal joints or musculature NEUROLOGIC: Cranial nerves 2-12 grossly intact. Moves all extremities no tremor. PSYCHIATRIC: Mood and affect appropriate to situation, no behavioral issues  Patient Active Problem List   Diagnosis Date Noted  . Cough 01/27/2014  . Dizziness 01/12/2014  . Contusion of unspecified site 01/12/2014  . Lump in female breast 12/13/2013  . Lumbago 10/23/2013  . Type II or unspecified type diabetes mellitus without mention of complication, not stated as uncontrolled 10/18/2013  . Unspecified hereditary and idiopathic peripheral neuropathy 10/18/2013  . Unspecified constipation 10/18/2013  . Plantar fasciitis, left 07/29/2013  . Physical deconditioning 07/21/2013  . Diabetes mellitus, controlled 07/19/2013  . Chronic diastolic CHF (congestive heart failure) 07/19/2013  . HCAP (healthcare-associated pneumonia) 07/18/2013  . Chronic diastolic heart failure 06/24/2013  . Syncope 06/21/2013  . Syncope and collapse 06/21/2013  . Leukocytosis 06/21/2013  . Hypertension 06/21/2013  . Diabetes mellitus due to underlying condition 06/21/2013    CBC    Component Value Date/Time   WBC 4.7 07/22/2013 0420   RBC 3.56* 07/22/2013 0420   HGB 10.4* 07/22/2013 0420   HCT 31.2* 07/22/2013 0420   PLT 222 07/22/2013 0420   MCV 87.6 07/22/2013 0420   LYMPHSABS 1.1 07/18/2013 1910   MONOABS 0.9 07/18/2013 1910   EOSABS 0.1 07/18/2013 1910   BASOSABS 0.0 07/18/2013 1910    CMP     Component Value Date/Time   NA 137 07/22/2013 0420   K 3.4* 07/22/2013 0420   CL 97 07/22/2013 0420   CO2 29 07/22/2013 0420   GLUCOSE 98 07/22/2013 0420   BUN 10 07/22/2013 0420   CREATININE 0.63 07/22/2013 0420   CALCIUM 8.7 07/22/2013 0420   PROT  7.4 07/18/2013 1910   ALBUMIN 3.1* 07/18/2013 1910   AST 13 07/18/2013 1910   ALT 9 07/18/2013 1910   ALKPHOS 84 07/18/2013 1910   BILITOT 0.8 07/18/2013 1910   GFRNONAA 83* 07/22/2013 0420   GFRAA >90 07/22/2013 0420    Assessment and Plan PEDAL EDEMA/ Chronic diastolic CHF (congestive heart failure) Pt c/o edema in her legs but there is really no edema. I spoke with the nurse so she could tell the family that I had seen the pt and she was fine.    [name], MD  Date of Visit 02/05/2014

## 2014-02-15 NOTE — Assessment & Plan Note (Signed)
Pt c/o edema in her legs but there is really no edema. I spoke with the nurse so she could tell the family that I had seen the pt and she was fine.

## 2014-02-25 DIAGNOSIS — H43399 Other vitreous opacities, unspecified eye: Secondary | ICD-10-CM | POA: Diagnosis not present

## 2014-02-25 DIAGNOSIS — H26499 Other secondary cataract, unspecified eye: Secondary | ICD-10-CM | POA: Diagnosis not present

## 2014-02-25 DIAGNOSIS — E119 Type 2 diabetes mellitus without complications: Secondary | ICD-10-CM | POA: Diagnosis not present

## 2014-03-18 DIAGNOSIS — B351 Tinea unguium: Secondary | ICD-10-CM | POA: Diagnosis not present

## 2014-03-18 DIAGNOSIS — E1149 Type 2 diabetes mellitus with other diabetic neurological complication: Secondary | ICD-10-CM | POA: Diagnosis not present

## 2014-04-04 ENCOUNTER — Other Ambulatory Visit: Payer: Self-pay | Admitting: *Deleted

## 2014-04-04 MED ORDER — TRAMADOL-ACETAMINOPHEN 37.5-325 MG PO TABS: ORAL_TABLET | ORAL | Status: DC

## 2014-04-04 NOTE — Telephone Encounter (Signed)
[facility] 

## 2014-04-09 ENCOUNTER — Non-Acute Institutional Stay (SKILLED_NURSING_FACILITY): Payer: Medicare Other | Admitting: Internal Medicine

## 2014-04-09 DIAGNOSIS — R059 Cough, unspecified: Secondary | ICD-10-CM

## 2014-04-09 DIAGNOSIS — I10 Essential (primary) hypertension: Secondary | ICD-10-CM

## 2014-04-09 DIAGNOSIS — E119 Type 2 diabetes mellitus without complications: Secondary | ICD-10-CM | POA: Diagnosis not present

## 2014-04-09 DIAGNOSIS — G609 Hereditary and idiopathic neuropathy, unspecified: Secondary | ICD-10-CM

## 2014-04-09 DIAGNOSIS — R05 Cough: Secondary | ICD-10-CM | POA: Diagnosis not present

## 2014-04-09 DIAGNOSIS — I5032 Chronic diastolic (congestive) heart failure: Secondary | ICD-10-CM | POA: Diagnosis not present

## 2014-04-09 NOTE — Progress Notes (Signed)
Patient ID: Margaret [name], female   DOB: 04/21/1932, 80 y.o.   MRN: [id number]   This is a routine visit.  Level of care skilled.  Facility AF .  Chief Complaint-- medical management diabetes type 2-hypertension-history CVA-diastolic CHF-physical deconditioning-peripheral neuropathy-  History of present illness  This is a patient who actually admitted to the facility in early January after a fall at home she suffered a T12 fracture. Workup showed significant osteoarthritis as well as a multiple cerebral infarction syndrome. Apparently she was actually discharged from this facility to home however only to [place] readmitted to the hospital 2 days later with fever. She was felt to have helped her quite a pneumonia. Her influenza PCR was negative. She was discharged on Levaquin.  Her original admission to this facility occurred after she was brought in by her granddaughter to the emergency room after a fall at home. The patient apparently felt a sensation of dizziness and then fell backwards she hit her head but did not lose consciousness CT scan of the head showed old lacunar infarcts and small vessel disease. This was followed up with an MRI which showed extensive prior multi-infarct state. Lumbar spine imaging showed a T12 fracture for which neurosurgery was consulted  Patienthas complained of back painf and was given apparently BenGay this apparently did help some-she has Ultramt when necessary but apparently rarely takes this    In regards to hypertension blood pressure appears to Fonda well controlled recent blood pressures --124/72-127/60-she is on Losartan.  In r to diabetes she continues on Glucophage.-- hemoglobin A1c was recently satisfactory at 6.0  In regard to CHF she is on Lasix 40 mg a day she is now on potassium-- back in mid May itwas low at 3.2 we supplemented this f and it normalized last updated lab on June 9shows normal potassium is 3.9-family has requested no further labs-she did have nursing  readdress this with him stating been on Lasix and potassium it would [place] --and they did allow this labs done back in August showed a normal potassium of 4.2 renal function appeared to Krishauna stable with BUN of 16 creatinine of 0.8-would like to update this  if patient's family is okay with it-- In June patient did complain of some right breast discomfort I evaluated her and found a firm area in the mid breast --follow up was scheduled but patient apparently refused to go for a mammogram  Currently patient has no acute complaints however nursing staff has noted an occasional cough this weekend \-  .  . .  Past Medical History   Diagnosis  Date   .  Hypertension    .  Diabetes mellitus    .  Gout    .  Past Surgical History   Procedure  Laterality  Date   .  No past surgeries      Current Outpatient Prescriptions on File Prior to Visit   Medication  Sig  Dispense  Refill   .  aspirin 81 MG chewable tablet  Chew 1 tablet (81 mg total) by mouth daily.     .  capsaicin (ZOSTRIX) 0.025 % cream  Apply topically 2 (two) times daily. Apply to legs/calves and ankles bid  60 g  0   .  FLUoxetine (PROZAC) 10 MG capsule  Take 1 capsule (10 mg total) by mouth at bedtime.  30 capsule  1   .  furosemide (LASIX) 40 MG tablet  Take 40  mg by mouth every morning.     .  gabapentin (NEURONTIN) 300 MG capsule  Take 1 capsule (300 mg total) by mouth at bedtime.  30 capsule  1   .  guaiFENesin (MUCINEX) 600 MG 12 hr tablet  Take 600 mg by mouth 2 (two) times daily as needed for to loosen phlegm.     Marland Kitchen.  HYDROcodone-homatropine (HYCODAN) 5-1.5 MG/5ML syrup  Take 5 mLs by mouth every 6 (six) hours as needed for cough.  120 mL  0   .     2  0   .  losartan (COZAAR) 25 MG tablet  Take 25 mg by mouth every morning.     .  metFORMIN (GLUCOPHAGE-XR) 500 MG 24 hr tablet  Take 500 mg by mouth every evening.     .  senna-docusate (SENOKOT-S) 8.6-50 MG per tablet  Take 2 tablets by mouth 2 (two) times  daily.  60 tablet  0   .  traMADol-acetaminophen (ULTRACET) 37.5-325 MG per tablet  Take one tablet by mouth every four hours as needed for pain; Take two tablets by mouth every four hours as needed for severe pain  60 tablet  0   Potassium 20 mEq daily  .   Social history; there is very little information available. questionably the patient is from [place].   Family history; not available from any current source   Review of systems;  Limited secondary to patient does not speak English-but she appears to have no complaints -- apparently nursing staff has noted a cough over the weekend  --  Physical examination  Temperature 97.1 pulse 74 respirations 20 blood pressure 124/72  In general this is somewhat frail pleasant elderly female in no distress .  Her skin is warm and dry-   Eyes-pupils appear equal round reactive to light sclera and conjunctiva are clear visual acuity appears grossly intact.   Oropharynx is clear mucous membranes moist   t.  Chest-clear to auscultation no labored breathing  Cardiac heart sounds are normal no murmurs no carotid bruits--rate is regular  Abdomen; soft nontender with positive bowel sounds  Muscle skeletal  Moves all extremities x 4--is able to ambulate with walker-did not note any deformities of her extremities x4 --n.  Psych again she speaks little English was pleasant easily follow verbal commands with prompting   Labs 01/25/2014.  WBC 6.8 hemoglobin 12.1 platelets 223.  Sodium 139 potassium 4.2 BUN 16 creatinine 0.87.  Hemoglobin A1c 6.0.  Liver function tests within normal limits  12/03/2013.  Sodium 141 potassium 3.9 BUN 21 creatinine 0.8.  10/31/2013.  Hemoglobin A1c 5.8.  10/31/2013.  Sodium 140 potassium 3.2 BUN 14 creatinine 0.8.  10/22/2013.  CBC 4.2 hemoglobin 11.5 platelets 214.  Hemoglobin A1c 5.6.  Liver function tests within normal limits except albumin of 3.4  .  Impression/plan  ##1-cough physical exam is quite  benign will order Mucinex 600 mg twice a day for 5 days and monitor with vital signs pulse ox every shift for 72 hours  #2-history CHF-she is on Lasix with potassium supplementation--update BMP if family is okay with this   #3 type 2 diabetes on metformin -- hemoglobin A1c satisfactory --continue to monitor CBGs which have been quite stable   #4-history of right breast lesion-again patient apparently has refused followup-  s .  #5history of peripheral neuropathy most likely diabetic she is onNeurontin  #6  History of depression-continues on Prozac   [phone]    .

## 2014-04-11 DIAGNOSIS — I5032 Chronic diastolic (congestive) heart failure: Secondary | ICD-10-CM | POA: Diagnosis not present

## 2014-04-14 ENCOUNTER — Encounter: Payer: Self-pay | Admitting: Internal Medicine

## 2014-04-24 ENCOUNTER — Non-Acute Institutional Stay (SKILLED_NURSING_FACILITY): Payer: Medicare Other | Admitting: Internal Medicine

## 2014-04-24 DIAGNOSIS — R609 Edema, unspecified: Secondary | ICD-10-CM

## 2014-04-24 DIAGNOSIS — M25473 Effusion, unspecified ankle: Secondary | ICD-10-CM

## 2014-04-24 NOTE — Progress Notes (Signed)
Patient ID: Margaret [name], female   DOB: 1931/08/26, 80 y.o.   MRN: [id number]   This is a routine visit.  Level of care skilled.  Facility AF  .  Chief Complaint--acute visit secondary to left ankle discomfort-slight edema   History of present illness   Patient is a pleasant 80 year old female who has been a long-term resident here-apparently last day or so nursing staff has noted some mild edema of her left ankle area-also apparently somewhat more painl although she continues to ambulate about the facility at her baseline.  Patient does not speak English so review of systems is limited but essentially she is at her baseline per nursing staff they have just noted some mild edema here and appears to Margaret [name] somewhat tender at times.  There's been no history of falls or trauma recently to my knowledge     \-  .  . .  Past Medical History   Diagnosis  Date   .  Hypertension    .  Diabetes mellitus    .  Gout    .  Past Surgical History   Procedure  Laterality  Date   .  No past surgeries      Current Outpatient Prescriptions on File Prior to Visit   Medication  Sig  Dispense  Refill   .  aspirin 81 MG chewable tablet  Chew 1 tablet (81 mg total) by mouth daily.     .  capsaicin (ZOSTRIX) 0.025 % cream  Apply topically 2 (two) times daily. Apply to legs/calves and ankles bid  60 g  0   .  FLUoxetine (PROZAC) 10 MG capsule  Take 1 capsule (10 mg total) by mouth at bedtime.  30 capsule  1   .  furosemide (LASIX) 40 MG tablet  Take 40 mg by mouth every morning.     .  gabapentin (NEURONTIN) 300 MG capsule  Take 1 capsule (300 mg total) by mouth at bedtime.  30 capsule  1   .  guaiFENesin (MUCINEX) 600 MG 12 hr tablet  Take 600 mg by mouth 2 (two) times daily as needed for to loosen phlegm.     Marland Kitchen.  HYDROcodone-homatropine (HYCODAN) 5-1.5 MG/5ML syrup  Take 5 mLs by mouth every 6 (six) hours as needed for cough.  120 mL  0   .     2  0   .  losartan (COZAAR) 25 MG tablet  Take 25 mg by mouth  every morning.     .  metFORMIN (GLUCOPHAGE-XR) 500 MG 24 hr tablet  Take 500 mg by mouth every evening.     .  senna-docusate (SENOKOT-S) 8.6-50 MG per tablet  Take 2 tablets by mouth 2 (two) times daily.  60 tablet  0   .  traMADol-acetaminophen (ULTRACET) 37.5-325 MG per tablet  Take one tablet by mouth every four hours as needed for pain; Take two tablets by mouth every four hours as needed for severe pain  60 tablet  0   Potassium 20 mEq daily  .   Social history; there is very little information available. questionably the patient is from [place].  Family history; not available from any current source  Review of systems;  Limited secondary to patient does not speak English-but she appears to have no complaints --  apparently some tenderness when left ankle area is palpated --  Physical examination    In general this is somewhat frail pleasant elderly female in no distress .  Her skin is warm and dry-  Eyes-pupils appear equal round reactive to light sclera and conjunctiva are clear visual acuity appears grossly intact.  t.  Chest-clear to auscultation no labored breathing  Cardiac heart sounds are normal no murmurs no carotid bruits--rate is regular    Muscle skeletal  Moves all extremities x 4--is able to ambulate with walker-did not note any deformities of her extremities x4 ---this is a limited exam since she is in bed this evening but I do notet some mild edema slight tenderness to her left foot ankle area-I did not note any deformity there-- does not appear to Kathrene acute pain with flexion and extension at the ankle. \  Pedal pulse appear somewhat reduced Psych again she speaks little English was pleasant easily follow verbal commands with prompting    Labs   01/25/2014.  WBC 6.8 hemoglobin 12.1 platelets 223.  Sodium 139 potassium 4.2 BUN 16 creatinine 0.87.  Hemoglobin A1c 6.0.  Liver function tests within normal limits  12/03/2013.  Sodium 141 potassium 3.9 BUN 21  creatinine 0.8.  10/31/2013.  Hemoglobin A1c 5.8.  10/31/2013.  Sodium 140 potassium 3.2 BUN 14 creatinine 0.8.  10/22/2013.  CBC 4.2 hemoglobin 11.5 platelets 214.  Hemoglobin A1c 5.6.  Liver function tests within normal limits except albumin of 3.4  .  Impression/plan  ##1-left ankle edema-some tenderness to palpation-we'll order venous Doppler rule out DVT-also will order an x-ray of the area-try to make  patient nonweightbearing until x-ray results are finalized although this may Margaret [name] somewhat of a challenge with patient wanting to ambulate  #2-history CHF-she is on Lasix with potassium supplementation--update BMP if family is okay with this--when I saw her earlier this month order was written as well family may not desire this but will write an order to see if this can Margaret [name] readdressed     [id number]

## 2014-04-25 DIAGNOSIS — S9000XA Contusion of unspecified ankle, initial encounter: Secondary | ICD-10-CM | POA: Diagnosis not present

## 2014-04-25 DIAGNOSIS — S8010XA Contusion of unspecified lower leg, initial encounter: Secondary | ICD-10-CM | POA: Diagnosis not present

## 2014-04-25 DIAGNOSIS — M25572 Pain in left ankle and joints of left foot: Secondary | ICD-10-CM | POA: Diagnosis not present

## 2014-04-25 DIAGNOSIS — M79662 Pain in left lower leg: Secondary | ICD-10-CM | POA: Diagnosis not present

## 2014-07-15 DIAGNOSIS — Z9181 History of falling: Secondary | ICD-10-CM | POA: Diagnosis not present

## 2014-07-15 DIAGNOSIS — R262 Difficulty in walking, not elsewhere classified: Secondary | ICD-10-CM | POA: Diagnosis not present

## 2014-07-16 DIAGNOSIS — R262 Difficulty in walking, not elsewhere classified: Secondary | ICD-10-CM | POA: Diagnosis not present

## 2014-07-16 DIAGNOSIS — Z9181 History of falling: Secondary | ICD-10-CM | POA: Diagnosis not present

## 2014-07-17 DIAGNOSIS — Z9181 History of falling: Secondary | ICD-10-CM | POA: Diagnosis not present

## 2014-07-17 DIAGNOSIS — R262 Difficulty in walking, not elsewhere classified: Secondary | ICD-10-CM | POA: Diagnosis not present

## 2014-07-18 DIAGNOSIS — R262 Difficulty in walking, not elsewhere classified: Secondary | ICD-10-CM | POA: Diagnosis not present

## 2014-07-18 DIAGNOSIS — Z9181 History of falling: Secondary | ICD-10-CM | POA: Diagnosis not present

## 2014-07-21 DIAGNOSIS — Z9181 History of falling: Secondary | ICD-10-CM | POA: Diagnosis not present

## 2014-07-21 DIAGNOSIS — R262 Difficulty in walking, not elsewhere classified: Secondary | ICD-10-CM | POA: Diagnosis not present

## 2014-07-22 DIAGNOSIS — R262 Difficulty in walking, not elsewhere classified: Secondary | ICD-10-CM | POA: Diagnosis not present

## 2014-07-22 DIAGNOSIS — Z9181 History of falling: Secondary | ICD-10-CM | POA: Diagnosis not present

## 2014-07-23 DIAGNOSIS — Z9181 History of falling: Secondary | ICD-10-CM | POA: Diagnosis not present

## 2014-07-23 DIAGNOSIS — R262 Difficulty in walking, not elsewhere classified: Secondary | ICD-10-CM | POA: Diagnosis not present

## 2014-07-24 DIAGNOSIS — R262 Difficulty in walking, not elsewhere classified: Secondary | ICD-10-CM | POA: Diagnosis not present

## 2014-07-24 DIAGNOSIS — Z9181 History of falling: Secondary | ICD-10-CM | POA: Diagnosis not present

## 2014-07-25 DIAGNOSIS — R262 Difficulty in walking, not elsewhere classified: Secondary | ICD-10-CM | POA: Diagnosis not present

## 2014-07-25 DIAGNOSIS — Z9181 History of falling: Secondary | ICD-10-CM | POA: Diagnosis not present

## 2014-07-26 DIAGNOSIS — R262 Difficulty in walking, not elsewhere classified: Secondary | ICD-10-CM | POA: Diagnosis not present

## 2014-07-26 DIAGNOSIS — Z9181 History of falling: Secondary | ICD-10-CM | POA: Diagnosis not present

## 2014-07-28 DIAGNOSIS — R262 Difficulty in walking, not elsewhere classified: Secondary | ICD-10-CM | POA: Diagnosis not present

## 2014-07-28 DIAGNOSIS — Z9181 History of falling: Secondary | ICD-10-CM | POA: Diagnosis not present

## 2014-07-29 DIAGNOSIS — Z9181 History of falling: Secondary | ICD-10-CM | POA: Diagnosis not present

## 2014-07-29 DIAGNOSIS — R262 Difficulty in walking, not elsewhere classified: Secondary | ICD-10-CM | POA: Diagnosis not present

## 2014-07-30 DIAGNOSIS — R262 Difficulty in walking, not elsewhere classified: Secondary | ICD-10-CM | POA: Diagnosis not present

## 2014-07-30 DIAGNOSIS — Z9181 History of falling: Secondary | ICD-10-CM | POA: Diagnosis not present

## 2014-07-31 ENCOUNTER — Non-Acute Institutional Stay (SKILLED_NURSING_FACILITY): Payer: Medicare Other | Admitting: Internal Medicine

## 2014-07-31 DIAGNOSIS — R062 Wheezing: Secondary | ICD-10-CM | POA: Diagnosis not present

## 2014-07-31 DIAGNOSIS — I5032 Chronic diastolic (congestive) heart failure: Secondary | ICD-10-CM | POA: Diagnosis not present

## 2014-07-31 DIAGNOSIS — E119 Type 2 diabetes mellitus without complications: Secondary | ICD-10-CM

## 2014-07-31 DIAGNOSIS — I10 Essential (primary) hypertension: Secondary | ICD-10-CM

## 2014-07-31 DIAGNOSIS — Z9181 History of falling: Secondary | ICD-10-CM | POA: Diagnosis not present

## 2014-07-31 DIAGNOSIS — R262 Difficulty in walking, not elsewhere classified: Secondary | ICD-10-CM | POA: Diagnosis not present

## 2014-07-31 NOTE — Progress Notes (Signed)
Patient ID: Margaret [name], female   DOB: 11-30-1931, 81 y.o.   MRN: [id number]   his is a routine visit.  Level of care skilled.  Facility AF .  Chief Complaint-- medical management diabetes type 2-hypertension-history CVA-diastolic CHF-physical deconditioning-peripheral neuropathy-  History of present illness  This is a patient who actually admitted to the facility last year after a fall at home she suffered a T12 fracture. Workup showed significant osteoarthritis as well as a multiple cerebral infarction syndrome. Apparently she was actually discharged from this facility to home however only to [place] readmitted to the hospital 2 days later with fever. She was felt to have had pneumonia. Her influenza PCR was negative. She was discharged on Levaquin.  Her original admission to this facility occurred after she was brought in by her granddaughter to the emergency room after a fall at home. The patient apparently felt a sensation of dizziness and then fell backwards she hit her head but did not lose consciousness CT scan of the head showed old lacunar infarcts and small vessel disease. This was followed up with an MRI which showed extensive prior multi-infarct state. Lumbar spine imaging shoewd a T12 fracture for which neurosurgery was consulted  Patienthas complained of back painf and was given apparently BenGay this apparently did help some-she has Ultramt when necessary but apparently rarely takes this    In regards to hypertension I took it manually and got 138/82 I see some systolics in the 140s.  In r to diabetes she continues on Glucophage.-- Family wants minimal interventions we've been monitoring a hemoglobin A1c most recently was 6 this will need to Shelena updated her fasting glucose most recently was 120 In regard to CHF she is on Lasix 40 mg a day she is now on potassium-- back in mid May itwas low at 3.2 we supplemented this f and it normalized last updated l lab shows normalization-family has  requested no further labs-she did have nursing readdress this with him stating been on Lasix and potassium it would Io nice to [place] eye on this --and they did allow this labs done back in October which showed a normal potassium 3.7 sodium 141 BUN 14 creatinine 0.8-hemoglobin was 11.0-would like to update this if family approved -- In June patient did complain of some right breast discomfort I evaluated her and found a firm area in the mid breast --follow up was scheduled but patient apparently refused to go for a mammogram  Currently patient has no acute complaints however nursing staff apparently noted some wheezing during exercise recently-tonight she appears to Arminta resting topically no signs of shortness of breath no chest congestion \-  .  . .  Past Medical History   Diagnosis  Date   .  Hypertension    .  Diabetes mellitus    .  Gout    .  Past Surgical History   Procedure  Laterality  Date   .  No past surgeries      Current Outpatient Prescriptions on File Prior to Visit   Medication  Sig  Dispense  Refill   .  aspirin 81 MG chewable tablet  Chew 1 tablet (81 mg total) by mouth daily.     .  capsaicin (ZOSTRIX) 0.025 % cream  Apply topically 2 (two) times daily. Apply to legs/calves and ankles bid  60 g  0   .  FLUoxetine (PROZAC) 10 MG capsule  Take 1 capsule (10 mg total) by mouth at bedtime.  30 capsule  1   .  furosemide (LASIX) 40 MG tablet  Take 40 mg by mouth every morning.     .  gabapentin (NEURONTIN) 300 MG capsule  Take 1 capsule (300 mg total) by mouth at bedtime.  30 capsule  1   .  guaiFENesin (MUCINEX) 600 MG 12 hr tablet  Take 600 mg by mouth 2 (two) times daily as needed for to loosen phlegm.     Marland Kitchen.  HYDROcodone-homatropine (HYCODAN) 5-1.5 MG/5ML syrup  Take 5 mLs by mouth every 6 (six) hours as needed for cough.  120 mL  0   .     2  0   .  losartan (COZAAR) 25 MG tablet  Take 25 mg by mouth every morning.     .  metFORMIN (GLUCOPHAGE-XR) 500 MG 24 hr tablet   Take 500 mg by mouth every evening.     .  senna-docusate (SENOKOT-S) 8.6-50 MG per tablet  Take 2 tablets by mouth 2 (two) times daily.  60 tablet  0   .  traMADol-acetaminophen (ULTRACET) 37.5-325 MG per tablet  Take one tablet by mouth every four hours as needed for pain; Take two tablets by mouth every four hours as needed for severe pain  60 tablet  0   Potassium 20 mEq daily  .   Social history; there is very little information available. questionably the patient is from [place].   Family history; not available from any current source   Review of systems;  Limited secondary to patient does not speak English-but she appears to have no complaints -- apparently she recently was noted to have a slight wheeze  --  Physical examination she is afebrile pulse is 62 respirations 18 blood pressure 138/82  In general this is somewhat frail pleasant elderly female in no distress .  Her skin is warm and dry-    .   Oropharynx is clear mucous membranes moist   t.  Chest-clear to auscultation no labored breathing  Cardiac heart sounds are normal no murmurs no carotid bruits--rate is regular--quite minimal edema  Abdomen; soft nontender with positive bowel sounds  Muscle skeletal  Moves all extremities x 4--is able to ambulate with walker-did not note any deformities of her extremities x4 --.  Psych again she speaks little English was pleasant easily follow verbal commands with prompting   Labs  04/11/2014.  Sodium 141 potassium 3.7 BUN 14 creatinine 0.8.  WBC 4.0 hemoglobin 11.0 platelets 197.   01/25/2014.  WBC 6.8 hemoglobin 12.1 platelets 223.  Sodium 139 potassium 4.2 BUN 16 creatinine 0.87.  Hemoglobin A1c 6.0.  Liver function tests within normal limits  12/03/2013.  Sodium 141 potassium 3.9 BUN 21 creatinine 0.8.  10/31/2013.  Hemoglobin A1c 5.8.  10/31/2013.  Sodium 140 potassium 3.2 BUN 14 creatinine 0.8.  10/22/2013.  CBC 4.2 hemoglobin 11.5 platelets 214.    Hemoglobin A1c 5.6.  Liver function tests within normal limits except albumin of 3.4  .  Impression/plan  ##1- Questionable intermittent wheezing-did not appreciate this on exam this evening at this point monitor vital signs pulse ox every shift for 48 hours to keep an eye on this monitor for any cough or congestion #2-history CHF-she is on Lasix with potassium supplementation--update BMP if family is okay with this   #3 type 2 diabetes on metformin -- check hemoglobin A1c next lab day pending family's approval   #4-history of right breast lesion-again patient apparently has refused followup-  s .  #  5history of peripheral neuropathy most likely diabetic she is onNeurontin  #6  History of depression-continues on Prozac   Also would like update a CBC a family approves  [phone]    .

## 2014-08-01 DIAGNOSIS — E08319 Diabetes mellitus due to underlying condition with unspecified diabetic retinopathy without macular edema: Secondary | ICD-10-CM | POA: Diagnosis not present

## 2014-08-01 DIAGNOSIS — R262 Difficulty in walking, not elsewhere classified: Secondary | ICD-10-CM | POA: Diagnosis not present

## 2014-08-01 DIAGNOSIS — I10 Essential (primary) hypertension: Secondary | ICD-10-CM | POA: Diagnosis not present

## 2014-08-01 DIAGNOSIS — Z9181 History of falling: Secondary | ICD-10-CM | POA: Diagnosis not present

## 2014-08-01 LAB — HEMOGLOBIN A1C: Hgb A1c MFr Bld: 6 % (ref 4.0–6.0)

## 2014-08-02 DIAGNOSIS — Z9181 History of falling: Secondary | ICD-10-CM | POA: Diagnosis not present

## 2014-08-02 DIAGNOSIS — R262 Difficulty in walking, not elsewhere classified: Secondary | ICD-10-CM | POA: Diagnosis not present

## 2014-08-03 ENCOUNTER — Encounter: Payer: Self-pay | Admitting: Internal Medicine

## 2014-08-04 DIAGNOSIS — Z9181 History of falling: Secondary | ICD-10-CM | POA: Diagnosis not present

## 2014-08-04 DIAGNOSIS — R262 Difficulty in walking, not elsewhere classified: Secondary | ICD-10-CM | POA: Diagnosis not present

## 2014-08-05 DIAGNOSIS — Z9181 History of falling: Secondary | ICD-10-CM | POA: Diagnosis not present

## 2014-08-05 DIAGNOSIS — R262 Difficulty in walking, not elsewhere classified: Secondary | ICD-10-CM | POA: Diagnosis not present

## 2014-08-21 ENCOUNTER — Non-Acute Institutional Stay (SKILLED_NURSING_FACILITY): Payer: Medicare Other | Admitting: Internal Medicine

## 2014-08-21 DIAGNOSIS — M25561 Pain in right knee: Secondary | ICD-10-CM | POA: Diagnosis not present

## 2014-08-21 DIAGNOSIS — M79661 Pain in right lower leg: Secondary | ICD-10-CM | POA: Diagnosis not present

## 2014-08-21 DIAGNOSIS — M25569 Pain in unspecified knee: Secondary | ICD-10-CM | POA: Diagnosis not present

## 2014-08-21 DIAGNOSIS — R609 Edema, unspecified: Secondary | ICD-10-CM | POA: Diagnosis not present

## 2014-08-21 DIAGNOSIS — M25562 Pain in left knee: Secondary | ICD-10-CM | POA: Diagnosis not present

## 2014-08-21 DIAGNOSIS — M79662 Pain in left lower leg: Secondary | ICD-10-CM | POA: Diagnosis not present

## 2014-08-21 NOTE — Progress Notes (Signed)
Patient ID: Margaret [name], female   DOB: 03-Nov-1931, 81 y.o.   MRN: [id number]   Patient ID: Margaret [name], female   DOB: 03-Nov-1931, 81 y.o.   MRN: [id number]    This is an acute visit Level of care skilled.  Facility AF .  Chief Complaint--  -  History of present illness  This is a patient who actually admitted to the facility last year after a fall at home she suffered a T12 fracture. Workup showed significant osteoarthritis as well as a multiple cerebral infarction syndrome.  .  Her original admission to this facility occurred after she was brought in by her granddaughter to the emergency room after a fall at home. The patient apparently felt a sensation of dizziness and then fell backwards she hit her head but did not lose consciousness CT scan of the head showed old lacunar infarcts and small vessel disease. This was followed up with an MRI which showed extensive prior multi-infarct state. Lumbar spine imaging shoewd a T12 fracture for which neurosurgery was consulted  Patienthas complained of back painf and was given apparently BenGay this apparently did help some-she has Ultramt when necessary but apparently rarely takes this  Lately patient has apparently complained in nursing staff of some bilateral leg pain nursing staff also noted at some point some increased edema apparently of her left leg.  Review of systems is somewhat limited secondary to language barriers patient does not speak English but is able to communicate fairly effectively with staff.    patient does have when necessary tramadol apparently she is hesitant to take this possibly was some increased confusion again this is somewhat unclear      \-  .  . .  Past Medical History   Diagnosis  Date   .  Hypertension    .  Diabetes mellitus    .  Gout    .  Past Surgical History   Procedure  Laterality  Date   .  No past surgeries      Current Outpatient Prescriptions on File Prior to Visit   Medication  Sig  Dispense   Refill   .  aspirin 81 MG chewable tablet  Chew 1 tablet (81 mg total) by mouth daily.     .  capsaicin (ZOSTRIX) 0.025 % cream  Apply topically 2 (two) times daily. Apply to legs/calves and ankles bid  60 g  0   .  FLUoxetine (PROZAC) 10 MG capsule  Take 1 capsule (10 mg total) by mouth at bedtime.  30 capsule  1   .  furosemide (LASIX) 40 MG tablet  Take 40 mg by mouth every morning.     .  gabapentin (NEURONTIN) 300 MG capsule  Take 1 capsule (300 mg total) by mouth at bedtime.  30 capsule  1   .  guaiFENesin (MUCINEX) 600 MG 12 hr tablet  Take 600 mg by mouth 2 (two) times daily as needed for to loosen phlegm.     Marland Kitchen.  HYDROcodone-homatropine (HYCODAN) 5-1.5 MG/5ML syrup  Take 5 mLs by mouth every 6 (six) hours as needed for cough.  120 mL  0   .     2  0   .  losartan (COZAAR) 25 MG tablet  Take 25 mg by mouth every morning.     .  metFORMIN (GLUCOPHAGE-XR) 500 MG 24 hr tablet  Take 500 mg by mouth every evening.     .  senna-docusate (SENOKOT-S) 8.6-50  MG per tablet  Take 2 tablets by mouth 2 (two) times daily.  60 tablet  0   .  traMADol-acetaminophen (ULTRACET) 37.5-325 MG per tablet  Take one tablet by mouth every four hours as needed for pain; Take two tablets by mouth every four hours as needed for severe pain  60 tablet  0   Potassium 20 mEq daily  .   Social history; there is very little information available. questionably the patient is from [place].   Family history; not available from any current source   Review of systems;  Limited secondary to patient does not speak English-apparently she has complaining some increased leg pain bilaterally recently  -  Have not been any complaints to my knowledge of shortness of breath chest pain she does appear to have some history of neuropathy she is on Neurontin.  -  --  Physical examination   Temperature 98.2 pulse 80 respirations 20 blood pressure 131/70   In general this is somewhat frail pleasant elderly female in no distress  .  Her skin is warm and dry-    .   Oropharynx is clear mucous membranes moist   Breasts on the upper lateral quadrant of the right breast there continues to Isaac a small firm area that does not appear to Stephnie acutely tender this is chronic and patient and family have indicated they do not want aggressive workup  t.  Chest-clear to auscultation no labored breathing  Cardiac heart sounds are normal no murmurs no carotid bruits--rate is regular--quite minimal edema  Abdomen; soft nontender with positive bowel sounds  Muscle skeletal  Moves all extremities x 4--is able to ambulate with walker she appears to have arthritic changes of her knees bilaterally but this is baseline I do not see increased edema of her knees she does appear to have some slight edema of her lower left leg compared to the right she has reduced pedal pulses bilaterally again she is able to ambulate with a walker I saw her working with therapy earlier today in fact---.  Psych again she speaks little English was pleasant easily follow verbal commands with prompting   Labs  08/01/2014.  Sodium 139 potassium 4.3 BUN 19 creatinine 1.1.  Albumin 3.2 otherwise liver function tests within normal limits.  WBC 4.4 hemoglobin 10.7 platelets 174.    04/11/2014.  Sodium 141 potassium 3.7 BUN 14 creatinine 0.8.  WBC 4.0 hemoglobin 11.0 platelets 197.   01/25/2014.  WBC 6.8 hemoglobin 12.1 platelets 223.  Sodium 139 potassium 4.2 BUN 16 creatinine 0.87.  Hemoglobin A1c 6.0.  Liver function tests within normal limits  12/03/2013.  Sodium 141 potassium 3.9 BUN 21 creatinine 0.8.  10/31/2013.  Hemoglobin A1c 5.8.  10/31/2013.  Sodium 140 potassium 3.2 BUN 14 creatinine 0.8.  10/22/2013.  CBC 4.2 hemoglobin 11.5 platelets 214.  Hemoglobin A1c 5.6.  Liver function tests within normal limits except albumin of 3.4  .  Impression/plan  ##1-  Bilateral leg pain slight left leg edema-this is nonerythematous and  nontender-we'll order x-rays of the lower legs bilaterally as well as knee-also will order venous Doppler of the left leg rule out any DVT although I suspect likelihood of this is fairly low.  In regards to pain management this is somewhat of an issue some questionable history of possible confusion with Ultram-will have nursing staff speak with family when they are available to sort this out consider possibly Vicodin-- again will have to get a few more details-so far she  has Tylenol as well as Neurontin she does not appear to [place] in any acute distress certainly at this point   #2-history of right breast lesion-again patient apparently has refused followup-          [id number]--of note greater than 25 minutes spent assessing patient discussing her status with nursing staff reviewing her chart-and coordinating plan of care-of note greater than 50% of time spent coordinating and formulating a plan of care    .

## 2014-08-22 DIAGNOSIS — I5032 Chronic diastolic (congestive) heart failure: Secondary | ICD-10-CM | POA: Diagnosis not present

## 2014-08-22 DIAGNOSIS — I10 Essential (primary) hypertension: Secondary | ICD-10-CM | POA: Diagnosis not present

## 2014-08-22 DIAGNOSIS — E08319 Diabetes mellitus due to underlying condition with unspecified diabetic retinopathy without macular edema: Secondary | ICD-10-CM | POA: Diagnosis not present

## 2014-08-22 LAB — BASIC METABOLIC PANEL
BUN: 13 mg/dL (ref 4–21)
Glucose: 122 mg/dL
POTASSIUM: 4.1 mmol/L (ref 3.4–5.3)
SODIUM: 140 mmol/L (ref 137–147)

## 2014-08-24 ENCOUNTER — Encounter: Payer: Self-pay | Admitting: Internal Medicine

## 2014-09-03 ENCOUNTER — Non-Acute Institutional Stay (SKILLED_NURSING_FACILITY): Payer: Medicare Other | Admitting: Internal Medicine

## 2014-09-03 ENCOUNTER — Encounter: Payer: Self-pay | Admitting: Internal Medicine

## 2014-09-03 DIAGNOSIS — E1140 Type 2 diabetes mellitus with diabetic neuropathy, unspecified: Secondary | ICD-10-CM

## 2014-09-03 DIAGNOSIS — N183 Chronic kidney disease, stage 3 unspecified: Secondary | ICD-10-CM

## 2014-09-03 DIAGNOSIS — D509 Iron deficiency anemia, unspecified: Secondary | ICD-10-CM

## 2014-09-03 DIAGNOSIS — N182 Chronic kidney disease, stage 2 (mild): Secondary | ICD-10-CM

## 2014-09-03 DIAGNOSIS — E1149 Type 2 diabetes mellitus with other diabetic neurological complication: Secondary | ICD-10-CM | POA: Diagnosis not present

## 2014-09-03 DIAGNOSIS — I5032 Chronic diastolic (congestive) heart failure: Secondary | ICD-10-CM

## 2014-09-03 DIAGNOSIS — I110 Hypertensive heart disease with heart failure: Secondary | ICD-10-CM

## 2014-09-03 DIAGNOSIS — D638 Anemia in other chronic diseases classified elsewhere: Secondary | ICD-10-CM | POA: Diagnosis not present

## 2014-09-03 DIAGNOSIS — I509 Heart failure, unspecified: Secondary | ICD-10-CM

## 2014-09-03 DIAGNOSIS — E1142 Type 2 diabetes mellitus with diabetic polyneuropathy: Secondary | ICD-10-CM

## 2014-09-03 HISTORY — DX: Iron deficiency anemia, unspecified: D50.9

## 2014-09-03 HISTORY — DX: Chronic kidney disease, stage 2 (mild): N18.2

## 2014-09-03 NOTE — Assessment & Plan Note (Signed)
Appears controlled on neurontin 300 mg qHS; Plan - continue nightly neurontin

## 2014-09-03 NOTE — Assessment & Plan Note (Signed)
In 07/2014 A1c was 6.0 on metformin. Pt on ARB; plan - continue metformin ER 500 mg daily

## 2014-09-03 NOTE — Assessment & Plan Note (Signed)
Bp stable on cozaar and lasix;Plan -continue cozaar 25 mg and lasix 40 mg daily

## 2014-09-03 NOTE — Assessment & Plan Note (Signed)
In 07/2014 Hb 10.7/HCT 32.8 with MCV 91.1; slow but steady decline ;Plan - check iron ,B12, folate

## 2014-09-03 NOTE — Assessment & Plan Note (Signed)
In 07/2014 GFR was 52 with BUN 19/Cr 1.1; plan - continue ARB, continue to monitor

## 2014-09-03 NOTE — Progress Notes (Signed)
MRN: [id number] Name: Margaret [name]  Sex: female Age: 81 y.o. DOB: 1931-10-13  PSC #: [name] far. Facility/Room:213 Level Of Care: SNF Provider: Merrilee Seashore D Emergency Contacts: Extended Emergency Contact Information Primary Emergency Contact: Stettner,[name] Address: [address]          [place], [place] [address] [name] of [place] Home Phone: [phone] Mobile Phone: [phone] Relation: Sister Secondary Emergency Contact: [name],[name] Address: [address]          [place], [place] [address] [name] of [place] Home Phone: [phone] Relation: Grandaughter  Code Status: FULL  Allergies: Review of patient's allergies indicates no known allergies.  Chief Complaint  Patient presents with  . Medical Management of Chronic Issues    HPI: Patient is 81 y.o. female who is being seen for routine issues.  Past Medical History  Diagnosis Date  . Diabetes mellitus without complication   . Hypertension   . Diabetes mellitus   . Gout   . Chronic diastolic heart failure     Past Surgical History  Procedure Laterality Date  . No past surgeries        Medication List       This list is accurate as of: 09/03/14  2:50 PM.  Always use your most recent med list.               aspirin 81 MG chewable tablet  Chew 1 tablet (81 mg total) by mouth daily.     furosemide 40 MG tablet  Commonly known as:  LASIX  Take 40 mg by mouth every morning.     gabapentin 300 MG capsule  Commonly known as:  NEURONTIN  Take 1 capsule (300 mg total) by mouth at bedtime.     losartan 25 MG tablet  Commonly known as:  COZAAR  Take 25 mg by mouth every morning.     metFORMIN 500 MG 24 hr tablet  Commonly known as:  GLUCOPHAGE-XR  Take 500 mg by mouth every evening.     omeprazole 20 MG capsule  Commonly known as:  PRILOSEC  Take 20 mg by mouth daily.     potassium chloride 20 MEQ packet  Commonly known as:  KLOR-CON  Take 20 mEq by mouth daily.      senna-docusate 8.6-50 MG per tablet  Commonly known as:  Senokot-S  Take 2 tablets by mouth 2 (two) times daily.     traMADol-acetaminophen 37.5-325 MG per tablet  Commonly known as:  ULTRACET  Take one tablet by mouth every four hours as needed for pain; Take two tablets by mouth every four hours as needed for severe pain        Meds ordered this encounter  Medications  . omeprazole (PRILOSEC) 20 MG capsule    Sig: Take 20 mg by mouth daily.    Immunization History  Administered Date(s) Administered  . Pneumococcal Polysaccharide-23 06/23/2013    History  Substance Use Topics  . Smoking status: Former Games developer  . Smokeless tobacco: Not on file  . Alcohol Use: No    Review of Systems  DATA OBTAINED: from patient, nurse GENERAL:  no fevers, fatigue, appetite changes SKIN: No itching, rash HEENT: No complaint RESPIRATORY: No cough, wheezing, SOB CARDIAC: No chest pain, palpitations,  lower extremity edema  GI: No abdominal pain, No N/V/D or constipation, No heartburn or reflux  GU: No dysuria, frequency or urgency, or incontinence  MUSCULOSKELETAL: No unrelieved bone/joint pain NEUROLOGIC: No headache, dizziness , some leg  pain PSYCHIATRIC: No overt anxiety or sadness  Filed Vitals:   09/03/14 1428  BP: 139/83  Pulse: 81  Temp: 96.9 F (36.1 C)  Resp: 18    Physical Exam  GENERAL APPEARANCE: Alert, conversant, No acute distress  SKIN: No diaphoresis rash, or wounds HEENT: Unremarkable RESPIRATORY: Breathing is even, unlabored. Lung sounds are clear   CARDIOVASCULAR: Heart RRR no murmurs, rubs or gallops. Trace peripheral edema  GASTROINTESTINAL: Abdomen is soft, non-tender, not distended w/ normal bowel sounds.  GENITOURINARY: Bladder non tender, not distended  MUSCULOSKELETAL: No abnormal joints or musculature NEUROLOGIC: Cranial nerves 2-12 grossly intact. Moves all extremities PSYCHIATRIC: Mood and affect appropriate to situation, no behavioral  issues  Patient Active Problem List   Diagnosis Date Noted  . Anemia of chronic disease 09/03/2014  . Chronic renal disease, stage 3, moderately decreased glomerular filtration rate (GFR) between 30-59 mL/min/1.73 square meter 09/03/2014  . Pain in joint, lower leg 08/21/2014  . Cough 01/27/2014  . Dizziness 01/12/2014  . Contusion of unspecified site 01/12/2014  . Lump in female breast 12/13/2013  . Lumbago 10/23/2013  . Type 2 diabetes, controlled, with neuropathy 10/18/2013  . 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 Unspecified constipation 10/18/2013  . Plantar fasciitis, left 07/29/2013  . Physical deconditioning 07/21/2013  . Diabetes mellitus, controlled 07/19/2013  . Chronic diastolic CHF (congestive heart failure) 07/19/2013  . HCAP (healthcare-associated pneumonia) 07/18/2013  . Chronic diastolic heart failure 06/24/2013  . Syncope 06/21/2013  . Syncope and collapse 06/21/2013  . Leukocytosis 06/21/2013  . Hypertensive heart disease with congestive heart failure 06/21/2013  . Diabetes mellitus due to underlying condition 06/21/2013    CBC    Component Value Date/Time   WBC 4.7 07/22/2013 0420   RBC 3.56* 07/22/2013 0420   HGB 10.4* 07/22/2013 0420   HCT 31.2* 07/22/2013 0420   PLT 222 07/22/2013 0420   MCV 87.6 07/22/2013 0420   LYMPHSABS 1.1 07/18/2013 1910   MONOABS 0.9 07/18/2013 1910   EOSABS 0.1 07/18/2013 1910   BASOSABS 0.0 07/18/2013 1910    CMP     Component Value Date/Time   NA 137 07/22/2013 0420   K 3.4* 07/22/2013 0420   CL 97 07/22/2013 0420   CO2 29 07/22/2013 0420   GLUCOSE 98 07/22/2013 0420   BUN 10 07/22/2013 0420   CREATININE 0.63 07/22/2013 0420   CALCIUM 8.7 07/22/2013 0420   PROT 7.4 07/18/2013 1910   ALBUMIN 3.1* 07/18/2013 1910   AST 13 07/18/2013 1910   ALT 9 07/18/2013 1910   ALKPHOS 84 07/18/2013 1910   BILITOT 0.8 07/18/2013 1910   GFRNONAA 83* 07/22/2013 0420   GFRAA >90 07/22/2013  0420    Assessment and Plan  Anemia of chronic disease In 07/2014 Hb 10.7/HCT 32.8 with MCV 91.1; slow but steady decline ;Plan - check iron ,B12, folate   Type 2 diabetes, controlled, with neuropathy In 07/2014 A1c was 6.0 on metformin. Pt on ARB; plan - continue metformin ER 500 mg daily   Hypertensive heart disease with congestive heart failure Bp stable on cozaar and lasix;Plan -continue cozaar 25 mg and lasix 40 mg daily   Chronic diastolic CHF (congestive heart failure) Chronic and stable without exacerbation; pt is on lasix and cozaar; Plan- continue current meds   Peripheral sensory neuropathy due to type 2 diabetes mellitus Appears controlled on neurontin 300 mg qHS; Plan - continue nightly neurontin   Chronic renal disease, stage 3, moderately decreased glomerular  filtration rate (GFR) between 30-59 mL/min/1.73 square meter In 07/2014 GFR was 52 with BUN 19/Cr 1.1; plan - continue ARB, continue to monitor     [name], MD

## 2014-09-03 NOTE — Assessment & Plan Note (Signed)
Chronic and stable without exacerbation; pt is on lasix and cozaar; Plan- continue current meds

## 2014-09-04 DIAGNOSIS — D510 Vitamin B12 deficiency anemia due to intrinsic factor deficiency: Secondary | ICD-10-CM | POA: Diagnosis not present

## 2014-09-04 DIAGNOSIS — D509 Iron deficiency anemia, unspecified: Secondary | ICD-10-CM | POA: Diagnosis not present

## 2014-09-04 DIAGNOSIS — I10 Essential (primary) hypertension: Secondary | ICD-10-CM | POA: Diagnosis not present

## 2014-09-04 LAB — LIPID PANEL
Cholesterol: 146 mg/dL (ref 0–200)
HDL: 34 mg/dL — AB (ref 35–70)
LDL Cholesterol: 94 mg/dL
Triglycerides: 90 mg/dL (ref 40–160)

## 2014-10-23 DIAGNOSIS — R05 Cough: Secondary | ICD-10-CM | POA: Diagnosis not present

## 2014-10-27 ENCOUNTER — Non-Acute Institutional Stay (SKILLED_NURSING_FACILITY): Payer: Medicare Other | Admitting: Internal Medicine

## 2014-10-27 DIAGNOSIS — I5032 Chronic diastolic (congestive) heart failure: Secondary | ICD-10-CM | POA: Diagnosis not present

## 2014-10-27 DIAGNOSIS — E1140 Type 2 diabetes mellitus with diabetic neuropathy, unspecified: Secondary | ICD-10-CM

## 2014-10-27 DIAGNOSIS — R05 Cough: Secondary | ICD-10-CM | POA: Diagnosis not present

## 2014-10-27 DIAGNOSIS — D638 Anemia in other chronic diseases classified elsewhere: Secondary | ICD-10-CM

## 2014-10-27 DIAGNOSIS — R059 Cough, unspecified: Secondary | ICD-10-CM

## 2014-10-28 DIAGNOSIS — I10 Essential (primary) hypertension: Secondary | ICD-10-CM | POA: Diagnosis not present

## 2014-10-28 LAB — CBC AND DIFFERENTIAL
HCT: 36 % (ref 36–46)
HEMOGLOBIN: 11.6 g/dL — AB (ref 12.0–16.0)
Platelets: 173 10*3/uL (ref 150–399)
WBC: 5.3 10*3/mL

## 2014-10-29 DIAGNOSIS — I5032 Chronic diastolic (congestive) heart failure: Secondary | ICD-10-CM | POA: Diagnosis not present

## 2014-10-29 DIAGNOSIS — R1312 Dysphagia, oropharyngeal phase: Secondary | ICD-10-CM | POA: Diagnosis not present

## 2014-10-30 DIAGNOSIS — F329 Major depressive disorder, single episode, unspecified: Secondary | ICD-10-CM | POA: Diagnosis not present

## 2014-11-03 ENCOUNTER — Encounter: Payer: Self-pay | Admitting: *Deleted

## 2014-11-09 ENCOUNTER — Encounter: Payer: Self-pay | Admitting: Internal Medicine

## 2014-11-09 NOTE — Progress Notes (Signed)
Patient ID: Margaret [name], female   DOB: May 03, 1932, 81 y.o.   MRN: [id number] MRN: [id number] Name: Margaret [name]  Sex: female Age: 81 y.o. DOB: October 07, 1931  PSC #: Margaret [name] far. Facility/Room:213 Level Of Care: SNF Provider: Roena Malady Emergency Contacts: Extended Emergency Contact Information Primary Emergency Contact: [name],Margaret Address: [address]          [place], [place] [address] Margaret [name] of [place] Home Phone: [phone] Mobile Phone: [phone] Relation: Sister Secondary Emergency Contact: [name],Margaret Address: [address]          [place], [place] [address] Margaret [name] of [place] Home Phone: [phone] Relation: Grandaughter  Code Status: FULL  Allergies: Tequin  Chief Complaint  Patient presents with  . Medical Management of Chronic Issues    HPI: Patient is 81 y.o. female who is being seen for routine issues as well as follow-up of a cough which apparently she developed several days ago.  She has been started on Mucinex she is on day for 7-she also apparently is complaining of a sore throat.  Nursing staff apparently  has noted some throat nasal drainage.  Her vital signs are stable O2 saturation 95% on room air  Past Medical History  Diagnosis Date  . Diabetes mellitus without complication   . Hypertension   . Diabetes mellitus   . Gout   . Chronic diastolic heart failure     Past Surgical History  Procedure Laterality Date  . No past surgeries        Medication List       This list is accurate as of: 10/27/14 11:59 PM.  Always use your most recent med list.               aspirin 81 MG chewable tablet  Chew 1 tablet (81 mg total) by mouth daily.     furosemide 40 MG tablet  Commonly known as:  LASIX  Take 40 mg by mouth every morning.     gabapentin 300 MG capsule  Commonly known as:  NEURONTIN  Take 1 capsule (300 mg total) by mouth at bedtime.     losartan 25 MG tablet  Commonly known as:  COZAAR  Take 25 mg by  mouth every morning. For HTN     metFORMIN 500 MG 24 hr tablet  Commonly known as:  GLUCOPHAGE-XR  Take 500 mg by mouth every evening. For diabetes     omeprazole 20 MG capsule  Commonly known as:  PRILOSEC  Take 20 mg by mouth daily. For GERD     potassium chloride 20 MEQ packet  Commonly known as:  KLOR-CON  Take 20 mEq by mouth daily. For K+ supplement     senna-docusate 8.6-50 MG per tablet  Commonly known as:  Senokot-S  Take 2 tablets by mouth 2 (two) times daily.     traMADol-acetaminophen 37.5-325 MG per tablet  Commonly known as:  ULTRACET  Take one tablet by mouth every four hours as needed for pain; Take two tablets by mouth every four hours as needed for severe pain        No orders of the defined types were placed in this encounter.    Immunization History  Administered Date(s) Administered  . PPD Test 08/12/2013  . Pneumococcal Polysaccharide-23 06/23/2013    History  Substance Use Topics  . Smoking status: Former Games developer  . Smokeless tobacco: Not on file  . Alcohol Use: No    Review of Systems  DATA OBTAINED: from patient, nurse GENERAL:  no fevers, fatigue, appetite changes SKIN: No itching, rash HEENT: [name] some nasal throat drainage sore throat at times t RESPIRATORY: Has a cough-does not complain of shortness of breath CARDIAC: No chest pain, palpitations,  lower extremity edema  GI: No abdominal pain, No N/V/D or constipation, No heartburn or reflux  GU: No dysuria, frequency or urgency, or incontinence  MUSCULOSKELETAL: No unrelieved bone/joint pain NEUROLOGIC: No headache, dizziness , some leg pain PSYCHIATRIC: No overt anxiety or sadness  Filed Vitals:   11/09/14 2340  BP: 128/61  Pulse: 68  Temp: 98.8 F (37.1 C)  Resp: 20    Physical Exam  GENERAL APPEARANCE: Alert, conversant, No acute distress  SKIN: No diaphoresis rash, or wounds HEENT: possibly some slight clear drainage in the oropharynx  mucous membranes are  moist RESPIRATORY: Breathing is even, unlabored. Lung sounds are clear --some minimal congestion that clears quickly with cough  CARDIOVASCULAR: Heart RRR no murmurs, rubs or gallops. Trace peripheral edema  GASTROINTESTINAL: Abdomen is soft, non-tender, not distended w/ normal bowel sounds.  GENITOURINARY: Bladder non tender, not distended  MUSCULOSKELETAL: No abnormal joints or musculature I did not note any deformities NEUROLOGIC: Cranial nerves 2-12 grossly intact. Moves all extremities PSYCHIATRIC: Mood and affect appropriate to situation, no behavioral issues  Patient Active Problem List   Diagnosis Date Noted  . Anemia of chronic disease 09/03/2014  . Chronic renal disease, stage 3, moderately decreased glomerular filtration rate (GFR) between 30-59 mL/min/1.73 square meter 09/03/2014  . Pain in joint, lower leg 08/21/2014  . Cough 01/27/2014  . Dizziness 01/12/2014  . Contusion of unspecified site 01/12/2014  . Lump in female breast 12/13/2013  . Lumbago 10/23/2013  . Type 2 diabetes, controlled, with neuropathy 10/18/2013  . 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 Unspecified constipation 10/18/2013  . Plantar fasciitis, left 07/29/2013  . Physical deconditioning 07/21/2013  . Diabetes mellitus, controlled 07/19/2013  . Chronic diastolic CHF (congestive heart failure) 07/19/2013  . HCAP (healthcare-associated pneumonia) 07/18/2013  . Chronic diastolic heart failure 06/24/2013  . Syncope 06/21/2013  . Syncope and collapse 06/21/2013  . Leukocytosis 06/21/2013  . Hypertensive heart disease with congestive heart failure 06/21/2013  . Diabetes mellitus due to underlying condition 06/21/2013   Labs. 09/04/2014.  Cholesterol 146-HDL 34-LDL 94-triglycerides 90.  B12 302-folate 751.  08/02/2014.  Sodium 140 potassium 4.1 BUN 13 creatinine 0.9 CO2 level XXVIII.  Liver function tests within normal limits except albumin of  3.1.  07/31/2014.  WBC 4.4 hemoglobin 10.7 platelets 174.  Hemoglobin A1c 6       Assessment and Plan  Cough-she has been started on Mucinex apparently this is helping she is on day for 7-also duo nebs when necessary every 6 hours.  Suspect there is an element of allergic rhinitis apparently she has a history of this will start Claritin 10 mg a day for 5 days as well  Or sore throat will start Chloraseptic when necessary 3 times a day for 7 days.  Monitor with vital signs pulse ox every shift for 72 hours.  #2-history of anemia chronic disease-B12 and folate were unremarkable-hemoglobin 10.7 appear to Zykeriah trending down slightly-we'll update this.  #3 history of diabetes type 2 this appears to Edlin stable hemoglobin A1c 6.0-she is on Glucophage.  #4-history of CHF-this is been stable she is on Cozaar 25 mg daily Lasix 40 mg a day potassium 20 mEq a day-will check a BMP if  family is okay with this.  #5-history of peripheral neuropathy she does get Neurontin at night apparently this is stable as well she does not really complain of neuropathic pain today.  [id number]    ,

## 2014-11-13 DIAGNOSIS — F329 Major depressive disorder, single episode, unspecified: Secondary | ICD-10-CM | POA: Diagnosis not present

## 2014-11-25 ENCOUNTER — Non-Acute Institutional Stay (SKILLED_NURSING_FACILITY): Payer: Medicare Other | Admitting: Internal Medicine

## 2014-11-25 ENCOUNTER — Encounter: Payer: Self-pay | Admitting: Internal Medicine

## 2014-11-25 DIAGNOSIS — D638 Anemia in other chronic diseases classified elsewhere: Secondary | ICD-10-CM

## 2014-11-25 DIAGNOSIS — E1140 Type 2 diabetes mellitus with diabetic neuropathy, unspecified: Secondary | ICD-10-CM

## 2014-11-25 DIAGNOSIS — N183 Chronic kidney disease, stage 3 unspecified: Secondary | ICD-10-CM

## 2014-11-25 DIAGNOSIS — I110 Hypertensive heart disease with heart failure: Secondary | ICD-10-CM | POA: Diagnosis not present

## 2014-11-25 DIAGNOSIS — F329 Major depressive disorder, single episode, unspecified: Secondary | ICD-10-CM

## 2014-11-25 DIAGNOSIS — F32A Depression, unspecified: Secondary | ICD-10-CM | POA: Insufficient documentation

## 2014-11-25 DIAGNOSIS — I509 Heart failure, unspecified: Secondary | ICD-10-CM | POA: Diagnosis not present

## 2014-11-25 NOTE — Assessment & Plan Note (Signed)
In 10/2014 BUN/Cr was 12/0.83 with a GFR> 60 so improved over prior.Will cont monitor.

## 2014-11-25 NOTE — Progress Notes (Signed)
MRN: [id number] Name: Margaret [name]  Sex: female Age: 81 y.o. DOB: [birth year]  [facility] Facility[facility] Level Of Care: SNF Provider: Merrilee SeashoreALEXANDER, Keyia Moretto D Emergency Contacts: Extended Emergency Contact Information Primary Emergency Contact: Banas,[name] Address: [address]          [place], [place] [address] [place] Home Phone: [phone] Mobile Phone: [phone] Relation: Sister Secondary Emergency Contact: [name],[name] Address: [address]          [place], [place] [address] [place] Home Phone: [phone] Relation: Grandaughter  Code Status: FULL  Allergies: Tequin  Chief Complaint  Patient presents with  . Medical Management of Chronic Issues    HPI: Patient is 81 y.o. female who is being seen for routine issues.  Past Medical History  Diagnosis Date  . Diabetes mellitus without complication   . Hypertension   . Diabetes mellitus   . Gout   . Chronic diastolic heart failure     Past Surgical History  Procedure Laterality Date  . No past surgeries        Medication List       This list is accurate as of: 11/25/14  4:36 PM.  Always use your most recent med list.               acetaminophen 325 MG tablet  Commonly known as:  TYLENOL  Take 650 mg by mouth every 6 (six) hours as needed.     aspirin 81 MG chewable tablet  Chew 1 tablet (81 mg total) by mouth daily.     FLUoxetine 10 MG tablet  Commonly known as:  PROZAC  Take 10 mg by mouth daily. For depression     furosemide 40 MG tablet  Commonly known as:  LASIX  Take 40 mg by mouth every morning.     gabapentin 300 MG capsule  Commonly known as:  NEURONTIN  Take 1 capsule (300 mg total) by mouth at bedtime.     losartan 25 MG tablet  Commonly known as:  COZAAR  Take 25 mg by mouth every morning. For HTN     metFORMIN 500 MG 24 hr tablet  Commonly known as:  GLUCOPHAGE-XR  Take 500 mg by mouth every evening. For diabetes     omeprazole  20 MG capsule  Commonly known as:  PRILOSEC  Take 20 mg by mouth daily. For GERD     potassium chloride 20 MEQ packet  Commonly known as:  KLOR-CON  Take 20 mEq by mouth daily. For K+ supplement     Propylene Glycol 0.6 % Soln  Apply 1 drop to eye 3 (three) times daily. For dry eyes     senna-docusate 8.6-50 MG per tablet  Commonly known as:  Senokot-S  Take 2 tablets by mouth 2 (two) times daily.     traMADol-acetaminophen 37.5-325 MG per tablet  Commonly known as:  ULTRACET  Take one tablet by mouth every four hours as needed for pain; Take two tablets by mouth every four hours as needed for severe pain        No orders of the defined types were placed in this encounter.    Immunization History  Administered Date(s) Administered  . PPD Test 08/12/2013  . Pneumococcal Polysaccharide-23 06/23/2013    History  Substance Use Topics  . Smoking status: Former Games developermoker  . Smokeless tobacco: Not on file  . Alcohol Use: No    Review of Systems  DATA OBTAINED:  from nurse GENERAL:  no fevers, fatigue, appetite changes SKIN: No itching, rash HEENT: No complaint RESPIRATORY: No cough, wheezing, SOB CARDIAC: No chest pain, palpitations, lower extremity edema  GI: No abdominal pain, No N/V/D or constipation, No heartburn or reflux  GU: No dysuria, frequency or urgency, or incontinence  MUSCULOSKELETAL: No unrelieved bone/joint pain; has controlled arthritis pain NEUROLOGIC: No headache, dizziness  PSYCHIATRIC: No overt anxiety or sadness  Filed Vitals:   11/25/14 1615  BP: 139/83  Pulse: 80  Temp: 97.5 F (36.4 C)  Resp: 20    Physical Exam  GENERAL APPEARANCE: Alert,non conversant, No acute distress  SKIN: No diaphoresis rash HEENT: Unremarkable RESPIRATORY: Breathing is even, unlabored. Lung sounds are clear   CARDIOVASCULAR: Heart RRR no murmurs, rubs or gallops. No peripheral edema  GASTROINTESTINAL: Abdomen is soft, non-tender, not distended w/ normal bowel  sounds.  GENITOURINARY: Bladder non tender, not distended  MUSCULOSKELETAL: No abnormal joints or musculature NEUROLOGIC: Cranial nerves 2-12 grossly intact PSYCHIATRIC: Mood and affect appropriate to situation, no behavioral issues  Patient Active Problem List   Diagnosis Date Noted  . Depression 11/25/2014  . Anemia of chronic disease 09/03/2014  . Chronic renal disease, stage 3, moderately decreased glomerular filtration rate (GFR) between 30-59 mL/min/1.73 square meter 09/03/2014  . Pain in joint, lower leg 08/21/2014  . Cough 01/27/2014  . Dizziness 01/12/2014  . Contusion of unspecified site 01/12/2014  . Lump in female breast 12/13/2013  . Lumbago 10/23/2013  . Type 2 diabetes, controlled, with neuropathy 10/18/2013  . 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 Unspecified constipation 10/18/2013  . Plantar fasciitis, left 07/29/2013  . Physical deconditioning 07/21/2013  . Diabetes mellitus, controlled 07/19/2013  . Chronic diastolic CHF (congestive heart failure) 07/19/2013  . HCAP (healthcare-associated pneumonia) 07/18/2013  . Chronic diastolic heart failure 06/24/2013  . Syncope 06/21/2013  . Syncope and collapse 06/21/2013  . Leukocytosis 06/21/2013  . Hypertensive heart disease with congestive heart failure 06/21/2013  . Diabetes mellitus due to underlying condition 06/21/2013    CBC    Component Value Date/Time   WBC 5.3 10/28/2014   WBC 4.7 07/22/2013 0420   RBC 3.56* 07/22/2013 0420   HGB 11.6* 10/28/2014   HCT 36 10/28/2014   PLT 173 10/28/2014   MCV 87.6 07/22/2013 0420   LYMPHSABS 1.1 07/18/2013 1910   MONOABS 0.9 07/18/2013 1910   EOSABS 0.1 07/18/2013 1910   BASOSABS 0.0 07/18/2013 1910    CMP     Component Value Date/Time   NA 140 08/22/2014   NA 137 07/22/2013 0420   K 4.1 08/22/2014   CL 97 07/22/2013 0420   CO2 29 07/22/2013 0420   GLUCOSE 98 07/22/2013 0420   BUN 13 08/22/2014   BUN 10 07/22/2013  0420   CREATININE 0.63 07/22/2013 0420   CALCIUM 8.7 07/22/2013 0420   PROT 7.4 07/18/2013 1910   ALBUMIN 3.1* 07/18/2013 1910   AST 13 07/18/2013 1910   ALT 9 07/18/2013 1910   ALKPHOS 84 07/18/2013 1910   BILITOT 0.8 07/18/2013 1910   GFRNONAA 83* 07/22/2013 0420   GFRAA >90 07/22/2013 0420    Assessment and Plan  Depression Psych was consulted to see pt. Since her family has told her she will not Soriyah going back home to [place] pt was sad. Earlier this month pt was seen and started on prozac 10 mg. Nurse reports pt out of her room in activies and I have seen her in the  hall in past few weeks, so a good response.   Anemia of chronic disease In 10/2014 Hb 11.6 which is improved.Pt's iron , B12 nad folate [place] vels were all normal.   Chronic renal disease, stage 3, moderately decreased glomerular filtration rate (GFR) between 30-59 mL/min/1.73 square meter In 10/2014 BUN/Cr was 12/0.83 with a GFR> 60 so improved over prior.Will cont monitor.   Type 2 diabetes, controlled, with neuropathy Last a1c was 6.o and FLP showed LDL94, HDL34 on no meds; pt is on ARB; Plan -monitor but not add any cholesterol med now.   Hypertensive heart disease with congestive heart failure BP still stable on lasix and cozaar;continue these meds     [name], MD

## 2014-11-25 NOTE — Assessment & Plan Note (Signed)
In 10/2014 Hb 11.6 which is improved.Pt's iron , B12 nad folate [place] vels were all normal.

## 2014-11-25 NOTE — Assessment & Plan Note (Signed)
Last a1c was 6.o and FLP showed LDL94, HDL34 on no meds; pt is on ARB; Plan -monitor but not add any cholesterol med now.

## 2014-11-25 NOTE — Assessment & Plan Note (Signed)
Psych was consulted to see pt. Since her family has told her she will not Margaret [name] going back home to [place] pt was sad. Earlier this month pt was seen and started on prozac 10 mg. Nurse reports pt out of her room in activies and I have seen her in the hall in past few weeks, so a good response.

## 2014-11-25 NOTE — Assessment & Plan Note (Signed)
BP still stable on lasix and cozaar;continue these meds

## 2014-12-17 ENCOUNTER — Non-Acute Institutional Stay (SKILLED_NURSING_FACILITY): Payer: Medicare Other | Admitting: Internal Medicine

## 2014-12-17 ENCOUNTER — Encounter: Payer: Self-pay | Admitting: Internal Medicine

## 2014-12-17 DIAGNOSIS — E876 Hypokalemia: Secondary | ICD-10-CM | POA: Diagnosis not present

## 2014-12-17 DIAGNOSIS — E1142 Type 2 diabetes mellitus with diabetic polyneuropathy: Secondary | ICD-10-CM

## 2014-12-17 DIAGNOSIS — E1149 Type 2 diabetes mellitus with other diabetic neurological complication: Secondary | ICD-10-CM | POA: Diagnosis not present

## 2014-12-17 DIAGNOSIS — I5032 Chronic diastolic (congestive) heart failure: Secondary | ICD-10-CM

## 2014-12-17 DIAGNOSIS — K219 Gastro-esophageal reflux disease without esophagitis: Secondary | ICD-10-CM | POA: Insufficient documentation

## 2014-12-17 HISTORY — DX: Gastro-esophageal reflux disease without esophagitis: K21.9

## 2014-12-17 NOTE — Assessment & Plan Note (Signed)
Chronic and stable without exacerbation; Plan- continue protonix 20 mg daily

## 2014-12-17 NOTE — Assessment & Plan Note (Signed)
From chronic lasix use;Plan- cont repletion 20 meq daily

## 2014-12-17 NOTE — Progress Notes (Signed)
MRN: [id number] Name: Margaret [name]  Sex: female Age: 81 y.o. DOB: Feb 15, 1932  [facility]  Facility/Room: 213w Level Of Care: SNF Provider: Merrilee Seashore D Emergency Contacts: Extended Emergency Contact Information Primary Emergency Contact: Pavao,[name] Address: [address]          [place], [place] [address] [name] of [place] Home Phone: [phone] Mobile Phone: [phone] Relation: Sister Secondary Emergency Contact: [name],[name] Address: [address]          [place], [place] [address] [name] of [place] Home Phone: [phone] Relation: Grandaughter  Code Status: FULl  Allergies: Tequin  Chief Complaint  Patient presents with  . Medical Management of Chronic Issues    HPI: Patient is 81 y.o. female who is being seen for routine issues.  Past Medical History  Diagnosis Date  . Diabetes mellitus without complication   . Hypertension   . Diabetes mellitus   . Gout   . Chronic diastolic heart failure     Past Surgical History  Procedure Laterality Date  . No past surgeries        Medication List       This list is accurate as of: 12/17/14  2:28 PM.  Always use your most recent med list.               acetaminophen 325 MG tablet  Commonly known as:  TYLENOL  Take 650 mg by mouth every 6 (six) hours as needed.     aspirin 81 MG chewable tablet  Chew 1 tablet (81 mg total) by mouth daily.     FLUoxetine 10 MG tablet  Commonly known as:  PROZAC  Take 10 mg by mouth daily. For depression     furosemide 40 MG tablet  Commonly known as:  LASIX  Take 40 mg by mouth every morning.     gabapentin 300 MG capsule  Commonly known as:  NEURONTIN  Take 1 capsule (300 mg total) by mouth at bedtime.     losartan 25 MG tablet  Commonly known as:  COZAAR  Take 25 mg by mouth every morning. For HTN     metFORMIN 500 MG 24 hr tablet  Commonly known as:  GLUCOPHAGE-XR  Take 500 mg by mouth every evening. For diabetes     omeprazole 20 MG capsule  Commonly known as:  PRILOSEC  Take 20 mg by mouth daily. For GERD     potassium chloride 20 MEQ packet  Commonly known as:  KLOR-CON  Take 20 mEq by mouth daily. For K+ supplement     Propylene Glycol 0.6 % Soln  Apply 1 drop to eye 3 (three) times daily. For dry eyes     senna-docusate 8.6-50 MG per tablet  Commonly known as:  Senokot-S  Take 2 tablets by mouth 2 (two) times daily.     traMADol-acetaminophen 37.5-325 MG per tablet  Commonly known as:  ULTRACET  Take one tablet by mouth every four hours as needed for pain; Take two tablets by mouth every four hours as needed for severe pain        No orders of the defined types were placed in this encounter.    Immunization History  Administered Date(s) Administered  . PPD Test 08/12/2013  . Pneumococcal Polysaccharide-23 06/23/2013    History  Substance Use Topics  . Smoking status: Former Games developer  . Smokeless tobacco: Not on file  . Alcohol Use: No    Review of Systems  DATA OBTAINED:  from patient, nurse GENERAL:  no fevers, fatigue, appetite changes SKIN: No itching, rash HEENT: No complaint RESPIRATORY: No cough, wheezing, SOB CARDIAC: No chest pain, palpitations, lower extremity edema  GI: No abdominal pain, No N/V/D or constipation, No heartburn or reflux  GU: No dysuria, frequency or urgency, or incontinence  MUSCULOSKELETAL: No unrelieved bone/joint pain NEUROLOGIC: No headache, dizziness  PSYCHIATRIC: No overt anxiety or sadness  Filed Vitals:   12/17/14 1417  BP: 139/83  Pulse: 98  Temp: 97.5 F (36.4 C)  Resp: 18    Physical Exam  GENERAL APPEARANCE: Alert, conversant with monette, No acute distress ; seen sittinf in WC SKIN: No diaphoresis rash HEENT: Unremarkable RESPIRATORY: Breathing is even, unlabored. Lung sounds are clear   CARDIOVASCULAR: Heart RRR no murmurs, rubs or gallops. No peripheral edema  GASTROINTESTINAL: Abdomen is soft, non-tender, not  distended w/ normal bowel sounds.  GENITOURINARY: Bladder non tender, not distended  MUSCULOSKELETAL: No abnormal joints or musculature NEUROLOGIC: Cranial nerves 2-12 grossly intact. Moves all extremities PSYCHIATRIC: appears happy, no behavioral issues  Patient Active Problem List   Diagnosis Date Noted  . GERD (gastroesophageal reflux disease) 12/17/2014  . Hypokalemia 12/17/2014  . Depression 11/25/2014  . Anemia of chronic disease 09/03/2014  . Chronic renal disease, stage 3, moderately decreased glomerular filtration rate (GFR) between 30-59 mL/min/1.73 square meter 09/03/2014  . Pain in joint, lower leg 08/21/2014  . Cough 01/27/2014  . Dizziness 01/12/2014  . Contusion of unspecified site 01/12/2014  . Lump in female breast 12/13/2013  . Lumbago 10/23/2013  . Type 2 diabetes, controlled, with neuropathy 10/18/2013  . 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 Unspecified constipation 10/18/2013  . Plantar fasciitis, left 07/29/2013  . Physical deconditioning 07/21/2013  . Diabetes mellitus, controlled 07/19/2013  . Chronic diastolic CHF (congestive heart failure) 07/19/2013  . HCAP (healthcare-associated pneumonia) 07/18/2013  . Chronic diastolic heart failure 06/24/2013  . Syncope 06/21/2013  . Syncope and collapse 06/21/2013  . Leukocytosis 06/21/2013  . Hypertensive heart disease with congestive heart failure 06/21/2013  . Diabetes mellitus due to underlying condition 06/21/2013    CBC    Component Value Date/Time   WBC 5.3 10/28/2014   WBC 4.7 07/22/2013 0420   RBC 3.56* 07/22/2013 0420   HGB 11.6* 10/28/2014   HCT 36 10/28/2014   PLT 173 10/28/2014   MCV 87.6 07/22/2013 0420   LYMPHSABS 1.1 07/18/2013 1910   MONOABS 0.9 07/18/2013 1910   EOSABS 0.1 07/18/2013 1910   BASOSABS 0.0 07/18/2013 1910    CMP     Component Value Date/Time   NA 140 08/22/2014   NA 137 07/22/2013 0420   K 4.1 08/22/2014   CL 97 07/22/2013  0420   CO2 29 07/22/2013 0420   GLUCOSE 98 07/22/2013 0420   BUN 13 08/22/2014   BUN 10 07/22/2013 0420   CREATININE 0.63 07/22/2013 0420   CALCIUM 8.7 07/22/2013 0420   PROT 7.4 07/18/2013 1910   ALBUMIN 3.1* 07/18/2013 1910   AST 13 07/18/2013 1910   ALT 9 07/18/2013 1910   ALKPHOS 84 07/18/2013 1910   BILITOT 0.8 07/18/2013 1910   GFRNONAA 83* 07/22/2013 0420   GFRAA >90 07/22/2013 0420    Assessment and Plan  Peripheral sensory neuropathy due to type 2 diabetes mellitus Chronic and stable without c/o or concern;plan- continue neurontin 300 mg qHS  Chronic diastolic CHF (congestive heart failure) Stable and chronic without exacerbation [place] edema;Plan- cont cozaar and lasix  GERD (gastroesophageal reflux disease) Chronic and stable without exacerbation; Plan- continue protonix 20 mg daily  Hypokalemia From chronic lasix use;Plan- cont repletion 20 meq daily    [name], MD

## 2014-12-17 NOTE — Assessment & Plan Note (Signed)
Chronic and stable without c/o or concern;plan- continue neurontin 300 mg qHS

## 2014-12-17 NOTE — Assessment & Plan Note (Signed)
Stable and chronic without exacerbation [place] edema;Plan- cont cozaar and lasix

## 2015-01-15 DIAGNOSIS — F329 Major depressive disorder, single episode, unspecified: Secondary | ICD-10-CM | POA: Diagnosis not present

## 2015-01-20 ENCOUNTER — Encounter: Payer: Self-pay | Admitting: Internal Medicine

## 2015-01-20 ENCOUNTER — Non-Acute Institutional Stay (SKILLED_NURSING_FACILITY): Payer: Medicare Other | Admitting: Internal Medicine

## 2015-01-20 DIAGNOSIS — I509 Heart failure, unspecified: Secondary | ICD-10-CM

## 2015-01-20 DIAGNOSIS — E1140 Type 2 diabetes mellitus with diabetic neuropathy, unspecified: Secondary | ICD-10-CM | POA: Diagnosis not present

## 2015-01-20 DIAGNOSIS — F329 Major depressive disorder, single episode, unspecified: Secondary | ICD-10-CM

## 2015-01-20 DIAGNOSIS — F32A Depression, unspecified: Secondary | ICD-10-CM

## 2015-01-20 DIAGNOSIS — I110 Hypertensive heart disease with heart failure: Secondary | ICD-10-CM

## 2015-01-20 NOTE — Assessment & Plan Note (Signed)
Due for FLP and A1c;nursing has no concerns;Plan - continue metfromin ER 500 mg daily and losartan and order A1c and FLP

## 2015-01-20 NOTE — Assessment & Plan Note (Signed)
Pt has been out of her room every day and seems content ; Plan - cont Porzac 10 mg daily

## 2015-01-20 NOTE — Assessment & Plan Note (Signed)
Continues to [place] controlled on current meds;Plan - cont lasix 40 mg and losartan 25 mg

## 2015-01-20 NOTE — Progress Notes (Signed)
MRN: [id number] Name: Margaret [name]  Sex: female Age: 81 y.o. DOB: 20-Sep-1931  [name] Facility/Room:213W Level Of Care: SNF Provider: Merrilee Seashore D Emergency Contacts: Extended Emergency Contact Information Primary Emergency Contact: Carmon,[name] Address: [address]          [place], [place] [address] [name] of [place] Home Phone: [phone] Mobile Phone: [phone] Relation: Sister Secondary Emergency Contact: [name],[name] Address: [address]          [place], [place] [address] [name] of [place] Home Phone: [phone] Relation: Grandaughter  Code Status: FULL  Allergies: Tequin  Chief Complaint  Patient presents with  . Medical Management of Chronic Issues    HPI: Patient is 81 y.o. female who is being seen for HTN, DM2 and depression.  Past Medical History  Diagnosis Date  . Diabetes mellitus without complication   . Hypertension   . Diabetes mellitus   . Gout   . Chronic diastolic heart failure     Past Surgical History  Procedure Laterality Date  . No past surgeries        Medication List       This list is accurate as of: 01/20/15  4:18 PM.  Always use your most recent med list.               acetaminophen 325 MG tablet  Commonly known as:  TYLENOL  Take 650 mg by mouth every 6 (six) hours as needed.     aspirin 81 MG chewable tablet  Chew 1 tablet (81 mg total) by mouth daily.     FLUoxetine 10 MG tablet  Commonly known as:  PROZAC  Take 10 mg by mouth daily. For depression     furosemide 40 MG tablet  Commonly known as:  LASIX  Take 40 mg by mouth every morning.     gabapentin 300 MG capsule  Commonly known as:  NEURONTIN  Take 1 capsule (300 mg total) by mouth at bedtime.     losartan 25 MG tablet  Commonly known as:  COZAAR  Take 25 mg by mouth every morning. For HTN     metFORMIN 500 MG 24 hr tablet  Commonly known as:  GLUCOPHAGE-XR  Take 500 mg by mouth every evening. For diabetes     omeprazole 20 MG capsule  Commonly known as:  PRILOSEC  Take 20 mg by mouth daily. For GERD     potassium chloride 20 MEQ packet  Commonly known as:  KLOR-CON  Take 20 mEq by mouth daily. For K+ supplement     Propylene Glycol 0.6 % Soln  Apply 1 drop to eye 3 (three) times daily. For dry eyes     senna-docusate 8.6-50 MG per tablet  Commonly known as:  Senokot-S  Take 2 tablets by mouth 2 (two) times daily.     traMADol-acetaminophen 37.5-325 MG per tablet  Commonly known as:  ULTRACET  Take one tablet by mouth every four hours as needed for pain; Take two tablets by mouth every four hours as needed for severe pain        No orders of the defined types were placed in this encounter.    Immunization History  Administered Date(s) Administered  . PPD Test 08/12/2013  . Pneumococcal Polysaccharide-23 06/23/2013    History  Substance Use Topics  . Smoking status: Former Games developer  . Smokeless tobacco: Not on file  . Alcohol Use: No    Review of Systems  DATA  OBTAINED: from patient, nurse- no concerns;pt doesn't speak english GENERAL:  no fevers, fatigue, appetite changes SKIN: No itching, rash HEENT: No complaint RESPIRATORY: No cough, wheezing, SOB CARDIAC: No chest pain, palpitations, lower extremity edema  GI: No abdominal pain, No N/V/D or constipation, No heartburn or reflux  GU: No dysuria, frequency or urgency, or incontinence  MUSCULOSKELETAL: No unrelieved bone/joint pain NEUROLOGIC: No headache, dizziness  PSYCHIATRIC: No overt anxiety or sadness  Filed Vitals:   01/20/15 1610  BP: 139/83  Pulse: 77  Temp: 97.5 F (36.4 C)  Resp: 18    Physical Exam  GENERAL APPEARANCE: Alert, non conversant, vietnamease F,No acute distress  SKIN: No diaphoresis rash, or wounds HEENT: Unremarkable RESPIRATORY: Breathing is even, unlabored. Lung sounds are clear   CARDIOVASCULAR: Heart RRR no murmurs, rubs or gallops. No peripheral edema  GASTROINTESTINAL: Abdomen  is soft, non-tender, not distended w/ normal bowel sounds.  GENITOURINARY: Bladder non tender, not distended  MUSCULOSKELETAL: No abnormal joints or musculature NEUROLOGIC: Cranial nerves 2-12 grossly intact. Moves all extremities PSYCHIATRIC: Mood and affect appropriate to situation, no behavioral issues  Patient Active Problem List   Diagnosis Date Noted  . GERD (gastroesophageal reflux disease) 12/17/2014  . Hypokalemia 12/17/2014  . Depression 11/25/2014  . Anemia of chronic disease 09/03/2014  . Chronic renal disease, stage 3, moderately decreased glomerular filtration rate (GFR) between 30-59 mL/min/1.73 square meter 09/03/2014  . Pain in joint, lower leg 08/21/2014  . Cough 01/27/2014  . Dizziness 01/12/2014  . Contusion of unspecified site 01/12/2014  . Lump in female breast 12/13/2013  . Lumbago 10/23/2013  . Type 2 diabetes, controlled, with neuropathy 10/18/2013  . 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 Unspecified constipation 10/18/2013  . Plantar fasciitis, left 07/29/2013  . Physical deconditioning 07/21/2013  . Diabetes mellitus, controlled 07/19/2013  . Chronic diastolic CHF (congestive heart failure) 07/19/2013  . HCAP (healthcare-associated pneumonia) 07/18/2013  . Chronic diastolic heart failure 06/24/2013  . Syncope 06/21/2013  . Syncope and collapse 06/21/2013  . Leukocytosis 06/21/2013  . Hypertensive heart disease with congestive heart failure 06/21/2013  . Diabetes mellitus due to underlying condition 06/21/2013    CBC    Component Value Date/Time   WBC 5.3 10/28/2014   WBC 4.7 07/22/2013 0420   RBC 3.56* 07/22/2013 0420   HGB 11.6* 10/28/2014   HCT 36 10/28/2014   PLT 173 10/28/2014   MCV 87.6 07/22/2013 0420   LYMPHSABS 1.1 07/18/2013 1910   MONOABS 0.9 07/18/2013 1910   EOSABS 0.1 07/18/2013 1910   BASOSABS 0.0 07/18/2013 1910    CMP     Component Value Date/Time   NA 140 08/22/2014   NA 137  07/22/2013 0420   K 4.1 08/22/2014   CL 97 07/22/2013 0420   CO2 29 07/22/2013 0420   GLUCOSE 98 07/22/2013 0420   BUN 13 08/22/2014   BUN 10 07/22/2013 0420   CREATININE 0.63 07/22/2013 0420   CALCIUM 8.7 07/22/2013 0420   PROT 7.4 07/18/2013 1910   ALBUMIN 3.1* 07/18/2013 1910   AST 13 07/18/2013 1910   ALT 9 07/18/2013 1910   ALKPHOS 84 07/18/2013 1910   BILITOT 0.8 07/18/2013 1910   GFRNONAA 83* 07/22/2013 0420   GFRAA >90 07/22/2013 0420    Assessment and Plan  Hypertensive heart disease with congestive heart failure Continues to [place] controlled on current meds;Plan - cont lasix 40 mg and losartan 25 mg  Type 2 diabetes, controlled, with neuropathy Due for  FLP and A1c;nursing has no concerns;Plan - continue metfromin ER 500 mg daily and losartan and order A1c and FLP  Depression Pt has been out of her room every day and seems content ; Plan - cont Porzac 10 mg daily    [name], MD

## 2015-01-21 DIAGNOSIS — I10 Essential (primary) hypertension: Secondary | ICD-10-CM | POA: Diagnosis not present

## 2015-01-21 DIAGNOSIS — E119 Type 2 diabetes mellitus without complications: Secondary | ICD-10-CM | POA: Diagnosis not present

## 2015-01-23 DIAGNOSIS — I10 Essential (primary) hypertension: Secondary | ICD-10-CM | POA: Diagnosis not present

## 2015-01-23 DIAGNOSIS — M109 Gout, unspecified: Secondary | ICD-10-CM | POA: Diagnosis not present

## 2015-01-27 DIAGNOSIS — H11041 Peripheral pterygium, stationary, right eye: Secondary | ICD-10-CM | POA: Diagnosis not present

## 2015-01-27 DIAGNOSIS — Z961 Presence of intraocular lens: Secondary | ICD-10-CM | POA: Diagnosis not present

## 2015-01-27 LAB — HM DIABETES EYE EXAM

## 2015-02-18 ENCOUNTER — Encounter: Payer: Self-pay | Admitting: Internal Medicine

## 2015-02-18 ENCOUNTER — Non-Acute Institutional Stay (SKILLED_NURSING_FACILITY): Payer: Medicare Other | Admitting: Internal Medicine

## 2015-02-18 DIAGNOSIS — N182 Chronic kidney disease, stage 2 (mild): Secondary | ICD-10-CM | POA: Diagnosis not present

## 2015-02-18 DIAGNOSIS — E876 Hypokalemia: Secondary | ICD-10-CM | POA: Diagnosis not present

## 2015-02-18 DIAGNOSIS — D638 Anemia in other chronic diseases classified elsewhere: Secondary | ICD-10-CM

## 2015-02-18 NOTE — Assessment & Plan Note (Signed)
In 7/27/32016   K+ was 3.6; Plan - stable on KCl 20 meq daily, continue

## 2015-02-18 NOTE — Progress Notes (Signed)
MRN: [id number] Name: Margaret [name]  Sex: female Age: 81 y.o. DOB: 10-Mar-1932  [name] Facility/Room:213 Level Of Care: SNF Provider: Merrilee Seashore D Emergency Contacts: Extended Emergency Contact Information Primary Emergency Contact: Braunschweig,[name] Address: [address]          [place], [place] [address] [name] of [place] Home Phone: [phone] Mobile Phone: [phone] Relation: Sister Secondary Emergency Contact: [name],[name] Address: [address]          [place], [place] [address] [name] of [place] Home Phone: [phone] Relation: Grandaughter  Code Status:   Allergies: Tequin  Chief Complaint  Patient presents with  . Medical Management of Chronic Issues    HPI: Patient is 81 y.o. female with dementia, DM2, HTN, CHF who is being seen today for CKD, anemia and hypokalemia.  Past Medical History  Diagnosis Date  . Diabetes mellitus without complication   . Hypertension   . Diabetes mellitus   . Gout   . Chronic diastolic heart failure     Past Surgical History  Procedure Laterality Date  . No past surgeries        Medication List       This list is accurate as of: 02/18/15  1:44 PM.  Always use your most recent med list.               acetaminophen 325 MG tablet  Commonly known as:  TYLENOL  Take 650 mg by mouth every 6 (six) hours as needed.     aspirin 81 MG chewable tablet  Chew 1 tablet (81 mg total) by mouth daily.     FLUoxetine 10 MG tablet  Commonly known as:  PROZAC  Take 10 mg by mouth daily. For depression     furosemide 40 MG tablet  Commonly known as:  LASIX  Take 40 mg by mouth every morning.     gabapentin 300 MG capsule  Commonly known as:  NEURONTIN  Take 1 capsule (300 mg total) by mouth at bedtime.     losartan 25 MG tablet  Commonly known as:  COZAAR  Take 25 mg by mouth every morning. For HTN     metFORMIN 500 MG 24 hr tablet  Commonly known as:  GLUCOPHAGE-XR  Take 500 mg by mouth  every evening. For diabetes     omeprazole 20 MG capsule  Commonly known as:  PRILOSEC  Take 20 mg by mouth daily. For GERD     potassium chloride 20 MEQ packet  Commonly known as:  KLOR-CON  Take 20 mEq by mouth daily. For K+ supplement     Propylene Glycol 0.6 % Soln  Apply 1 drop to eye 3 (three) times daily. For dry eyes     senna-docusate 8.6-50 MG per tablet  Commonly known as:  Senokot-S  Take 2 tablets by mouth 2 (two) times daily.     traMADol-acetaminophen 37.5-325 MG per tablet  Commonly known as:  ULTRACET  Take one tablet by mouth every four hours as needed for pain; Take two tablets by mouth every four hours as needed for severe pain        No orders of the defined types were placed in this encounter.    Immunization History  Administered Date(s) Administered  . PPD Test 08/12/2013  . Pneumococcal Polysaccharide-23 06/23/2013    Social History  Substance Use Topics  . Smoking status: Former Games developer  . Smokeless tobacco: Not on file  . Alcohol Use: No  Review of Systems  DATA OBTAINED: from nurse, pt doesn't speak [place]; nursing without concerns GENERAL:  no fevers, fatigue, appetite changes SKIN: No itching, rash HEENT: No complaint RESPIRATORY: No cough, wheezing, SOB CARDIAC: No chest pain, palpitations, lower extremity edema  GI: No abdominal pain, No N/V/D or constipation, No heartburn or reflux  GU: No dysuria, frequency or urgency, or incontinence  MUSCULOSKELETAL: No unrelieved bone/joint pain NEUROLOGIC: No headache, dizziness  PSYCHIATRIC: No overt anxiety or sadness  Filed Vitals:   02/18/15 1333  BP: 139/83  Pulse: 64  Temp: 97.5 F (36.4 C)  Resp: 18    Physical Exam , GENERAL APPEARANCE: Alert, nonconversant, VFNo acute distress in hall in her chair SKIN: No diaphoresis rash, or wounds HEENT: Unremarkable RESPIRATORY: Breathing is even, unlabored. Lung sounds are clear   CARDIOVASCULAR: Heart RRR no murmurs, rubs or  gallops. No peripheral edema  GASTROINTESTINAL: Abdomen is soft, non-tender, not distended w/ normal bowel sounds.  GENITOURINARY: Bladder non tender, not distended  MUSCULOSKELETAL: No abnormal joints or musculature NEUROLOGIC: Cranial nerves 2-12 grossly intact. Moves all extremities PSYCHIATRIC: dementia, no behavioral issues  Patient Active Problem List   Diagnosis Date Noted  . GERD (gastroesophageal reflux disease) 12/17/2014  . Hypokalemia 12/17/2014  . Depression 11/25/2014  . Anemia of chronic disease 09/03/2014  . Chronic renal disease, stage 2, mildly decreased glomerular filtration rate (GFR) between 60-89 mL/min/1.73 square meter 09/03/2014  . Pain in joint, lower leg 08/21/2014  . Cough 01/27/2014  . Dizziness 01/12/2014  . Contusion of unspecified site 01/12/2014  . Lump in female breast 12/13/2013  . Lumbago 10/23/2013  . Type 2 diabetes, controlled, with neuropathy 10/18/2013  . 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 Unspecified constipation 10/18/2013  . Plantar fasciitis, left 07/29/2013  . Physical deconditioning 07/21/2013  . Diabetes mellitus, controlled 07/19/2013  . Chronic diastolic CHF (congestive heart failure) 07/19/2013  . HCAP (healthcare-associated pneumonia) 07/18/2013  . Chronic diastolic heart failure 06/24/2013  . Syncope 06/21/2013  . Syncope and collapse 06/21/2013  . Leukocytosis 06/21/2013  . Hypertensive heart disease with congestive heart failure 06/21/2013  . Diabetes mellitus due to underlying condition 06/21/2013    CBC    Component Value Date/Time   WBC 5.3 10/28/2014   WBC 4.7 07/22/2013 0420   RBC 3.56* 07/22/2013 0420   HGB 11.6* 10/28/2014   HCT 36 10/28/2014   PLT 173 10/28/2014   MCV 87.6 07/22/2013 0420   LYMPHSABS 1.1 07/18/2013 1910   MONOABS 0.9 07/18/2013 1910   EOSABS 0.1 07/18/2013 1910   BASOSABS 0.0 07/18/2013 1910    CMP     Component Value Date/Time   NA 140  08/22/2014   NA 137 07/22/2013 0420   K 4.1 08/22/2014   CL 97 07/22/2013 0420   CO2 29 07/22/2013 0420   GLUCOSE 98 07/22/2013 0420   BUN 13 08/22/2014   BUN 10 07/22/2013 0420   CREATININE 0.63 07/22/2013 0420   CALCIUM 8.7 07/22/2013 0420   PROT 7.4 07/18/2013 1910   ALBUMIN 3.1* 07/18/2013 1910   AST 13 07/18/2013 1910   ALT 9 07/18/2013 1910   ALKPHOS 84 07/18/2013 1910   BILITOT 0.8 07/18/2013 1910   GFRNONAA 83* 07/22/2013 0420   GFRAA >90 07/22/2013 0420    Assessment and Plan  Chronic renal disease, stage 2, mildly decreased glomerular filtration rate (GFR) between 60-89 mL/min/1.73 square meter 12/2014 GFR 81, improved; Plan - update diagnosis  Anemia of chronic disease  On 12/2014 Hb is 11.8 which is stable but iron was low; Plan - start FeSO4 325 mg po daily;cont monitor at intervals  Hypokalemia In 7/27/32016   K+ was 3.6; Plan - stable on KCl 20 meq daily, continue    [name], MD

## 2015-02-18 NOTE — Assessment & Plan Note (Signed)
12/2014 GFR 81, improved; Plan - update diagnosis

## 2015-02-18 NOTE — Assessment & Plan Note (Addendum)
On 12/2014 Hb is 11.8 which is stable but iron was low; Plan - start FeSO4 325 mg po daily;cont monitor at intervals

## 2015-03-21 DIAGNOSIS — Z79899 Other long term (current) drug therapy: Secondary | ICD-10-CM | POA: Diagnosis not present

## 2015-03-21 DIAGNOSIS — M109 Gout, unspecified: Secondary | ICD-10-CM | POA: Diagnosis not present

## 2015-03-21 DIAGNOSIS — I10 Essential (primary) hypertension: Secondary | ICD-10-CM | POA: Diagnosis not present

## 2015-03-23 DIAGNOSIS — Z23 Encounter for immunization: Secondary | ICD-10-CM | POA: Diagnosis not present

## 2015-03-24 ENCOUNTER — Encounter: Payer: Self-pay | Admitting: Internal Medicine

## 2015-03-24 ENCOUNTER — Non-Acute Institutional Stay (SKILLED_NURSING_FACILITY): Payer: Medicare Other | Admitting: Internal Medicine

## 2015-03-24 DIAGNOSIS — N3000 Acute cystitis without hematuria: Secondary | ICD-10-CM | POA: Diagnosis not present

## 2015-03-24 DIAGNOSIS — N390 Urinary tract infection, site not specified: Secondary | ICD-10-CM | POA: Insufficient documentation

## 2015-03-24 NOTE — Assessment & Plan Note (Addendum)
>   100,000 one isolate Klebsiella, and another >50,000 klebsiella , each with slightly different sensitivities; bother were sensitive to Augmintin 875 MG BID for 7 days with floraster 250 mg BID for 10 days

## 2015-03-24 NOTE — Progress Notes (Signed)
MRN: [id number] Name: Margaret [name]  Sex: female Age: 81 y.o. DOB: 25-Sep-1931  [name] Facility/Room: Level Of Care: SNF Provider: Merrilee Seashore D Emergency Contacts: Extended Emergency Contact Information Primary Emergency Contact: [place],[name] Address: [address]          [place], [place] [address] [name] of [place] Home Phone: [phone] Mobile Phone: [phone] Relation: Sister Secondary Emergency Contact: [name],[name] Address: [address]          [place], [place] [address] [name] of [place] Home Phone: [phone] Relation: Grandaughter  Code Status:   Allergies: Tequin  Chief Complaint  Patient presents with  . Acute Visit    HPI: Patient is 81 y.o. female who presented with increased confusion for 1+ days.. There is no fever, n/v colds, coughs or other sx. Urine was + for infection.  Past Medical History  Diagnosis Date  . Diabetes mellitus without complication   . Hypertension   . Diabetes mellitus   . Gout   . Chronic diastolic heart failure     Past Surgical History  Procedure Laterality Date  . No past surgeries        Medication List       This list is accurate as of: 03/24/15  9:55 PM.  Always use your most recent med list.               acetaminophen 325 MG tablet  Commonly known as:  TYLENOL  Take 650 mg by mouth every 6 (six) hours as needed.     aspirin 81 MG chewable tablet  Chew 1 tablet (81 mg total) by mouth daily.     FLUoxetine 10 MG tablet  Commonly known as:  PROZAC  Take 10 mg by mouth daily. For depression     furosemide 40 MG tablet  Commonly known as:  LASIX  Take 40 mg by mouth every morning.     gabapentin 300 MG capsule  Commonly known as:  NEURONTIN  Take 1 capsule (300 mg total) by mouth at bedtime.     losartan 25 MG tablet  Commonly known as:  COZAAR  Take 25 mg by mouth every morning. For HTN     metFORMIN 500 MG 24 hr tablet  Commonly known as:  GLUCOPHAGE-XR  Take  500 mg by mouth every evening. For diabetes     omeprazole 20 MG capsule  Commonly known as:  PRILOSEC  Take 20 mg by mouth daily. For GERD     potassium chloride 20 MEQ packet  Commonly known as:  KLOR-CON  Take 20 mEq by mouth daily. For K+ supplement     Propylene Glycol 0.6 % Soln  Apply 1 drop to eye 3 (three) times daily. For dry eyes     senna-docusate 8.6-50 MG tablet  Commonly known as:  Senokot-S  Take 2 tablets by mouth 2 (two) times daily.     traMADol-acetaminophen 37.5-325 MG tablet  Commonly known as:  ULTRACET  Take one tablet by mouth every four hours as needed for pain; Take two tablets by mouth every four hours as needed for severe pain        No orders of the defined types were placed in this encounter.    Immunization History  Administered Date(s) Administered  . PPD Test 08/12/2013  . Pneumococcal Polysaccharide-23 06/23/2013    Social History  Substance Use Topics  . Smoking status: Former Games developer  . Smokeless tobacco: Not on file  .  Alcohol Use: No    Review of Systems  DATA OBTAINED: from nurse GENERAL:  no fevers, fatigue, appetite changes SKIN: No itching, rash HEENT: No complaint RESPIRATORY: No cough, wheezing, SOB CARDIAC: No chest pain, palpitations, lower extremity edema  GI: No abdominal pain, No N/V/D or constipation, No heartburn or reflux  GU: No dysuria, frequency or urgency, or incontinence  MUSCULOSKELETAL: No unrelieved bone/joint pain NEUROLOGIC: No headache, dizziness  PSYCHIATRIC: confusion  Filed Vitals:   03/24/15 1610  BP: 140/80  Pulse: 80  Temp: 98.6 F (37 C)  Resp: 20    Physical Exam  GENERAL APPEARANCE: Alert, min conversant, No acute distress in WC SKIN: No diaphoresis rash, or wounds HEENT: Unremarkable RESPIRATORY: Breathing is even, unlabored. Lung sounds are clear   CARDIOVASCULAR: Heart RRR no murmurs, rubs or gallops. No peripheral edema  GASTROINTESTINAL: Abdomen is soft, non-tender, not  distended w/ normal bowel sounds.  GENITOURINARY: Bladder non tender, not distended  MUSCULOSKELETAL: No abnormal joints or musculature NEUROLOGIC: Cranial nerves 2-12 grossly intact. Moves all extremities PSYCHIATRIC:somewhat vague acting , no behavioral issues  Patient Active Problem List   Diagnosis Date Noted  . UTI (urinary tract infection) 03/24/2015  . GERD (gastroesophageal reflux disease) 12/17/2014  . Hypokalemia 12/17/2014  . Depression 11/25/2014  . Anemia of chronic disease 09/03/2014  . Chronic renal disease, stage 2, mildly decreased glomerular filtration rate (GFR) between 60-89 mL/min/1.73 square meter 09/03/2014  . Pain in joint, lower leg 08/21/2014  . Cough 01/27/2014  . Dizziness 01/12/2014  . Contusion of unspecified site 01/12/2014  . Lump in female breast 12/13/2013  . Lumbago 10/23/2013  . Type 2 diabetes, controlled, with neuropathy 10/18/2013  . 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 Unspecified constipation 10/18/2013  . Plantar fasciitis, left 07/29/2013  . Physical deconditioning 07/21/2013  . Diabetes mellitus, controlled 07/19/2013  . Chronic diastolic CHF (congestive heart failure) 07/19/2013  . HCAP (healthcare-associated pneumonia) 07/18/2013  . Chronic diastolic heart failure 06/24/2013  . Syncope 06/21/2013  . Syncope and collapse 06/21/2013  . Leukocytosis 06/21/2013  . Hypertensive heart disease with congestive heart failure 06/21/2013  . Diabetes mellitus due to underlying condition 06/21/2013    CBC    Component Value Date/Time   WBC 5.3 10/28/2014   WBC 4.7 07/22/2013 0420   RBC 3.56* 07/22/2013 0420   HGB 11.6* 10/28/2014   HCT 36 10/28/2014   PLT 173 10/28/2014   MCV 87.6 07/22/2013 0420   LYMPHSABS 1.1 07/18/2013 1910   MONOABS 0.9 07/18/2013 1910   EOSABS 0.1 07/18/2013 1910   BASOSABS 0.0 07/18/2013 1910    CMP     Component Value Date/Time   NA 140 08/22/2014   NA 137  07/22/2013 0420   K 4.1 08/22/2014   CL 97 07/22/2013 0420   CO2 29 07/22/2013 0420   GLUCOSE 98 07/22/2013 0420   BUN 13 08/22/2014   BUN 10 07/22/2013 0420   CREATININE 0.63 07/22/2013 0420   CALCIUM 8.7 07/22/2013 0420   PROT 7.4 07/18/2013 1910   ALBUMIN 3.1* 07/18/2013 1910   AST 13 07/18/2013 1910   ALT 9 07/18/2013 1910   ALKPHOS 84 07/18/2013 1910   BILITOT 0.8 07/18/2013 1910   GFRNONAA 83* 07/22/2013 0420   GFRAA >90 07/22/2013 0420    Assessment and Plan  UTI (urinary tract infection) > 100,000 one isolate Klebsiella, and another >50,000 klebsiella , each with slightly different sensitivities; bother were sensitive to Augmintin 875 MG BID  for 7 days with floraster 250 mg BID for 10 days   Time spent 25 min;> 50% of time with patient was spent reviewing records, labs, tests and studies, counseling and developing plan of care  [name], MD

## 2015-03-26 DIAGNOSIS — R2681 Unsteadiness on feet: Secondary | ICD-10-CM | POA: Diagnosis not present

## 2015-03-26 DIAGNOSIS — I5032 Chronic diastolic (congestive) heart failure: Secondary | ICD-10-CM | POA: Diagnosis not present

## 2015-03-26 DIAGNOSIS — R262 Difficulty in walking, not elsewhere classified: Secondary | ICD-10-CM | POA: Diagnosis not present

## 2015-03-27 DIAGNOSIS — R262 Difficulty in walking, not elsewhere classified: Secondary | ICD-10-CM | POA: Diagnosis not present

## 2015-03-27 DIAGNOSIS — I5032 Chronic diastolic (congestive) heart failure: Secondary | ICD-10-CM | POA: Diagnosis not present

## 2015-03-27 DIAGNOSIS — R2681 Unsteadiness on feet: Secondary | ICD-10-CM | POA: Diagnosis not present

## 2015-03-28 DIAGNOSIS — R2681 Unsteadiness on feet: Secondary | ICD-10-CM | POA: Diagnosis not present

## 2015-03-28 DIAGNOSIS — R262 Difficulty in walking, not elsewhere classified: Secondary | ICD-10-CM | POA: Diagnosis not present

## 2015-03-28 DIAGNOSIS — I5032 Chronic diastolic (congestive) heart failure: Secondary | ICD-10-CM | POA: Diagnosis not present

## 2015-03-30 DIAGNOSIS — R2681 Unsteadiness on feet: Secondary | ICD-10-CM | POA: Diagnosis not present

## 2015-03-30 DIAGNOSIS — R262 Difficulty in walking, not elsewhere classified: Secondary | ICD-10-CM | POA: Diagnosis not present

## 2015-03-30 DIAGNOSIS — I5032 Chronic diastolic (congestive) heart failure: Secondary | ICD-10-CM | POA: Diagnosis not present

## 2015-03-31 DIAGNOSIS — R262 Difficulty in walking, not elsewhere classified: Secondary | ICD-10-CM | POA: Diagnosis not present

## 2015-03-31 DIAGNOSIS — R2681 Unsteadiness on feet: Secondary | ICD-10-CM | POA: Diagnosis not present

## 2015-03-31 DIAGNOSIS — I5032 Chronic diastolic (congestive) heart failure: Secondary | ICD-10-CM | POA: Diagnosis not present

## 2015-04-01 DIAGNOSIS — R262 Difficulty in walking, not elsewhere classified: Secondary | ICD-10-CM | POA: Diagnosis not present

## 2015-04-01 DIAGNOSIS — R2681 Unsteadiness on feet: Secondary | ICD-10-CM | POA: Diagnosis not present

## 2015-04-01 DIAGNOSIS — I5032 Chronic diastolic (congestive) heart failure: Secondary | ICD-10-CM | POA: Diagnosis not present

## 2015-04-02 DIAGNOSIS — R262 Difficulty in walking, not elsewhere classified: Secondary | ICD-10-CM | POA: Diagnosis not present

## 2015-04-02 DIAGNOSIS — I5032 Chronic diastolic (congestive) heart failure: Secondary | ICD-10-CM | POA: Diagnosis not present

## 2015-04-02 DIAGNOSIS — R2681 Unsteadiness on feet: Secondary | ICD-10-CM | POA: Diagnosis not present

## 2015-04-03 DIAGNOSIS — I5032 Chronic diastolic (congestive) heart failure: Secondary | ICD-10-CM | POA: Diagnosis not present

## 2015-04-03 DIAGNOSIS — R262 Difficulty in walking, not elsewhere classified: Secondary | ICD-10-CM | POA: Diagnosis not present

## 2015-04-03 DIAGNOSIS — R2681 Unsteadiness on feet: Secondary | ICD-10-CM | POA: Diagnosis not present

## 2015-04-04 DIAGNOSIS — R2681 Unsteadiness on feet: Secondary | ICD-10-CM | POA: Diagnosis not present

## 2015-04-04 DIAGNOSIS — R262 Difficulty in walking, not elsewhere classified: Secondary | ICD-10-CM | POA: Diagnosis not present

## 2015-04-04 DIAGNOSIS — I5032 Chronic diastolic (congestive) heart failure: Secondary | ICD-10-CM | POA: Diagnosis not present

## 2015-04-05 DIAGNOSIS — R2681 Unsteadiness on feet: Secondary | ICD-10-CM | POA: Diagnosis not present

## 2015-04-05 DIAGNOSIS — I5032 Chronic diastolic (congestive) heart failure: Secondary | ICD-10-CM | POA: Diagnosis not present

## 2015-04-05 DIAGNOSIS — R262 Difficulty in walking, not elsewhere classified: Secondary | ICD-10-CM | POA: Diagnosis not present

## 2015-04-06 DIAGNOSIS — R2681 Unsteadiness on feet: Secondary | ICD-10-CM | POA: Diagnosis not present

## 2015-04-06 DIAGNOSIS — R262 Difficulty in walking, not elsewhere classified: Secondary | ICD-10-CM | POA: Diagnosis not present

## 2015-04-06 DIAGNOSIS — I5032 Chronic diastolic (congestive) heart failure: Secondary | ICD-10-CM | POA: Diagnosis not present

## 2015-04-07 DIAGNOSIS — R2681 Unsteadiness on feet: Secondary | ICD-10-CM | POA: Diagnosis not present

## 2015-04-07 DIAGNOSIS — R262 Difficulty in walking, not elsewhere classified: Secondary | ICD-10-CM | POA: Diagnosis not present

## 2015-04-07 DIAGNOSIS — I5032 Chronic diastolic (congestive) heart failure: Secondary | ICD-10-CM | POA: Diagnosis not present

## 2015-04-08 DIAGNOSIS — R2681 Unsteadiness on feet: Secondary | ICD-10-CM | POA: Diagnosis not present

## 2015-04-08 DIAGNOSIS — R262 Difficulty in walking, not elsewhere classified: Secondary | ICD-10-CM | POA: Diagnosis not present

## 2015-04-08 DIAGNOSIS — I5032 Chronic diastolic (congestive) heart failure: Secondary | ICD-10-CM | POA: Diagnosis not present

## 2015-05-02 ENCOUNTER — Encounter: Payer: Self-pay | Admitting: Internal Medicine

## 2015-05-02 ENCOUNTER — Non-Acute Institutional Stay (SKILLED_NURSING_FACILITY): Payer: Medicare Other | Admitting: Internal Medicine

## 2015-05-02 DIAGNOSIS — E1140 Type 2 diabetes mellitus with diabetic neuropathy, unspecified: Secondary | ICD-10-CM

## 2015-05-02 DIAGNOSIS — N189 Chronic kidney disease, unspecified: Secondary | ICD-10-CM

## 2015-05-02 DIAGNOSIS — I5032 Chronic diastolic (congestive) heart failure: Secondary | ICD-10-CM | POA: Diagnosis not present

## 2015-05-02 DIAGNOSIS — I10 Essential (primary) hypertension: Secondary | ICD-10-CM

## 2015-05-04 DIAGNOSIS — E038 Other specified hypothyroidism: Secondary | ICD-10-CM | POA: Diagnosis not present

## 2015-05-04 DIAGNOSIS — E08311 Diabetes mellitus due to underlying condition with unspecified diabetic retinopathy with macular edema: Secondary | ICD-10-CM | POA: Diagnosis not present

## 2015-05-07 DIAGNOSIS — F329 Major depressive disorder, single episode, unspecified: Secondary | ICD-10-CM | POA: Diagnosis not present

## 2015-05-11 ENCOUNTER — Encounter: Payer: Self-pay | Admitting: Internal Medicine

## 2015-05-11 NOTE — Progress Notes (Signed)
Patient ID: Margaret [name], female   DOB: September 29, 1931, 81 y.o.   MRN: [id number] MRN: [id number] Name: Margaret [name]  Sex: female Age: 81 y.o. DOB: [birth year]  [name] Facility/Room:213 Level Of Care: SNF Provider: Roena [name] Emergency Contacts: Extended Emergency Contact Information Primary Emergency Contact: Margaret [name] Address: [address]          [place], [place] [address] Margaret [name] of [place] Home Phone: [phone] Mobile Phone: [phone] Relation: Sister Secondary Emergency Contact: Margaret [name] Address: [address]          [place], [place] [address] Margaret [name] of [place] Home Phone: [phone] Relation: Grandaughter  Code Status:   Allergies: Tequin  Chief Complaint  Patient presents with  . Medical Management of Chronic Issues   including renal insufficiency-anemia of chronic disease-hypertension-diabetes type 2-depression--CHF-dementia  HPI: Patient is 81 y.o. female with the above conditions-she appears to Margaret [name] quite stable she ambulates at times about the facility in a walker appears to Margaret [name] doing well she does complain of left leg pain at times but previous studies including Doppler ultrasounds and x-rays have been negative this appears to Margaret [name] stable.  She has no acute complaints today I am seeing her for routine medical follow-up.  She does have a history of hypertension she is on valsartan this appears to Zanya stable recent blood pressure 132/74.  Her diabetes continues to Terrell well controlled on Glucophage will check a hemoglobin A1c.  Most recently was 5.3 back in July 2016.     Past Medical History  Diagnosis Date  . Diabetes mellitus without complication (HCC)   . Hypertension   . Diabetes mellitus   . Gout   . Chronic diastolic heart failure [facility])     Past Surgical History  Procedure Laterality Date  . No past surgeries        Medication List       This list is accurate as of: 05/02/15 11:59 PM.  Always use your most  recent med list.               acetaminophen 325 MG tablet  Commonly known as:  TYLENOL  Take 650 mg by mouth every 6 (six) hours as needed.     aspirin 81 MG chewable tablet  Chew 1 tablet (81 mg total) by mouth daily.     FLUoxetine 10 MG tablet  Commonly known as:  PROZAC  Take 10 mg by mouth daily. For depression     furosemide 40 MG tablet  Commonly known as:  LASIX  Take 40 mg by mouth every morning.     gabapentin 300 MG capsule  Commonly known as:  NEURONTIN  Take 1 capsule (300 mg total) by mouth at bedtime.     losartan 25 MG tablet  Commonly known as:  COZAAR  Take 25 mg by mouth every morning. For HTN     metFORMIN 500 MG 24 hr tablet  Commonly known as:  GLUCOPHAGE-XR  Take 500 mg by mouth every evening. For diabetes     omeprazole 20 MG capsule  Commonly known as:  PRILOSEC  Take 20 mg by mouth daily. For GERD     potassium chloride 20 MEQ packet  Commonly known as:  KLOR-CON  Take 20 mEq by mouth daily. For K+ supplement     Propylene Glycol 0.6 % Soln  Apply 1 drop to eye 3 (three) times daily. For dry eyes     senna-docusate 8.6-50 MG tablet  Commonly known as:  Senokot-S  Take 2 tablets by mouth 2 (two) times daily.     traMADol-acetaminophen 37.5-325 MG tablet  Commonly known as:  ULTRACET  Take one tablet by mouth every four hours as needed for pain; Take two tablets by mouth every four hours as needed for severe pain        No orders of the defined types were placed in this encounter.    Immunization History  Administered Date(s) Administered  . PPD Test 08/12/2013  . Pneumococcal Polysaccharide-23 06/23/2013    Social History  Substance Use Topics  . Smoking status: Former Games developer  . Smokeless tobacco: Not on file  . Alcohol Use: No    Review of Systems  DATA OBTAINED: from nurse, pt doesn't speak [place]; nursing without concerns GENERAL:  no fevers, fatigue, appetite changes SKIN: No itching, rash HEENT: No  complaint RESPIRATORY: No cough, wheezing, SOB CARDIAC: No chest pain, palpitations, lower extremity edema  GI: No abdominal pain, No N/V/D or constipation, No heartburn or reflux  GU: No dysuria, frequency or urgency, or incontinence--treated for UTI several weeks  ago currently asymptomatic  MUSCULOSKELETAL: No unrelieved bone/joint pain NEUROLOGIC: No headache, dizziness  PSYCHIATRIC: No overt anxiety or sadness  Filed Vitals:   05/11/15 1140  BP: 132/74  Pulse: 80  Temp: 98.2 F (36.8 C)  Resp: 20    Physical Exam , GENERAL APPEARANCE: Alert, nonconversant, VFNo acute distress -lying comfortably in bed SKIN: No diaphoresis rash, or wounds HEENT: Unremarkable oropharynx clear mucous membranes moist RESPIRATORY: Breathing is even, unlabored. Lung sounds are clear   CARDIOVASCULAR: Heart RRR with an occasional irregular beat no murmurs, rubs or gallops. No peripheral edema  GASTROINTESTINAL: Abdomen is soft, non-tender, not distended w/ normal bowel sounds.  GENITOURINARY: Bladder non tender, not distended  MUSCULOSKELETAL: No abnormal joints or musculature NEUROLOGIC: Cranial nerves 2-12 grossly intact. Moves all extremities PSYCHIATRIC: dementia, no behavioral issues there are language barriers but she is able to follow simple verbal commands  Patient Active Problem List   Diagnosis Date Noted  . UTI (urinary tract infection) 03/24/2015  . GERD (gastroesophageal reflux disease) 12/17/2014  . Hypokalemia 12/17/2014  . Depression 11/25/2014  . Anemia of chronic disease 09/03/2014  . Chronic renal disease, stage 2, mildly decreased glomerular filtration rate (GFR) between 60-89 mL/min/1.73 square meter 09/03/2014  . Pain in joint, lower leg 08/21/2014  . Cough 01/27/2014  . Dizziness 01/12/2014  . Contusion of unspecified site 01/12/2014  . Lump in female breast 12/13/2013  . Lumbago 10/23/2013  . Type 2 diabetes, controlled, with neuropathy (HCC) 10/18/2013  .  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 Unspecified constipation 10/18/2013  . Plantar fasciitis, left 07/29/2013  . Physical deconditioning 07/21/2013  . Diabetes mellitus, controlled (HCC) 07/19/2013  . Chronic diastolic CHF (congestive heart failure) (HCC) 07/19/2013  . HCAP (healthcare-associated pneumonia) 07/18/2013  . Chronic diastolic heart failure (HCC) 06/24/2013  . Syncope 06/21/2013  . Syncope and collapse 06/21/2013  . Leukocytosis 06/21/2013  . Hypertensive heart disease with congestive heart failure (HCC) 06/21/2013  . Diabetes mellitus due to underlying condition (HCC) 06/21/2013    Labs.  01/21/2015.  Sodium 140 potassium 3.6 BUN 14 creatinine 0.7.  Liver function tests within normal limits.  Iron of 46.  Hemoglobin A1c 5.3.  Cholesterol 154-HDL 33.  LDL 103 triglycerides 89.  TSH-0.24  CBC    Component Value Date/Time   WBC 5.3 10/28/2014   WBC 4.7  07/22/2013 0420   RBC 3.56* 07/22/2013 0420   HGB 11.6* 10/28/2014   HCT 36 10/28/2014   PLT 173 10/28/2014   MCV 87.6 07/22/2013 0420   LYMPHSABS 1.1 07/18/2013 1910   MONOABS 0.9 07/18/2013 1910   EOSABS 0.1 07/18/2013 1910   BASOSABS 0.0 07/18/2013 1910    CMP     Component Value Date/Time   NA 140 08/22/2014   NA 137 07/22/2013 0420   K 4.1 08/22/2014   CL 97 07/22/2013 0420   CO2 29 07/22/2013 0420   GLUCOSE 98 07/22/2013 0420   BUN 13 08/22/2014   BUN 10 07/22/2013 0420   CREATININE 0.63 07/22/2013 0420   CALCIUM 8.7 07/22/2013 0420   PROT 7.4 07/18/2013 1910   ALBUMIN 3.1* 07/18/2013 1910   AST 13 07/18/2013 1910   ALT 9 07/18/2013 1910   ALKPHOS 84 07/18/2013 1910   BILITOT 0.8 07/18/2013 1910   GFRNONAA 83* 07/22/2013 0420   GFRAA >90 07/22/2013 0420    Assessment and Plan  1 history of chronic renal insufficiency this appears stable most recent creatinine 0.7 on 01/21/2015 will update a metabolic panel family is okay with  this.  #2 history of diastolic CHF she is on Lasix with potassium this is been stable for some time at this point continue to monitor update metabolic panel if possible.  #3 hypertension this is stable on Losartan  #4 some history of left leg pain in the past this appears stable x-rays and Dopplers have been negative this does not appear to Tawan progressing-she does have Ultracet every 4 hours as needed for pain.  #5-history of diabetes type 2-recent hemoglobin A1c was satisfactory will update this she is on Glucophage 500 mg daily.  #6 history of anemia chronic disease she continues on iron Will check a CBC--hemoglobin was 11.6 in May 2016.  #7-history depression this is stable on fluoxetine.  #8 previous history of low TSH will check a free T3-T4 and TSH   Again will update labs including a CBC BMP and TSH as well as T3 and T4 if family is okay with this  [id number]      ,

## 2015-05-11 NOTE — Progress Notes (Signed)
This encounter was created in error - please disregard.

## 2015-05-15 DIAGNOSIS — I5032 Chronic diastolic (congestive) heart failure: Secondary | ICD-10-CM | POA: Diagnosis not present

## 2015-05-22 DIAGNOSIS — I5032 Chronic diastolic (congestive) heart failure: Secondary | ICD-10-CM | POA: Diagnosis not present

## 2015-06-01 DIAGNOSIS — I5032 Chronic diastolic (congestive) heart failure: Secondary | ICD-10-CM | POA: Diagnosis not present

## 2015-06-03 ENCOUNTER — Other Ambulatory Visit: Payer: Self-pay | Admitting: *Deleted

## 2015-06-03 MED ORDER — TRAMADOL-ACETAMINOPHEN 37.5-325 MG PO TABS: ORAL_TABLET | ORAL | Status: DC

## 2015-06-03 NOTE — Telephone Encounter (Signed)
[facility] 

## 2015-06-10 DIAGNOSIS — I5032 Chronic diastolic (congestive) heart failure: Secondary | ICD-10-CM | POA: Diagnosis not present

## 2015-06-11 ENCOUNTER — Encounter: Payer: Self-pay | Admitting: Internal Medicine

## 2015-06-11 ENCOUNTER — Non-Acute Institutional Stay (SKILLED_NURSING_FACILITY): Payer: Medicare Other | Admitting: Internal Medicine

## 2015-06-11 DIAGNOSIS — E1140 Type 2 diabetes mellitus with diabetic neuropathy, unspecified: Secondary | ICD-10-CM

## 2015-06-11 DIAGNOSIS — I5032 Chronic diastolic (congestive) heart failure: Secondary | ICD-10-CM | POA: Diagnosis not present

## 2015-06-11 DIAGNOSIS — D638 Anemia in other chronic diseases classified elsewhere: Secondary | ICD-10-CM

## 2015-06-11 DIAGNOSIS — K219 Gastro-esophageal reflux disease without esophagitis: Secondary | ICD-10-CM

## 2015-06-11 NOTE — Progress Notes (Signed)
Patient ID: Margaret [name], female   DOB: 1931/10/21, 81 y.o.   MRN: [id number]  MRN: [id number] Name: Margaret [name]  Sex: female Age: 81 y.o. DOB: May 25, 1932  [facility]: [name] Facility/Room:213 Level Of Care: SNF Provider: Roena [name] Emergency Contacts: Extended Emergency Contact Information Primary Emergency Contact: Margaret [name] Address: [address]          [place], [place] [address] [name] of [place] Home Phone: [phone] Mobile Phone: [phone] Relation: Sister Secondary Emergency Contact: Margaret [name] Address: [address]          [place], [place] [address] [name] of [place] Home Phone: [phone] Relation: Grandaughter  Code Status:   Allergies: Tequin  Chief Complaint  Patient presents with  . Medical Management of Chronic Issues   including renal insufficiency-anemia of chronic disease-hypertension-diabetes type 2-depression--CHF-dementia  HPI: Patient is 81 y.o. female with the above conditions-she appears to Margaret [name] quite stable she ambulates at times about the facility in a walker appears to Margaret [name] doing well she does complain of left leg pain at times but previous studies including Doppler ultrasounds and x-rays have been negative this appears to Margaret [name] stable.  She has no acute complaints today I am seeing her for routine medical follow-up  Apparently she fell recently and did sustain a hematoma to the left temple area-however she is at her baseline continues to ambulate about the facility-she does have what appears to Margaret [name] a resolving hematoma left temple area.  She does have a history of hypertension she is on valsartan this appears to Margaret [name] stable recent blood pressure 134/81--113/81-129/70  Her diabetes continues to Margaret [name] well controlled on Glucophage  's and hemoglobin A1c was 5.5.  She did have a history of mild hypokalemia so we increased her potassium she is now on 30 mEq a day and this has normalized up to 4.2 on most recent lab      Past  Medical History  Diagnosis Date  . Diabetes mellitus without complication (HCC)   . Hypertension   . Diabetes mellitus   . Gout   . Chronic diastolic heart failure [name])     Past Surgical History  Procedure Laterality Date  . No past surgeries        Medication List       This list is accurate as of: 06/11/15 11:59 PM.  Always use your most recent med list.               acetaminophen 325 MG tablet  Commonly known as:  TYLENOL  Take 650 mg by mouth every 6 (six) hours as needed.     aspirin 81 MG chewable tablet  Chew 1 tablet (81 mg total) by mouth daily.     FLUoxetine 10 MG tablet  Commonly known as:  PROZAC  Take 10 mg by mouth daily. For depression     furosemide 40 MG tablet  Commonly known as:  LASIX  Take 40 mg by mouth every morning.     gabapentin 300 MG capsule  Commonly known as:  NEURONTIN  Take 1 capsule (300 mg total) by mouth at bedtime.     losartan 25 MG tablet  Commonly known as:  COZAAR  Take 25 mg by mouth every morning. For HTN     metFORMIN 500 MG 24 hr tablet  Commonly known as:  GLUCOPHAGE-XR  Take 500 mg by mouth every evening. For diabetes     omeprazole 20 MG capsule  Commonly known as:  PRILOSEC  Take 20 mg by mouth daily. For GERD     potassium chloride 20 MEQ packet  Commonly known as:  KLOR-CON  Take 20 mEq by mouth daily. For K+ supplement     Propylene Glycol 0.6 % Soln  Apply 1 drop to eye 3 (three) times daily. For dry eyes     senna-docusate 8.6-50 MG tablet  Commonly known as:  Senokot-S  Take 2 tablets by mouth 2 (two) times daily.     traMADol-acetaminophen 37.5-325 MG tablet  Commonly known as:  ULTRACET  Take one tablet by mouth every 4 hours as needed for pain       Of note potassium is now 30 mEq a day     Immunization History  Administered Date(s) Administered  . PPD Test 08/12/2013  . Pneumococcal Polysaccharide-23 06/23/2013    Social History  Substance Use Topics  . Smoking status:  Former Games developer  . Smokeless tobacco: Not on file  . Alcohol Use: No    Review of Systems  DATA OBTAINED: from nurse, pt doesn't speak [place]; nursing without concerns GENERAL:  no fevers, fatigue, appetite changes SKIN: No itching, rash HEENT: No complaint RESPIRATORY: No cough, wheezing, SOB CARDIAC: No chest pain, palpitations, lower extremity edema  GI: No abdominal pain, No N/V/D or constipation, No heartburn or reflux  GU: No dysuria, frequency or urgency, or incontinenc MUSCULOSKELETAL: No unrelieved bone/joint pain--recent fall with no apparent injuries except a hematoma to left temple area that appears to Margaret [name] resolving NEUROLOGIC: No headache, dizziness  PSYCHIATRIC: No overt anxiety or sadness  Filed Vitals:   06/11/15 1925  BP: 134/81  Pulse: 82  Temp: 97.7 [name] (36.5 C)  Resp: 18    Physical Exam , GENERAL APPEARANCE: Alert, nonconversant, No acute distress -lying comfortably in bed SKIN: No diaphoresis rash, or wounds--she does have a brawny appearing hematoma left temple area that appears to Margaret [name] resolving HEENT: Unremarkable oropharynx clear mucous membranes moist sclera and conjunctiva are clear pupils appear reactive to light visual acuity appears grossly intact RESPIRATORY: Breathing is even, unlabored. Lung sounds are clear   CARDIOVASCULAR: Heart RRR w no murmurs, rubs or gallops. No peripheral edema  GASTROINTESTINAL: Abdomen is soft, non-tender, not distended w/ normal bowel sounds.  GENITOURINARY: Bladder non tender, not distended  MUSCULOSKELETAL: No abnormal joints or musculature NEUROLOGIC: Cranial nerves 2-12 grossly intact. Moves all extremities PSYCHIATRIC: dementia, no behavioral issues there are language barriers but she is able to follow simple verbal commands  Patient Active Problem List   Diagnosis Date Noted  . UTI (urinary tract infection) 03/24/2015  . GERD (gastroesophageal reflux disease) 12/17/2014  . Hypokalemia 12/17/2014  . Depression  11/25/2014  . Anemia of chronic disease 09/03/2014  . Chronic renal disease, stage 2, mildly decreased glomerular filtration rate (GFR) between 60-89 mL/min/1.73 square meter 09/03/2014  . Pain in joint, lower leg 08/21/2014  . Cough 01/27/2014  . Dizziness 01/12/2014  . Contusion of unspecified site 01/12/2014  . Lump in female breast 12/13/2013  . Lumbago 10/23/2013  . Type 2 diabetes, controlled, with neuropathy (HCC) 10/18/2013  . 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 Unspecified constipation 10/18/2013  . Plantar fasciitis, left 07/29/2013  . Physical deconditioning 07/21/2013  . Diabetes mellitus, controlled (HCC) 07/19/2013  . Chronic diastolic CHF (congestive heart failure) (HCC) 07/19/2013  . HCAP (healthcare-associated pneumonia) 07/18/2013  . Chronic diastolic heart failure (HCC) 06/24/2013  . Syncope 06/21/2013  . Syncope and collapse  06/21/2013  . Leukocytosis 06/21/2013  . Hypertensive heart disease with congestive heart failure (HCC) 06/21/2013  . Diabetes mellitus due to underlying condition (HCC) 06/21/2013    Labs.  03/11/2015.  Sodium 143 potassium 4.2 BUN 17 creatinine 0.9 CO2 34.  Albumin 3.3 otherwise liver function tests within normal limits.  I do note on November 18 potassium was 3.4 this has since normalized.  Hemoglobin A1c 5.5-T3 3 0.0-T4 0.97  01/21/2015.  Sodium 140 potassium 3.6 BUN 14 creatinine 0.7.  Liver function tests within normal limits.  Iron of 46.  Hemoglobin A1c 5.3.  Cholesterol 154-HDL 33.  LDL 103 triglycerides 89.  TSH-0.24  CBC    Component Value Date/Time   WBC 5.3 10/28/2014   WBC 4.7 07/22/2013 0420   RBC 3.56* 07/22/2013 0420   HGB 11.6* 10/28/2014   HCT 36 10/28/2014   PLT 173 10/28/2014   MCV 87.6 07/22/2013 0420   LYMPHSABS 1.1 07/18/2013 1910   MONOABS 0.9 07/18/2013 1910   EOSABS 0.1 07/18/2013 1910   BASOSABS 0.0 07/18/2013 1910    CMP      Component Value Date/Time   NA 140 08/22/2014   NA 137 07/22/2013 0420   K 4.1 08/22/2014   CL 97 07/22/2013 0420   CO2 29 07/22/2013 0420   GLUCOSE 98 07/22/2013 0420   BUN 13 08/22/2014   BUN 10 07/22/2013 0420   CREATININE 0.63 07/22/2013 0420   CALCIUM 8.7 07/22/2013 0420   PROT 7.4 07/18/2013 1910   ALBUMIN 3.1* 07/18/2013 1910   AST 13 07/18/2013 1910   ALT 9 07/18/2013 1910   ALKPHOS 84 07/18/2013 1910   BILITOT 0.8 07/18/2013 1910   GFRNONAA 83* 07/22/2013 0420   GFRAA >90 07/22/2013 0420    Assessment and Plan  1 history of chronic renal insufficiency this appears stable most recent creatinine 0.9 on  On lab done does 12/14/ 2016  #2 history of diastolic CHF she is on Lasix with potassium this is been stable for some time at this point continue to monitor --potassium was slightly low but this has normalized on increased supplementation-30 mEq daily  #3 hypertension this is stable on Losartan as noted above  #4 some history of left leg pain in the past this appears stable x-rays and Dopplers have been negative this does not appear to [place] progressing-she does have Ultracet every 4 hours as needed for pain.  #5-history of diabetes type 2-recent hemoglobin A1c was satisfactory at 5.5 she is on Glucophage 500 mg daily.  #6 history of anemia chronic disease she continues on iron -hemoglobin on 05/04/2015 was stable at 12.0  #7-history depression this is stable on fluoxetine.  #8 previous history of low TSH -- T3 level was 3.0 T4 0.97 on recent lab  #9 history of neuropathy she is on Neurontin 3 mg daily at bedtime this appears to Olga helping with what appears to Anhar neuropathic pain.  #10 history of GERD this appears relatively asymptomatic on Prilosec    [id number]  A       ,

## 2015-06-11 NOTE — Progress Notes (Signed)
This encounter was created in error - please disregard.

## 2015-07-03 ENCOUNTER — Non-Acute Institutional Stay (SKILLED_NURSING_FACILITY): Payer: Medicare Other | Admitting: Internal Medicine

## 2015-07-03 ENCOUNTER — Encounter: Payer: Self-pay | Admitting: Internal Medicine

## 2015-07-03 DIAGNOSIS — I110 Hypertensive heart disease with heart failure: Secondary | ICD-10-CM | POA: Diagnosis not present

## 2015-07-03 NOTE — Progress Notes (Signed)
MRN: [id number] Name: Margaret [name]  Sex: female Age: 82 y.o. DOB: August 14, 1931  [facility] #: [name] Facility/Room: Level Of Care: SNF Provider: Merrilee Seashore D Emergency Contacts: Extended Emergency Contact Information Primary Emergency Contact: Slayton,[name] Address: [address]          [place], [place] [address] [name] of [place] Home Phone: [phone] Mobile Phone: [phone] Relation: Sister Secondary Emergency Contact: [name],[name] Address: [address]          [place], [place] [address] [name] of [place] Home Phone: [phone] Relation: Grandaughter  Code Status:   Allergies: Tequin  Chief Complaint  Patient presents with  . Acute Visit    HPI: Patient is 82 y.o. female with DM2, HTN, CdHF who nursing asked me to see for irritation to L eye. Onset yesterday, some small pain, no UTI sx, no fever, saline eye drops have not improved. Pt will also Detrice seen for routine issue of HTN.   Past Medical History  Diagnosis Date  . Diabetes mellitus without complication (HCC)   . Hypertension   . Diabetes mellitus   . Gout   . Chronic diastolic heart failure [facility])     Past Surgical History  Procedure Laterality Date  . No past surgeries        Medication List       This list is accurate as of: 07/03/15 11:59 PM.  Always use your most recent med list.               acetaminophen 325 MG tablet  Commonly known as:  TYLENOL  Take 650 mg by mouth every 6 (six) hours as needed.     aspirin 81 MG chewable tablet  Chew 1 tablet (81 mg total) by mouth daily.     FLUoxetine 10 MG tablet  Commonly known as:  PROZAC  Take 10 mg by mouth daily. For depression     furosemide 40 MG tablet  Commonly known as:  LASIX  Take 40 mg by mouth every morning.     gabapentin 300 MG capsule  Commonly known as:  NEURONTIN  Take 1 capsule (300 mg total) by mouth at bedtime.     losartan 25 MG tablet  Commonly known as:  COZAAR  Take 25 mg by mouth every  morning. For HTN     metFORMIN 500 MG 24 hr tablet  Commonly known as:  GLUCOPHAGE-XR  Take 500 mg by mouth every evening. For diabetes     omeprazole 20 MG capsule  Commonly known as:  PRILOSEC  Take 20 mg by mouth daily. For GERD     potassium chloride 20 MEQ packet  Commonly known as:  KLOR-CON  Take 20 mEq by mouth daily. For K+ supplement     Propylene Glycol 0.6 % Soln  Apply 1 drop to eye 3 (three) times daily. For dry eyes     senna-docusate 8.6-50 MG tablet  Commonly known as:  Senokot-S  Take 2 tablets by mouth 2 (two) times daily.     traMADol-acetaminophen 37.5-325 MG tablet  Commonly known as:  ULTRACET  Take one tablet by mouth every 4 hours as needed for pain        No orders of the defined types were placed in this encounter.    Immunization History  Administered Date(s) Administered  . PPD Test 08/12/2013  . Pneumococcal Polysaccharide-23 06/23/2013    Social History  Substance Use Topics  . Smoking status: Former Games developer  .  Smokeless tobacco: Not on file  . Alcohol Use: No    Review of Systems  DATA OBTAINED: from patient, nurse GENERAL:  no fevers, fatigue, appetite changes SKIN: No itching, rash HEENT: as in HPI RESPIRATORY: No cough, wheezing, SOB CARDIAC: No chest pain, palpitations, lower extremity edema  GI: No abdominal pain, No N/V/D or constipation, No heartburn or reflux  GU: No dysuria, frequency or urgency, or incontinence  MUSCULOSKELETAL: No unrelieved bone/joint pain NEUROLOGIC: No headache, dizziness  PSYCHIATRIC: No overt anxiety or sadness  Filed Vitals:   07/03/15 1239  BP: 125/73  Pulse: 70  Temp: 98.3 F (36.8 C)  Resp: 18    Physical Exam  GENERAL APPEARANCE: Alert, conversant, No acute distress , [place] F  SKIN: No diaphoresis rash HEENT: L eye with mild conjunctival injection, mild cloudy d/c at medial canthus, no swelling to lids RESPIRATORY: Breathing is even, unlabored. Lung sounds are clear    CARDIOVASCULAR: Heart RRR no murmurs, rubs or gallops. No peripheral edema  GASTROINTESTINAL: Abdomen is soft, non-tender, not distended w/ normal bowel sounds.  GENITOURINARY: Bladder non tender, not distended  MUSCULOSKELETAL: No abnormal joints or musculature NEUROLOGIC: Cranial nerves 2-12 grossly intact. Moves all extremities PSYCHIATRIC: Mood and affect appropriate to situation, no behavioral issues  Patient Active Problem List   Diagnosis Date Noted  . UTI (urinary tract infection) 03/24/2015  . GERD (gastroesophageal reflux disease) 12/17/2014  . Hypokalemia 12/17/2014  . Depression 11/25/2014  . Anemia of chronic disease 09/03/2014  . Chronic renal disease, stage 2, mildly decreased glomerular filtration rate (GFR) between 60-89 mL/min/1.73 square meter 09/03/2014  . Pain in joint, lower leg 08/21/2014  . Cough 01/27/2014  . Dizziness 01/12/2014  . Contusion of unspecified site 01/12/2014  . Lump in female breast 12/13/2013  . Lumbago 10/23/2013  . Type 2 diabetes, controlled, with neuropathy (HCC) 10/18/2013  . 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 Unspecified constipation 10/18/2013  . Plantar fasciitis, left 07/29/2013  . Physical deconditioning 07/21/2013  . Diabetes mellitus, controlled (HCC) 07/19/2013  . Chronic diastolic CHF (congestive heart failure) (HCC) 07/19/2013  . HCAP (healthcare-associated pneumonia) 07/18/2013  . Chronic diastolic heart failure (HCC) 06/24/2013  . Syncope 06/21/2013  . Syncope and collapse 06/21/2013  . Leukocytosis 06/21/2013  . Hypertensive heart disease with congestive heart failure (HCC) 06/21/2013  . Diabetes mellitus due to underlying condition (HCC) 06/21/2013    CBC    Component Value Date/Time   WBC 5.3 10/28/2014   WBC 4.7 07/22/2013 0420   RBC 3.56* 07/22/2013 0420   HGB 11.6* 10/28/2014   HCT 36 10/28/2014   PLT 173 10/28/2014   MCV 87.6 07/22/2013 0420   LYMPHSABS 1.1  07/18/2013 1910   MONOABS 0.9 07/18/2013 1910   EOSABS 0.1 07/18/2013 1910   BASOSABS 0.0 07/18/2013 1910    CMP     Component Value Date/Time   NA 140 08/22/2014   NA 137 07/22/2013 0420   K 4.1 08/22/2014   CL 97 07/22/2013 0420   CO2 29 07/22/2013 0420   GLUCOSE 98 07/22/2013 0420   BUN 13 08/22/2014   BUN 10 07/22/2013 0420   CREATININE 0.63 07/22/2013 0420   CALCIUM 8.7 07/22/2013 0420   PROT 7.4 07/18/2013 1910   ALBUMIN 3.1* 07/18/2013 1910   AST 13 07/18/2013 1910   ALT 9 07/18/2013 1910   ALKPHOS 84 07/18/2013 1910   BILITOT 0.8 07/18/2013 1910   GFRNONAA 83* 07/22/2013 0420   GFRAA >  90 07/22/2013 0420    Assessment and Plan   CONJUNCTIVITIS L EYE - garamycin eye drops QID until resolution  Hypertensive heart disease with congestive heart failure Well controlled on Losatan 25 mg and lasix 40 mg; plan - cont current meds    [name], MD

## 2015-07-04 ENCOUNTER — Encounter: Payer: Self-pay | Admitting: Internal Medicine

## 2015-07-04 NOTE — Assessment & Plan Note (Signed)
Well controlled on Losatan 25 mg and lasix 40 mg; plan - cont current meds

## 2015-07-09 ENCOUNTER — Non-Acute Institutional Stay (SKILLED_NURSING_FACILITY): Payer: Medicare Other | Admitting: Internal Medicine

## 2015-07-09 ENCOUNTER — Encounter: Payer: Self-pay | Admitting: Internal Medicine

## 2015-07-09 DIAGNOSIS — I5032 Chronic diastolic (congestive) heart failure: Secondary | ICD-10-CM | POA: Diagnosis not present

## 2015-07-09 DIAGNOSIS — E1140 Type 2 diabetes mellitus with diabetic neuropathy, unspecified: Secondary | ICD-10-CM

## 2015-07-09 DIAGNOSIS — N182 Chronic kidney disease, stage 2 (mild): Secondary | ICD-10-CM

## 2015-07-09 DIAGNOSIS — H109 Unspecified conjunctivitis: Secondary | ICD-10-CM | POA: Diagnosis not present

## 2015-07-09 DIAGNOSIS — M79661 Pain in right lower leg: Secondary | ICD-10-CM | POA: Diagnosis not present

## 2015-07-09 NOTE — Progress Notes (Signed)
This encounter was created in error - please disregard.

## 2015-07-09 NOTE — Progress Notes (Signed)
Patient ID: Margaret [name], female   DOB: 05-17-1932, 82 y.o.   MRN: [id number]   MRN: [id number] Name: Margaret [name]  Sex: female Age: 82 y.o. DOB: [birth year]  [name] Facility/Room:[id number] Level Of Care: SNF Provider: Roena Malady Emergency Contacts: Extended Emergency Contact Information Primary Emergency Contact: Smart,[name] Address: [address]          [place], [place] [address] [name] of [place] Home Phone: [phone] Mobile Phone: [phone] Relation: Sister Secondary Emergency Contact: [name],[name] Address: [address]          [place], [place] [address] [name] of [place] Home Phone: [phone] Relation: Grandaughter  Code Status:   Allergies: Tequin  Chief Complaint  Patient presents with  . Medical Management of Chronic Issues   including renal insufficiency-anemia of chronic disease--diabetes type 2-depression--CHF-dementia  HPI: Patient is 82 y.o. female with the above conditions-she appears to [place] quite stable she ambulates at times about the facility in a walker appears to Myriam doing well she does complain of left leg pain at times but previous studies including Doppler ultrasounds and x-rays have been negative this appears to Babe stable.  does complain it appears of some right lower leg pain today  Dr. Lyn Hollingshead did recently see her for conjunctivitis of the left eye in this appears to have cleared up on the gentamicin eyedrops     She does have a history of hypertension she is on valsartan this appears to Ameliyah stable recent blood pressure 130/78-130/80-128/80  Her diabetes continues to Tiane well controlled on Glucophage  hemoglobin A1c was 5.5 last November.  She did have a history of mild hypokalemia so we increased her potassium she is now on 30 mEq a day and this has normalized up to 4.2 on most recent lab      Past Medical History  Diagnosis Date  . Diabetes mellitus without complication (HCC)   . Hypertension   . Diabetes  mellitus   . Gout   . Chronic diastolic heart failure [facility])     Past Surgical History  Procedure Laterality Date  . No past surgeries        Medication List       This list is accurate as of: 07/09/15 11:59 PM.  Always use your most recent med list.               acetaminophen 325 MG tablet  Commonly known as:  TYLENOL  Take 650 mg by mouth every 6 (six) hours as needed.     aspirin 81 MG chewable tablet  Chew 1 tablet (81 mg total) by mouth daily.     FLUoxetine 10 MG tablet  Commonly known as:  PROZAC  Take 10 mg by mouth daily. For depression     furosemide 40 MG tablet  Commonly known as:  LASIX  Take 40 mg by mouth every morning.     gabapentin 300 MG capsule  Commonly known as:  NEURONTIN  Take 1 capsule (300 mg total) by mouth at bedtime.     losartan 25 MG tablet  Commonly known as:  COZAAR  Take 25 mg by mouth every morning. For HTN     metFORMIN 500 MG 24 hr tablet  Commonly known as:  GLUCOPHAGE-XR  Take 500 mg by mouth every evening. For diabetes     omeprazole 20 MG capsule  Commonly known as:  PRILOSEC  Take 20 mg by mouth daily. For GERD  potassium chloride 20 MEQ packet  Commonly known as:  KLOR-CON  Take 30 mEq by mouth daily. For K+ supplement     Propylene Glycol 0.6 % Soln  Apply 1 drop to eye 3 (three) times daily. For dry eyes     senna-docusate 8.6-50 MG tablet  Commonly known as:  Senokot-S  Take 2 tablets by mouth 2 (two) times daily.     traMADol-acetaminophen 37.5-325 MG tablet  Commonly known as:  ULTRACET  Take one tablet by mouth every 4 hours as needed for pain       Of note potassium is now 30 mEq a day     Immunization History  Administered Date(s) Administered  . PPD Test 08/12/2013  . Pneumococcal Polysaccharide-23 06/23/2013    Social History  Substance Use Topics  . Smoking status: Former Games developer  . Smokeless tobacco: Not on file  . Alcohol Use: No    Review of Systems  DATA OBTAINED: from  nurse, pt doesn't speak [place]; nursing without concerns-- GENERAL:  no fevers, fatigue, appetite changes SKIN: No itching, rash HEENT: No complaint--conjunctivitis appears resolved RESPIRATORY: No cough, wheezing, SOB CARDIAC: No chest pain, palpitations, lower extremity edema  GI: No abdominal pain, No N/V/D or constipation, No heartburn or reflux  GU: No dysuria, frequency or urgency, or incontinenc MUSCULOSKELETAL: No unrelieved bone/joint pain-- Question some right lower leg discomfort NEUROLOGIC: No headache, dizziness  PSYCHIATRIC: No overt anxiety or sadness  Filed Vitals:   07/09/15 2126  BP: 130/78  Pulse: 80  Temp: 98.8 F (37.1 C)  Resp: 18    Physical Exam , GENERAL APPEARANCE: Alert, nonconversant, No acute distress - SKIN: No diaphoresis rash, or wounds-  HEENT: Unremarkable oropharynx clear mucous membranes moist sclera and conjunctiva are clear pupils appear reactive to light visual acuity appears grossly intact RESPIRATORY: Breathing is even, unlabored. Lung sounds are clear   CARDIOVASCULAR: Heart RRR w no murmurs, rubs or gallops. No peripheral edema  GASTROINTESTINAL: Abdomen is soft, non-tender, not distended w/ normal bowel sounds.  GENITOURINARY: Bladder non tender, not distended  MUSCULOSKELETAL: No abnormal joints or musculature does point to her lower right leg however--I did not note any deformity or tenderness to palpation--continues to ambulate with her walker NEUROLOGIC: Cranial nerves 2-12 grossly intact. Moves all extremities PSYCHIATRIC: dementia, no behavioral issues there are language barriers but she is able to follow simple verbal commands  Patient Active Problem List   Diagnosis Date Noted  . Conjunctivitis 07/09/2015  . UTI (urinary tract infection) 03/24/2015  . GERD (gastroesophageal reflux disease) 12/17/2014  . Hypokalemia 12/17/2014  . Depression 11/25/2014  . Anemia of chronic disease 09/03/2014  . Chronic renal disease, stage  2, mildly decreased glomerular filtration rate (GFR) between 60-89 mL/min/1.73 square meter 09/03/2014  . Pain in joint, lower leg 08/21/2014  . Cough 01/27/2014  . Dizziness 01/12/2014  . Contusion of unspecified site 01/12/2014  . Lump in female breast 12/13/2013  . Lumbago 10/23/2013  . Type 2 diabetes, controlled, with neuropathy (HCC) 10/18/2013  . 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 Unspecified constipation 10/18/2013  . Plantar fasciitis, left 07/29/2013  . Physical deconditioning 07/21/2013  . Diabetes mellitus, controlled (HCC) 07/19/2013  . Chronic diastolic CHF (congestive heart failure) (HCC) 07/19/2013  . HCAP (healthcare-associated pneumonia) 07/18/2013  . Chronic diastolic heart failure (HCC) 06/24/2013  . Syncope 06/21/2013  . Syncope and collapse 06/21/2013  . Leukocytosis 06/21/2013  . Hypertensive heart disease with congestive heart  failure (HCC) 06/21/2013  . Diabetes mellitus due to underlying condition (HCC) 06/21/2013    Labs.  06/10/2015.  Sodium 143 potassium 4.2 BUN 17 creatinine 0.9  03/11/2015.  Sodium 143 potassium 4.2 BUN 17 creatinine 0.9 CO2 34.  Albumin 3.3 otherwise liver function tests within normal limits.  I do note on November 18 potassium was 3.4 this has since normalized.  Hemoglobin A1c 5.5-T3 3 0.0-T4 0.97  01/21/2015.  Sodium 140 potassium 3.6 BUN 14 creatinine 0.7.  Liver function tests within normal limits.  Iron of 46.  Hemoglobin A1c 5.3.  Cholesterol 154-HDL 33.  LDL 103 triglycerides 89.  TSH-0.24  CBC    Component Value Date/Time   WBC 5.3 10/28/2014   WBC 4.7 07/22/2013 0420   RBC 3.56* 07/22/2013 0420   HGB 11.6* 10/28/2014   HCT 36 10/28/2014   PLT 173 10/28/2014   MCV 87.6 07/22/2013 0420   LYMPHSABS 1.1 07/18/2013 1910   MONOABS 0.9 07/18/2013 1910   EOSABS 0.1 07/18/2013 1910   BASOSABS 0.0 07/18/2013 1910    CMP     Component Value Date/Time    NA 140 08/22/2014   NA 137 07/22/2013 0420   K 4.1 08/22/2014   CL 97 07/22/2013 0420   CO2 29 07/22/2013 0420   GLUCOSE 98 07/22/2013 0420   BUN 13 08/22/2014   BUN 10 07/22/2013 0420   CREATININE 0.63 07/22/2013 0420   CALCIUM 8.7 07/22/2013 0420   PROT 7.4 07/18/2013 1910   ALBUMIN 3.1* 07/18/2013 1910   AST 13 07/18/2013 1910   ALT 9 07/18/2013 1910   ALKPHOS 84 07/18/2013 1910   BILITOT 0.8 07/18/2013 1910   GFRNONAA 83* 07/22/2013 0420   GFRAA >90 07/22/2013 0420    Assessment and Plan  1 history of chronic renal insufficiency this appears stable most recent creatinine 0.9 on  On lab done does 12/14/ 2016--we will update this family allows-family has been somewhat conservative in regards to lab draws in the past  #2 history of diastolic CHF she is on Lasix with potassium this is been stable for some time at this point continue to monitor --potassium was slightly low but this has normalized on increased supplementation-30 mEq daily  #3 hypertension this is stable on Losartan as noted above  #4 some history of left leg pain in the past this appears stable x-rays and Dopplers have been negative this does not appear to Anwar progressing-she does have Ultracet every 4 hours as needed for pain. ------ possibly some right lower leg pain Will obtain an x-ray  #5-history of diabetes type 2-recent hemoglobin A1c was satisfactory at 5.5 she is on Glucophage 500 mg daily.  #6 history of anemia chronic disease she continues on iron -hemoglobin on 05/04/2015 was stable at 12.0-will update  #7-history depression this is stable on fluoxetine.  #8 previous history of low TSH -- T3 level was 3.0 T4 0.97 on recent lab--TSH was 0.43  #9 history of neuropathy she is on Neurontin 300 mg daily at bedtime this appears to Joanell helping with what appears to Jaryn neuropathic pain.  #10 history of GERD this appears relatively asymptomatic on Prilosec  #11 conjunctivitis this appears essentially  resolved she has been on gentamicin eyedrops    [id number]        ,

## 2015-07-10 DIAGNOSIS — I10 Essential (primary) hypertension: Secondary | ICD-10-CM | POA: Diagnosis not present

## 2015-07-10 LAB — CBC AND DIFFERENTIAL
HEMATOCRIT: 37 % (ref 36–46)
Hemoglobin: 12.3 g/dL (ref 12.0–16.0)
PLATELETS: 175 10*3/uL (ref 150–399)
WBC: 4.2 10^3/mL

## 2015-07-10 LAB — BASIC METABOLIC PANEL
BUN: 16 mg/dL (ref 4–21)
Creatinine: 0.9 mg/dL (ref 0.5–1.1)
Glucose: 158 mg/dL
Potassium: 4 mmol/L (ref 3.4–5.3)
Sodium: 138 mmol/L (ref 137–147)

## 2015-07-11 ENCOUNTER — Encounter: Payer: Self-pay | Admitting: Internal Medicine

## 2015-07-11 NOTE — Progress Notes (Signed)
This encounter was created in error - please disregard.

## 2015-07-14 DIAGNOSIS — E08311 Diabetes mellitus due to underlying condition with unspecified diabetic retinopathy with macular edema: Secondary | ICD-10-CM | POA: Diagnosis not present

## 2015-07-14 DIAGNOSIS — E038 Other specified hypothyroidism: Secondary | ICD-10-CM | POA: Diagnosis not present

## 2015-07-14 LAB — HEMOGLOBIN A1C: Hemoglobin A1C: 5.3

## 2015-07-14 LAB — TSH: TSH: 0.42 u[IU]/mL (ref 0.41–5.90)

## 2015-08-07 ENCOUNTER — Encounter: Payer: Self-pay | Admitting: Internal Medicine

## 2015-08-07 ENCOUNTER — Non-Acute Institutional Stay (SKILLED_NURSING_FACILITY): Payer: Medicare Other | Admitting: Internal Medicine

## 2015-08-07 DIAGNOSIS — E1142 Type 2 diabetes mellitus with diabetic polyneuropathy: Secondary | ICD-10-CM | POA: Diagnosis not present

## 2015-08-07 DIAGNOSIS — K219 Gastro-esophageal reflux disease without esophagitis: Secondary | ICD-10-CM

## 2015-08-07 DIAGNOSIS — E1140 Type 2 diabetes mellitus with diabetic neuropathy, unspecified: Secondary | ICD-10-CM | POA: Diagnosis not present

## 2015-08-07 NOTE — Progress Notes (Signed)
MRN: [id number] Name: Margaret [name]  Sex: female Age: 82 y.o. DOB: 20-May-1932  [name] Facility/Room: Level Of Care: SNF Provider: Merrilee Seashore D Emergency Contacts: Extended Emergency Contact Information Primary Emergency Contact: Westby,[name] Address: [address]          [place], [place] [address] [name] of [place] Home Phone: [phone] Mobile Phone: [phone] Relation: Sister Secondary Emergency Contact: [name],[name] Address: [address]          [place], [place] [address] [name] of [place] Home Phone: [phone] Relation: Grandaughter  Code Status:   Allergies: Tequin  Chief Complaint  Patient presents with  . Medical Management of Chronic Issues    HPI: Patient is 82 y.o. female who is being seen for DM2, diabetic neuropathy and GERD.  Past Medical History  Diagnosis Date  . Diabetes mellitus without complication (HCC)   . Hypertension   . Diabetes mellitus   . Gout   . Chronic diastolic heart failure [facility])     Past Surgical History  Procedure Laterality Date  . No past surgeries        Medication List       This list is accurate as of: 08/07/15 11:59 PM.  Always use your most recent med list.               acetaminophen 325 MG tablet  Commonly known as:  TYLENOL  Take 650 mg by mouth every 6 (six) hours as needed.     aspirin 81 MG chewable tablet  Chew 1 tablet (81 mg total) by mouth daily.     FLUoxetine 10 MG tablet  Commonly known as:  PROZAC  Take 10 mg by mouth daily. For depression     furosemide 40 MG tablet  Commonly known as:  LASIX  Take 40 mg by mouth every morning.     gabapentin 300 MG capsule  Commonly known as:  NEURONTIN  Take 1 capsule (300 mg total) by mouth at bedtime.     losartan 25 MG tablet  Commonly known as:  COZAAR  Take 25 mg by mouth every morning. For HTN     metFORMIN 500 MG 24 hr tablet  Commonly known as:  GLUCOPHAGE-XR  Take 500 mg by mouth every evening. For  diabetes     omeprazole 20 MG capsule  Commonly known as:  PRILOSEC  Take 20 mg by mouth daily. For GERD     potassium chloride 20 MEQ packet  Commonly known as:  KLOR-CON  Take 30 mEq by mouth daily. For K+ supplement     Propylene Glycol 0.6 % Soln  Apply 1 drop to eye 3 (three) times daily. For dry eyes     senna-docusate 8.6-50 MG tablet  Commonly known as:  Senokot-S  Take 2 tablets by mouth 2 (two) times daily.     traMADol-acetaminophen 37.5-325 MG tablet  Commonly known as:  ULTRACET  Take one tablet by mouth every 4 hours as needed for pain        No orders of the defined types were placed in this encounter.    Immunization History  Administered Date(s) Administered  . PPD Test 08/12/2013  . Pneumococcal Polysaccharide-23 06/23/2013    Social History  Substance Use Topics  . Smoking status: Former Games developer  . Smokeless tobacco: Not on file  . Alcohol Use: No    Review of Systems  DATA OBTAINED: from patient, nurse GENERAL:  no fevers, fatigue, appetite changes  SKIN: No itching, rash HEENT: No complaint RESPIRATORY: No cough, wheezing, SOB CARDIAC: No chest pain, palpitations, lower extremity edema  GI: No abdominal pain, No N/V/D or constipation, No heartburn or reflux  GU: No dysuria, frequency or urgency, or incontinence  MUSCULOSKELETAL: No unrelieved bone/joint pain NEUROLOGIC: No headache, dizziness  PSYCHIATRIC: No overt anxiety or sadness  Filed Vitals:   08/07/15 1551  BP: 130/68  Pulse: 82  Temp: 98.1 F (36.7 C)  Resp: 18    Physical Exam  GENERAL APPEARANCE: Alert,  No acute distress  SKIN: No diaphoresis rash HEENT: Unremarkable RESPIRATORY: Breathing is even, unlabored. Lung sounds are clear   CARDIOVASCULAR: Heart RRR no murmurs, rubs or gallops. No peripheral edema  GASTROINTESTINAL: Abdomen is soft, non-tender, not distended w/ normal bowel sounds.  GENITOURINARY: Bladder non tender, not distended  MUSCULOSKELETAL: No  abnormal joints or musculature NEUROLOGIC: Cranial nerves 2-12 grossly intact. Moves all extremities PSYCHIATRIC: Mood and affect appropriate to situation, no behavioral issues  Patient Active Problem List   Diagnosis Date Noted  . Conjunctivitis 07/09/2015  . UTI (urinary tract infection) 03/24/2015  . GERD (gastroesophageal reflux disease) 12/17/2014  . Hypokalemia 12/17/2014  . Depression 11/25/2014  . Anemia of chronic disease 09/03/2014  . Chronic renal disease, stage 2, mildly decreased glomerular filtration rate (GFR) between 60-89 mL/min/1.73 square meter 09/03/2014  . Pain in joint, lower leg 08/21/2014  . Cough 01/27/2014  . Dizziness 01/12/2014  . Contusion of unspecified site 01/12/2014  . Lump in female breast 12/13/2013  . Lumbago 10/23/2013  . Type 2 diabetes, controlled, with neuropathy (HCC) 10/18/2013  . 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 Unspecified constipation 10/18/2013  . Plantar fasciitis, left 07/29/2013  . Physical deconditioning 07/21/2013  . Diabetes mellitus, controlled (HCC) 07/19/2013  . Chronic diastolic CHF (congestive heart failure) (HCC) 07/19/2013  . HCAP (healthcare-associated pneumonia) 07/18/2013  . Chronic diastolic heart failure (HCC) 06/24/2013  . Syncope 06/21/2013  . Syncope and collapse 06/21/2013  . Leukocytosis 06/21/2013  . Hypertensive heart disease with congestive heart failure (HCC) 06/21/2013  . Diabetes mellitus due to underlying condition (HCC) 06/21/2013    CBC    Component Value Date/Time   WBC 5.3 10/28/2014   WBC 4.7 07/22/2013 0420   RBC 3.56* 07/22/2013 0420   HGB 11.6* 10/28/2014   HCT 36 10/28/2014   PLT 173 10/28/2014   MCV 87.6 07/22/2013 0420   LYMPHSABS 1.1 07/18/2013 1910   MONOABS 0.9 07/18/2013 1910   EOSABS 0.1 07/18/2013 1910   BASOSABS 0.0 07/18/2013 1910    CMP     Component Value Date/Time   NA 140 08/22/2014   NA 137 07/22/2013 0420   K 4.1  08/22/2014   CL 97 07/22/2013 0420   CO2 29 07/22/2013 0420   GLUCOSE 98 07/22/2013 0420   BUN 13 08/22/2014   BUN 10 07/22/2013 0420   CREATININE 0.63 07/22/2013 0420   CALCIUM 8.7 07/22/2013 0420   PROT 7.4 07/18/2013 1910   ALBUMIN 3.1* 07/18/2013 1910   AST 13 07/18/2013 1910   ALT 9 07/18/2013 1910   ALKPHOS 84 07/18/2013 1910   BILITOT 0.8 07/18/2013 1910   GFRNONAA 83* 07/22/2013 0420   GFRAA >90 07/22/2013 0420    Assessment and Plan  Type 2 diabetes, controlled, with neuropathy 06/2015 A1c was 5.3, excellent; cont glucophage Er 500 mg daily; pt is on ARB  Peripheral sensory neuropathy due to type 2 diabetes mellitus Stable, no c/o  pain walking with restorative without problems; plan - cont neurontin 300 mg po qHS  GERD (gastroesophageal reflux disease) No signs of sx of reflux or aspiration;plan - cont prilosec 20 mg daily    [name], MD

## 2015-08-15 ENCOUNTER — Encounter: Payer: Self-pay | Admitting: Internal Medicine

## 2015-08-15 NOTE — Assessment & Plan Note (Signed)
Stable, no c/o pain walking with restorative without problems; plan - cont neurontin 300 mg po qHS

## 2015-08-15 NOTE — Assessment & Plan Note (Signed)
06/2015 A1c was 5.3, excellent; cont glucophage Er 500 mg daily; pt is on ARB

## 2015-08-15 NOTE — Assessment & Plan Note (Signed)
No signs of sx of reflux or aspiration;plan - cont prilosec 20 mg daily

## 2015-08-21 DIAGNOSIS — F329 Major depressive disorder, single episode, unspecified: Secondary | ICD-10-CM | POA: Diagnosis not present

## 2015-09-10 DIAGNOSIS — M6281 Muscle weakness (generalized): Secondary | ICD-10-CM | POA: Diagnosis not present

## 2015-09-10 DIAGNOSIS — I5032 Chronic diastolic (congestive) heart failure: Secondary | ICD-10-CM | POA: Diagnosis not present

## 2015-09-10 DIAGNOSIS — R2681 Unsteadiness on feet: Secondary | ICD-10-CM | POA: Diagnosis not present

## 2015-09-11 DIAGNOSIS — R2681 Unsteadiness on feet: Secondary | ICD-10-CM | POA: Diagnosis not present

## 2015-09-11 DIAGNOSIS — M6281 Muscle weakness (generalized): Secondary | ICD-10-CM | POA: Diagnosis not present

## 2015-09-11 DIAGNOSIS — I5032 Chronic diastolic (congestive) heart failure: Secondary | ICD-10-CM | POA: Diagnosis not present

## 2015-09-14 DIAGNOSIS — I5032 Chronic diastolic (congestive) heart failure: Secondary | ICD-10-CM | POA: Diagnosis not present

## 2015-09-14 DIAGNOSIS — R2681 Unsteadiness on feet: Secondary | ICD-10-CM | POA: Diagnosis not present

## 2015-09-14 DIAGNOSIS — M6281 Muscle weakness (generalized): Secondary | ICD-10-CM | POA: Diagnosis not present

## 2015-09-15 DIAGNOSIS — R946 Abnormal results of thyroid function studies: Secondary | ICD-10-CM | POA: Diagnosis not present

## 2015-09-15 DIAGNOSIS — I5032 Chronic diastolic (congestive) heart failure: Secondary | ICD-10-CM | POA: Diagnosis not present

## 2015-09-15 DIAGNOSIS — R2681 Unsteadiness on feet: Secondary | ICD-10-CM | POA: Diagnosis not present

## 2015-09-15 DIAGNOSIS — M6281 Muscle weakness (generalized): Secondary | ICD-10-CM | POA: Diagnosis not present

## 2015-09-16 DIAGNOSIS — I5032 Chronic diastolic (congestive) heart failure: Secondary | ICD-10-CM | POA: Diagnosis not present

## 2015-09-16 DIAGNOSIS — R2681 Unsteadiness on feet: Secondary | ICD-10-CM | POA: Diagnosis not present

## 2015-09-16 DIAGNOSIS — M6281 Muscle weakness (generalized): Secondary | ICD-10-CM | POA: Diagnosis not present

## 2015-09-17 DIAGNOSIS — I5032 Chronic diastolic (congestive) heart failure: Secondary | ICD-10-CM | POA: Diagnosis not present

## 2015-09-17 DIAGNOSIS — M6281 Muscle weakness (generalized): Secondary | ICD-10-CM | POA: Diagnosis not present

## 2015-09-17 DIAGNOSIS — R2681 Unsteadiness on feet: Secondary | ICD-10-CM | POA: Diagnosis not present

## 2015-09-18 DIAGNOSIS — M6281 Muscle weakness (generalized): Secondary | ICD-10-CM | POA: Diagnosis not present

## 2015-09-18 DIAGNOSIS — I5032 Chronic diastolic (congestive) heart failure: Secondary | ICD-10-CM | POA: Diagnosis not present

## 2015-09-18 DIAGNOSIS — R2681 Unsteadiness on feet: Secondary | ICD-10-CM | POA: Diagnosis not present

## 2015-09-19 DIAGNOSIS — R2681 Unsteadiness on feet: Secondary | ICD-10-CM | POA: Diagnosis not present

## 2015-09-19 DIAGNOSIS — M6281 Muscle weakness (generalized): Secondary | ICD-10-CM | POA: Diagnosis not present

## 2015-09-19 DIAGNOSIS — I5032 Chronic diastolic (congestive) heart failure: Secondary | ICD-10-CM | POA: Diagnosis not present

## 2015-09-21 DIAGNOSIS — I5032 Chronic diastolic (congestive) heart failure: Secondary | ICD-10-CM | POA: Diagnosis not present

## 2015-09-21 DIAGNOSIS — R2681 Unsteadiness on feet: Secondary | ICD-10-CM | POA: Diagnosis not present

## 2015-09-21 DIAGNOSIS — M6281 Muscle weakness (generalized): Secondary | ICD-10-CM | POA: Diagnosis not present

## 2015-09-22 DIAGNOSIS — M6281 Muscle weakness (generalized): Secondary | ICD-10-CM | POA: Diagnosis not present

## 2015-09-22 DIAGNOSIS — I5032 Chronic diastolic (congestive) heart failure: Secondary | ICD-10-CM | POA: Diagnosis not present

## 2015-09-22 DIAGNOSIS — R2681 Unsteadiness on feet: Secondary | ICD-10-CM | POA: Diagnosis not present

## 2015-09-23 DIAGNOSIS — M6281 Muscle weakness (generalized): Secondary | ICD-10-CM | POA: Diagnosis not present

## 2015-09-23 DIAGNOSIS — R2681 Unsteadiness on feet: Secondary | ICD-10-CM | POA: Diagnosis not present

## 2015-09-23 DIAGNOSIS — I5032 Chronic diastolic (congestive) heart failure: Secondary | ICD-10-CM | POA: Diagnosis not present

## 2015-09-24 DIAGNOSIS — R2681 Unsteadiness on feet: Secondary | ICD-10-CM | POA: Diagnosis not present

## 2015-09-24 DIAGNOSIS — M6281 Muscle weakness (generalized): Secondary | ICD-10-CM | POA: Diagnosis not present

## 2015-09-24 DIAGNOSIS — I5032 Chronic diastolic (congestive) heart failure: Secondary | ICD-10-CM | POA: Diagnosis not present

## 2015-09-25 DIAGNOSIS — R2681 Unsteadiness on feet: Secondary | ICD-10-CM | POA: Diagnosis not present

## 2015-09-25 DIAGNOSIS — M6281 Muscle weakness (generalized): Secondary | ICD-10-CM | POA: Diagnosis not present

## 2015-09-25 DIAGNOSIS — I5032 Chronic diastolic (congestive) heart failure: Secondary | ICD-10-CM | POA: Diagnosis not present

## 2015-09-27 DIAGNOSIS — R2681 Unsteadiness on feet: Secondary | ICD-10-CM | POA: Diagnosis not present

## 2015-09-27 DIAGNOSIS — M6281 Muscle weakness (generalized): Secondary | ICD-10-CM | POA: Diagnosis not present

## 2015-09-27 DIAGNOSIS — I5032 Chronic diastolic (congestive) heart failure: Secondary | ICD-10-CM | POA: Diagnosis not present

## 2015-09-28 DIAGNOSIS — M6281 Muscle weakness (generalized): Secondary | ICD-10-CM | POA: Diagnosis not present

## 2015-09-28 DIAGNOSIS — I5032 Chronic diastolic (congestive) heart failure: Secondary | ICD-10-CM | POA: Diagnosis not present

## 2015-09-28 DIAGNOSIS — R2681 Unsteadiness on feet: Secondary | ICD-10-CM | POA: Diagnosis not present

## 2015-10-06 ENCOUNTER — Non-Acute Institutional Stay (SKILLED_NURSING_FACILITY): Payer: Medicare Other | Admitting: Internal Medicine

## 2015-10-06 ENCOUNTER — Encounter: Payer: Self-pay | Admitting: Internal Medicine

## 2015-10-06 DIAGNOSIS — D509 Iron deficiency anemia, unspecified: Secondary | ICD-10-CM | POA: Diagnosis not present

## 2015-10-06 DIAGNOSIS — I5032 Chronic diastolic (congestive) heart failure: Secondary | ICD-10-CM

## 2015-10-06 DIAGNOSIS — F32A Depression, unspecified: Secondary | ICD-10-CM

## 2015-10-06 DIAGNOSIS — F329 Major depressive disorder, single episode, unspecified: Secondary | ICD-10-CM

## 2015-10-06 NOTE — Progress Notes (Signed)
MRN: [id number] Name: Margaret [name]  Sex: female Age: 82 y.o. DOB: 1932/02/20  [facility] Facility[facility]: Level Of Care: SNF Provider: Merrilee SeashoreALEXANDER, Sharyn Brilliant D Emergency Contacts: Extended Emergency Contact Information Primary Emergency Contact: Repass,[name] Address: [address]          [place], [place] [address] [place] Home Phone: [phone] Mobile Phone: [phone] Relation: Sister Secondary Emergency Contact: [name],[name] Address: [address]          [place], [place] [address] [place] Home Phone: [phone] Relation: Grandaughter  Code Status:   Allergies: Tequin  Chief Complaint  Patient presents with  . Medical Management of Chronic Issues    HPI: Patient is 82 y.o. female who is being seen for routine issues of anemia, DCHF and depression.   Past Medical History  Diagnosis Date  . Diabetes mellitus without complication (HCC)   . Hypertension   . Diabetes mellitus   . Gout   . Chronic diastolic heart failure [facility](HCC)     Past Surgical History  Procedure Laterality Date  . No past surgeries        Medication List       This list is accurate as of: 10/06/15 11:59 PM.  Always use your most recent med list.               acetaminophen 325 MG tablet  Commonly known as:  TYLENOL  Take 650 mg by mouth every 6 (six) hours as needed.     aspirin 81 MG chewable tablet  Chew 1 tablet (81 mg total) by mouth daily.     ferrous sulfate 325 (65 FE) MG tablet  Take 325 mg by mouth daily with breakfast.     FLUoxetine 10 MG tablet  Commonly known as:  PROZAC  Take 10 mg by mouth daily. For depression     furosemide 40 MG tablet  Commonly known as:  LASIX  Take 40 mg by mouth every morning.     gabapentin 300 MG capsule  Commonly known as:  NEURONTIN  Take 1 capsule (300 mg total) by mouth at bedtime.     losartan 25 MG tablet  Commonly known as:  COZAAR  Take 25 mg by mouth every morning. For HTN      metFORMIN 500 MG 24 hr tablet  Commonly known as:  GLUCOPHAGE-XR  Take 500 mg by mouth every evening. For diabetes     omeprazole 20 MG capsule  Commonly known as:  PRILOSEC  Take 20 mg by mouth daily. For GERD     potassium chloride 20 MEQ packet  Commonly known as:  KLOR-CON  Take 30 mEq by mouth daily. For K+ supplement     Propylene Glycol 0.6 % Soln  Apply 1 drop to eye 3 (three) times daily. For dry eyes     senna-docusate 8.6-50 MG tablet  Commonly known as:  Senokot-S  Take 2 tablets by mouth 2 (two) times daily.     traMADol-acetaminophen 37.5-325 MG tablet  Commonly known as:  ULTRACET  Take one tablet by mouth every 4 hours as needed for pain        No orders of the defined types were placed in this encounter.    Immunization History  Administered Date(s) Administered  . Influenza-Unspecified 03/23/2015  . PPD Test 08/12/2013  . Pneumococcal Polysaccharide-23 06/23/2013    Social History  Substance Use Topics  . Smoking status: Former Games developermoker  . Smokeless tobacco:  Not on file  . Alcohol Use: No    Review of Systems  DATA OBTAINED: from nurse GENERAL:  no fevers, fatigue, appetite changes SKIN: No itching, rash HEENT: No complaint RESPIRATORY: No cough, wheezing, SOB CARDIAC: No chest pain, palpitations, lower extremity edema  GI: No abdominal pain, No N/V/D or constipation, No heartburn or reflux  GU: No dysuria, frequency or urgency, or incontinence  MUSCULOSKELETAL: No unrelieved bone/joint pain NEUROLOGIC: No headache, dizziness  PSYCHIATRIC: No overt anxiety or sadness  Filed Vitals:   10/06/15 1659  BP: 144/84  Pulse: 91  Temp: 98.2 F (36.8 C)  Resp: 20    Physical Exam  GENERAL APPEARANCE: Alert,  No acute distress, in  [place] [facility]  SKIN: No diaphoresis rash HEENT: Unremarkable RESPIRATORY: Breathing is even, unlabored. Lung sounds are clear   CARDIOVASCULAR: Heart RRR no murmurs, rubs or gallops. No peripheral edema   GASTROINTESTINAL: Abdomen is soft, non-tender, not distended w/ normal bowel sounds.  GENITOURINARY: Bladder non tender, not distended  MUSCULOSKELETAL: No abnormal joints or musculature NEUROLOGIC: Cranial nerves 2-12 grossly intact. Moves all extremities PSYCHIATRIC: Mood and affect appropriate to situation with dementia, no behavioral issues  Patient Active Problem List   Diagnosis Date Noted  . Conjunctivitis 07/09/2015  . UTI (urinary tract infection) 03/24/2015  . GERD (gastroesophageal reflux disease) 12/17/2014  . Hypokalemia 12/17/2014  . Depression 11/25/2014  . Iron deficiency anemia 09/03/2014  . Chronic renal disease, stage 2, mildly decreased glomerular filtration rate (GFR) between 60-89 mL/min/1.73 square meter 09/03/2014  . Pain in joint, lower leg 08/21/2014  . Cough 01/27/2014  . Dizziness 01/12/2014  . Contusion of unspecified site 01/12/2014  . Lump in female breast 12/13/2013  . Lumbago 10/23/2013  . Type 2 diabetes, controlled, with neuropathy (HCC) 10/18/2013  . 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 Unspecified constipation 10/18/2013  . Plantar fasciitis, left 07/29/2013  . Physical deconditioning 07/21/2013  . Diabetes mellitus, controlled (HCC) 07/19/2013  . Chronic diastolic CHF (congestive heart failure) (HCC) 07/19/2013  . HCAP (healthcare-associated pneumonia) 07/18/2013  . Chronic diastolic heart failure (HCC) 06/24/2013  . Syncope 06/21/2013  . Syncope and collapse 06/21/2013  . Leukocytosis 06/21/2013  . Hypertensive heart disease with congestive heart failure (HCC) 06/21/2013  . Diabetes mellitus due to underlying condition (HCC) 06/21/2013    CBC    Component Value Date/Time   WBC 4.2 07/10/2015   WBC 4.7 07/22/2013 0420   RBC 3.56* 07/22/2013 0420   HGB 12.3 07/10/2015   HCT 37 07/10/2015   PLT 175 07/10/2015   MCV 87.6 07/22/2013 0420   LYMPHSABS 1.1 07/18/2013 1910   MONOABS 0.9  07/18/2013 1910   EOSABS 0.1 07/18/2013 1910   BASOSABS 0.0 07/18/2013 1910    CMP     Component Value Date/Time   NA 138 07/10/2015   NA 137 07/22/2013 0420   K 4.0 07/10/2015   CL 97 07/22/2013 0420   CO2 29 07/22/2013 0420   GLUCOSE 98 07/22/2013 0420   BUN 16 07/10/2015   BUN 10 07/22/2013 0420   CREATININE 0.9 07/10/2015   CREATININE 0.63 07/22/2013 0420   CALCIUM 8.7 07/22/2013 0420   PROT 7.4 07/18/2013 1910   ALBUMIN 3.1* 07/18/2013 1910   AST 13 07/18/2013 1910   ALT 9 07/18/2013 1910   ALKPHOS 84 07/18/2013 1910   BILITOT 0.8 07/18/2013 1910   GFRNONAA 83* 07/22/2013 0420   GFRAA >90 07/22/2013 0420    Assessment  and Plan  Chronic diastolic CHF (congestive heart failure) Pt has been stable without exacerbations;plan - cont cozaar and lasix 40 mg daily  Iron deficiency anemia Hb 11.8 in 12/2014 and 12.3 in 06/2015, improved with iron ;plan - cont iron daily  Depression Pt has been stable and seems very content; isout of her room daily in WC;plan - conr prozac 10 mg daily    [name], MD

## 2015-10-08 DIAGNOSIS — E785 Hyperlipidemia, unspecified: Secondary | ICD-10-CM | POA: Diagnosis not present

## 2015-10-08 DIAGNOSIS — I10 Essential (primary) hypertension: Secondary | ICD-10-CM | POA: Diagnosis not present

## 2015-10-08 DIAGNOSIS — E039 Hypothyroidism, unspecified: Secondary | ICD-10-CM | POA: Diagnosis not present

## 2015-10-08 DIAGNOSIS — E119 Type 2 diabetes mellitus without complications: Secondary | ICD-10-CM | POA: Diagnosis not present

## 2015-10-08 LAB — BASIC METABOLIC PANEL
BUN: 11 mg/dL (ref 4–21)
Creatinine: 0.8 mg/dL (ref <=1.1)
POTASSIUM: 3.5 mmol/L (ref 3.4–5.3)
SODIUM: 140 mmol/L (ref 137–147)

## 2015-10-08 LAB — HEMOGLOBIN A1C: Hemoglobin A1C: 5.7

## 2015-10-08 LAB — TSH: TSH: 0.42 u[IU]/mL (ref <=5.90)

## 2015-10-08 LAB — CBC AND DIFFERENTIAL
HCT: 37 % (ref 36–46)
Hemoglobin: 12.3 g/dL (ref 12.0–16.0)
PLATELETS: 217 10*3/uL (ref 150–399)
WBC: 5.4 10*3/mL

## 2015-10-08 LAB — HEPATIC FUNCTION PANEL
ALT: 12 U/L (ref 7–35)
AST: 18 U/L (ref 13–35)
Alkaline Phosphatase: 83 U/L (ref 25–125)
Bilirubin, Total: 0.6 mg/dL

## 2015-10-08 LAB — LIPID PANEL
CHOLESTEROL: 180 mg/dL (ref 0–200)
HDL: 56 mg/dL (ref 35–70)
LDL CALC: 99 mg/dL
Triglycerides: 123 mg/dL (ref 40–160)

## 2015-10-10 ENCOUNTER — Encounter: Payer: Self-pay | Admitting: Internal Medicine

## 2015-10-10 NOTE — Assessment & Plan Note (Signed)
Pt has been stable and seems very content; isout of her room daily in WC;plan - conr prozac 10 mg daily

## 2015-10-10 NOTE — Assessment & Plan Note (Signed)
Hb 11.8 in 12/2014 and 12.3 in 06/2015, improved with iron ;plan - cont iron daily

## 2015-10-10 NOTE — Assessment & Plan Note (Signed)
Pt has been stable without exacerbations;plan - cont cozaar and lasix 40 mg daily

## 2015-10-12 DIAGNOSIS — M25562 Pain in left knee: Secondary | ICD-10-CM | POA: Diagnosis not present

## 2015-10-12 DIAGNOSIS — M25561 Pain in right knee: Secondary | ICD-10-CM | POA: Diagnosis not present

## 2015-10-14 DIAGNOSIS — N390 Urinary tract infection, site not specified: Secondary | ICD-10-CM | POA: Diagnosis not present

## 2015-10-22 ENCOUNTER — Non-Acute Institutional Stay (SKILLED_NURSING_FACILITY): Payer: Medicare Other | Admitting: Internal Medicine

## 2015-10-22 DIAGNOSIS — N10 Acute tubulo-interstitial nephritis: Secondary | ICD-10-CM | POA: Diagnosis not present

## 2015-10-22 DIAGNOSIS — R609 Edema, unspecified: Secondary | ICD-10-CM | POA: Diagnosis not present

## 2015-10-23 DIAGNOSIS — R600 Localized edema: Secondary | ICD-10-CM | POA: Diagnosis not present

## 2015-10-25 DIAGNOSIS — R609 Edema, unspecified: Secondary | ICD-10-CM | POA: Insufficient documentation

## 2015-10-25 NOTE — Progress Notes (Signed)
Patient ID: Hennie Kandice Mooshi Schippers, female   DOB: 25-Feb-1932, 82 y.o.   MRN: [id number] MRN: [id number] Name: Regine Kandice Mooshi Petrov  Sex: female Age: 82 y.o. DOB: 25-Feb-1932  [facility] Facility[facility]: Level Of Care: SNF Provider: Roena MaladyLASSEN, Caya Soberanis C Emergency Contacts: Extended Emergency Contact Information Primary Emergency Contact: Killgore,[name] Address: [address]          [place], [place] [address] [place] Home Phone: [phone] Mobile Phone: [phone] Relation: Sister Secondary Emergency Contact: [name],[name] Address: [address]          [place], [place] [address] [place] Home Phone: [phone] Relation: Grandaughter  Code Status:   Allergies: Tequin  Chief Complaint  Patient presents with  . Acute Visit   Secondary to right lower leg edema-UTI HPI: Patient is 82 y.o. female who is seen today for UTI-culture is growing out Escherichia coli greater than 100,000 colonies-she has been put on Bactrim empirically interestingly lab report says was not enough colonies for antibiotic sensitivities although there were greater than 100,000 colonies which is somewhat puzzling-nonetheless she appears to Kaydin doing well with the Bactrim She is afebrile does not complain of dysuria.  Apparently she is has some mild increased edema of her right lower leg and foot-she has somewhat intermittent complaints of leg and foot pains so difficult to tell there is an acute change here. Her vital signs are stable she is afebrile.  Continues to ambulate at her baseline it appears   Past Medical History  Diagnosis Date  . Diabetes mellitus without complication (HCC)   . Hypertension   . Diabetes mellitus   . Gout   . Chronic diastolic heart failure [facility](HCC)     Past Surgical History  Procedure Laterality Date  . No past surgeries        Medication List       This list is accurate as of: 10/22/15 11:59 PM.  Always use your most recent med list.                acetaminophen 325 MG tablet  Commonly known as:  TYLENOL  Take 650 mg by mouth every 6 (six) hours as needed.     aspirin 81 MG chewable tablet  Chew 1 tablet (81 mg total) by mouth daily.     ferrous sulfate 325 (65 FE) MG tablet  Take 325 mg by mouth daily with breakfast.     FLUoxetine 10 MG tablet  Commonly known as:  PROZAC  Take 10 mg by mouth daily. For depression     furosemide 40 MG tablet  Commonly known as:  LASIX  Take 40 mg by mouth every morning.     gabapentin 300 MG capsule  Commonly known as:  NEURONTIN  Take 1 capsule (300 mg total) by mouth at bedtime.     losartan 25 MG tablet  Commonly known as:  COZAAR  Take 25 mg by mouth every morning. For HTN     metFORMIN 500 MG 24 hr tablet  Commonly known as:  GLUCOPHAGE-XR  Take 500 mg by mouth every evening. For diabetes     omeprazole 20 MG capsule  Commonly known as:  PRILOSEC  Take 20 mg by mouth daily. For GERD     potassium chloride 10 MEQ tablet  Commonly known as:  K-DUR  Take 30 mEq by mouth daily. For potassium     Propylene Glycol 0.6 % Soln  Apply 1 drop to eye 3 (  three) times daily. For dry eyes     senna-docusate 8.6-50 MG tablet  Commonly known as:  Senokot-S  Take 2 tablets by mouth 2 (two) times daily.     traMADol-acetaminophen 37.5-325 MG tablet  Commonly known as:  ULTRACET  Take one tablet by mouth every 4 hours as needed for pain        Meds ordered this encounter  Medications  . potassium chloride (K-DUR) 10 MEQ tablet    Sig: Take 30 mEq by mouth daily. For potassium    Immunization History  Administered Date(s) Administered  . Influenza-Unspecified 03/23/2015  . PPD Test 08/12/2013  . Pneumococcal Polysaccharide-23 06/23/2013    Social History  Substance Use Topics  . Smoking status: Former Games developer  . Smokeless tobacco: Not on file  . Alcohol Use: No    Review of Systems  DATA OBTAINED: from patient, nurse GENERAL:  no fevers, fatigue, appetite  changes SKIN: No itching, rash HEENT: No complaint RESPIRATORY: No cough, wheezing, SOB CARDIAC: No chest pain, palpitation---smild right, lower extremity edema  GI: No abdominal pain, No N/V/D or constipation, No heartburn or reflux  GU: positive dysuria,  MUSCULOSKELETAL: No unrelieved bone/joint pain-intermittent complaints of lower leg pain apparently more so on the right today  NEUROLOGIC: No headache, dizziness  PSYCHIATRIC: No overt anxiety or sadness  Filed Vitals:   10/22/15 1358  BP: 120/73  Pulse: 85  Temp: 99.3 F (37.4 C)  Resp: 20    Physical Exam  GENERAL APPEARANCE: Alert,  No acute distress  SKIN: No diaphoresis rash HEENT: Unremarkable RESPIRATORY: Breathing is even, unlabored. Lung sounds are clear   CARDIOVASCULAR: Heart RRR no murmurs, rubs or gallops. Mild right low ext   peripheral edema --slightly reduced pedal pulse GASTROINTESTINAL: Abdomen is soft, non-tender, not distended w/ normal bowel sounds.  GENITOURINARY: Could not really appreciate significant suprapubic tenderness or distention MUSCULOSKELETAL: No abnormal joints or musculature--could not really appreciate any deformity right lower extremity or acute tenderness to palpation no erythema- NEUROLOGIC: Cranial nerves 2-12 grossly intact. Moves all extremities PSYCHIATRIC: Mood and affect appropriate to situation, no behavioral issues  Patient Active Problem List   Diagnosis Date Noted  . Edema 10/25/2015  . Conjunctivitis 07/09/2015  . UTI (urinary tract infection) 03/24/2015  . GERD (gastroesophageal reflux disease) 12/17/2014  . Hypokalemia 12/17/2014  . Depression 11/25/2014  . Iron deficiency anemia 09/03/2014  . Chronic renal disease, stage 2, mildly decreased glomerular filtration rate (GFR) between 60-89 mL/min/1.73 square meter 09/03/2014  . Pain in joint, lower leg 08/21/2014  . Cough 01/27/2014  . Dizziness 01/12/2014  . Contusion of unspecified site 01/12/2014  . Lump in  female breast 12/13/2013  . Lumbago 10/23/2013  . Type 2 diabetes, controlled, with neuropathy (HCC) 10/18/2013  . 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 Unspecified constipation 10/18/2013  . Plantar fasciitis, left 07/29/2013  . Physical deconditioning 07/21/2013  . Diabetes mellitus, controlled (HCC) 07/19/2013  . Chronic diastolic CHF (congestive heart failure) (HCC) 07/19/2013  . HCAP (healthcare-associated pneumonia) 07/18/2013  . Chronic diastolic heart failure (HCC) 06/24/2013  . Syncope 06/21/2013  . Syncope and collapse 06/21/2013  . Leukocytosis 06/21/2013  . Hypertensive heart disease with congestive heart failure (HCC) 06/21/2013  . Diabetes mellitus due to underlying condition (HCC) 06/21/2013    CBC    Component Value Date/Time   WBC 5.4 10/08/2015   WBC 4.7 07/22/2013 0420   RBC 3.56* 07/22/2013 0420   HGB 12.3 10/08/2015  HCT 37 10/08/2015   PLT 217 10/08/2015   MCV 87.6 07/22/2013 0420   LYMPHSABS 1.1 07/18/2013 1910   MONOABS 0.9 07/18/2013 1910   EOSABS 0.1 07/18/2013 1910   BASOSABS 0.0 07/18/2013 1910    CMP     Component Value Date/Time   NA 140 10/08/2015   NA 137 07/22/2013 0420   K 3.5 10/08/2015   CL 97 07/22/2013 0420   CO2 29 07/22/2013 0420   GLUCOSE 98 07/22/2013 0420   BUN 11 10/08/2015   BUN 10 07/22/2013 0420   CREATININE 0.8 10/08/2015   CREATININE 0.63 07/22/2013 0420   CALCIUM 8.7 07/22/2013 0420   PROT 7.4 07/18/2013 1910   ALBUMIN 3.1* 07/18/2013 1910   AST 18 10/08/2015   ALT 12 10/08/2015   ALKPHOS 83 10/08/2015   BILITOT 0.8 07/18/2013 1910   GFRNONAA 83* 07/22/2013 0420   GFRAA >90 07/22/2013 0420    Assessment and Plan  #1-UTI-will continue Bactrim twice a day for UTI 7 days-I have spoken with nursing staff about trying to get some clarity from the lab about inability to get antibiotic sensitivities-nonetheless she appears to Lu doing well with the Bactrim  #2 history  of mild right leg edema this is fairly minimal-will obtain a venous Doppler however to rule out any DVT-also will x-ray leg foot and ankle.  She does receive Neurontin and tramadol for pain this appears to Jennifermarie effective  [id number]    Rebekka Lobello C,

## 2015-10-31 DIAGNOSIS — R1032 Left lower quadrant pain: Secondary | ICD-10-CM | POA: Diagnosis not present

## 2015-10-31 DIAGNOSIS — R1012 Left upper quadrant pain: Secondary | ICD-10-CM | POA: Diagnosis not present

## 2015-11-02 ENCOUNTER — Non-Acute Institutional Stay (SKILLED_NURSING_FACILITY): Payer: Medicare Other | Admitting: Internal Medicine

## 2015-11-02 ENCOUNTER — Encounter: Payer: Self-pay | Admitting: Internal Medicine

## 2015-11-02 DIAGNOSIS — R062 Wheezing: Secondary | ICD-10-CM | POA: Diagnosis not present

## 2015-11-02 DIAGNOSIS — R52 Pain, unspecified: Secondary | ICD-10-CM | POA: Diagnosis not present

## 2015-11-02 NOTE — Progress Notes (Signed)
[facility] Room Number: 416 Place of Service:  SNF (31) Provider:  Phylliss BlakesArlo Lasse PA-C  Pcp Not In System  Patient Care Team: Pcp Not In System as PCP - General  Extended Emergency Contact Information Primary Emergency Contact: Nobel,[name] Address: [address]          [place], [place] [address] [place] Home Phone: [phone] Mobile Phone: [phone] Relation: Sister Secondary Emergency Contact: [name],[name] Address: [address]          [place], [place] [address] [place] Home Phone: [phone] Relation: Grandaughter   Goals of care: Advanced Directive information Advanced Directives 11/02/2015  Does patient have an advance directive? No  Would patient like information on creating an advanced directive? No - patient declined information     Chief Complaint  Patient presents with  . Acute Visit    Pain on left side and under breast    HPI:  Pt is a 82 y.o. female seen today for an acute visit for Complaints of left thorax pain.  There's been no history of trauma to my knowledge.  Apparently she had wheezing at one point a chest x-ray was ordered which has returned showing no acute process he did not show any rib fractures or abnormality in that respect.  She continues to Arieanna at her baseline ambulating about the hallway does not appear to Reniyah in any acute distress her vital signs appear to Lynora stable.  She does have Ultracet as needed for pain.  She also is on Neurontin for apparent neuropathy she receives this at night.  In regards to wheezing she does have when necessary duo nebs and apparently this helped significantly and it was administered.  Patient does not speak English which makes at times communication difficult-but she appears to Maram in no distress today at times will point toward left thorax area when asked if she has any discomfort   Past Medical History    Diagnosis Date  . Diabetes mellitus without complication (HCC)   . Hypertension   . Diabetes mellitus   . Gout   . Chronic diastolic heart failure [facility](HCC)    Past Surgical History  Procedure Laterality Date  . No past surgeries      Allergies  Allergen Reactions  . Tequin [Gatifloxacin]       Medication List       This list is accurate as of: 11/02/15 10:33 AM.  Always use your most recent med list.               aspirin 81 MG chewable tablet  Chew 1 tablet (81 mg total) by mouth daily.     ferrous sulfate 325 (65 FE) MG tablet  Take 325 mg by mouth daily with breakfast.     FLUoxetine 10 MG tablet  Commonly known as:  PROZAC  Take 10 mg by mouth daily. For depression     furosemide 40 MG tablet  Commonly known as:  LASIX  Take 40 mg by mouth every morning.     gabapentin 300 MG capsule  Commonly known as:  NEURONTIN  Take 1 capsule (300 mg total) by mouth at bedtime.     losartan 25 MG tablet  Commonly known as:  COZAAR  Take 25 mg by mouth every morning. For HTN     metFORMIN 500 MG 24 hr tablet  Commonly known as:  GLUCOPHAGE-XR  Take 500 mg by mouth  every evening. For diabetes     omeprazole 20 MG capsule  Commonly known as:  PRILOSEC  Take 20 mg by mouth daily. For GERD     potassium chloride 10 MEQ tablet  Commonly known as:  K-DUR  Take 30 mEq by mouth daily. For potassium     Propylene Glycol 0.6 % Soln  Apply 1 drop to eye 3 (three) times daily. For dry eyes     traMADol-acetaminophen 37.5-325 MG tablet  Commonly known as:  ULTRACET  Take one tablet by mouth every 4 hours as needed for pain        Review of Systems again this is somewhat limited obtain from patient and nursing.  In general no fever chills noted.  Skin no rashes noted.  Head ears eyes nose mouth and throat no complaints of dysphagia sore throat nasal discharge.  Breast weight had an episode of wheezing apparently resolved with nebulizers which she has when necessary  chest x-ray again was nondiagnostic.  Cardiac does not complain of chest pain.  GI no complaints of abdominal discomfort no nausea or vomiting noted diarrhea or constipation.  GU no complaints of dysuria.  Muscle skeletal is complaining of some left mid thorax discomfort as noted above.  Neurologic no complaints of syncope or dizziness noted.    Immunization History  Administered Date(s) Administered  . Influenza-Unspecified 03/23/2015  . PPD Test 08/12/2013  . Pneumococcal Polysaccharide-23 06/23/2013   Pertinent  Health Maintenance Due  Topic Date Due  . DEXA SCAN  06/27/1999  . FOOT EXAM  11/03/2015 (Originally 03/19/2015)  . PNA vac Low Risk Adult (2 of 2 - PCV13) 09/23/2016 (Originally 06/23/2014)  . INFLUENZA VACCINE  01/26/2016  . OPHTHALMOLOGY EXAM  01/27/2016  . HEMOGLOBIN A1C  04/08/2016   No flowsheet data found. Functional Status Survey:    Filed Vitals:   11/02/15 1030  BP: 128/75  Pulse: 82  Temp: 97.5 F (36.4 C)  TempSrc: Oral  Resp: 18  Weight: 108 lb 3.2 oz (49.079 kg)   Body mass index is 23.41 kg/(m^2). Physical Exam   In general this is somewhat frail elderly female in no distress.  Her skin is warm and dry do not note any increased bruising.  Oropharynx clear mucous membranes moist.  Chest is clear to auscultation with somewhat shallow air entry there is no labored breathing.  Heart is regular rate and rhythm without murmur gallop or rub she has quite minimal lower extremity edema bilaterally.  Abdomen is soft nontender positive bowel sounds.  Muscle skeletal is able to move all extremities 4 baseline there is some tenderness to palpation lower rib cage area on the left I do not see any deformity erythema or edema.  Neurologic is grossly intact speech is clear.  Psych again limited secondary to language issues but follows verbal commands with prompting appears to Kyle at baseline.    Labs reviewed:  Recent Labs  07/10/15  10/08/15  NA 138 140  K 4.0 3.5  BUN 16 11  CREATININE 0.9 0.8    Recent Labs  10/08/15  AST 18  ALT 12  ALKPHOS 83    Recent Labs  07/10/15 10/08/15  WBC 4.2 5.4  HGB 12.3 12.3  HCT 37 37  PLT 175 217   Lab Results  Component Value Date   TSH 0.42 10/08/2015   Lab Results  Component Value Date   HGBA1C 5.7 10/08/2015   Lab Results  Component Value Date   CHOL 180  10/08/2015   HDL 56 10/08/2015   LDLCALC 99 10/08/2015   TRIG 123 10/08/2015   CHOLHDL 3.2 06/22/2013    Significant Diagnostic Results in last 30 days:  No results found.  Assessment/Plan  History of left thorax discomfort-physical exam appear to Markeria benign she does have a history of of some right breast discomfort in the past with a firm area that patient has declined a workup for-however this pain appears to Emmilyn left lateral thorax under the breasts-at this point will monitor and encourage the Ultracet when necessary-this possibly may Antoria a muscle strain will watch however see if this persists.  #2 wheezing this has responded to the duo nebs chest x-ray was not concerning again will have to monitor this as well.  [id number]--VW note more than 25 minutes spent assessing patient discussing her status with nursing staff-reviewing her chart-and discuss her status with Dr. Lyn Hollingshead via phone as well-of note greater than 50% of time spent coordinating plan of care

## 2015-11-05 DIAGNOSIS — F05 Delirium due to known physiological condition: Secondary | ICD-10-CM | POA: Diagnosis not present

## 2015-11-05 DIAGNOSIS — F329 Major depressive disorder, single episode, unspecified: Secondary | ICD-10-CM | POA: Diagnosis not present

## 2015-11-06 ENCOUNTER — Non-Acute Institutional Stay (SKILLED_NURSING_FACILITY): Payer: Medicare Other | Admitting: Internal Medicine

## 2015-11-06 ENCOUNTER — Encounter: Payer: Self-pay | Admitting: Internal Medicine

## 2015-11-06 DIAGNOSIS — K219 Gastro-esophageal reflux disease without esophagitis: Secondary | ICD-10-CM | POA: Diagnosis not present

## 2015-11-06 DIAGNOSIS — E1140 Type 2 diabetes mellitus with diabetic neuropathy, unspecified: Secondary | ICD-10-CM | POA: Diagnosis not present

## 2015-11-06 DIAGNOSIS — I110 Hypertensive heart disease with heart failure: Secondary | ICD-10-CM

## 2015-11-06 NOTE — Progress Notes (Deleted)
[place] Room Number: Will of Service: SNF (31)     Allergies  Allergen Reactions  . Tequin [Gatifloxacin]     Chief Complaint  Patient presents with  . Medical Management of Chronic Issues    Routine Visit    HPI:  ***  Medications: Patient's Medications  New Prescriptions   No medications on file  Previous Medications   ASPIRIN 81 MG CHEWABLE TABLET    Chew 1 tablet (81 mg total) by mouth daily.   FERROUS SULFATE 325 (65 FE) MG TABLET    Take 325 mg by mouth daily with breakfast.   FLUOXETINE (PROZAC) 10 MG TABLET    Take 10 mg by mouth daily. For depression   FUROSEMIDE (LASIX) 40 MG TABLET    Take 40 mg by mouth every morning.    GABAPENTIN (NEURONTIN) 300 MG CAPSULE    Take 1 capsule (300 mg total) by mouth at bedtime.   LOSARTAN (COZAAR) 25 MG TABLET    Take 25 mg by mouth every morning. For HTN   METFORMIN (GLUCOPHAGE-XR) 500 MG 24 HR TABLET    Take 500 mg by mouth every evening. For diabetes   OMEPRAZOLE (PRILOSEC) 20 MG CAPSULE    Take 20 mg by mouth daily. For GERD   POTASSIUM CHLORIDE (K-DUR) 10 MEQ TABLET    Take 30 mEq by mouth daily. For potassium   PROPYLENE GLYCOL 0.6 % SOLN    Apply 1 drop to eye 3 (three) times daily. For dry eyes   TRAMADOL-ACETAMINOPHEN (ULTRACET) 37.5-325 MG TABLET    Take one tablet by mouth every 4 hours as needed for pain  Modified Medications   No medications on file  Discontinued Medications   No medications on file     Review of Systems  Constitutional: Negative.   HENT: Negative.   Eyes: Positive for redness.  Respiratory: Negative.   Cardiovascular: Negative.     Filed Vitals:   11/06/15 1404  BP: 128/75  Pulse: 77  Temp: 97.5 F (36.4 C)  TempSrc: Oral  Resp: 20  Height: '4\' 9"'  (1.448 m)  Weight: 108 lb 3.2 oz (49.079 kg)   Wt Readings from Last 3 Encounters:  11/06/15 108 lb 3.2 oz (49.079 kg)  11/02/15 108 lb 3.2 oz (49.079 kg)  10/22/13 104  lb (47.174 kg)    Body mass index is 23.41 kg/(m^2).  Physical Exam   Labs reviewed: Lab Summary Latest Ref Rng 10/08/2015 07/10/2015 10/28/2014 09/04/2014 08/22/2014  Hemoglobin 12.0 - 16.0 g/dL 12.3 12.3 11.6(A) (None) (None)  Hematocrit 36 - 46 % 37 37 36 (None) (None)  White count - 5.4 4.2 5.3 (None) (None)  Platelet count 150 - 399 K/L 217 175 173 (None) (None)  Sodium 137 - 147 mmol/L 140 138 (None) (None) 140  Potassium 3.4 - 5.3 mmol/L 3.5 4.0 (None) (None) 4.1  Calcium - (None) (None) (None) (None) (None)  Phosphorus - (None) (None) (None) (None) (None)  Creatinine .5 - 1.1 mg/dL 0.8 0.9 (None) (None) (None)  AST 13 - 35 U/L 18 (None) (None) (None) (None)  Alk Phos 25 - 125 U/L 83 (None) (None) (None) (None)  Bilirubin - (None) (None) (None) (None) (None)  Glucose - (None) 158 (None) (None) 122  Cholesterol 0 - 200 mg/dL 180 (None) (None) 146 (None)  HDL cholesterol 35 - 70 mg/dL 56 (None) (None) 34(A) (None)  Triglycerides 40 - 160 mg/dL 123 (None) (None) 90 (  None)  LDL Direct - (None) (None) (None) (None) (None)  LDL Calc - 99 (None) (None) 94 (None)  Total protein - (None) (None) (None) (None) (None)  Albumin - (None) (None) (None) (None) (None)   Lab Results  Component Value Date   TSH 0.42 10/08/2015   Lab Results  Component Value Date   BUN 11 10/08/2015   BUN 16 07/10/2015   BUN 13 08/22/2014   Lab Results  Component Value Date   CREATININE 0.8 10/08/2015   CREATININE 0.9 07/10/2015   CREATININE 0.63 07/22/2013   Lab Results  Component Value Date   HGBA1C 5.7 10/08/2015   HGBA1C 5.3 07/14/2015   HGBA1C 6.0 08/01/2014       Assessment/Plan

## 2015-11-06 NOTE — Progress Notes (Signed)
MRN: [id number] Name: Margaret [name]  Sex: female Age: 82 y.o. DOB: [birth year]  PSC #: [facility] Facility/Room: [address] Level Of Care: SNF Provider: [name], Mckenlee Mangham D Emergency Contacts: Extended Emergency Contact Information Primary Emergency Contact: Cadden,[name] Address: [address]          [place], [place] [address] [place] Home Phone: [phone] Mobile Phone: [phone] Relation: Sister Secondary Emergency Contact: [name],[name] Address: [address]          [place], [place] [address] [place] Home Phone: [phone] Relation: Grandaughter  Code Status: Full code  Allergies: Tequin  Chief Complaint  Patient presents with  . Medical Management of Chronic Issues    Routine Visit    HPI: Patient is 82 y.o. female who is being seen for routine issues of HTN, GERD and DM2.  Past Medical History  Diagnosis Date  . Diabetes mellitus without complication (HCC)   . Hypertension   . Diabetes mellitus   . Gout   . Chronic diastolic heart failure [facility](HCC)     Past Surgical History  Procedure Laterality Date  . No past surgeries        Medication List       This list is accurate as of: 11/06/15 11:59 PM.  Always use your most recent med list.               aspirin 81 MG chewable tablet  Chew 1 tablet (81 mg total) by mouth daily.     ferrous sulfate 325 (65 FE) MG tablet  Take 325 mg by mouth daily with breakfast.     FLUoxetine 10 MG tablet  Commonly known as:  PROZAC  Take 10 mg by mouth daily. For depression     furosemide 40 MG tablet  Commonly known as:  LASIX  Take 40 mg by mouth every morning.     gabapentin 300 MG capsule  Commonly known as:  NEURONTIN  Take 1 capsule (300 mg total) by mouth at bedtime.     losartan 25 MG tablet  Commonly known as:  COZAAR  Take 25 mg by mouth every morning. For HTN     metFORMIN 500 MG 24 hr tablet  Commonly known as:  GLUCOPHAGE-XR  Take 500 mg by mouth every  evening. For diabetes     omeprazole 20 MG capsule  Commonly known as:  PRILOSEC  Take 20 mg by mouth daily. For GERD     potassium chloride 10 MEQ tablet  Commonly known as:  K-DUR  Take 30 mEq by mouth daily. For potassium     Propylene Glycol 0.6 % Soln  Apply 1 drop to eye 3 (three) times daily. For dry eyes     traMADol-acetaminophen 37.5-325 MG tablet  Commonly known as:  ULTRACET  Take one tablet by mouth every 4 hours as needed for pain        No orders of the defined types were placed in this encounter.    Immunization History  Administered Date(s) Administered  . Influenza-Unspecified 03/23/2015  . PPD Test 08/12/2013  . Pneumococcal Polysaccharide-23 06/23/2013    Social History  Substance Use Topics  . Smoking status: Former Games developermoker  . Smokeless tobacco: Not on file  . Alcohol Use: No    Review of Systems  DATA OBTAINED: from patient, nurse GENERAL:  no fevers, fatigue, appetite changes SKIN: No itching, rash HEENT: No complaint RESPIRATORY: No cough, wheezing, SOB CARDIAC: No chest pain, palpitations,  lower extremity edema  GI: No abdominal pain, No N/V/D or constipation, No heartburn or reflux  GU: No dysuria, frequency or urgency, or incontinence  MUSCULOSKELETAL: No unrelieved bone/joint pain NEUROLOGIC: No headache, dizziness  PSYCHIATRIC: No overt anxiety or sadness  Filed Vitals:   11/06/15 1404  BP: 128/75  Pulse: 77  Temp: 97.5 F (36.4 C)  Resp: 20    Physical Exam  GENERAL APPEARANCE: Alert, conversant, No acute distress  SKIN: No diaphoresis rash, wounds; ne bruise L arm HEENT: Unremarkable RESPIRATORY: Breathing is even, unlabored. Lung sounds are clear   CARDIOVASCULAR: Heart RRR no murmurs, rubs or gallops. No peripheral edema  GASTROINTESTINAL: Abdomen is soft, non-tender, not distended w/ normal bowel sounds.  GENITOURINARY: Bladder non tender, not distended  MUSCULOSKELETAL: No abnormal joints or  musculature NEUROLOGIC: Cranial nerves 2-12 grossly intact. Moves all extremities PSYCHIATRIC: Mood and affect appropriate to situation, no behavioral issues  Patient Active Problem List   Diagnosis Date Noted  . Edema 10/25/2015  . Conjunctivitis 07/09/2015  . UTI (urinary tract infection) 03/24/2015  . GERD (gastroesophageal reflux disease) 12/17/2014  . Hypokalemia 12/17/2014  . Depression 11/25/2014  . Iron deficiency anemia 09/03/2014  . Chronic renal disease, stage 2, mildly decreased glomerular filtration rate (GFR) between 60-89 mL/min/1.73 square meter 09/03/2014  . Pain in joint, lower leg 08/21/2014  . Cough 01/27/2014  . Dizziness 01/12/2014  . Contusion of unspecified site 01/12/2014  . Lump in female breast 12/13/2013  . Lumbago 10/23/2013  . Type 2 diabetes, controlled, with neuropathy (HCC) 10/18/2013  . 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 Unspecified constipation 10/18/2013  . Plantar fasciitis, left 07/29/2013  . Physical deconditioning 07/21/2013  . Diabetes mellitus, controlled (HCC) 07/19/2013  . Chronic diastolic CHF (congestive heart failure) (HCC) 07/19/2013  . HCAP (healthcare-associated pneumonia) 07/18/2013  . Chronic diastolic heart failure (HCC) 06/24/2013  . Syncope 06/21/2013  . Syncope and collapse 06/21/2013  . Leukocytosis 06/21/2013  . Hypertensive heart disease with congestive heart failure (HCC) 06/21/2013  . Diabetes mellitus due to underlying condition (HCC) 06/21/2013    CBC    Component Value Date/Time   WBC 5.4 10/08/2015   WBC 4.7 07/22/2013 0420   RBC 3.56* 07/22/2013 0420   HGB 12.3 10/08/2015   HCT 37 10/08/2015   PLT 217 10/08/2015   MCV 87.6 07/22/2013 0420   LYMPHSABS 1.1 07/18/2013 1910   MONOABS 0.9 07/18/2013 1910   EOSABS 0.1 07/18/2013 1910   BASOSABS 0.0 07/18/2013 1910    CMP     Component Value Date/Time   NA 140 10/08/2015   NA 137 07/22/2013 0420   K 3.5  10/08/2015   CL 97 07/22/2013 0420   CO2 29 07/22/2013 0420   GLUCOSE 98 07/22/2013 0420   BUN 11 10/08/2015   BUN 10 07/22/2013 0420   CREATININE 0.8 10/08/2015   CREATININE 0.63 07/22/2013 0420   CALCIUM 8.7 07/22/2013 0420   PROT 7.4 07/18/2013 1910   ALBUMIN 3.1* 07/18/2013 1910   AST 18 10/08/2015   ALT 12 10/08/2015   ALKPHOS 83 10/08/2015   BILITOT 0.8 07/18/2013 1910   GFRNONAA 83* 07/22/2013 0420   GFRAA >90 07/22/2013 0420    Assessment and Plan  Hypertensive heart disease with congestive heart failure Controlled on cozaar 25 and lasix 40 mg daily'plan - cont current meds  GERD (gastroesophageal reflux disease) No known problems with reflux;plan - cont protonix 40 mg  Type 2 diabetes, controlled, with  neuropathy 09/2015  A1c 5.7, good control on Glucophage XR 500 mg;pt is on ARB    [name], MD

## 2015-11-07 ENCOUNTER — Encounter: Payer: Self-pay | Admitting: Internal Medicine

## 2015-11-07 DIAGNOSIS — R062 Wheezing: Secondary | ICD-10-CM | POA: Insufficient documentation

## 2015-11-07 DIAGNOSIS — R52 Pain, unspecified: Secondary | ICD-10-CM | POA: Insufficient documentation

## 2015-11-07 NOTE — Assessment & Plan Note (Signed)
Controlled on cozaar 25 and lasix 40 mg daily'plan - cont current meds

## 2015-11-07 NOTE — Assessment & Plan Note (Signed)
No known problems with reflux;plan - cont protonix 40 mg

## 2015-11-07 NOTE — Progress Notes (Signed)
This encounter was created in error - please disregard.

## 2015-11-07 NOTE — Assessment & Plan Note (Signed)
09/2015  A1c 5.7, good control on Glucophage XR 500 mg;pt is on ARB

## 2015-11-10 DIAGNOSIS — I10 Essential (primary) hypertension: Secondary | ICD-10-CM | POA: Diagnosis not present

## 2015-11-11 LAB — CBC AND DIFFERENTIAL
HEMATOCRIT: 34 % — AB (ref 36–46)
Hemoglobin: 11.1 g/dL — AB (ref 12.0–16.0)
Platelets: 177 10*3/uL (ref 150–399)
WBC: 5 10^3/mL

## 2015-11-11 LAB — BASIC METABOLIC PANEL
BUN: 13 mg/dL (ref 4–21)
CREATININE: 0.8 mg/dL (ref 0.5–1.1)
GLUCOSE: 149 mg/dL
Potassium: 3.7 mmol/L (ref 3.4–5.3)
SODIUM: 140 mmol/L (ref 137–147)

## 2015-11-11 LAB — HEPATIC FUNCTION PANEL
ALT: 10 U/L (ref 7–35)
AST: 18 U/L (ref 13–35)
Alkaline Phosphatase: 67 U/L (ref 25–125)
BILIRUBIN, TOTAL: 0.3 mg/dL

## 2015-12-10 ENCOUNTER — Other Ambulatory Visit: Payer: Self-pay | Admitting: *Deleted

## 2015-12-10 MED ORDER — TRAMADOL-ACETAMINOPHEN 37.5-325 MG PO TABS: ORAL_TABLET | ORAL | Status: DC

## 2015-12-10 NOTE — Telephone Encounter (Signed)
[facility] 

## 2015-12-16 DIAGNOSIS — E119 Type 2 diabetes mellitus without complications: Secondary | ICD-10-CM | POA: Diagnosis not present

## 2015-12-16 DIAGNOSIS — M79672 Pain in left foot: Secondary | ICD-10-CM | POA: Diagnosis not present

## 2015-12-16 DIAGNOSIS — M79671 Pain in right foot: Secondary | ICD-10-CM | POA: Diagnosis not present

## 2015-12-16 DIAGNOSIS — B351 Tinea unguium: Secondary | ICD-10-CM | POA: Diagnosis not present

## 2015-12-16 LAB — HM DIABETES FOOT EXAM

## 2016-01-12 DIAGNOSIS — M6281 Muscle weakness (generalized): Secondary | ICD-10-CM | POA: Diagnosis not present

## 2016-01-12 DIAGNOSIS — I5032 Chronic diastolic (congestive) heart failure: Secondary | ICD-10-CM | POA: Diagnosis not present

## 2016-01-19 ENCOUNTER — Non-Acute Institutional Stay (SKILLED_NURSING_FACILITY): Payer: Medicare Other | Admitting: Internal Medicine

## 2016-01-19 ENCOUNTER — Encounter: Payer: Self-pay | Admitting: Internal Medicine

## 2016-01-19 DIAGNOSIS — D509 Iron deficiency anemia, unspecified: Secondary | ICD-10-CM | POA: Diagnosis not present

## 2016-01-19 DIAGNOSIS — F32A Depression, unspecified: Secondary | ICD-10-CM

## 2016-01-19 DIAGNOSIS — I5032 Chronic diastolic (congestive) heart failure: Secondary | ICD-10-CM

## 2016-01-19 DIAGNOSIS — F329 Major depressive disorder, single episode, unspecified: Secondary | ICD-10-CM | POA: Diagnosis not present

## 2016-01-19 NOTE — Progress Notes (Signed)
MRN: [id number] Name: Margaret [name]  Sex: female Age: 82 y.o. DOB: 11/14/1931  PSC #:  Facility/Room: [facility] /[address] Level Of Care: SNF Provider: Randon Goldsmith. Lyn Hollingshead, MD Emergency Contacts: Extended Emergency Contact Information Primary Emergency Contact: Wainright,[name] Address: [address]          [place], [place] [address] [name] Home Phone: [phone] Mobile Phone: [phone] Relation: Sister Secondary Emergency Contact: [name],[name] Address: [address]          [place], [place] [address] [name] Home Phone: [phone] Relation: Grandaughter  Code Status: Full Code  Allergies: Tequin [gatifloxacin]  Chief Complaint  Patient presents with  . Medical Management of Chronic Issues    Routine Visit    HPI: Patient is 82 y.o. female who is being seen for routine issues of chronic CHF, anemia and depression.  Past Medical History:  Diagnosis Date  . Chronic diastolic heart failure (HCC)   . Diabetes mellitus   . Diabetes mellitus without complication (HCC)   . Gout   . Hypertension     Past Surgical History:  Procedure Laterality Date  . NO PAST SURGERIES        Medication List       Accurate as of 01/19/16 10:35 AM. Always use your most recent med list.          artificial tears Oint ophthalmic ointment Place 1 application into both eyes 4 (four) times daily.   aspirin 81 MG chewable tablet Chew 1 tablet (81 mg total) by mouth daily.   ferrous sulfate 325 (65 FE) MG tablet Take 325 mg by mouth daily with breakfast.   FLUoxetine 10 MG tablet Commonly known as:  PROZAC Take 10 mg by mouth daily. For depression   furosemide 40 MG tablet Commonly known as:  LASIX Take 40 mg by mouth every morning.   gabapentin 300 MG capsule Commonly known as:  NEURONTIN Take 1 capsule (300 mg total) by mouth at bedtime.   ipratropium-albuterol 0.5-2.5 (3) MG/3ML Soln Commonly known as:  DUONEB Take 3 mLs by  nebulization every 6 (six) hours as needed.   losartan 25 MG tablet Commonly known as:  COZAAR Take 25 mg by mouth every morning. For HTN   metFORMIN 500 MG 24 hr tablet Commonly known as:  GLUCOPHAGE-XR Take 500 mg by mouth every evening. For diabetes   NUTRITIONAL DRINK PO Give SF Medpass 4 oz by mouth twice daily for d/t weight loss   omeprazole 20 MG capsule Commonly known as:  PRILOSEC Take 20 mg by mouth daily. For GERD   potassium chloride 10 MEQ tablet Commonly known as:  K-DUR Take 30 mEq by mouth daily. For potassium   Propylene Glycol 0.6 % Soln Place 1 drop into both eyes 3 (three) times daily. For dry eyes   traMADol-acetaminophen 37.5-325 MG tablet Commonly known as:  ULTRACET Take one tablet by mouth every 6 hours as needed for moderate pain; Take two tablets by mouth every 6 hours as needed for severe pain.       Meds ordered this encounter  Medications  . ipratropium-albuterol (DUONEB) 0.5-2.5 (3) MG/3ML SOLN    Sig: Take 3 mLs by nebulization every 6 (six) hours as needed.  Marland Kitchen artificial tears (LACRILUBE) OINT ophthalmic ointment    Sig: Place 1 application into both eyes 4 (four) times daily.  . Nutritional Supplements (NUTRITIONAL DRINK PO)    Sig: Give SF Medpass 4 oz by mouth  twice daily for d/t weight loss    Immunization History  Administered Date(s) Administered  . Influenza-Unspecified 03/23/2015  . PPD Test 08/12/2013  . Pneumococcal Polysaccharide-23 06/23/2013    Social History  Substance Use Topics  . Smoking status: Former Games developer  . Smokeless tobacco: Not on file  . Alcohol use No    Review of Systems  DATA OBTAINED: from nurse GENERAL:  no fevers, fatigue, appetite changes SKIN: No itching, rash HEENT: No complaint RESPIRATORY: No cough, wheezing, SOB CARDIAC: No chest pain, palpitations, lower extremity edema  GI: No abdominal pain, No N/V/D or constipation, No heartburn or reflux  GU: No dysuria, frequency or urgency, or  incontinence  MUSCULOSKELETAL: No unrelieved bone/joint pain NEUROLOGIC: No headache, dizziness  PSYCHIATRIC: No overt anxiety or sadness  Vitals:   01/19/16 1015  BP: 120/80  Pulse: 83  Resp: 16  Temp: 98.3 F (36.8 C)    Physical Exam  GENERAL APPEARANCE: Alert, non conversant, No acute distress  SKIN: No diaphoresis rash HEENT: Unremarkable RESPIRATORY: Breathing is even, unlabored. Lung sounds are clear   CARDIOVASCULAR: Heart RRR no murmurs, rubs or gallops. No peripheral edema  GASTROINTESTINAL: Abdomen is soft, non-tender, not distended w/ normal bowel sounds.  GENITOURINARY: Bladder non tender, not distended  MUSCULOSKELETAL: No abnormal joints or musculature NEUROLOGIC: Cranial nerves 2-12 grossly intact. Moves all extremities PSYCHIATRIC: Mood and affect appropriate to situation, no behavioral issues  Patient Active Problem List   Diagnosis Date Noted  . Pain 11/07/2015  . Wheezing 11/07/2015  . Edema 10/25/2015  . Conjunctivitis 07/09/2015  . UTI (urinary tract infection) 03/24/2015  . GERD (gastroesophageal reflux disease) 12/17/2014  . Hypokalemia 12/17/2014  . Depression 11/25/2014  . Iron deficiency anemia 09/03/2014  . Chronic renal disease, stage 2, mildly decreased glomerular filtration rate (GFR) between 60-89 mL/min/1.73 square meter 09/03/2014  . Pain in joint, lower leg 08/21/2014  . Cough 01/27/2014  . Dizziness 01/12/2014  . Contusion of unspecified site 01/12/2014  . Lump in female breast 12/13/2013  . Lumbago 10/23/2013  . Type 2 diabetes, controlled, with neuropathy (HCC) 10/18/2013  . 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 Unspecified constipation 10/18/2013  . Plantar fasciitis, left 07/29/2013  . Physical deconditioning 07/21/2013  . Diabetes mellitus, controlled (HCC) 07/19/2013  . Chronic diastolic CHF (congestive heart failure) (HCC) 07/19/2013  . HCAP (healthcare-associated pneumonia)  07/18/2013  . Chronic diastolic heart failure (HCC) 06/24/2013  . Syncope 06/21/2013  . Syncope and collapse 06/21/2013  . Leukocytosis 06/21/2013  . Hypertensive heart disease with congestive heart failure (HCC) 06/21/2013  . Diabetes mellitus due to underlying condition (HCC) 06/21/2013    CBC    Component Value Date/Time   WBC 5.0 11/11/2015   WBC 4.7 07/22/2013 0420   RBC 3.56 (L) 07/22/2013 0420   HGB 11.1 (A) 11/11/2015   HCT 34 (A) 11/11/2015   PLT 177 11/11/2015   MCV 87.6 07/22/2013 0420   LYMPHSABS 1.1 07/18/2013 1910   MONOABS 0.9 07/18/2013 1910   EOSABS 0.1 07/18/2013 1910   BASOSABS 0.0 07/18/2013 1910    CMP     Component Value Date/Time   NA 140 11/11/2015   K 3.7 11/11/2015   CL 97 07/22/2013 0420   CO2 29 07/22/2013 0420   GLUCOSE 98 07/22/2013 0420   BUN 13 11/11/2015   CREATININE 0.8 11/11/2015   CREATININE 0.63 07/22/2013 0420   CALCIUM 8.7 07/22/2013 0420   PROT 7.4 07/18/2013 1910  ALBUMIN 3.1 (L) 07/18/2013 1910   AST 18 11/11/2015   ALT 10 11/11/2015   ALKPHOS 67 11/11/2015   BILITOT 0.8 07/18/2013 1910   GFRNONAA 83 (L) 07/22/2013 0420   GFRAA >90 07/22/2013 0420    Assessment and Plan  Chronic diastolic CHF (congestive heart failure) Pt has been stable without exacerbations;plan - cont cozaar and lasix 40 mg daily  Iron deficiency anemia Hb 11.8 in 12/2014 and 12.3 in 06/2015, 11.1 in 10/2015 improved with iron ;plan - cont iron daily  Depression Pt has been stable and seems very content; is out of her room daily in [facility], walks with [place], smiles a lot ;plan - cont prozac 10 mg daily  No problem-specific Assessment & Plan notes found for this encounter.   Randon Goldsmith. Lyn Hollingshead, MD

## 2016-02-10 ENCOUNTER — Encounter: Payer: Self-pay | Admitting: Internal Medicine

## 2016-02-11 ENCOUNTER — Encounter: Payer: Self-pay | Admitting: Internal Medicine

## 2016-02-11 ENCOUNTER — Non-Acute Institutional Stay (SKILLED_NURSING_FACILITY): Payer: Medicare Other | Admitting: Internal Medicine

## 2016-02-11 DIAGNOSIS — E1142 Type 2 diabetes mellitus with diabetic polyneuropathy: Secondary | ICD-10-CM

## 2016-02-11 DIAGNOSIS — E785 Hyperlipidemia, unspecified: Secondary | ICD-10-CM

## 2016-02-11 NOTE — Assessment & Plan Note (Signed)
Latest LDL 99, HDL 56; with these numbers plus an A1c of 5.7 pt does not appear to need a statin

## 2016-02-11 NOTE — Assessment & Plan Note (Signed)
A1c 5.7 on glucophage 500 mg daily; pt on ARB; not on lipid, don't think there is need

## 2016-02-11 NOTE — Assessment & Plan Note (Signed)
[facility] walks with [place] without apparent pain; plan - cont neurontin 300 mg q HS

## 2016-02-11 NOTE — Progress Notes (Signed)
MRN: [id number] Name: Margaret [name]  Sex: female Age: 82 y.o. DOB: May 09, 1932  PSC #:  [facility] [address] Level Of Care: SNF Provider: Randon GoldsmithAnne D. Lyn HollingsheadAlexander, MD Emergency Contacts: Extended Emergency Contact Information Primary Emergency Contact: Macneill,[name] Address: [address]          [place], [place] [address] [place] Home Phone: [phone] Mobile Phone: [phone] Relation: Sister Secondary Emergency Contact: [name],[name] Address: [address]          [place], [place] [address] [place] Home Phone: [phone] Relation: Grandaughter  Code Status: Full Code  Allergies: Tequin [gatifloxacin]  Chief Complaint  Patient presents with  . Medical Management of Chronic Issues    Routine Visit    HPI: Patient is 82 y.o. female who is being seen for routine issues of peripheral neuropathy, DM2 and HLD.   Past Medical History:  Diagnosis Date  . Chronic diastolic heart failure (HCC)   . Diabetes mellitus   . Diabetes mellitus without complication (HCC)   . Gout   . Hypertension     Past Surgical History:  Procedure Laterality Date  . NO PAST SURGERIES        Medication List       Accurate as of 02/11/16  7:48 PM. Always use your most recent med list.          artificial tears Oint ophthalmic ointment Place 1 application into both eyes 4 (four) times daily.   aspirin 81 MG chewable tablet Chew 1 tablet (81 mg total) by mouth daily.   ferrous sulfate 325 (65 FE) MG tablet Take 325 mg by mouth daily with breakfast.   FLUoxetine 10 MG tablet Commonly known as:  PROZAC Take 10 mg by mouth daily. For depression   furosemide 40 MG tablet Commonly known as:  LASIX Take 40 mg by mouth every morning.   gabapentin 300 MG capsule Commonly known as:  NEURONTIN Take 1 capsule (300 mg total) by mouth at bedtime.   ipratropium-albuterol 0.5-2.5 (3) MG/3ML Soln Commonly known as:  DUONEB Take 3 mLs by  nebulization every 6 (six) hours as needed.   losartan 25 MG tablet Commonly known as:  COZAAR Take 25 mg by mouth every morning. For HTN   metFORMIN 500 MG 24 hr tablet Commonly known as:  GLUCOPHAGE-XR Take 500 mg by mouth every evening. For diabetes   NUTRITIONAL DRINK PO Give SF Medpass 4 oz by mouth twice daily for d/t weight loss   omeprazole 20 MG capsule Commonly known as:  PRILOSEC Take 20 mg by mouth daily. For GERD   potassium chloride 10 MEQ tablet Commonly known as:  K-DUR Take 30 mEq by mouth daily. For potassium   Propylene Glycol 0.6 % Soln Place 1 drop into both eyes 3 (three) times daily. For dry eyes   traMADol-acetaminophen 37.5-325 MG tablet Commonly known as:  ULTRACET Take one tablet by mouth every 6 hours as needed for moderate pain; Take two tablets by mouth every 6 hours as needed for severe pain.       No orders of the defined types were placed in this encounter.   Immunization History  Administered Date(s) Administered  . Influenza-Unspecified 03/23/2015  . PPD Test 08/12/2013  . Pneumococcal Polysaccharide-23 06/23/2013    Social History  Substance Use Topics  . Smoking status: Former Games developermoker  . Smokeless tobacco: Not on file  . Alcohol use No    Review of Systems  DATA OBTAINED: from nurse GENERAL:  no fevers, fatigue, appetite changes SKIN: No itching, rash HEENT: No complaint RESPIRATORY: No cough, wheezing, SOB CARDIAC: No chest pain, palpitations, lower extremity edema  GI: No abdominal pain, No N/V/D or constipation, No heartburn or reflux  GU: No dysuria, frequency or urgency, or incontinence  MUSCULOSKELETAL: No unrelieved bone/joint pain NEUROLOGIC: No headache, dizziness  PSYCHIATRIC: No overt anxiety or sadness  Vitals:   02/11/16 0824  BP: 120/80  Pulse: 81  Resp: 20  Temp: 98.3 F (36.8 C)    Physical Exam  GENERAL APPEARANCE: Alert, min conversant, No acute distress  SKIN: No diaphoresis rash HEENT:  Unremarkable RESPIRATORY: Breathing is even, unlabored. Lung sounds are clear   CARDIOVASCULAR: Heart RRR no murmurs, rubs or gallops. No peripheral edema  GASTROINTESTINAL: Abdomen is soft, non-tender, not distended w/ normal bowel sounds.  GENITOURINARY: Bladder non tender, not distended  MUSCULOSKELETAL: No abnormal joints or musculature NEUROLOGIC: Cranial nerves 2-12 grossly intact. Moves all extremities PSYCHIATRIC: Mood and affect appropriate to situation, pt seems happy, no behavioral issues  Patient Active Problem List   Diagnosis Date Noted  . Hyperlipidemia 02/11/2016  . Pain 11/07/2015  . Wheezing 11/07/2015  . Edema 10/25/2015  . Conjunctivitis 07/09/2015  . UTI (urinary tract infection) 03/24/2015  . GERD (gastroesophageal reflux disease) 12/17/2014  . Hypokalemia 12/17/2014  . Depression 11/25/2014  . Iron deficiency anemia 09/03/2014  . Chronic renal disease, stage 2, mildly decreased glomerular filtration rate (GFR) between 60-89 mL/min/1.73 square meter 09/03/2014  . Pain in joint, lower leg 08/21/2014  . Cough 01/27/2014  . Dizziness 01/12/2014  . Contusion of unspecified site 01/12/2014  . Lump in female breast 12/13/2013  . Lumbago 10/23/2013  . Type 2 diabetes, controlled, with neuropathy (HCC) 10/18/2013  . 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 Unspecified constipation 10/18/2013  . Plantar fasciitis, left 07/29/2013  . Physical deconditioning 07/21/2013  . Diabetes mellitus, controlled (HCC) 07/19/2013  . Chronic diastolic CHF (congestive heart failure) (HCC) 07/19/2013  . HCAP (healthcare-associated pneumonia) 07/18/2013  . Chronic diastolic heart failure (HCC) 06/24/2013  . Syncope 06/21/2013  . Syncope and collapse 06/21/2013  . Leukocytosis 06/21/2013  . Hypertensive heart disease with congestive heart failure (HCC) 06/21/2013  . Diabetes mellitus due to underlying condition (HCC) 06/21/2013    CBC     Component Value Date/Time   WBC 5.0 11/11/2015   WBC 4.7 07/22/2013 0420   RBC 3.56 (L) 07/22/2013 0420   HGB 11.1 (A) 11/11/2015   HCT 34 (A) 11/11/2015   PLT 177 11/11/2015   MCV 87.6 07/22/2013 0420   LYMPHSABS 1.1 07/18/2013 1910   MONOABS 0.9 07/18/2013 1910   EOSABS 0.1 07/18/2013 1910   BASOSABS 0.0 07/18/2013 1910    CMP     Component Value Date/Time   NA 140 11/11/2015   K 3.7 11/11/2015   CL 97 07/22/2013 0420   CO2 29 07/22/2013 0420   GLUCOSE 98 07/22/2013 0420   BUN 13 11/11/2015   CREATININE 0.8 11/11/2015   CREATININE 0.63 07/22/2013 0420   CALCIUM 8.7 07/22/2013 0420   PROT 7.4 07/18/2013 1910   ALBUMIN 3.1 (L) 07/18/2013 1910   AST 18 11/11/2015   ALT 10 11/11/2015   ALKPHOS 67 11/11/2015   BILITOT 0.8 07/18/2013 1910   GFRNONAA 83 (L) 07/22/2013 0420   GFRAA >90 07/22/2013 0420    Assessment and Plan  Peripheral sensory neuropathy due to type 2 diabetes mellitus Pt  [facility] without apparent pain; plan - cont neurontin 300 mg q HS  Diabetes mellitus, controlled A1c 5.7 on glucophage 500 mg daily; pt on ARB; not on lipid, don't think there is need  Hyperlipidemia Latest LDL 99, HDL 56; with these numbers plus an A1c of 5.7 pt does not appear to need a statin   Randon GoldsmithAnne D. Lyn HollingsheadAlexander, MD

## 2016-02-16 DIAGNOSIS — F329 Major depressive disorder, single episode, unspecified: Secondary | ICD-10-CM | POA: Diagnosis not present

## 2016-02-16 DIAGNOSIS — F05 Delirium due to known physiological condition: Secondary | ICD-10-CM | POA: Diagnosis not present

## 2016-03-15 ENCOUNTER — Encounter: Payer: Self-pay | Admitting: Internal Medicine

## 2016-03-15 ENCOUNTER — Non-Acute Institutional Stay (SKILLED_NURSING_FACILITY): Payer: Medicare Other | Admitting: Internal Medicine

## 2016-03-15 DIAGNOSIS — N182 Chronic kidney disease, stage 2 (mild): Secondary | ICD-10-CM | POA: Diagnosis not present

## 2016-03-15 DIAGNOSIS — I110 Hypertensive heart disease with heart failure: Secondary | ICD-10-CM

## 2016-03-15 DIAGNOSIS — K219 Gastro-esophageal reflux disease without esophagitis: Secondary | ICD-10-CM

## 2016-03-15 NOTE — Progress Notes (Signed)
MRN: [id number] Name: Margaret [name]  Sex: female Age: 82 y.o. DOB: 09-21-1931  PSC #:  [name] [address] Level Of Care: SNF Provider: Randon Goldsmith. Lyn Hollingshead, MD Emergency Contacts: Extended Emergency Contact Information Primary Emergency Contact: Nogales,[name] Address: [address]          [place], [place] [address] [name] Home Phone: [phone] Mobile Phone: [phone] Relation: Sister Secondary Emergency Contact: [name],[name] Address: [address]          [place], [place] [address] [name] of [place] Home Phone: [phone] Relation: Grandaughter  Code Status: Full Code  Allergies: Tequin [gatifloxacin]  Chief Complaint  Patient presents with  . Medical Management of Chronic Issues    Routine Visit    HPI: Patient is 82 y.o. female who is being seen for routine issues of HTN,GERD and CKD2.  Past Medical History:  Diagnosis Date  . Chronic diastolic heart failure (HCC)   . Diabetes mellitus   . Diabetes mellitus without complication (HCC)   . Gout   . Hypertension     Past Surgical History:  Procedure Laterality Date  . NO PAST SURGERIES        Medication List       Accurate as of 03/15/16 11:59 PM. Always use your most recent med list.          artificial tears Oint ophthalmic ointment Place 1 application into both eyes 4 (four) times daily.   aspirin 81 MG chewable tablet Chew 1 tablet (81 mg total) by mouth daily.   ferrous sulfate 325 (65 FE) MG tablet Take 325 mg by mouth daily with breakfast.   FLUoxetine 10 MG tablet Commonly known as:  PROZAC Take 10 mg by mouth daily. For depression   furosemide 40 MG tablet Commonly known as:  LASIX Take 40 mg by mouth every morning.   gabapentin 300 MG capsule Commonly known as:  NEURONTIN Take 1 capsule (300 mg total) by mouth at bedtime.   ipratropium-albuterol 0.5-2.5 (3) MG/3ML Soln Commonly known as:  DUONEB Take 3 mLs by nebulization every 6 (six)  hours as needed.   losartan 25 MG tablet Commonly known as:  COZAAR Take 25 mg by mouth every morning. For HTN   metFORMIN 500 MG 24 hr tablet Commonly known as:  GLUCOPHAGE-XR Take 500 mg by mouth every evening. For diabetes   NUTRITIONAL DRINK PO Give SF Medpass 4 oz by mouth twice daily for d/t weight loss   omeprazole 20 MG capsule Commonly known as:  PRILOSEC Take 20 mg by mouth daily. For GERD   potassium chloride 10 MEQ tablet Commonly known as:  K-DUR Take 30 mEq by mouth daily. For potassium   Propylene Glycol 0.6 % Soln Place 1 drop into both eyes 3 (three) times daily. For dry eyes   traMADol-acetaminophen 37.5-325 MG tablet Commonly known as:  ULTRACET Take one tablet by mouth every 6 hours as needed for moderate pain; Take two tablets by mouth every 6 hours as needed for severe pain.       No orders of the defined types were placed in this encounter.   Immunization History  Administered Date(s) Administered  . Influenza-Unspecified 03/23/2015  . PPD Test 08/12/2013  . Pneumococcal Polysaccharide-23 06/23/2013    Social History  Substance Use Topics  . Smoking status: Former Games developer  . Smokeless tobacco: Not on file  . Alcohol use No    Review of Systems  DATA OBTAINED: from  patient, nurse GENERAL:  no fevers, fatigue, appetite changes SKIN: No itching, rash HEENT: No complaint RESPIRATORY: No cough, wheezing, SOB CARDIAC: No chest pain, palpitations, lower extremity edema  GI: No abdominal pain, No N/V/D or constipation, No heartburn or reflux  GU: No dysuria, frequency or urgency, or incontinence  MUSCULOSKELETAL: No unrelieved bone/joint pain NEUROLOGIC: No headache, dizziness  PSYCHIATRIC: No overt anxiety or sadness  Vitals:   03/15/16 1020  BP: 120/80  Pulse: 81  Resp: 20  Temp: 98.3 F (36.8 C)    Physical Exam  GENERAL APPEARANCE: Alert, min conversant, No acute distress; is in hall daily self propelling in WC, seems very  content  SKIN: No diaphoresis rash HEENT: Unremarkable RESPIRATORY: Breathing is even, unlabored. Lung sounds are clear   CARDIOVASCULAR: Heart RRR no murmurs, rubs or gallops. No peripheral edema  GASTROINTESTINAL: Abdomen is soft, non-tender, not distended w/ normal bowel sounds.  GENITOURINARY: Bladder non tender, not distended  MUSCULOSKELETAL: No abnormal joints or musculature NEUROLOGIC: Cranial nerves 2-12 grossly intact. Moves all extremities PSYCHIATRIC: Mood and affect appropriate to situation, no behavioral issues  Patient Active Problem List   Diagnosis Date Noted  . Hyperlipidemia 02/11/2016  . Pain 11/07/2015  . Wheezing 11/07/2015  . Edema 10/25/2015  . Conjunctivitis 07/09/2015  . UTI (urinary tract infection) 03/24/2015  . GERD (gastroesophageal reflux disease) 12/17/2014  . Hypokalemia 12/17/2014  . Depression 11/25/2014  . Iron deficiency anemia 09/03/2014  . Chronic renal disease, stage 2, mildly decreased glomerular filtration rate (GFR) between 60-89 mL/min/1.73 square meter 09/03/2014  . Pain in joint, lower leg 08/21/2014  . Cough 01/27/2014  . Dizziness 01/12/2014  . Contusion of unspecified site 01/12/2014  . Lump in female breast 12/13/2013  . Lumbago 10/23/2013  . Type 2 diabetes, controlled, with neuropathy (HCC) 10/18/2013  . 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 Unspecified constipation 10/18/2013  . Plantar fasciitis, left 07/29/2013  . Physical deconditioning 07/21/2013  . Diabetes mellitus, controlled (HCC) 07/19/2013  . Chronic diastolic CHF (congestive heart failure) (HCC) 07/19/2013  . HCAP (healthcare-associated pneumonia) 07/18/2013  . Chronic diastolic heart failure (HCC) 06/24/2013  . Syncope 06/21/2013  . Syncope and collapse 06/21/2013  . Leukocytosis 06/21/2013  . Hypertensive heart disease with congestive heart failure (HCC) 06/21/2013  . Diabetes mellitus due to underlying condition  (HCC) 06/21/2013    CBC    Component Value Date/Time   WBC 5.0 11/11/2015   WBC 4.7 07/22/2013 0420   RBC 3.56 (L) 07/22/2013 0420   HGB 11.1 (A) 11/11/2015   HCT 34 (A) 11/11/2015   PLT 177 11/11/2015   MCV 87.6 07/22/2013 0420   LYMPHSABS 1.1 07/18/2013 1910   MONOABS 0.9 07/18/2013 1910   EOSABS 0.1 07/18/2013 1910   BASOSABS 0.0 07/18/2013 1910    CMP     Component Value Date/Time   NA 140 11/11/2015   K 3.7 11/11/2015   CL 97 07/22/2013 0420   CO2 29 07/22/2013 0420   GLUCOSE 98 07/22/2013 0420   BUN 13 11/11/2015   CREATININE 0.8 11/11/2015   CREATININE 0.63 07/22/2013 0420   CALCIUM 8.7 07/22/2013 0420   PROT 7.4 07/18/2013 1910   ALBUMIN 3.1 (L) 07/18/2013 1910   AST 18 11/11/2015   ALT 10 11/11/2015   ALKPHOS 67 11/11/2015   BILITOT 0.8 07/18/2013 1910   GFRNONAA 83 (L) 07/22/2013 0420   GFRAA >90 07/22/2013 0420    Assessment and Plan  Hypertensive heart disease with  congestive heart failure Controlled;plan to cont cozaar 25 mg daily and lasix 40 mg daily  GERD (gastroesophageal reflux disease) No reported reflux or aspiration; plan to cont omeprazole  20 mg daily  Chronic renal disease, stage 2, mildly decreased glomerular filtration rate (GFR) between 60-89 mL/min/1.73 square meter No recent GFR; Pt's BUN/CR is 13/0.8 which is excellent and stable; will monitor at intervals    Thurston Hole D. Lyn Hollingshead, MD

## 2016-03-15 NOTE — Progress Notes (Signed)
Opened in error; Disregard.

## 2016-03-20 ENCOUNTER — Encounter: Payer: Self-pay | Admitting: Internal Medicine

## 2016-03-20 NOTE — Assessment & Plan Note (Signed)
No recent GFR; Pt's BUN/CR is 13/0.8 which is excellent and stable; will monitor at intervals

## 2016-03-20 NOTE — Assessment & Plan Note (Signed)
Controlled;plan to cont cozaar 25 mg daily and lasix 40 mg daily

## 2016-03-20 NOTE — Assessment & Plan Note (Addendum)
No reported reflux or aspiration; plan to cont omeprazole  20 mg daily

## 2016-03-22 DIAGNOSIS — E039 Hypothyroidism, unspecified: Secondary | ICD-10-CM | POA: Diagnosis not present

## 2016-03-22 DIAGNOSIS — E119 Type 2 diabetes mellitus without complications: Secondary | ICD-10-CM | POA: Diagnosis not present

## 2016-03-22 DIAGNOSIS — E559 Vitamin D deficiency, unspecified: Secondary | ICD-10-CM | POA: Diagnosis not present

## 2016-03-22 DIAGNOSIS — N181 Chronic kidney disease, stage 1: Secondary | ICD-10-CM | POA: Diagnosis not present

## 2016-03-22 DIAGNOSIS — I10 Essential (primary) hypertension: Secondary | ICD-10-CM | POA: Diagnosis not present

## 2016-03-23 DIAGNOSIS — M6281 Muscle weakness (generalized): Secondary | ICD-10-CM | POA: Diagnosis not present

## 2016-03-23 DIAGNOSIS — Z993 Dependence on wheelchair: Secondary | ICD-10-CM | POA: Diagnosis not present

## 2016-03-23 DIAGNOSIS — I5032 Chronic diastolic (congestive) heart failure: Secondary | ICD-10-CM | POA: Diagnosis not present

## 2016-03-24 DIAGNOSIS — Z993 Dependence on wheelchair: Secondary | ICD-10-CM | POA: Diagnosis not present

## 2016-03-24 DIAGNOSIS — M6281 Muscle weakness (generalized): Secondary | ICD-10-CM | POA: Diagnosis not present

## 2016-03-24 DIAGNOSIS — I5032 Chronic diastolic (congestive) heart failure: Secondary | ICD-10-CM | POA: Diagnosis not present

## 2016-03-25 DIAGNOSIS — M6281 Muscle weakness (generalized): Secondary | ICD-10-CM | POA: Diagnosis not present

## 2016-03-25 DIAGNOSIS — Z993 Dependence on wheelchair: Secondary | ICD-10-CM | POA: Diagnosis not present

## 2016-03-25 DIAGNOSIS — I5032 Chronic diastolic (congestive) heart failure: Secondary | ICD-10-CM | POA: Diagnosis not present

## 2016-03-25 LAB — CBC AND DIFFERENTIAL
HCT: 37 % (ref 36–46)
HEMOGLOBIN: 12.4 g/dL (ref 12.0–16.0)
Platelets: 177 10*3/uL (ref 150–399)
WBC: 5.2 10^3/mL

## 2016-03-25 LAB — HEPATIC FUNCTION PANEL
ALK PHOS: 76 U/L (ref 25–125)
ALT: 11 U/L (ref 7–35)
AST: 26 U/L (ref 13–35)
BILIRUBIN, TOTAL: 0.4 mg/dL

## 2016-03-25 LAB — HEMOGLOBIN A1C: HEMOGLOBIN A1C: 5.6

## 2016-03-25 LAB — TSH: TSH: 0.41 u[IU]/mL (ref 0.41–5.90)

## 2016-03-26 DIAGNOSIS — M6281 Muscle weakness (generalized): Secondary | ICD-10-CM | POA: Diagnosis not present

## 2016-03-26 DIAGNOSIS — Z993 Dependence on wheelchair: Secondary | ICD-10-CM | POA: Diagnosis not present

## 2016-03-26 DIAGNOSIS — I5032 Chronic diastolic (congestive) heart failure: Secondary | ICD-10-CM | POA: Diagnosis not present

## 2016-03-27 DIAGNOSIS — I5032 Chronic diastolic (congestive) heart failure: Secondary | ICD-10-CM | POA: Diagnosis not present

## 2016-03-27 DIAGNOSIS — Z993 Dependence on wheelchair: Secondary | ICD-10-CM | POA: Diagnosis not present

## 2016-03-27 DIAGNOSIS — M6281 Muscle weakness (generalized): Secondary | ICD-10-CM | POA: Diagnosis not present

## 2016-03-28 DIAGNOSIS — M6281 Muscle weakness (generalized): Secondary | ICD-10-CM | POA: Diagnosis not present

## 2016-03-28 DIAGNOSIS — Z993 Dependence on wheelchair: Secondary | ICD-10-CM | POA: Diagnosis not present

## 2016-03-28 DIAGNOSIS — I5032 Chronic diastolic (congestive) heart failure: Secondary | ICD-10-CM | POA: Diagnosis not present

## 2016-03-29 DIAGNOSIS — Z993 Dependence on wheelchair: Secondary | ICD-10-CM | POA: Diagnosis not present

## 2016-03-29 DIAGNOSIS — I10 Essential (primary) hypertension: Secondary | ICD-10-CM | POA: Diagnosis not present

## 2016-03-29 DIAGNOSIS — M6281 Muscle weakness (generalized): Secondary | ICD-10-CM | POA: Diagnosis not present

## 2016-03-29 DIAGNOSIS — I5032 Chronic diastolic (congestive) heart failure: Secondary | ICD-10-CM | POA: Diagnosis not present

## 2016-03-30 LAB — HEPATIC FUNCTION PANEL
ALK PHOS: 74 U/L (ref 25–125)
ALT: 13 U/L (ref 7–35)
AST: 21 U/L (ref 13–35)
BILIRUBIN, TOTAL: 0.4 mg/dL

## 2016-03-30 LAB — BASIC METABOLIC PANEL
BUN: 10 mg/dL (ref 4–21)
CREATININE: 0.8 mg/dL (ref 0.5–1.1)
Glucose: 129 mg/dL
Potassium: 4.2 mmol/L (ref 3.4–5.3)
Sodium: 142 mmol/L (ref 137–147)

## 2016-04-06 DIAGNOSIS — F329 Major depressive disorder, single episode, unspecified: Secondary | ICD-10-CM | POA: Diagnosis not present

## 2016-04-06 DIAGNOSIS — F419 Anxiety disorder, unspecified: Secondary | ICD-10-CM | POA: Diagnosis not present

## 2016-04-06 DIAGNOSIS — Z23 Encounter for immunization: Secondary | ICD-10-CM | POA: Diagnosis not present

## 2016-04-11 ENCOUNTER — Encounter: Payer: Self-pay | Admitting: Internal Medicine

## 2016-04-11 ENCOUNTER — Non-Acute Institutional Stay (SKILLED_NURSING_FACILITY): Payer: Medicare Other | Admitting: Internal Medicine

## 2016-04-11 DIAGNOSIS — D508 Other iron deficiency anemias: Secondary | ICD-10-CM

## 2016-04-11 DIAGNOSIS — E1142 Type 2 diabetes mellitus with diabetic polyneuropathy: Secondary | ICD-10-CM | POA: Diagnosis not present

## 2016-04-11 DIAGNOSIS — E559 Vitamin D deficiency, unspecified: Secondary | ICD-10-CM | POA: Diagnosis not present

## 2016-04-11 HISTORY — DX: Vitamin D deficiency, unspecified: E55.9

## 2016-04-11 NOTE — Progress Notes (Signed)
[facility] Room Number: [address] Place of Service:  SNF [phone]  [name], MD  Patient Care Team: Pcp Not In System as PCP - General  Extended Emergency Contact Information Primary Emergency Contact: Wissner,[name] Address: [address]          [place], [place] [address] [place] Home Phone: [phone] Mobile Phone: [phone] Relation: Sister Secondary Emergency Contact: [name],[name] Address: [address]          [place], [place] [address] [place] Home Phone: [phone] Relation: Grandaughter    Allergies: Tequin [gatifloxacin]  Chief Complaint  Patient presents with  . Medical Management of Chronic Issues    Routine Visit    HPI: Patient is 82 y.o. female who is being seen for routine issues of iron def anemia, Vit D def, new , and DM2.  Past Medical History:  Diagnosis Date  . Chronic diastolic heart failure (HCC)   . Diabetes mellitus   . Diabetes mellitus without complication (HCC)   . Gout   . Hypertension     Past Surgical History:  Procedure Laterality Date  . NO PAST SURGERIES        Medication List       Accurate as of 04/11/16 11:01 AM. Always use your most recent med list.          artificial tears Oint ophthalmic ointment Place 1 application into both eyes 4 (four) times daily.   aspirin 81 MG chewable tablet Chew 1 tablet (81 mg total) by mouth daily.   ferrous sulfate 325 (65 FE) MG tablet Take 325 mg by mouth daily with breakfast.   FLUoxetine 10 MG tablet Commonly known as:  PROZAC Take 10 mg by mouth daily. For depression   furosemide 40 MG tablet Commonly known as:  LASIX Take 40 mg by mouth every morning.   gabapentin 300 MG capsule Commonly known as:  NEURONTIN Take 1 capsule (300 mg total) by mouth at bedtime.   ipratropium-albuterol 0.5-2.5 (3) MG/3ML Soln Commonly known as:  DUONEB Take 3 mLs by nebulization every 6 (six) hours  as needed.   losartan 25 MG tablet Commonly known as:  COZAAR Take 25 mg by mouth every morning. For HTN   metFORMIN 500 MG 24 hr tablet Commonly known as:  GLUCOPHAGE-XR Take 500 mg by mouth every evening. For diabetes   NUTRITIONAL DRINK PO Give SF Medpass 4 oz by mouth twice daily for d/t weight loss   omeprazole 20 MG capsule Commonly known as:  PRILOSEC Take 20 mg by mouth daily. For GERD   potassium chloride 10 MEQ tablet Commonly known as:  K-DUR Take 30 mEq by mouth daily. For potassium   Propylene Glycol 0.6 % Soln Place 1 drop into both eyes 3 (three) times daily. For dry eyes   traMADol-acetaminophen 37.5-325 MG tablet Commonly known as:  ULTRACET Take one tablet by mouth every 6 hours as needed for moderate pain; Take two tablets by mouth every 6 hours as needed for severe pain.       No orders of the defined types were placed in this encounter.   Immunization History  Administered Date(s) Administered  . Influenza-Unspecified 03/23/2015, 04/06/2016  . PPD Test 08/12/2013  . Pneumococcal Polysaccharide-23 06/23/2013    Social History  Substance Use Topics  . Smoking status: Former Games developermoker  . Smokeless tobacco: Not on file  . Alcohol use No    Review  of Systems  DATA OBTAINED: from patient, nurse GENERAL:  no fevers, fatigue, appetite changes SKIN: No itching, rash HEENT: No complaint RESPIRATORY: No cough, wheezing, SOB CARDIAC: No chest pain, palpitations, lower extremity edema  GI: No abdominal pain, No N/V/D or constipation, No heartburn or reflux  GU: No dysuria, frequency or urgency, or incontinence  MUSCULOSKELETAL: No unrelieved bone/joint pain NEUROLOGIC: No headache, dizziness  PSYCHIATRIC: No overt anxiety or sadness  Vitals:   04/11/16 1007  BP: 114/68  Pulse: 78  Resp: 16  Temp: (!) 96.9 F (36.1 C)   Body mass index is 23.2 kg/m. Physical Exam  GENERAL APPEARANCE: Alert, min conversant, No acute distress  SKIN: No  diaphoresis rash HEENT: Unremarkable RESPIRATORY: Breathing is even, unlabored. Lung sounds are clear   CARDIOVASCULAR: Heart RRR no murmurs, rubs or gallops. No peripheral edema  GASTROINTESTINAL: Abdomen is soft, non-tender, not distended w/ normal bowel sounds.  GENITOURINARY: Bladder non tender, not distended  MUSCULOSKELETAL: No abnormal joints or musculature NEUROLOGIC: Cranial nerves 2-12 grossly intact. Moves all extremities PSYCHIATRIC: Mood and affect appropriate to situation, out in hall in [facility] daily , socializing, no behavioral issues  Patient Active Problem List   Diagnosis Date Noted  . Vitamin D deficiency 04/11/2016  . Hyperlipidemia 02/11/2016  . Pain 11/07/2015  . Wheezing 11/07/2015  . Edema 10/25/2015  . Conjunctivitis 07/09/2015  . UTI (urinary tract infection) 03/24/2015  . GERD (gastroesophageal reflux disease) 12/17/2014  . Hypokalemia 12/17/2014  . Depression 11/25/2014  . Iron deficiency anemia 09/03/2014  . Chronic renal disease, stage 2, mildly decreased glomerular filtration rate (GFR) between 60-89 mL/min/1.73 square meter 09/03/2014  . Pain in joint, lower leg 08/21/2014  . Cough 01/27/2014  . Dizziness 01/12/2014  . Contusion of unspecified site 01/12/2014  . Lump in female breast 12/13/2013  . Lumbago 10/23/2013  . Type 2 diabetes, controlled, with neuropathy (HCC) 10/18/2013  . 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 Unspecified constipation 10/18/2013  . Plantar fasciitis, left 07/29/2013  . Physical deconditioning 07/21/2013  . Diabetes mellitus, controlled (HCC) 07/19/2013  . Chronic diastolic CHF (congestive heart failure) (HCC) 07/19/2013  . HCAP (healthcare-associated pneumonia) 07/18/2013  . Chronic diastolic heart failure (HCC) 06/24/2013  . Syncope 06/21/2013  . Syncope and collapse 06/21/2013  . Leukocytosis 06/21/2013  . Hypertensive heart disease with congestive heart failure (HCC)  06/21/2013  . Diabetes mellitus due to underlying condition (HCC) 06/21/2013    CMP     Component Value Date/Time   NA 142 03/30/2016   K 4.2 03/30/2016   CL 97 07/22/2013 0420   CO2 29 07/22/2013 0420   GLUCOSE 98 07/22/2013 0420   BUN 10 03/30/2016   CREATININE 0.8 03/30/2016   CREATININE 0.63 07/22/2013 0420   CALCIUM 8.7 07/22/2013 0420   PROT 7.4 07/18/2013 1910   ALBUMIN 3.1 (L) 07/18/2013 1910   AST 21 03/30/2016   ALT 13 03/30/2016   ALKPHOS 74 03/30/2016   BILITOT 0.8 07/18/2013 1910   GFRNONAA 83 (L) 07/22/2013 0420   GFRAA >90 07/22/2013 0420    Recent Labs  10/08/15 11/11/15 03/30/16  NA 140 140 142  K 3.5 3.7 4.2  BUN 11 13 10   CREATININE 0.8 0.8 0.8    Recent Labs  11/11/15 03/25/16 03/30/16  AST 18 26 21   ALT 10 11 13   ALKPHOS 67 76 74    Recent Labs  10/08/15 11/11/15 03/25/16  WBC 5.4 5.0 5.2  HGB 12.3 11.1*  12.4  HCT 37 34* 37  PLT 217 177 177    Recent Labs  10/08/15  CHOL 180  LDLCALC 99  TRIG 123   No results found for: [facility] Lab Results  Component Value Date   TSH 0.41 03/25/2016   Lab Results  Component Value Date   HGBA1C 5.6 03/25/2016   Lab Results  Component Value Date   CHOL 180 10/08/2015   HDL 56 10/08/2015   LDLCALC 99 10/08/2015   TRIG 123 10/08/2015   CHOLHDL 3.2 06/22/2013    Significant Diagnostic Results in last 30 days:  No results found.  Assessment and Plan  Iron deficiency anemia 02/2016  12.4/37, improved from prior; plan to cont FeSO4 325 mg daily  Vitamin D deficiency Vit D in 02/2016 was 16 L ; have started replacement 50,000 u weekly for 16 weeks and repeAT Vit D   Diabetes mellitus, controlled A1c in Sept 5.6, contiued excellent control on gluciohage 24 hr 500 mg daily, ;on ARB, not on statin     Thurston Hole D. Lyn Hollingshead, MD

## 2016-04-11 NOTE — Assessment & Plan Note (Signed)
02/2016  12.4/37, improved from prior; plan to cont FeSO4 325 mg daily

## 2016-04-11 NOTE — Assessment & Plan Note (Signed)
A1c in Sept 5.6, contiued excellent control on gluciohage 24 hr 500 mg daily, ;on ARB, not on statin

## 2016-04-11 NOTE — Assessment & Plan Note (Signed)
Vit D in 02/2016 was 16 L ; have started replacement 50,000 u weekly for 16 weeks and repeAT Vit D

## 2016-05-03 DIAGNOSIS — H52221 Regular astigmatism, right eye: Secondary | ICD-10-CM | POA: Diagnosis not present

## 2016-05-03 DIAGNOSIS — H31092 Other chorioretinal scars, left eye: Secondary | ICD-10-CM | POA: Diagnosis not present

## 2016-05-03 DIAGNOSIS — H04123 Dry eye syndrome of bilateral lacrimal glands: Secondary | ICD-10-CM | POA: Diagnosis not present

## 2016-05-03 DIAGNOSIS — H5211 Myopia, right eye: Secondary | ICD-10-CM | POA: Diagnosis not present

## 2016-05-03 DIAGNOSIS — H11041 Peripheral pterygium, stationary, right eye: Secondary | ICD-10-CM | POA: Diagnosis not present

## 2016-05-03 DIAGNOSIS — H524 Presbyopia: Secondary | ICD-10-CM | POA: Diagnosis not present

## 2016-05-03 LAB — HM DIABETES EYE EXAM

## 2016-05-05 DIAGNOSIS — F419 Anxiety disorder, unspecified: Secondary | ICD-10-CM | POA: Diagnosis not present

## 2016-05-05 DIAGNOSIS — F329 Major depressive disorder, single episode, unspecified: Secondary | ICD-10-CM | POA: Diagnosis not present

## 2016-05-10 ENCOUNTER — Encounter: Payer: Self-pay | Admitting: Internal Medicine

## 2016-05-10 ENCOUNTER — Non-Acute Institutional Stay (SKILLED_NURSING_FACILITY): Payer: Medicare Other | Admitting: Internal Medicine

## 2016-05-10 DIAGNOSIS — K219 Gastro-esophageal reflux disease without esophagitis: Secondary | ICD-10-CM | POA: Diagnosis not present

## 2016-05-10 DIAGNOSIS — E1142 Type 2 diabetes mellitus with diabetic polyneuropathy: Secondary | ICD-10-CM

## 2016-05-10 DIAGNOSIS — I5032 Chronic diastolic (congestive) heart failure: Secondary | ICD-10-CM | POA: Diagnosis not present

## 2016-05-10 NOTE — Progress Notes (Signed)
[facility] Room Number: 205D Place of Service:  SNF [phone])  Margaret Goldsmithnne D. Lyn HollingsheadAlexander, MD  Patient Care Team: Pcp Not In System as PCP - General  Extended Emergency Contact Information Primary Emergency Contact: Salls,[name] Address: [address]          [place], [place] [address] [place] Home Phone: [phone] Mobile Phone: [phone] Relation: Sister Secondary Emergency Contact: [name],[name] Address: [address]          [place], [place] [address] [place] Home Phone: [phone] Relation: Grandaughter    Allergies: Tequin [gatifloxacin]  Chief Complaint  Patient presents with  . Medical Management of Chronic Issues    Routine Visit    HPI: Patient is 82 y.o. female who is being seen for routine issues of CHF, polyneuropathy and GERD.  Past Medical History:  Diagnosis Date  . Chronic diastolic heart failure (HCC)   . Diabetes mellitus   . Diabetes mellitus without complication (HCC)   . Gout   . Hypertension     Past Surgical History:  Procedure Laterality Date  . NO PAST SURGERIES        Medication List       Accurate as of 05/10/16 11:59 PM. Always use your most recent med list.          artificial tears Oint ophthalmic ointment Place 1 application into both eyes 4 (four) times daily.   aspirin 81 MG chewable tablet Chew 1 tablet (81 mg total) by mouth daily.   ergocalciferol 50000 units capsule Commonly known as:  VITAMIN D2 Take 50,000 Units by mouth once a week.   ferrous sulfate 325 (65 FE) MG tablet Take 325 mg by mouth daily with breakfast.   FLUoxetine 10 MG tablet Commonly known as:  PROZAC Take 10 mg by mouth daily. For depression   furosemide 40 MG tablet Commonly known as:  LASIX Take 40 mg by mouth every morning.   gabapentin 300 MG capsule Commonly known as:  NEURONTIN Take 1 capsule (300 mg total) by mouth at bedtime.   losartan 25 MG  tablet Commonly known as:  COZAAR Take 25 mg by mouth every morning. For HTN   metFORMIN 500 MG 24 hr tablet Commonly known as:  GLUCOPHAGE-XR Take 500 mg by mouth every evening. For diabetes   NUTRITIONAL DRINK PO Give SF Medpass 4 oz by mouth twice daily for d/t weight loss   omeprazole 20 MG capsule Commonly known as:  PRILOSEC Take 20 mg by mouth daily. For GERD   potassium chloride 10 MEQ tablet Commonly known as:  K-DUR Take 30 mEq by mouth daily. For potassium   Propylene Glycol 0.6 % Soln Place 1 drop into both eyes 3 (three) times daily as needed. For dry eyes   traMADol-acetaminophen 37.5-325 MG tablet Commonly known as:  ULTRACET Take 1 tablet by mouth daily. scheduled   traMADol-acetaminophen 37.5-325 MG tablet Commonly known as:  ULTRACET Take one tablet by mouth every 6 hours as needed for moderate pain; Take two tablets by mouth every 6 hours as needed for severe pain.       No orders of the defined types were placed in this encounter.   Immunization History  Administered Date(s) Administered  . Influenza-Unspecified 03/23/2015, 04/06/2016  . PPD Test 08/12/2013  . Pneumococcal Polysaccharide-23 06/23/2013    Social History  Substance Use Topics  . Smoking status: Former Games developermoker  . Smokeless tobacco: Not  on file  . Alcohol use No    Review of Systems  DATA OBTAINED: from nurse GENERAL:  no fevers, fatigue, appetite changes SKIN: No itching, rash HEENT: No complaint RESPIRATORY: No cough,occ  wheezing, SOB CARDIAC: No chest pain, palpitations, lower extremity edema  GI: No abdominal pain, No N/V/D or constipation, No heartburn or reflux  GU: No dysuria, frequency or urgency, or incontinence  MUSCULOSKELETAL: No unrelieved bone/joint pain NEUROLOGIC: No headache, dizziness  PSYCHIATRIC: No overt anxiety or sadness  Vitals:   05/10/16 0936  BP: 114/68  Pulse: 89  Resp: 18  Temp: (!) 96.9 F (36.1 C)   Body mass index is 22.98  kg/m. Physical Exam  GENERAL APPEARANCE: Alert, No acute distress  SKIN: No diaphoresis rash HEENT: Unremarkable RESPIRATORY: Breathing is even, unlabored. Lung sounds are clear   CARDIOVASCULAR: Heart RRR no murmurs, rubs or gallops. No peripheral edema  GASTROINTESTINAL: Abdomen is soft, non-tender, not distended w/ normal bowel sounds.  GENITOURINARY: Bladder non tender, not distended  MUSCULOSKELETAL: No abnormal joints or musculature NEUROLOGIC: Cranial nerves 2-12 grossly intact. Moves all extremities PSYCHIATRIC: Mood and affect appropriate to situation, no behavioral issues  Patient Active Problem List   Diagnosis Date Noted  . Vitamin D deficiency 04/11/2016  . Hyperlipidemia 02/11/2016  . Pain 11/07/2015  . Wheezing 11/07/2015  . Edema 10/25/2015  . Conjunctivitis 07/09/2015  . UTI (urinary tract infection) 03/24/2015  . GERD (gastroesophageal reflux disease) 12/17/2014  . Hypokalemia 12/17/2014  . Depression 11/25/2014  . Iron deficiency anemia 09/03/2014  . Chronic renal disease, stage 2, mildly decreased glomerular filtration rate (GFR) between 60-89 mL/min/1.73 square meter 09/03/2014  . Pain in joint, lower leg 08/21/2014  . Cough 01/27/2014  . Dizziness 01/12/2014  . Contusion of unspecified site 01/12/2014  . Lump in female breast 12/13/2013  . Lumbago 10/23/2013  . Type 2 diabetes, controlled, with neuropathy (HCC) 10/18/2013  . 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 Unspecified constipation 10/18/2013  . Plantar fasciitis, left 07/29/2013  . Physical deconditioning 07/21/2013  . Diabetes mellitus, controlled (HCC) 07/19/2013  . Chronic diastolic CHF (congestive heart failure) (HCC) 07/19/2013  . HCAP (healthcare-associated pneumonia) 07/18/2013  . Chronic diastolic heart failure (HCC) 06/24/2013  . Syncope 06/21/2013  . Syncope and collapse 06/21/2013  . Leukocytosis 06/21/2013  . Hypertensive heart disease  with congestive heart failure (HCC) 06/21/2013  . Diabetes mellitus due to underlying condition (HCC) 06/21/2013    CMP     Component Value Date/Time   NA 142 03/30/2016   K 4.2 03/30/2016   CL 97 07/22/2013 0420   CO2 29 07/22/2013 0420   GLUCOSE 98 07/22/2013 0420   BUN 10 03/30/2016   CREATININE 0.8 03/30/2016   CREATININE 0.63 07/22/2013 0420   CALCIUM 8.7 07/22/2013 0420   PROT 7.4 07/18/2013 1910   ALBUMIN 3.1 (L) 07/18/2013 1910   AST 21 03/30/2016   ALT 13 03/30/2016   ALKPHOS 74 03/30/2016   BILITOT 0.8 07/18/2013 1910   GFRNONAA 83 (L) 07/22/2013 0420   GFRAA >90 07/22/2013 0420    Recent Labs  10/08/15 11/11/15 03/30/16  NA 140 140 142  K 3.5 3.7 4.2  BUN 11 13 10   CREATININE 0.8 0.8 0.8    Recent Labs  11/11/15 03/25/16 03/30/16  AST 18 26 21   ALT 10 11 13   ALKPHOS 67 76 74    Recent Labs  10/08/15 11/11/15 03/25/16  WBC 5.4 5.0 5.2  HGB 12.3  11.1* 12.4  HCT 37 34* 37  PLT 217 177 177    Recent Labs  10/08/15  CHOL 180  LDLCALC 99  TRIG 123   No results found for: [facility] Lab Results  Component Value Date   TSH 0.41 03/25/2016   Lab Results  Component Value Date   HGBA1C 5.6 03/25/2016   Lab Results  Component Value Date   CHOL 180 10/08/2015   HDL 56 10/08/2015   LDLCALC 99 10/08/2015   TRIG 123 10/08/2015   CHOLHDL 3.2 06/22/2013    Significant Diagnostic Results in last 30 days:  No results found.  Assessment and Plan  Chronic diastolic CHF (congestive heart failure) NO EXACERBATIONS, NE EDEMA, STABLE; PLAN TO CONT lasix 40 mg daily and cozaar 25 mg daily  Peripheral sensory neuropathy due to type 2 diabetes mellitus Does not appear in pain and no reports of pain per nursing;plan to cont neurontin 300 mg po qHS  GERD (gastroesophageal reflux disease) No reports of aspiration or reflux;plan to cont omeprazole 20 mg daily   Aylene Acoff D. Lyn Hollingshead, MD

## 2016-05-12 DIAGNOSIS — M79674 Pain in right toe(s): Secondary | ICD-10-CM | POA: Diagnosis not present

## 2016-05-12 DIAGNOSIS — B351 Tinea unguium: Secondary | ICD-10-CM | POA: Diagnosis not present

## 2016-05-12 DIAGNOSIS — I70203 Unspecified atherosclerosis of native arteries of extremities, bilateral legs: Secondary | ICD-10-CM | POA: Diagnosis not present

## 2016-05-12 DIAGNOSIS — E119 Type 2 diabetes mellitus without complications: Secondary | ICD-10-CM | POA: Diagnosis not present

## 2016-05-13 ENCOUNTER — Encounter: Payer: Self-pay | Admitting: Internal Medicine

## 2016-05-13 ENCOUNTER — Non-Acute Institutional Stay (SKILLED_NURSING_FACILITY): Payer: Medicare Other | Admitting: Internal Medicine

## 2016-05-13 DIAGNOSIS — R062 Wheezing: Secondary | ICD-10-CM

## 2016-05-13 NOTE — Progress Notes (Signed)
Opened in error; Disregard.

## 2016-05-13 NOTE — Progress Notes (Signed)
Location:  [facility] Room Number: 205D Place of Service:  SNF [phone])  Margaret [name]. Margaret Hollingshead, MD  Patient Care Team: Pcp Not In System as PCP - General  Extended Emergency Contact Information Primary Emergency Contact: [name],[name] Address: [address]          [place], [place] [address] [name] Home Phone: [phone] Mobile Phone: [phone] Relation: Sister Secondary Emergency Contact: [name],[name] Address: [address]          [place], [place] [address] [name] Home Phone: [phone] Relation: Grandaughter    Allergies: Tequin [gatifloxacin]  Chief Complaint  Patient presents with  . Acute Visit    Acute    HPI: Patient is 82 y.o. female who  Drove her WC up to me in the hall and asked me to see her. She pointed to her chest so I examined her and did a pulse ox on her which was 95% on RA with a pulse of 77. Nurse told me that earlier pt had an epid=sode of wheezing which resolved with a prn neb. She has wheezing about once very 2 months. No fever or CP.  Past Medical History:  Diagnosis Date  . Chronic diastolic heart failure (HCC)   . Diabetes mellitus   . Diabetes mellitus without complication (HCC)   . Gout   . Hypertension     Past Surgical History:  Procedure Laterality Date  . NO PAST SURGERIES        Medication List       Accurate as of 05/13/16  2:21 PM. Always use your most recent med list.          artificial tears Oint ophthalmic ointment Place 1 application into both eyes 4 (four) times daily.   aspirin 81 MG chewable tablet Chew 1 tablet (81 mg total) by mouth daily.   ergocalciferol 50000 units capsule Commonly known as:  VITAMIN D2 Take 50,000 Units by mouth once a week.   ferrous sulfate 325 (65 FE) MG tablet Take 325 mg by mouth daily with breakfast.   FLUoxetine 10 MG tablet Commonly known as:  PROZAC Take 10 mg by mouth daily. For depression     furosemide 40 MG tablet Commonly known as:  LASIX Take 40 mg by mouth every morning.   gabapentin 300 MG capsule Commonly known as:  NEURONTIN Take 1 capsule (300 mg total) by mouth at bedtime.   losartan 25 MG tablet Commonly known as:  COZAAR Take 25 mg by mouth every morning. For HTN   metFORMIN 500 MG 24 hr tablet Commonly known as:  GLUCOPHAGE-XR Take 500 mg by mouth every evening. For diabetes   NUTRITIONAL DRINK PO Give SF Medpass 4 oz by mouth twice daily for d/t weight loss   omeprazole 20 MG capsule Commonly known as:  PRILOSEC Take 20 mg by mouth daily. For GERD   potassium chloride 10 MEQ tablet Commonly known as:  K-DUR Take 30 mEq by mouth daily. For potassium   Propylene Glycol 0.6 % Soln Place 1 drop into both eyes 3 (three) times daily as needed. For dry eyes   traMADol-acetaminophen 37.5-325 MG tablet Commonly known as:  ULTRACET Take 1 tablet by mouth daily. scheduled   traMADol-acetaminophen 37.5-325 MG tablet Commonly known as:  ULTRACET Take one tablet by mouth every 6 hours as needed for moderate pain; Take two tablets by mouth every 6 hours as needed for severe pain.  No orders of the defined types were placed in this encounter.   Immunization History  Administered Date(s) Administered  . Influenza-Unspecified 03/23/2015, 04/06/2016  . PPD Test 08/12/2013  . Pneumococcal Polysaccharide-23 06/23/2013    Social History  Substance Use Topics  . Smoking status: Former Games developermoker  . Smokeless tobacco: Not on file  . Alcohol use No    Review of Systems  DATA OBTAINED: from nurse GENERAL:  no fevers, fatigue, appetite changes SKIN: No itching, rash HEENT: No complaint RESPIRATORY: No cough, wheezing, SOB CARDIAC: No chest pain, palpitations, lower extremity edema  GI: No abdominal pain, No N/V/D or constipation, No heartburn or reflux  GU: No dysuria, frequency or urgency, or incontinence  MUSCULOSKELETAL: No unrelieved  bone/joint pain NEUROLOGIC: No headache, dizziness  PSYCHIATRIC: No overt anxiety or sadness  Vitals:   05/13/16 1415  BP: 114/68  Pulse: 89  Resp: 18  Temp: (!) 96.9 F (36.1 C)   Body mass index is 22.98 kg/m. Physical Exam  GENERAL APPEARANCE: Alert, min conversant, No acute distress  SKIN: No diaphoresis rash HEENT: Unremarkable RESPIRATORY: Breathing is even, unlabored. Lung sounds are clear; O2 sat RA 95%   CARDIOVASCULAR: Heart RRR no murmurs, rubs or gallops. No peripheral edema  GASTROINTESTINAL: Abdomen is soft, non-tender, not distended w/ normal bowel sounds.  GENITOURINARY: Bladder non tender, not distended  MUSCULOSKELETAL: No abnormal joints or musculature NEUROLOGIC: Cranial nerves 2-12 grossly intact. Moves all extremities PSYCHIATRIC: Mood and affect appropriate to situation, no behavioral issues  Patient Active Problem List   Diagnosis Date Noted  . Vitamin D deficiency 04/11/2016  . Hyperlipidemia 02/11/2016  . Pain 11/07/2015  . Wheezing 11/07/2015  . Edema 10/25/2015  . Conjunctivitis 07/09/2015  . UTI (urinary tract infection) 03/24/2015  . GERD (gastroesophageal reflux disease) 12/17/2014  . Hypokalemia 12/17/2014  . Depression 11/25/2014  . Iron deficiency anemia 09/03/2014  . Chronic renal disease, stage 2, mildly decreased glomerular filtration rate (GFR) between 60-89 mL/min/1.73 square meter 09/03/2014  . Pain in joint, lower leg 08/21/2014  . Cough 01/27/2014  . Dizziness 01/12/2014  . Contusion of unspecified site 01/12/2014  . Lump in female breast 12/13/2013  . Lumbago 10/23/2013  . Type 2 diabetes, controlled, with neuropathy (HCC) 10/18/2013  . 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 Unspecified constipation 10/18/2013  . Plantar fasciitis, left 07/29/2013  . Physical deconditioning 07/21/2013  . Diabetes mellitus, controlled (HCC) 07/19/2013  . Chronic diastolic CHF (congestive heart  failure) (HCC) 07/19/2013  . HCAP (healthcare-associated pneumonia) 07/18/2013  . Chronic diastolic heart failure (HCC) 06/24/2013  . Syncope 06/21/2013  . Syncope and collapse 06/21/2013  . Leukocytosis 06/21/2013  . Hypertensive heart disease with congestive heart failure (HCC) 06/21/2013  . Diabetes mellitus due to underlying condition (HCC) 06/21/2013    CMP     Component Value Date/Time   NA 142 03/30/2016   K 4.2 03/30/2016   CL 97 07/22/2013 0420   CO2 29 07/22/2013 0420   GLUCOSE 98 07/22/2013 0420   BUN 10 03/30/2016   CREATININE 0.8 03/30/2016   CREATININE 0.63 07/22/2013 0420   CALCIUM 8.7 07/22/2013 0420   PROT 7.4 07/18/2013 1910   ALBUMIN 3.1 (L) 07/18/2013 1910   AST 21 03/30/2016   ALT 13 03/30/2016   ALKPHOS 74 03/30/2016   BILITOT 0.8 07/18/2013 1910   GFRNONAA 83 (L) 07/22/2013 0420   GFRAA >90 07/22/2013 0420    Recent Labs  10/08/15 11/11/15 03/30/16  NA  140 140 142  K 3.5 3.7 4.2  BUN 11 13 10   CREATININE 0.8 0.8 0.8    Recent Labs  11/11/15 03/25/16 03/30/16  AST 18 26 21   ALT 10 11 13   ALKPHOS 67 76 74    Recent Labs  10/08/15 11/11/15 03/25/16  WBC 5.4 5.0 5.2  HGB 12.3 11.1* 12.4  HCT 37 34* 37  PLT 217 177 177    Recent Labs  10/08/15  CHOL 180  LDLCALC 99  TRIG 123   No results found for: [facility] Lab Results  Component Value Date   TSH 0.41 03/25/2016   Lab Results  Component Value Date   HGBA1C 5.6 03/25/2016   Lab Results  Component Value Date   CHOL 180 10/08/2015   HDL 56 10/08/2015   LDLCALC 99 10/08/2015   TRIG 123 10/08/2015   CHOLHDL 3.2 06/22/2013    Significant Diagnostic Results in last 30 days:  No results found.  Assessment and Plan  EPISODE OF WHEEZING, RESOLVED - will monitor      Nashya Garlington D. Margaret HollingsheadAlexander, MD

## 2016-05-14 ENCOUNTER — Encounter: Payer: Self-pay | Admitting: Internal Medicine

## 2016-05-14 NOTE — Assessment & Plan Note (Signed)
No reports of aspiration or reflux;plan to cont omeprazole 20 mg daily 

## 2016-05-14 NOTE — Assessment & Plan Note (Signed)
NO EXACERBATIONS, NE EDEMA, STABLE; PLAN TO CONT lasix 40 mg daily and cozaar 25 mg daily

## 2016-05-14 NOTE — Assessment & Plan Note (Signed)
Does not appear in pain and no reports of pain per nursing;plan to cont neurontin 300 mg po qHS

## 2016-05-15 ENCOUNTER — Encounter: Payer: Self-pay | Admitting: Internal Medicine

## 2016-05-17 DIAGNOSIS — I10 Essential (primary) hypertension: Secondary | ICD-10-CM | POA: Diagnosis not present

## 2016-05-17 LAB — LIPID PANEL
Cholesterol: 205 mg/dL — AB (ref 0–200)
HDL: 51 mg/dL (ref 35–70)
LDL Cholesterol: 131 mg/dL
Triglycerides: 119 mg/dL (ref 40–160)

## 2016-06-06 ENCOUNTER — Non-Acute Institutional Stay (SKILLED_NURSING_FACILITY): Payer: Medicare Other | Admitting: Internal Medicine

## 2016-06-06 ENCOUNTER — Encounter: Payer: Self-pay | Admitting: Internal Medicine

## 2016-06-06 DIAGNOSIS — E785 Hyperlipidemia, unspecified: Secondary | ICD-10-CM | POA: Diagnosis not present

## 2016-06-06 DIAGNOSIS — E1140 Type 2 diabetes mellitus with diabetic neuropathy, unspecified: Secondary | ICD-10-CM

## 2016-06-06 DIAGNOSIS — F329 Major depressive disorder, single episode, unspecified: Secondary | ICD-10-CM | POA: Diagnosis not present

## 2016-06-06 DIAGNOSIS — F32A Depression, unspecified: Secondary | ICD-10-CM

## 2016-06-06 NOTE — Progress Notes (Signed)
Location:  [facility] Room Number: 205D Place of Service:  SNF [phone])  Randon Goldsmith. Lyn Hollingshead, MD  Patient Care Team: Pcp Not In System as PCP - General  Extended Emergency Contact Information Primary Emergency Contact: Wiechman,[name] Address: [address]          [place], [place] [address] [name] Home Phone: [phone] Mobile Phone: [phone] Relation: Sister Secondary Emergency Contact: [name],[name] Address: [address]          [place], [place] [address] [name] of [place] Home Phone: [phone] Relation: Grandaughter    Allergies: Tequin [gatifloxacin]  Chief Complaint  Patient presents with  . Medical Management of Chronic Issues    Routine Visit    HPI: Patient is 82 y.o. female who is being seen for routine issues of Depression, HLD, DM2.  Past Medical History:  Diagnosis Date  . Chronic diastolic heart failure (HCC)   . Diabetes mellitus   . Diabetes mellitus without complication (HCC)   . Gout   . Hypertension     Past Surgical History:  Procedure Laterality Date  . NO PAST SURGERIES      Allergies as of 06/06/2016      Reactions   Tequin [gatifloxacin]       Medication List       Accurate as of 06/06/16 11:59 PM. Always use your most recent med list.          artificial tears Oint ophthalmic ointment Place 1 application into both eyes 4 (four) times daily.   aspirin 81 MG chewable tablet Chew 1 tablet (81 mg total) by mouth daily.   ergocalciferol 50000 units capsule Commonly known as:  VITAMIN D2 Take 50,000 Units by mouth once a week.   ferrous sulfate 325 (65 FE) MG tablet Take 325 mg by mouth daily with breakfast.   FLUoxetine 10 MG tablet Commonly known as:  PROZAC Take 10 mg by mouth daily. For depression   furosemide 40 MG tablet Commonly known as:  LASIX Take 40 mg by mouth every morning.   gabapentin 300 MG capsule Commonly known as:  NEURONTIN Take 1  capsule (300 mg total) by mouth at bedtime.   losartan 25 MG tablet Commonly known as:  COZAAR Take 25 mg by mouth every morning. For HTN   metFORMIN 500 MG 24 hr tablet Commonly known as:  GLUCOPHAGE-XR Take 500 mg by mouth every evening. For diabetes   NUTRITIONAL DRINK PO Give SF Medpass 4 oz by mouth twice daily for d/t weight loss   omeprazole 20 MG capsule Commonly known as:  PRILOSEC Take 20 mg by mouth daily. For GERD   potassium chloride 10 MEQ tablet Commonly known as:  K-DUR Take 30 mEq by mouth daily. For potassium   traMADol-acetaminophen 37.5-325 MG tablet Commonly known as:  ULTRACET Take 1 tablet by mouth daily. scheduled   traMADol-acetaminophen 37.5-325 MG tablet Commonly known as:  ULTRACET Take one tablet by mouth every 6 hours as needed for moderate pain; Take two tablets by mouth every 6 hours as needed for severe pain.       No orders of the defined types were placed in this encounter.   Immunization History  Administered Date(s) Administered  . Influenza-Unspecified 03/23/2015, 04/06/2016  . PPD Test 08/12/2013  . Pneumococcal Polysaccharide-23 06/23/2013    Social History  Substance Use Topics  . Smoking status: Former Games developer  . Smokeless tobacco: Not on file  . Alcohol  use No    Review of Systems  DATA OBTAINED: from patient, nurse GENERAL:  no fevers, fatigue, appetite changes SKIN: No itching, rash HEENT: No complaint RESPIRATORY: No cough, wheezing, SOB CARDIAC: No chest pain, palpitations, lower extremity edema  GI: No abdominal pain, No N/V/D or constipation, No heartburn or reflux  GU: No dysuria, frequency or urgency, or incontinence  MUSCULOSKELETAL: No unrelieved bone/joint pain NEUROLOGIC: No headache, dizziness  PSYCHIATRIC: No overt anxiety or sadness  Vitals:   06/06/16 0901  BP: 114/68  Pulse: 75  Resp: 17  Temp: (!) 96.9 F (36.1 C)   Body mass index is 22.98 kg/m. Physical Exam  GENERAL APPEARANCE:  Alert, non conversant, No acute distress  SKIN: No diaphoresis rash HEENT: Unremarkable RESPIRATORY: Breathing is even, unlabored. Lung sounds are clear   CARDIOVASCULAR: Heart RRR no murmurs, rubs or gallops. No peripheral edema  GASTROINTESTINAL: Abdomen is soft, non-tender, not distended w/ normal bowel sounds.  GENITOURINARY: Bladder non tender, not distended  MUSCULOSKELETAL: No abnormal joints or musculature NEUROLOGIC: Cranial nerves 2-12 grossly intact. Moves all extremities PSYCHIATRIC: Mood and affect appropriate to situation, no behavioral issues  Patient Active Problem List   Diagnosis Date Noted  . Vitamin D deficiency 04/11/2016  . Hyperlipidemia 02/11/2016  . Pain 11/07/2015  . Wheezing 11/07/2015  . Edema 10/25/2015  . Conjunctivitis 07/09/2015  . UTI (urinary tract infection) 03/24/2015  . GERD (gastroesophageal reflux disease) 12/17/2014  . Hypokalemia 12/17/2014  . Depression 11/25/2014  . Iron deficiency anemia 09/03/2014  . Chronic renal disease, stage 2, mildly decreased glomerular filtration rate (GFR) between 60-89 mL/min/1.73 square meter 09/03/2014  . Pain in joint, lower leg 08/21/2014  . Cough 01/27/2014  . Dizziness 01/12/2014  . Contusion of unspecified site 01/12/2014  . Lump in female breast 12/13/2013  . Lumbago 10/23/2013  . Type 2 diabetes, controlled, with neuropathy (HCC) 10/18/2013  . 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 Unspecified constipation 10/18/2013  . Plantar fasciitis, left 07/29/2013  . Physical deconditioning 07/21/2013  . Diabetes mellitus, controlled (HCC) 07/19/2013  . Chronic diastolic CHF (congestive heart failure) (HCC) 07/19/2013  . HCAP (healthcare-associated pneumonia) 07/18/2013  . Chronic diastolic heart failure (HCC) 06/24/2013  . Syncope 06/21/2013  . Syncope and collapse 06/21/2013  . Leukocytosis 06/21/2013  . Hypertensive heart disease with congestive heart failure  (HCC) 06/21/2013  . Diabetes mellitus due to underlying condition (HCC) 06/21/2013    CMP     Component Value Date/Time   NA 142 03/30/2016   K 4.2 03/30/2016   CL 97 07/22/2013 0420   CO2 29 07/22/2013 0420   GLUCOSE 98 07/22/2013 0420   BUN 10 03/30/2016   CREATININE 0.8 03/30/2016   CREATININE 0.63 07/22/2013 0420   CALCIUM 8.7 07/22/2013 0420   PROT 7.4 07/18/2013 1910   ALBUMIN 3.1 (L) 07/18/2013 1910   AST 21 03/30/2016   ALT 13 03/30/2016   ALKPHOS 74 03/30/2016   BILITOT 0.8 07/18/2013 1910   GFRNONAA 83 (L) 07/22/2013 0420   GFRAA >90 07/22/2013 0420    Recent Labs  10/08/15 11/11/15 03/30/16  NA 140 140 142  K 3.5 3.7 4.2  BUN 11 13 10   CREATININE 0.8 0.8 0.8    Recent Labs  11/11/15 03/25/16 03/30/16  AST 18 26 21   ALT 10 11 13   ALKPHOS 67 76 74    Recent Labs  10/08/15 11/11/15 03/25/16  WBC 5.4 5.0 5.2  HGB 12.3 11.1* 12.4  HCT 37 34* 37  PLT 217 177 177    Recent Labs  10/08/15 05/17/16  CHOL 180 205*  LDLCALC 99 131  TRIG 123 119   No results found for: [facility] Lab Results  Component Value Date   TSH 0.41 03/25/2016   Lab Results  Component Value Date   HGBA1C 5.6 03/25/2016   Lab Results  Component Value Date   CHOL 205 (A) 05/17/2016   HDL 51 05/17/2016   LDLCALC 131 05/17/2016   TRIG 119 05/17/2016   CHOLHDL 3.2 06/22/2013    Significant Diagnostic Results in last 30 days:  No results found.  Assessment and Plan  Depression Pt had been very stable and apears very content;plan to cont prozac 10 mg daily  Hyperlipidemia Most recent LDL is 131 but HDL of 51 is protective; A1c is 5.6 ; would not rec statin at this time; plan to cont to monitor at intervals  Type 2 diabetes, controlled, with neuropathy Recent A1c  5.6; plan to cont glucophage 24 hr 500 mg daily and ARB    Randon GoldsmithAnne D. Lyn HollingsheadAlexander, MD

## 2016-06-09 DIAGNOSIS — F419 Anxiety disorder, unspecified: Secondary | ICD-10-CM | POA: Diagnosis not present

## 2016-06-09 DIAGNOSIS — F329 Major depressive disorder, single episode, unspecified: Secondary | ICD-10-CM | POA: Diagnosis not present

## 2016-07-02 ENCOUNTER — Encounter: Payer: Self-pay | Admitting: Internal Medicine

## 2016-07-02 NOTE — Assessment & Plan Note (Signed)
Recent A1c  5.6; plan to cont glucophage 24 hr 500 mg daily and ARB

## 2016-07-02 NOTE — Assessment & Plan Note (Signed)
Pt had been very stable and apears very content;plan to cont prozac 10 mg daily

## 2016-07-02 NOTE — Assessment & Plan Note (Signed)
Most recent LDL is 131 but HDL of 51 is protective; A1c is 5.6 ; would not rec statin at this time; plan to cont to monitor at intervals

## 2016-07-07 ENCOUNTER — Non-Acute Institutional Stay (SKILLED_NURSING_FACILITY): Payer: Medicare Other | Admitting: Internal Medicine

## 2016-07-07 ENCOUNTER — Encounter: Payer: Self-pay | Admitting: Internal Medicine

## 2016-07-07 DIAGNOSIS — I110 Hypertensive heart disease with heart failure: Secondary | ICD-10-CM | POA: Diagnosis not present

## 2016-07-07 DIAGNOSIS — E559 Vitamin D deficiency, unspecified: Secondary | ICD-10-CM

## 2016-07-07 DIAGNOSIS — N182 Chronic kidney disease, stage 2 (mild): Secondary | ICD-10-CM | POA: Diagnosis not present

## 2016-07-07 NOTE — Progress Notes (Signed)
Location:  [facility] Room Number: 205D Place of Service:  SNF [phone])  Margaret [name]. Lyn Hollingshead, MD  Patient Care Team: Pcp Not In System as PCP - General  Extended Emergency Contact Information Primary Emergency Contact: Dascenzo,[name] Address: [address]          [place], [place] [address] [name] Home Phone: [phone] Mobile Phone: [phone] Relation: Sister Secondary Emergency Contact: [name],[name] Address: [address]          [place], [place] [address] [name] of [place] Home Phone: [phone] Relation: Grandaughter    Allergies: Tequin [gatifloxacin]  Chief Complaint  Patient presents with  . Medical Management of Chronic Issues    Routine Visit    HPI: Patient is 83 y.o. female who is being seeen for routine issues of HTN, Vit D ef and CKD2.  Past Medical History:  Diagnosis Date  . Chronic diastolic heart failure (HCC)   . Diabetes mellitus   . Diabetes mellitus without complication (HCC)   . Gout   . Hypertension     Past Surgical History:  Procedure Laterality Date  . NO PAST SURGERIES      Allergies as of 07/07/2016      Reactions   Tequin [gatifloxacin]       Medication List       Accurate as of 07/07/16 11:59 PM. Always use your most recent med list.          artificial tears Oint ophthalmic ointment Place 1 application into both eyes 4 (four) times daily.   aspirin 81 MG chewable tablet Chew 1 tablet (81 mg total) by mouth daily.   ergocalciferol 50000 units capsule Commonly known as:  VITAMIN D2 Take 50,000 Units by mouth once a week. Stop Date 08/03/16   ferrous sulfate 325 (65 FE) MG tablet Take 325 mg by mouth daily with breakfast.   FLUoxetine 10 MG tablet Commonly known as:  PROZAC Take 10 mg by mouth daily. For depression   furosemide 40 MG tablet Commonly known as:  LASIX Take 40 mg by mouth every morning.   gabapentin 300 MG capsule Commonly known as:   NEURONTIN Take 1 capsule (300 mg total) by mouth at bedtime.   losartan 25 MG tablet Commonly known as:  COZAAR Take 25 mg by mouth every morning. For HTN   metFORMIN 500 MG 24 hr tablet Commonly known as:  GLUCOPHAGE-XR Take 500 mg by mouth every evening. For diabetes   NUTRITIONAL DRINK PO Give SF Medpass 4 oz by mouth twice daily for d/t weight loss   omeprazole 20 MG capsule Commonly known as:  PRILOSEC Take 20 mg by mouth daily. For GERD   potassium chloride 10 MEQ tablet Commonly known as:  K-DUR Take 30 mEq by mouth daily. For potassium   traMADol-acetaminophen 37.5-325 MG tablet Commonly known as:  ULTRACET Take 1 tablet by mouth daily. scheduled   traMADol-acetaminophen 37.5-325 MG tablet Commonly known as:  ULTRACET Take one tablet by mouth every 6 hours as needed for moderate pain; Take two tablets by mouth every 6 hours as needed for severe pain.       No orders of the defined types were placed in this encounter.   Immunization History  Administered Date(s) Administered  . Influenza-Unspecified 03/23/2015, 04/06/2016  . PPD Test 08/12/2013  . Pneumococcal Polysaccharide-23 06/23/2013    Social History  Substance Use Topics  . Smoking status: Former Games developer  . Smokeless tobacco:  Not on file  . Alcohol use No    Review of Systems  DATA OBTAINED: from patient, nurse GENERAL:  no fevers, fatigue, appetite changes SKIN: No itching, rash HEENT: No complaint RESPIRATORY: No cough, wheezing, SOB CARDIAC: No chest pain, palpitations, lower extremity edema  GI: No abdominal pain, No N/V/D or constipation, No heartburn or reflux  GU: No dysuria, frequency or urgency, or incontinence  MUSCULOSKELETAL: No unrelieved bone/joint pain NEUROLOGIC: No headache, dizziness  PSYCHIATRIC: No overt anxiety or sadness  Vitals:   07/07/16 1020  BP: 114/68  Pulse: 71  Resp: 18  Temp: (!) 96.9 F (36.1 C)   Body mass index is 22.94 kg/m. Physical  Exam  GENERAL APPEARANCE: Alert, No acute distress  SKIN: No diaphoresis rash HEENT: Unremarkable RESPIRATORY: Breathing is even, unlabored. Lung sounds are clear   CARDIOVASCULAR: Heart RRR no murmurs, rubs or gallops. No peripheral edema  GASTROINTESTINAL: Abdomen is soft, non-tender, not distended w/ normal bowel sounds.  GENITOURINARY: Bladder non tender, not distended  MUSCULOSKELETAL: No abnormal joints or musculature NEUROLOGIC: Cranial nerves 2-12 grossly intact. Moves all extremities PSYCHIATRIC: Mood and affect appropriate to situation, seems happy and well adjusted no behavioral issues  Patient Active Problem List   Diagnosis Date Noted  . Vitamin D deficiency 04/11/2016  . Hyperlipidemia 02/11/2016  . Pain 11/07/2015  . Wheezing 11/07/2015  . Edema 10/25/2015  . Conjunctivitis 07/09/2015  . UTI (urinary tract infection) 03/24/2015  . GERD (gastroesophageal reflux disease) 12/17/2014  . Hypokalemia 12/17/2014  . Depression 11/25/2014  . Iron deficiency anemia 09/03/2014  . Chronic renal disease, stage 2, mildly decreased glomerular filtration rate (GFR) between 60-89 mL/min/1.73 square meter 09/03/2014  . Pain in joint, lower leg 08/21/2014  . Cough 01/27/2014  . Dizziness 01/12/2014  . Contusion of unspecified site 01/12/2014  . Lump in female breast 12/13/2013  . Lumbago 10/23/2013  . Type 2 diabetes, controlled, with neuropathy (HCC) 10/18/2013  . 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 Unspecified constipation 10/18/2013  . Plantar fasciitis, left 07/29/2013  . Physical deconditioning 07/21/2013  . Diabetes mellitus, controlled (HCC) 07/19/2013  . Chronic diastolic CHF (congestive heart failure) (HCC) 07/19/2013  . HCAP (healthcare-associated pneumonia) 07/18/2013  . Chronic diastolic heart failure (HCC) 06/24/2013  . Syncope 06/21/2013  . Syncope and collapse 06/21/2013  . Leukocytosis 06/21/2013  . Hypertensive  heart disease with congestive heart failure (HCC) 06/21/2013  . Diabetes mellitus due to underlying condition (HCC) 06/21/2013    CMP     Component Value Date/Time   NA 142 03/30/2016   K 4.2 03/30/2016   CL 97 07/22/2013 0420   CO2 29 07/22/2013 0420   GLUCOSE 98 07/22/2013 0420   BUN 10 03/30/2016   CREATININE 0.8 03/30/2016   CREATININE 0.63 07/22/2013 0420   CALCIUM 8.7 07/22/2013 0420   PROT 7.4 07/18/2013 1910   ALBUMIN 3.1 (L) 07/18/2013 1910   AST 21 03/30/2016   ALT 13 03/30/2016   ALKPHOS 74 03/30/2016   BILITOT 0.8 07/18/2013 1910   GFRNONAA 83 (L) 07/22/2013 0420   GFRAA >90 07/22/2013 0420    Recent Labs  10/08/15 11/11/15 03/30/16  NA 140 140 142  K 3.5 3.7 4.2  BUN 11 13 10   CREATININE 0.8 0.8 0.8    Recent Labs  11/11/15 03/25/16 03/30/16  AST 18 26 21   ALT 10 11 13   ALKPHOS 67 76 74    Recent Labs  10/08/15 11/11/15 03/25/16  WBC  5.4 5.0 5.2  HGB 12.3 11.1* 12.4  HCT 37 34* 37  PLT 217 177 177    Recent Labs  10/08/15 05/17/16  CHOL 180 205*  LDLCALC 99 131  TRIG 123 119   No results found for: [facility] Lab Results  Component Value Date   TSH 0.41 03/25/2016   Lab Results  Component Value Date   HGBA1C 5.6 03/25/2016   Lab Results  Component Value Date   CHOL 205 (A) 05/17/2016   HDL 51 05/17/2016   LDLCALC 131 05/17/2016   TRIG 119 05/17/2016   CHOLHDL 3.2 06/22/2013    Significant Diagnostic Results in last 30 days:  No results found.  Assessment and Plan  Hypertensive heart disease with congestive heart failure Controlled ; cont cozaar 25 mg daily and lasix 40 mg daily  Vitamin D deficiency Chronic ; plan to cont 50,000u weekly  Chronic renal disease, stage 2, mildly decreased glomerular filtration rate (GFR) between 60-89 mL/min/1.73 square meter BUN 10/ Cr 0.8 which is stable from prior;no recent GFR; will cont to monitor at intervals    Thurston Hole D. Lyn Hollingshead, MD

## 2016-07-10 ENCOUNTER — Encounter: Payer: Self-pay | Admitting: Internal Medicine

## 2016-07-10 NOTE — Assessment & Plan Note (Signed)
BUN 10/ Cr 0.8 which is stable from prior;no recent GFR; will cont to monitor at intervals

## 2016-07-10 NOTE — Assessment & Plan Note (Signed)
Chronic ; plan to cont 50,000u weekly

## 2016-07-10 NOTE — Assessment & Plan Note (Signed)
Controlled ; cont cozaar 25 mg daily and lasix 40 mg daily

## 2016-07-20 DIAGNOSIS — F419 Anxiety disorder, unspecified: Secondary | ICD-10-CM | POA: Diagnosis not present

## 2016-07-20 DIAGNOSIS — F329 Major depressive disorder, single episode, unspecified: Secondary | ICD-10-CM | POA: Diagnosis not present

## 2016-07-28 ENCOUNTER — Non-Acute Institutional Stay (SKILLED_NURSING_FACILITY): Payer: Medicare Other | Admitting: Internal Medicine

## 2016-07-28 DIAGNOSIS — Z20828 Contact with and (suspected) exposure to other viral communicable diseases: Secondary | ICD-10-CM | POA: Diagnosis not present

## 2016-07-30 DIAGNOSIS — E559 Vitamin D deficiency, unspecified: Secondary | ICD-10-CM | POA: Diagnosis not present

## 2016-07-30 LAB — VITAMIN D 25 HYDROXY (VIT D DEFICIENCY, FRACTURES): Vit D, 25-Hydroxy: 8.59

## 2016-08-08 ENCOUNTER — Non-Acute Institutional Stay (SKILLED_NURSING_FACILITY): Payer: Medicare Other | Admitting: Internal Medicine

## 2016-08-08 ENCOUNTER — Encounter: Payer: Self-pay | Admitting: Internal Medicine

## 2016-08-08 DIAGNOSIS — E1142 Type 2 diabetes mellitus with diabetic polyneuropathy: Secondary | ICD-10-CM

## 2016-08-08 DIAGNOSIS — K219 Gastro-esophageal reflux disease without esophagitis: Secondary | ICD-10-CM | POA: Diagnosis not present

## 2016-08-08 NOTE — Progress Notes (Signed)
[facility] Room Number: 205D Place of Service:  SNF [phone])  [name], MD  Patient Care Team: Pcp Not In System as PCP - General  Extended Emergency Contact Information Primary Emergency Contact: Pierotti,[name] Address: [address]          [place], [place] [address] [place] Home Phone: [phone] Mobile Phone: [phone] Relation: Sister Secondary Emergency Contact: [name],[name] Address: [address]          [place], [place] [address] [place] Home Phone: [phone] Relation: Grandaughter    Allergies: Tequin [gatifloxacin]  Chief Complaint  Patient presents with  . Medical Management of Chronic Issues    Routine Visit    HPI: Patient is 83 y.o. female who is being seen for routine issues of polyneuropathy, DM2, and GERD.  Past Medical History:  Diagnosis Date  . Chronic diastolic heart failure (HCC)   . Diabetes mellitus   . Diabetes mellitus without complication (HCC)   . Gout   . Hypertension     Past Surgical History:  Procedure Laterality Date  . NO PAST SURGERIES      Allergies as of 08/08/2016      Reactions   Tequin [gatifloxacin]       Medication List       Accurate as of 08/08/16 11:59 PM. Always use your most recent med list.          artificial tears Oint ophthalmic ointment Place 1 application into both eyes 4 (four) times daily.   aspirin 81 MG chewable tablet Chew 1 tablet (81 mg total) by mouth daily.   ferrous sulfate 325 (65 FE) MG tablet Take 325 mg by mouth daily with breakfast.   FLUoxetine 10 MG tablet Commonly known as:  PROZAC Take 10 mg by mouth daily. For depression   furosemide 40 MG tablet Commonly known as:  LASIX Take 40 mg by mouth every morning.   gabapentin 300 MG capsule Commonly known as:  NEURONTIN Take 1 capsule (300 mg total) by mouth at bedtime.   losartan 25 MG tablet Commonly known as:  COZAAR Take 25 mg  by mouth every morning. For HTN   metFORMIN 500 MG 24 hr tablet Commonly known as:  GLUCOPHAGE-XR Take 500 mg by mouth every evening. For diabetes   NUTRITIONAL DRINK PO Give SF Medpass 4 oz by mouth twice daily for d/t weight loss   omeprazole 20 MG capsule Commonly known as:  PRILOSEC Take 20 mg by mouth daily. For GERD   potassium chloride 10 MEQ tablet Commonly known as:  K-DUR Take 30 mEq by mouth daily. For potassium   traMADol-acetaminophen 37.5-325 MG tablet Commonly known as:  ULTRACET Take 1 tablet by mouth daily. scheduled   traMADol-acetaminophen 37.5-325 MG tablet Commonly known as:  ULTRACET Take one tablet by mouth every 6 hours as needed for moderate pain; Take two tablets by mouth every 6 hours as needed for severe pain.       No orders of the defined types were placed in this encounter.   Immunization History  Administered Date(s) Administered  . Influenza-Unspecified 03/23/2015, 04/06/2016  . PPD Test 08/12/2013  . Pneumococcal Polysaccharide-23 06/23/2013    Social History  Substance Use Topics  . Smoking status: Former Games developermoker  . Smokeless tobacco: Never Used  . Alcohol use No    Review of Systems  DATA OBTAINED: from nurse GENERAL:  no fevers, fatigue, appetite changes  SKIN: No itching, rash HEENT: No complaint RESPIRATORY: No cough, wheezing, SOB CARDIAC: No chest pain, palpitations, lower extremity edema  GI: No abdominal pain, No N/V/D or constipation, No heartburn or reflux  GU: No dysuria, frequency or urgency, or incontinence  MUSCULOSKELETAL: No unrelieved bone/joint pain NEUROLOGIC: No headache, dizziness  PSYCHIATRIC: No overt anxiety or sadness  Vitals:   08/08/16 0843  BP: 114/68  Pulse: 74  Resp: 18  Temp: (!) 96.9 F (36.1 C)   Body mass index is 23.18 kg/m. Physical Exam  GENERAL APPEARANCE: Alert, conversant, No acute distress  SKIN: No diaphoresis rash HEENT: Unremarkable RESPIRATORY: Breathing is even,  unlabored. Lung sounds are clear   CARDIOVASCULAR: Heart RRR no murmurs, rubs or gallops. No peripheral edema  GASTROINTESTINAL: Abdomen is soft, non-tender, not distended w/ normal bowel sounds.  GENITOURINARY: Bladder non tender, not distended  MUSCULOSKELETAL: No abnormal joints or musculature NEUROLOGIC: Cranial nerves 2-12 grossly intact. Moves all extremities PSYCHIATRIC: Mood and affect appropriate to situation, no behavioral issues  Patient Active Problem List   Diagnosis Date Noted  . Vitamin D deficiency 04/11/2016  . Hyperlipidemia 02/11/2016  . Pain 11/07/2015  . Wheezing 11/07/2015  . Edema 10/25/2015  . Conjunctivitis 07/09/2015  . UTI (urinary tract infection) 03/24/2015  . GERD (gastroesophageal reflux disease) 12/17/2014  . Hypokalemia 12/17/2014  . Depression 11/25/2014  . Iron deficiency anemia 09/03/2014  . Chronic renal disease, stage 2, mildly decreased glomerular filtration rate (GFR) between 60-89 mL/min/1.73 square meter 09/03/2014  . Pain in joint, lower leg 08/21/2014  . Cough 01/27/2014  . Dizziness 01/12/2014  . Contusion of unspecified site 01/12/2014  . Lump in female breast 12/13/2013  . Lumbago 10/23/2013  . Type 2 diabetes, controlled, with neuropathy (HCC) 10/18/2013  . 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 Unspecified constipation 10/18/2013  . Plantar fasciitis, left 07/29/2013  . Physical deconditioning 07/21/2013  . Diabetes mellitus, controlled (HCC) 07/19/2013  . Chronic diastolic CHF (congestive heart failure) (HCC) 07/19/2013  . HCAP (healthcare-associated pneumonia) 07/18/2013  . Chronic diastolic heart failure (HCC) 06/24/2013  . Syncope 06/21/2013  . Syncope and collapse 06/21/2013  . Leukocytosis 06/21/2013  . Hypertensive heart disease with congestive heart failure (HCC) 06/21/2013  . Diabetes mellitus due to underlying condition (HCC) 06/21/2013    CMP     Component Value  Date/Time   NA 142 03/30/2016   K 4.2 03/30/2016   CL 97 07/22/2013 0420   CO2 29 07/22/2013 0420   GLUCOSE 98 07/22/2013 0420   BUN 10 03/30/2016   CREATININE 0.8 03/30/2016   CREATININE 0.63 07/22/2013 0420   CALCIUM 8.7 07/22/2013 0420   PROT 7.4 07/18/2013 1910   ALBUMIN 3.1 (L) 07/18/2013 1910   AST 21 03/30/2016   ALT 13 03/30/2016   ALKPHOS 74 03/30/2016   BILITOT 0.8 07/18/2013 1910   GFRNONAA 83 (L) 07/22/2013 0420   GFRAA >90 07/22/2013 0420    Recent Labs  10/08/15 11/11/15 03/30/16  NA 140 140 142  K 3.5 3.7 4.2  BUN 11 13 10   CREATININE 0.8 0.8 0.8    Recent Labs  11/11/15 03/25/16 03/30/16  AST 18 26 21   ALT 10 11 13   ALKPHOS 67 76 74    Recent Labs  10/08/15 11/11/15 03/25/16  WBC 5.4 5.0 5.2  HGB 12.3 11.1* 12.4  HCT 37 34* 37  PLT 217 177 177    Recent Labs  10/08/15 05/17/16  CHOL 180 205*  LDLCALC 99 131  TRIG 123 119   No results found for: [facility] Lab Results  Component Value Date   TSH 0.41 03/25/2016   Lab Results  Component Value Date   HGBA1C 5.6 03/25/2016   Lab Results  Component Value Date   CHOL 205 (A) 05/17/2016   HDL 51 05/17/2016   LDLCALC 131 05/17/2016   TRIG 119 05/17/2016   CHOLHDL 3.2 06/22/2013    Significant Diagnostic Results in last 30 days:  No results found.  Assessment and Plan  Peripheral sensory neuropathy due to type 2 diabetes mellitus No reported c/o or signs of pain ; plan to cont neurontin 300 mg qHS  Diabetes mellitus, controlled A1c 5.6 ; well controlled on Glucophage XL 500 mg daily  GERD (gastroesophageal reflux disease) No c/o reflux or indigestion;plan to cont omeprazole 20 mg daily     Tyeson Tanimoto D. Lyn Hollingshead, MD

## 2016-08-18 DIAGNOSIS — F329 Major depressive disorder, single episode, unspecified: Secondary | ICD-10-CM | POA: Diagnosis not present

## 2016-08-18 DIAGNOSIS — F419 Anxiety disorder, unspecified: Secondary | ICD-10-CM | POA: Diagnosis not present

## 2016-08-21 DIAGNOSIS — R062 Wheezing: Secondary | ICD-10-CM | POA: Diagnosis not present

## 2016-08-24 DIAGNOSIS — I5032 Chronic diastolic (congestive) heart failure: Secondary | ICD-10-CM | POA: Diagnosis not present

## 2016-08-24 DIAGNOSIS — R262 Difficulty in walking, not elsewhere classified: Secondary | ICD-10-CM | POA: Diagnosis not present

## 2016-08-24 DIAGNOSIS — M545 Low back pain: Secondary | ICD-10-CM | POA: Diagnosis not present

## 2016-08-26 DIAGNOSIS — I5032 Chronic diastolic (congestive) heart failure: Secondary | ICD-10-CM | POA: Diagnosis not present

## 2016-08-26 DIAGNOSIS — M545 Low back pain: Secondary | ICD-10-CM | POA: Diagnosis not present

## 2016-08-26 DIAGNOSIS — R262 Difficulty in walking, not elsewhere classified: Secondary | ICD-10-CM | POA: Diagnosis not present

## 2016-08-26 DIAGNOSIS — R1319 Other dysphagia: Secondary | ICD-10-CM | POA: Diagnosis not present

## 2016-08-27 DIAGNOSIS — R262 Difficulty in walking, not elsewhere classified: Secondary | ICD-10-CM | POA: Diagnosis not present

## 2016-08-27 DIAGNOSIS — M545 Low back pain: Secondary | ICD-10-CM | POA: Diagnosis not present

## 2016-08-27 DIAGNOSIS — R1319 Other dysphagia: Secondary | ICD-10-CM | POA: Diagnosis not present

## 2016-08-27 DIAGNOSIS — I5032 Chronic diastolic (congestive) heart failure: Secondary | ICD-10-CM | POA: Diagnosis not present

## 2016-08-30 DIAGNOSIS — I5032 Chronic diastolic (congestive) heart failure: Secondary | ICD-10-CM | POA: Diagnosis not present

## 2016-08-30 DIAGNOSIS — R1319 Other dysphagia: Secondary | ICD-10-CM | POA: Diagnosis not present

## 2016-08-30 DIAGNOSIS — R262 Difficulty in walking, not elsewhere classified: Secondary | ICD-10-CM | POA: Diagnosis not present

## 2016-08-30 DIAGNOSIS — M545 Low back pain: Secondary | ICD-10-CM | POA: Diagnosis not present

## 2016-08-31 DIAGNOSIS — M545 Low back pain: Secondary | ICD-10-CM | POA: Diagnosis not present

## 2016-08-31 DIAGNOSIS — R262 Difficulty in walking, not elsewhere classified: Secondary | ICD-10-CM | POA: Diagnosis not present

## 2016-08-31 DIAGNOSIS — R1319 Other dysphagia: Secondary | ICD-10-CM | POA: Diagnosis not present

## 2016-08-31 DIAGNOSIS — I5032 Chronic diastolic (congestive) heart failure: Secondary | ICD-10-CM | POA: Diagnosis not present

## 2016-09-01 ENCOUNTER — Encounter: Payer: Self-pay | Admitting: Internal Medicine

## 2016-09-01 NOTE — Assessment & Plan Note (Signed)
A1c 5.6 ; well controlled on Glucophage XL 500 mg daily

## 2016-09-01 NOTE — Assessment & Plan Note (Signed)
No c/o reflux or indigestion;plan to cont omeprazole 20 mg daily

## 2016-09-01 NOTE — Assessment & Plan Note (Signed)
No reported c/o or signs of pain ; plan to cont neurontin 300 mg qHS

## 2016-09-02 ENCOUNTER — Encounter: Payer: Self-pay | Admitting: Internal Medicine

## 2016-09-02 ENCOUNTER — Non-Acute Institutional Stay (SKILLED_NURSING_FACILITY): Payer: Medicare Other | Admitting: Internal Medicine

## 2016-09-02 DIAGNOSIS — I5032 Chronic diastolic (congestive) heart failure: Secondary | ICD-10-CM

## 2016-09-02 DIAGNOSIS — M545 Low back pain: Secondary | ICD-10-CM | POA: Diagnosis not present

## 2016-09-02 DIAGNOSIS — D508 Other iron deficiency anemias: Secondary | ICD-10-CM | POA: Diagnosis not present

## 2016-09-02 DIAGNOSIS — R1319 Other dysphagia: Secondary | ICD-10-CM | POA: Diagnosis not present

## 2016-09-02 DIAGNOSIS — E785 Hyperlipidemia, unspecified: Secondary | ICD-10-CM | POA: Diagnosis not present

## 2016-09-02 DIAGNOSIS — R262 Difficulty in walking, not elsewhere classified: Secondary | ICD-10-CM | POA: Diagnosis not present

## 2016-09-02 NOTE — Progress Notes (Signed)
Location:  [facility] Room Number: 205D Place of Service:  SNF [phone])  Margaret [name]. Margaret Hollingshead, MD  [name], MD as PCP - General (Internal Medicine)  Extended Emergency Contact Information Primary Emergency Contact: Gabbard,[name] Address: [address]          [place], [place] [address] [name] Home Phone: [phone] Mobile Phone: [phone] Relation: Sister Secondary Emergency Contact: [name],[name] Address: [address]          [place], [place] [address] [name] Home Phone: [phone] Relation: Grandaughter    Allergies: Tequin [gatifloxacin]  Chief Complaint  Patient presents with  . Medical Management of Chronic Issues    Routine Visit    HPI: Patient is 83 y.o. female who is being seen for routine issues of chronic CHF, iron def anemia and HLD.  Past Medical History:  Diagnosis Date  . Chronic diastolic heart failure (HCC)   . Diabetes mellitus   . Diabetes mellitus without complication (HCC)   . Gout   . Hypertension     Past Surgical History:  Procedure Laterality Date  . NO PAST SURGERIES      Allergies as of 09/02/2016      Reactions   Tequin [gatifloxacin]       Medication List       Accurate as of 09/02/16 11:59 PM. Always use your most recent med list.          acetaminophen 325 MG tablet Commonly known as:  TYLENOL Take 650 mg by mouth every 8 (eight) hours as needed.   artificial tears Oint ophthalmic ointment Place 1 application into both eyes 4 (four) times daily.   aspirin 81 MG chewable tablet Chew 1 tablet (81 mg total) by mouth daily.   ferrous sulfate 325 (65 FE) MG tablet Take 325 mg by mouth daily with breakfast.   FLUoxetine 10 MG tablet Commonly known as:  PROZAC Take 10 mg by mouth daily. For depression   furosemide 40 MG tablet Commonly known as:  LASIX Take 40 mg by mouth every morning.   gabapentin 300 MG  capsule Commonly known as:  NEURONTIN Take 1 capsule (300 mg total) by mouth at bedtime.   ipratropium-albuterol 0.5-2.5 (3) MG/3ML Soln Commonly known as:  DUONEB Take 3 mLs by nebulization every 6 (six) hours as needed.   losartan 25 MG tablet Commonly known as:  COZAAR Take 25 mg by mouth every morning. For HTN   metFORMIN 500 MG 24 hr tablet Commonly known as:  GLUCOPHAGE-XR Take 500 mg by mouth every evening. For diabetes   NUTRITIONAL DRINK PO Give SF Medpass 4 oz by mouth twice daily for d/t weight loss   omeprazole 20 MG capsule Commonly known as:  PRILOSEC Take 20 mg by mouth daily. For GERD   potassium chloride 10 MEQ tablet Commonly known as:  K-DUR Take 30 mEq by mouth daily. For potassium   traMADol 50 MG tablet Commonly known as:  ULTRAM Take 50 mg by mouth every 6 (six) hours as needed.   traMADol-acetaminophen 37.5-325 MG tablet Commonly known as:  ULTRACET Take 2 tablets by mouth every 6 (six) hours as needed for severe pain.   Vitamin D (Ergocalciferol) 50000 units Caps capsule Commonly known as:  DRISDOL Take 50,000 Units by mouth every 7 (seven) days. On saturdays       No orders of the defined types were placed in this encounter.  Immunization History  Administered Date(s) Administered  . Influenza-Unspecified 03/23/2015, 04/06/2016  . PPD Test 08/12/2013  . Pneumococcal Polysaccharide-23 06/23/2013    Social History  Substance Use Topics  . Smoking status: Former Games developer  . Smokeless tobacco: Never Used  . Alcohol use No    Review of Systems  DATA OBTAINED: from nurse GENERAL:  no fevers, fatigue, appetite changes SKIN: No itching, rash HEENT: No complaint RESPIRATORY: No cough, wheezing, SOB CARDIAC: No chest pain, palpitations, lower extremity edema  GI: No abdominal pain, No N/V/D or constipation, No heartburn or reflux  GU: No dysuria, frequency or urgency, or incontinence  MUSCULOSKELETAL: No unrelieved bone/joint  pain NEUROLOGIC: No headache, dizziness  PSYCHIATRIC: No overt anxiety or sadness  Vitals:   09/02/16 1330  BP: 110/74  Pulse: 75  Resp: 18  Temp: 97.3 F (36.3 C)   Body mass index is 23.18 kg/m. Physical Exam  GENERAL APPEARANCE: Alert,  No acute distress  SKIN: No diaphoresis rash HEENT: Unremarkable RESPIRATORY: Breathing is even, unlabored. Lung sounds are clear   CARDIOVASCULAR: Heart RRR no murmurs, rubs or gallops. No peripheral edema  GASTROINTESTINAL: Abdomen is soft, non-tender, not distended w/ normal bowel sounds.  GENITOURINARY: Bladder non tender, not distended  MUSCULOSKELETAL: No abnormal joints or musculature NEUROLOGIC: Cranial nerves 2-12 grossly intact. Moves all extremities PSYCHIATRIC: Mood and affect appropriate to situation, no behavioral issues  Patient Active Problem List   Diagnosis Date Noted  . Vitamin D deficiency 04/11/2016  . Hyperlipidemia 02/11/2016  . Pain 11/07/2015  . Wheezing 11/07/2015  . Edema 10/25/2015  . Conjunctivitis 07/09/2015  . UTI (urinary tract infection) 03/24/2015  . GERD (gastroesophageal reflux disease) 12/17/2014  . Hypokalemia 12/17/2014  . Depression 11/25/2014  . Iron deficiency anemia 09/03/2014  . Chronic renal disease, stage 2, mildly decreased glomerular filtration rate (GFR) between 60-89 mL/min/1.73 square meter 09/03/2014  . Pain in joint, lower leg 08/21/2014  . Cough 01/27/2014  . Dizziness 01/12/2014  . Contusion of unspecified site 01/12/2014  . Lump in female breast 12/13/2013  . Lumbago 10/23/2013  . Type 2 diabetes, controlled, with neuropathy (HCC) 10/18/2013  . 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 Unspecified constipation 10/18/2013  . Plantar fasciitis, left 07/29/2013  . Physical deconditioning 07/21/2013  . Diabetes mellitus, controlled (HCC) 07/19/2013  . Chronic diastolic CHF (congestive heart failure) (HCC) 07/19/2013  . HCAP  (healthcare-associated pneumonia) 07/18/2013  . Chronic diastolic heart failure (HCC) 06/24/2013  . Syncope 06/21/2013  . Syncope and collapse 06/21/2013  . Leukocytosis 06/21/2013  . Hypertensive heart disease with congestive heart failure (HCC) 06/21/2013  . Diabetes mellitus due to underlying condition (HCC) 06/21/2013    CMP     Component Value Date/Time   NA 142 03/30/2016   K 4.2 03/30/2016   CL 97 07/22/2013 0420   CO2 29 07/22/2013 0420   GLUCOSE 98 07/22/2013 0420   BUN 10 03/30/2016   CREATININE 0.8 03/30/2016   CREATININE 0.63 07/22/2013 0420   CALCIUM 8.7 07/22/2013 0420   PROT 7.4 07/18/2013 1910   ALBUMIN 3.1 (L) 07/18/2013 1910   AST 21 03/30/2016   ALT 13 03/30/2016   ALKPHOS 74 03/30/2016   BILITOT 0.8 07/18/2013 1910   GFRNONAA 83 (L) 07/22/2013 0420   GFRAA >90 07/22/2013 0420    Recent Labs  10/08/15 11/11/15 03/30/16  NA 140 140 142  K 3.5 3.7 4.2  BUN 11 13 10   CREATININE 0.8 0.8 0.8  Recent Labs  11/11/15 03/25/16 03/30/16  AST 18 26 21   ALT 10 11 13   ALKPHOS 67 76 74    Recent Labs  10/08/15 11/11/15 03/25/16  WBC 5.4 5.0 5.2  HGB 12.3 11.1* 12.4  HCT 37 34* 37  PLT 217 177 177    Recent Labs  10/08/15 05/17/16  CHOL 180 205*  LDLCALC 99 131  TRIG 123 119   No results found for: [facility] Lab Results  Component Value Date   TSH 0.41 03/25/2016   Lab Results  Component Value Date   HGBA1C 5.6 03/25/2016   Lab Results  Component Value Date   CHOL 205 (A) 05/17/2016   HDL 51 05/17/2016   LDLCALC 131 05/17/2016   TRIG 119 05/17/2016   CHOLHDL 3.2 06/22/2013    Significant Diagnostic Results in last 30 days:  No results found.  Assessment and Plan  Chronic diastolic CHF (congestive heart failure) No exacerbations, no edema; has been very stable; plan to cont lasix 40 mg daily, and cozaar 25 mg daily  Iron deficiency anemia Hb stable; plan to cont FeSO4 325 mg daily  Hyperlipidemia HDL of 51 is protective  for the LDL of 130; A1c is 5.6; considering pt's staility do not rec statin at this time     Margaret Goldsmithnne D. Margaret HollingsheadAlexander,  MD

## 2016-09-03 DIAGNOSIS — R262 Difficulty in walking, not elsewhere classified: Secondary | ICD-10-CM | POA: Diagnosis not present

## 2016-09-03 DIAGNOSIS — R1319 Other dysphagia: Secondary | ICD-10-CM | POA: Diagnosis not present

## 2016-09-03 DIAGNOSIS — I5032 Chronic diastolic (congestive) heart failure: Secondary | ICD-10-CM | POA: Diagnosis not present

## 2016-09-03 DIAGNOSIS — M545 Low back pain: Secondary | ICD-10-CM | POA: Diagnosis not present

## 2016-09-05 DIAGNOSIS — R1319 Other dysphagia: Secondary | ICD-10-CM | POA: Diagnosis not present

## 2016-09-05 DIAGNOSIS — R262 Difficulty in walking, not elsewhere classified: Secondary | ICD-10-CM | POA: Diagnosis not present

## 2016-09-05 DIAGNOSIS — I5032 Chronic diastolic (congestive) heart failure: Secondary | ICD-10-CM | POA: Diagnosis not present

## 2016-09-05 DIAGNOSIS — M545 Low back pain: Secondary | ICD-10-CM | POA: Diagnosis not present

## 2016-09-06 ENCOUNTER — Encounter: Payer: Self-pay | Admitting: Internal Medicine

## 2016-09-06 ENCOUNTER — Non-Acute Institutional Stay (SKILLED_NURSING_FACILITY): Payer: Medicare Other | Admitting: Internal Medicine

## 2016-09-06 DIAGNOSIS — R1319 Other dysphagia: Secondary | ICD-10-CM | POA: Diagnosis not present

## 2016-09-06 DIAGNOSIS — M545 Low back pain, unspecified: Secondary | ICD-10-CM

## 2016-09-06 DIAGNOSIS — I5032 Chronic diastolic (congestive) heart failure: Secondary | ICD-10-CM | POA: Diagnosis not present

## 2016-09-06 DIAGNOSIS — R262 Difficulty in walking, not elsewhere classified: Secondary | ICD-10-CM | POA: Diagnosis not present

## 2016-09-06 NOTE — Progress Notes (Signed)
[facility]:  [facility] Room Number: 205D Place of Service:  SNF [phone])  [name], MD  Patient Care Team: Pcp Not In System as PCP - General  Extended Emergency Contact Information Primary Emergency Contact: Pooler,[name] Address: [address]          [place], [place] [address] [place] Home Phone: [phone] Mobile Phone: [phone] Relation: Sister Secondary Emergency Contact: [name],[name] Address: [address]          [place], [place] [address] [place] Home Phone: [phone] Relation: Grandaughter    Allergies: Tequin [gatifloxacin]  Chief Complaint  Patient presents with  . Acute Visit    Acute    HPI: Patient is 83 y.o. female who nursing asked me to see for low back pain . Onset today. No known trauma although there is bruising L ribs which are not tender to palpation; there is no point TTP along thoracic or lumbar spine. There is no CVA TTP.  Past Medical History:  Diagnosis Date  . Chronic diastolic heart failure (HCC)   . Diabetes mellitus   . Diabetes mellitus without complication (HCC)   . Gout   . Hypertension     Past Surgical History:  Procedure Laterality Date  . NO PAST SURGERIES      Allergies as of 09/06/2016      Reactions   Tequin [gatifloxacin]       Medication List       Accurate as of 09/06/16  1:54 PM. Always use your most recent med list.          acetaminophen 325 MG tablet Commonly known as:  TYLENOL Take 650 mg by mouth every 8 (eight) hours as needed.   artificial tears Oint ophthalmic ointment Place 1 application into both eyes 4 (four) times daily.   aspirin 81 MG chewable tablet Chew 1 tablet (81 mg total) by mouth daily.   ferrous sulfate 325 (65 FE) MG tablet Take 325 mg by mouth daily with breakfast.   FLUoxetine 10 MG tablet Commonly known as:  PROZAC Take 10 mg by mouth daily. For depression   furosemide 40 MG tablet Commonly  known as:  LASIX Take 40 mg by mouth every morning.   gabapentin 300 MG capsule Commonly known as:  NEURONTIN Take 1 capsule (300 mg total) by mouth at bedtime.   ipratropium-albuterol 0.5-2.5 (3) MG/3ML Soln Commonly known as:  DUONEB Take 3 mLs by nebulization every 6 (six) hours as needed.   losartan 25 MG tablet Commonly known as:  COZAAR Take 25 mg by mouth every morning. For HTN   metFORMIN 500 MG 24 hr tablet Commonly known as:  GLUCOPHAGE-XR Take 500 mg by mouth every evening. For diabetes   NUTRITIONAL DRINK PO Give SF Medpass 4 oz by mouth twice daily for d/t weight loss   omeprazole 20 MG capsule Commonly known as:  PRILOSEC Take 20 mg by mouth daily. For GERD   potassium chloride 10 MEQ tablet Commonly known as:  K-DUR Take 30 mEq by mouth daily. For potassium   traMADol 50 MG tablet Commonly known as:  ULTRAM Take 50 mg by mouth every 6 (six) hours as needed.   traMADol-acetaminophen 37.5-325 MG tablet Commonly known as:  ULTRACET Take 2 tablets by mouth every 6 (six) hours as needed for severe pain.   Vitamin D (Ergocalciferol) 50000 units Caps capsule Commonly known as:  DRISDOL Take 50,000 Units by mouth  every 7 (seven) days. On saturdays       No orders of the defined types were placed in this encounter.   Immunization History  Administered Date(s) Administered  . Influenza-Unspecified 03/23/2015, 04/06/2016  . PPD Test 08/12/2013  . Pneumococcal Polysaccharide-23 06/23/2013    Social History  Substance Use Topics  . Smoking status: Former Games developer  . Smokeless tobacco: Never Used  . Alcohol use No    Review of Systems  DATA OBTAINED: from patient, nurse GENERAL:  no fevers, fatigue, appetite changes SKIN: No itching, rash HEENT: No complaint RESPIRATORY: No cough, wheezing, SOB CARDIAC: No chest pain, palpitations, lower extremity edema  GI: No abdominal pain, No N/V/D or constipation, No heartburn or reflux  GU: No dysuria,  frequency or urgency, or incontinence  MUSCULOSKELETAL: No unrelieved bone/joint pain NEUROLOGIC: No headache, dizziness  PSYCHIATRIC: No overt anxiety or sadness  Vitals:   09/06/16 1352  BP: 110/74  Pulse: 69  Resp: 18  Temp: 97.3 F (36.3 C)   Body mass index is 22.25 kg/m. Physical Exam  GENERAL APPEARANCE: Alert, No acute distress  SKIN: No diaphoresis rash HEENT: Unremarkable RESPIRATORY: Breathing is even, unlabored. Lung sounds are clear   CARDIOVASCULAR: Heart RRR no murmurs, rubs or gallops. No peripheral edema; mild bruising L chest wall but no TTP GASTROINTESTINAL: Abdomen is soft, non-tender, not distended w/ normal bowel sounds.  GENITOURINARY: Bladder non tender, not distended  MUSCULOSKELETAL: no point tender along T or LS spine NEUROLOGIC: Cranial nerves 2-12 grossly intact. Moves all extremities PSYCHIATRIC: Mood and affect appropriate to situation, no behavioral issues  Patient Active Problem List   Diagnosis Date Noted  . Vitamin D deficiency 04/11/2016  . Hyperlipidemia 02/11/2016  . Pain 11/07/2015  . Wheezing 11/07/2015  . Edema 10/25/2015  . Conjunctivitis 07/09/2015  . UTI (urinary tract infection) 03/24/2015  . GERD (gastroesophageal reflux disease) 12/17/2014  . Hypokalemia 12/17/2014  . Depression 11/25/2014  . Iron deficiency anemia 09/03/2014  . Chronic renal disease, stage 2, mildly decreased glomerular filtration rate (GFR) between 60-89 mL/min/1.73 square meter 09/03/2014  . Pain in joint, lower leg 08/21/2014  . Cough 01/27/2014  . Dizziness 01/12/2014  . Contusion of unspecified site 01/12/2014  . Lump in female breast 12/13/2013  . Lumbago 10/23/2013  . Type 2 diabetes, controlled, with neuropathy (HCC) 10/18/2013  . 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 Unspecified constipation 10/18/2013  . Plantar fasciitis, left 07/29/2013  . Physical deconditioning 07/21/2013  . Diabetes mellitus,  controlled (HCC) 07/19/2013  . Chronic diastolic CHF (congestive heart failure) (HCC) 07/19/2013  . HCAP (healthcare-associated pneumonia) 07/18/2013  . Chronic diastolic heart failure (HCC) 06/24/2013  . Syncope 06/21/2013  . Syncope and collapse 06/21/2013  . Leukocytosis 06/21/2013  . Hypertensive heart disease with congestive heart failure (HCC) 06/21/2013  . Diabetes mellitus due to underlying condition (HCC) 06/21/2013    CMP     Component Value Date/Time   NA 142 03/30/2016   K 4.2 03/30/2016   CL 97 07/22/2013 0420   CO2 29 07/22/2013 0420   GLUCOSE 98 07/22/2013 0420   BUN 10 03/30/2016   CREATININE 0.8 03/30/2016   CREATININE 0.63 07/22/2013 0420   CALCIUM 8.7 07/22/2013 0420   PROT 7.4 07/18/2013 1910   ALBUMIN 3.1 (L) 07/18/2013 1910   AST 21 03/30/2016   ALT 13 03/30/2016   ALKPHOS 74 03/30/2016   BILITOT 0.8 07/18/2013 1910   GFRNONAA 83 (L) 07/22/2013 0420  GFRAA >90 07/22/2013 0420    Recent Labs  10/08/15 11/11/15 03/30/16  NA 140 140 142  K 3.5 3.7 4.2  BUN 11 13 10   CREATININE 0.8 0.8 0.8    Recent Labs  11/11/15 03/25/16 03/30/16  AST 18 26 21   ALT 10 11 13   ALKPHOS 67 76 74    Recent Labs  10/08/15 11/11/15 03/25/16  WBC 5.4 5.0 5.2  HGB 12.3 11.1* 12.4  HCT 37 34* 37  PLT 217 177 177    Recent Labs  10/08/15 05/17/16  CHOL 180 205*  LDLCALC 99 131  TRIG 123 119   No results found for: [facility] Lab Results  Component Value Date   TSH 0.41 03/25/2016   Lab Results  Component Value Date   HGBA1C 5.6 03/25/2016   Lab Results  Component Value Date   CHOL 205 (A) 05/17/2016   HDL 51 05/17/2016   LDLCALC 131 05/17/2016   TRIG 119 05/17/2016   CHOLHDL 3.2 06/22/2013    Significant Diagnostic Results in last 30 days:  No results found.  Assessment and Plan  LOW BACK PAIN - have ordered LS spine films; have ordered lidocaine patch 5% to Emryn used along with tylenol   Thurston Hole D. Lyn Hollingshead, MD

## 2016-09-07 ENCOUNTER — Encounter: Payer: Self-pay | Admitting: Internal Medicine

## 2016-09-07 DIAGNOSIS — I5032 Chronic diastolic (congestive) heart failure: Secondary | ICD-10-CM | POA: Diagnosis not present

## 2016-09-07 DIAGNOSIS — R1319 Other dysphagia: Secondary | ICD-10-CM | POA: Diagnosis not present

## 2016-09-07 DIAGNOSIS — M545 Low back pain: Secondary | ICD-10-CM | POA: Diagnosis not present

## 2016-09-07 DIAGNOSIS — R262 Difficulty in walking, not elsewhere classified: Secondary | ICD-10-CM | POA: Diagnosis not present

## 2016-09-07 NOTE — Progress Notes (Signed)
Location[facility] Room Number: 205D Place of Service:  SNF [phone])  Randon Goldsmith. Lyn Hollingshead, MD  Patient Care Team: Pcp Not In System as PCP - General  Extended Emergency Contact Information Primary Emergency Contact: Opdahl,[name] Address: [address]          [place], [place] [address] [name] Home Phone: [phone] Mobile Phone: [phone] Relation: Sister Secondary Emergency Contact: [name],[name] Address: [address]          [place], [place] [address] [name] Home Phone: [phone] Relation: Grandaughter    Allergies: Tequin [gatifloxacin]  Chief Complaint  Patient presents with  . Acute Visit    HPI: Patient is 83 y.o. female who is being seen acutely because an outbreak of Influenza A per CDC guidelines was recognized on 07/27/2016. Pt has no c/o flu like symptoms;therefore pt will need to [place] prophylaxed with Tamiflu for a minimum of 14 days per CDC protocol.  Past Medical History:  Diagnosis Date  . Chronic diastolic heart failure (HCC)   . Diabetes mellitus   . Diabetes mellitus without complication (HCC)   . Gout   . Hypertension     Past Surgical History:  Procedure Laterality Date  . NO PAST SURGERIES      Allergies as of 07/28/2016      Reactions   Tequin [gatifloxacin]       Medication List       Accurate as of 07/28/16 11:59 PM. Always use your most recent med list.          acetaminophen 325 MG tablet Commonly known as:  TYLENOL Take 650 mg by mouth every 8 (eight) hours as needed.   artificial tears Oint ophthalmic ointment Place 1 application into both eyes 4 (four) times daily.   aspirin 81 MG chewable tablet Chew 1 tablet (81 mg total) by mouth daily.   ferrous sulfate 325 (65 FE) MG tablet Take 325 mg by mouth daily with breakfast.   FLUoxetine 10 MG tablet Commonly known as:  PROZAC Take 10 mg by mouth daily. For depression   furosemide 40 MG  tablet Commonly known as:  LASIX Take 40 mg by mouth every morning.   gabapentin 300 MG capsule Commonly known as:  NEURONTIN Take 1 capsule (300 mg total) by mouth at bedtime.   ipratropium-albuterol 0.5-2.5 (3) MG/3ML Soln Commonly known as:  DUONEB Take 3 mLs by nebulization every 6 (six) hours as needed.   losartan 25 MG tablet Commonly known as:  COZAAR Take 25 mg by mouth every morning. For HTN   metFORMIN 500 MG 24 hr tablet Commonly known as:  GLUCOPHAGE-XR Take 500 mg by mouth every evening. For diabetes   NUTRITIONAL DRINK PO Give SF Medpass 4 oz by mouth twice daily for d/t weight loss   omeprazole 20 MG capsule Commonly known as:  PRILOSEC Take 20 mg by mouth daily. For GERD   potassium chloride 10 MEQ tablet Commonly known as:  K-DUR Take 30 mEq by mouth daily. For potassium   traMADol 50 MG tablet Commonly known as:  ULTRAM Take 50 mg by mouth every 6 (six) hours as needed.   traMADol-acetaminophen 37.5-325 MG tablet Commonly known as:  ULTRACET Take 2 tablets by mouth every 6 (six) hours as needed for severe pain.   Vitamin D (Ergocalciferol) 50000 units Caps capsule Commonly known as:  DRISDOL Take 50,000 Units by mouth every 7 (seven) days. On saturdays  No orders of the defined types were placed in this encounter.   Immunization History  Administered Date(s) Administered  . Influenza-Unspecified 03/23/2015, 04/06/2016  . PPD Test 08/12/2013  . Pneumococcal Polysaccharide-23 06/23/2013    Social History  Substance Use Topics  . Smoking status: Former Games developermoker  . Smokeless tobacco: Never Used  . Alcohol use No    Review of Systems  DATA OBTAINED: from patient, nurse GENERAL:  no fevers SKIN: No itching, rash HEENT: no rhinorrhea, congestion, ST or ear pain RESPIRATORY: No cough, wheezing, SOB CARDIAC: No chest pain, palpitations, lower extremity edema  GI: No abdominal pain, No N/V/D or constipation, No heartburn or reflux   MUSCULOSKELETAL: No muscle aches NEUROLOGIC: No headache, dizziness   Vitals:   07/28/16 1414  BP: 110/74  Pulse: 75  Resp: 18  Temp: 97.3 F (36.3 C)   Body mass index is 23.18 kg/m. Physical Exam  GENERAL APPEARANCE: Alert,  No acute distress  SKIN: No diaphoresis rash HEENT: Unremarkable RESPIRATORY: Breathing is even, unlabored. Lung sounds are clear   CARDIOVASCULAR: Heart RRR no murmurs, rubs or gallops. No peripheral edema  GASTROINTESTINAL: Abdomen is soft, non-tender, not distended w/ normal bowel sounds.   NEUROLOGIC: Cranial nerves 2-12 grossly intact PSYCHIATRIC: baseline, no mental status changes  Patient Active Problem List   Diagnosis Date Noted  . Vitamin D deficiency 04/11/2016  . Hyperlipidemia 02/11/2016  . Pain 11/07/2015  . Wheezing 11/07/2015  . Edema 10/25/2015  . Conjunctivitis 07/09/2015  . UTI (urinary tract infection) 03/24/2015  . GERD (gastroesophageal reflux disease) 12/17/2014  . Hypokalemia 12/17/2014  . Depression 11/25/2014  . Iron deficiency anemia 09/03/2014  . Chronic renal disease, stage 2, mildly decreased glomerular filtration rate (GFR) between 60-89 mL/min/1.73 square meter 09/03/2014  . Pain in joint, lower leg 08/21/2014  . Cough 01/27/2014  . Dizziness 01/12/2014  . Contusion of unspecified site 01/12/2014  . Lump in female breast 12/13/2013  . Lumbago 10/23/2013  . Type 2 diabetes, controlled, with neuropathy (HCC) 10/18/2013  . 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 Unspecified constipation 10/18/2013  . Plantar fasciitis, left 07/29/2013  . Physical deconditioning 07/21/2013  . Diabetes mellitus, controlled (HCC) 07/19/2013  . Chronic diastolic CHF (congestive heart failure) (HCC) 07/19/2013  . HCAP (healthcare-associated pneumonia) 07/18/2013  . Chronic diastolic heart failure (HCC) 06/24/2013  . Syncope 06/21/2013  . Syncope and collapse 06/21/2013  . Leukocytosis  06/21/2013  . Hypertensive heart disease with congestive heart failure (HCC) 06/21/2013  . Diabetes mellitus due to underlying condition (HCC) 06/21/2013    CMP     Component Value Date/Time   NA 142 03/30/2016   K 4.2 03/30/2016   CL 97 07/22/2013 0420   CO2 29 07/22/2013 0420   GLUCOSE 98 07/22/2013 0420   BUN 10 03/30/2016   CREATININE 0.8 03/30/2016   CREATININE 0.63 07/22/2013 0420   CALCIUM 8.7 07/22/2013 0420   PROT 7.4 07/18/2013 1910   ALBUMIN 3.1 (L) 07/18/2013 1910   AST 21 03/30/2016   ALT 13 03/30/2016   ALKPHOS 74 03/30/2016   BILITOT 0.8 07/18/2013 1910   GFRNONAA 83 (L) 07/22/2013 0420   GFRAA >90 07/22/2013 0420    Recent Labs  10/08/15 11/11/15 03/30/16  NA 140 140 142  K 3.5 3.7 4.2  BUN 11 13 10   CREATININE 0.8 0.8 0.8    Recent Labs  11/11/15 03/25/16 03/30/16  AST 18 26 21   ALT 10 11 13   ALKPHOS  67 76 74    Recent Labs  10/08/15 11/11/15 03/25/16  WBC 5.4 5.0 5.2  HGB 12.3 11.1* 12.4  HCT 37 34* 37  PLT 217 177 177    Recent Labs  10/08/15 05/17/16  CHOL 180 205*  LDLCALC 99 131  TRIG 123 119   No results found for: [facility] Lab Results  Component Value Date   TSH 0.41 03/25/2016   Lab Results  Component Value Date   HGBA1C 5.6 03/25/2016   Lab Results  Component Value Date   CHOL 205 (A) 05/17/2016   HDL 51 05/17/2016   LDLCALC 131 05/17/2016   TRIG 119 05/17/2016   CHOLHDL 3.2 06/22/2013    Significant Diagnostic Results in last 30 days:  No results found.  Assessment and Plan  EXPOSURE TO FLU/ INFLUENZA OUTBREAK AT SNF-   CrCl calculated by me-    41    Dose for 14 days-  30 mg daily Pt will [place] monitored daily for flu like symptoms                                                                                 Thurston Hole D. Lyn Hollingshead, MD

## 2016-09-08 DIAGNOSIS — M545 Low back pain: Secondary | ICD-10-CM | POA: Diagnosis not present

## 2016-09-08 DIAGNOSIS — I5032 Chronic diastolic (congestive) heart failure: Secondary | ICD-10-CM | POA: Diagnosis not present

## 2016-09-08 DIAGNOSIS — R262 Difficulty in walking, not elsewhere classified: Secondary | ICD-10-CM | POA: Diagnosis not present

## 2016-09-08 DIAGNOSIS — R1319 Other dysphagia: Secondary | ICD-10-CM | POA: Diagnosis not present

## 2016-09-09 DIAGNOSIS — R1319 Other dysphagia: Secondary | ICD-10-CM | POA: Diagnosis not present

## 2016-09-09 DIAGNOSIS — I5032 Chronic diastolic (congestive) heart failure: Secondary | ICD-10-CM | POA: Diagnosis not present

## 2016-09-09 DIAGNOSIS — M545 Low back pain: Secondary | ICD-10-CM | POA: Diagnosis not present

## 2016-09-09 DIAGNOSIS — R262 Difficulty in walking, not elsewhere classified: Secondary | ICD-10-CM | POA: Diagnosis not present

## 2016-09-12 DIAGNOSIS — R1319 Other dysphagia: Secondary | ICD-10-CM | POA: Diagnosis not present

## 2016-09-12 DIAGNOSIS — M545 Low back pain: Secondary | ICD-10-CM | POA: Diagnosis not present

## 2016-09-12 DIAGNOSIS — I5032 Chronic diastolic (congestive) heart failure: Secondary | ICD-10-CM | POA: Diagnosis not present

## 2016-09-12 DIAGNOSIS — R262 Difficulty in walking, not elsewhere classified: Secondary | ICD-10-CM | POA: Diagnosis not present

## 2016-09-13 DIAGNOSIS — M545 Low back pain: Secondary | ICD-10-CM | POA: Diagnosis not present

## 2016-09-13 DIAGNOSIS — R262 Difficulty in walking, not elsewhere classified: Secondary | ICD-10-CM | POA: Diagnosis not present

## 2016-09-13 DIAGNOSIS — R1319 Other dysphagia: Secondary | ICD-10-CM | POA: Diagnosis not present

## 2016-09-13 DIAGNOSIS — I5032 Chronic diastolic (congestive) heart failure: Secondary | ICD-10-CM | POA: Diagnosis not present

## 2016-09-14 DIAGNOSIS — I5032 Chronic diastolic (congestive) heart failure: Secondary | ICD-10-CM | POA: Diagnosis not present

## 2016-09-14 DIAGNOSIS — M545 Low back pain: Secondary | ICD-10-CM | POA: Diagnosis not present

## 2016-09-14 DIAGNOSIS — R262 Difficulty in walking, not elsewhere classified: Secondary | ICD-10-CM | POA: Diagnosis not present

## 2016-09-14 DIAGNOSIS — R1319 Other dysphagia: Secondary | ICD-10-CM | POA: Diagnosis not present

## 2016-09-14 LAB — HM DIABETES FOOT EXAM

## 2016-09-15 DIAGNOSIS — R262 Difficulty in walking, not elsewhere classified: Secondary | ICD-10-CM | POA: Diagnosis not present

## 2016-09-15 DIAGNOSIS — M545 Low back pain: Secondary | ICD-10-CM | POA: Diagnosis not present

## 2016-09-15 DIAGNOSIS — I5032 Chronic diastolic (congestive) heart failure: Secondary | ICD-10-CM | POA: Diagnosis not present

## 2016-09-15 DIAGNOSIS — R1319 Other dysphagia: Secondary | ICD-10-CM | POA: Diagnosis not present

## 2016-09-16 DIAGNOSIS — R1319 Other dysphagia: Secondary | ICD-10-CM | POA: Diagnosis not present

## 2016-09-16 DIAGNOSIS — I5032 Chronic diastolic (congestive) heart failure: Secondary | ICD-10-CM | POA: Diagnosis not present

## 2016-09-16 DIAGNOSIS — R262 Difficulty in walking, not elsewhere classified: Secondary | ICD-10-CM | POA: Diagnosis not present

## 2016-09-16 DIAGNOSIS — M545 Low back pain: Secondary | ICD-10-CM | POA: Diagnosis not present

## 2016-09-17 ENCOUNTER — Encounter: Payer: Self-pay | Admitting: Internal Medicine

## 2016-09-19 DIAGNOSIS — F419 Anxiety disorder, unspecified: Secondary | ICD-10-CM | POA: Diagnosis not present

## 2016-09-19 DIAGNOSIS — I5032 Chronic diastolic (congestive) heart failure: Secondary | ICD-10-CM | POA: Diagnosis not present

## 2016-09-19 DIAGNOSIS — R1319 Other dysphagia: Secondary | ICD-10-CM | POA: Diagnosis not present

## 2016-09-19 DIAGNOSIS — M545 Low back pain: Secondary | ICD-10-CM | POA: Diagnosis not present

## 2016-09-19 DIAGNOSIS — F329 Major depressive disorder, single episode, unspecified: Secondary | ICD-10-CM | POA: Diagnosis not present

## 2016-09-19 DIAGNOSIS — R262 Difficulty in walking, not elsewhere classified: Secondary | ICD-10-CM | POA: Diagnosis not present

## 2016-09-20 DIAGNOSIS — M545 Low back pain: Secondary | ICD-10-CM | POA: Diagnosis not present

## 2016-09-20 DIAGNOSIS — R262 Difficulty in walking, not elsewhere classified: Secondary | ICD-10-CM | POA: Diagnosis not present

## 2016-09-20 DIAGNOSIS — I5032 Chronic diastolic (congestive) heart failure: Secondary | ICD-10-CM | POA: Diagnosis not present

## 2016-09-20 DIAGNOSIS — R1319 Other dysphagia: Secondary | ICD-10-CM | POA: Diagnosis not present

## 2016-09-24 ENCOUNTER — Encounter: Payer: Self-pay | Admitting: Internal Medicine

## 2016-09-24 NOTE — Assessment & Plan Note (Signed)
HDL of 51 is protective for the LDL of 130; A1c is 5.6; considering pt's staility do not rec statin at this time

## 2016-09-24 NOTE — Assessment & Plan Note (Signed)
Hb stable; plan to cont FeSO4 325 mg daily

## 2016-09-24 NOTE — Assessment & Plan Note (Signed)
No exacerbations, no edema; has been very stable; plan to cont lasix 40 mg daily, and cozaar 25 mg daily

## 2016-10-09 ENCOUNTER — Emergency Department (HOSPITAL_COMMUNITY): Payer: Medicare Other

## 2016-10-09 ENCOUNTER — Encounter (HOSPITAL_COMMUNITY): Payer: Self-pay

## 2016-10-09 ENCOUNTER — Emergency Department (HOSPITAL_COMMUNITY)
Admission: EM | Admit: 2016-10-09 | Discharge: 2016-10-09 | Disposition: A | Discharge status: Home / Self Care | Payer: Medicare Other | Attending: Emergency Medicine | Admitting: Emergency Medicine

## 2016-10-09 DIAGNOSIS — I5032 Chronic diastolic (congestive) heart failure: Secondary | ICD-10-CM | POA: Insufficient documentation

## 2016-10-09 DIAGNOSIS — E1122 Type 2 diabetes mellitus with diabetic chronic kidney disease: Secondary | ICD-10-CM | POA: Diagnosis not present

## 2016-10-09 DIAGNOSIS — S199XXA Unspecified injury of neck, initial encounter: Secondary | ICD-10-CM | POA: Diagnosis not present

## 2016-10-09 DIAGNOSIS — S8001XA Contusion of right knee, initial encounter: Secondary | ICD-10-CM | POA: Insufficient documentation

## 2016-10-09 DIAGNOSIS — Z7984 Long term (current) use of oral hypoglycemic drugs: Secondary | ICD-10-CM | POA: Insufficient documentation

## 2016-10-09 DIAGNOSIS — S279XXA Injury of unspecified intrathoracic organ, initial encounter: Secondary | ICD-10-CM | POA: Diagnosis not present

## 2016-10-09 DIAGNOSIS — Z79899 Other long term (current) drug therapy: Secondary | ICD-10-CM | POA: Diagnosis not present

## 2016-10-09 DIAGNOSIS — M25521 Pain in right elbow: Secondary | ICD-10-CM | POA: Insufficient documentation

## 2016-10-09 DIAGNOSIS — W19XXXA Unspecified fall, initial encounter: Secondary | ICD-10-CM

## 2016-10-09 DIAGNOSIS — S20212A Contusion of left front wall of thorax, initial encounter: Secondary | ICD-10-CM | POA: Diagnosis not present

## 2016-10-09 DIAGNOSIS — S20219A Contusion of unspecified front wall of thorax, initial encounter: Secondary | ICD-10-CM

## 2016-10-09 DIAGNOSIS — R933 Abnormal findings on diagnostic imaging of other parts of digestive tract: Secondary | ICD-10-CM | POA: Insufficient documentation

## 2016-10-09 DIAGNOSIS — S0990XA Unspecified injury of head, initial encounter: Secondary | ICD-10-CM | POA: Diagnosis not present

## 2016-10-09 DIAGNOSIS — Z87891 Personal history of nicotine dependence: Secondary | ICD-10-CM | POA: Diagnosis not present

## 2016-10-09 DIAGNOSIS — S3991XA Unspecified injury of abdomen, initial encounter: Secondary | ICD-10-CM | POA: Diagnosis not present

## 2016-10-09 DIAGNOSIS — J439 Emphysema, unspecified: Secondary | ICD-10-CM | POA: Diagnosis not present

## 2016-10-09 DIAGNOSIS — E1140 Type 2 diabetes mellitus with diabetic neuropathy, unspecified: Secondary | ICD-10-CM | POA: Insufficient documentation

## 2016-10-09 DIAGNOSIS — N182 Chronic kidney disease, stage 2 (mild): Secondary | ICD-10-CM | POA: Diagnosis not present

## 2016-10-09 DIAGNOSIS — R531 Weakness: Secondary | ICD-10-CM | POA: Diagnosis not present

## 2016-10-09 DIAGNOSIS — D649 Anemia, unspecified: Secondary | ICD-10-CM | POA: Diagnosis not present

## 2016-10-09 DIAGNOSIS — S299XXA Unspecified injury of thorax, initial encounter: Secondary | ICD-10-CM | POA: Diagnosis present

## 2016-10-09 DIAGNOSIS — Y939 Activity, unspecified: Secondary | ICD-10-CM | POA: Diagnosis not present

## 2016-10-09 DIAGNOSIS — I130 Hypertensive heart and chronic kidney disease with heart failure and stage 1 through stage 4 chronic kidney disease, or unspecified chronic kidney disease: Secondary | ICD-10-CM | POA: Diagnosis not present

## 2016-10-09 DIAGNOSIS — R51 Headache: Secondary | ICD-10-CM | POA: Diagnosis not present

## 2016-10-09 DIAGNOSIS — Z7982 Long term (current) use of aspirin: Secondary | ICD-10-CM | POA: Insufficient documentation

## 2016-10-09 DIAGNOSIS — Y9289 Other specified places as the place of occurrence of the external cause: Secondary | ICD-10-CM | POA: Insufficient documentation

## 2016-10-09 DIAGNOSIS — W06XXXA Fall from bed, initial encounter: Secondary | ICD-10-CM | POA: Diagnosis not present

## 2016-10-09 DIAGNOSIS — Y999 Unspecified external cause status: Secondary | ICD-10-CM | POA: Insufficient documentation

## 2016-10-09 DIAGNOSIS — S59901A Unspecified injury of right elbow, initial encounter: Secondary | ICD-10-CM | POA: Diagnosis not present

## 2016-10-09 LAB — COMPREHENSIVE METABOLIC PANEL
ALBUMIN: 3.4 g/dL — AB (ref 3.5–5.0)
ALK PHOS: 73 U/L (ref 38–126)
ALT: 12 U/L — ABNORMAL LOW (ref 14–54)
AST: 19 U/L (ref 15–41)
Anion gap: 9 (ref 5–15)
BUN: 13 mg/dL (ref 6–20)
CALCIUM: 8.8 mg/dL — AB (ref 8.9–10.3)
CHLORIDE: 106 mmol/L (ref 101–111)
CO2: 26 mmol/L (ref 22–32)
CREATININE: 0.82 mg/dL (ref 0.44–1.00)
GFR calc Af Amer: >60 mL/min (ref >60)
GFR calc non Af Amer: >60 mL/min (ref >60)
GLUCOSE: 97 mg/dL (ref 65–99)
Potassium: 4.6 mmol/L (ref 3.5–5.1)
SODIUM: 141 mmol/L (ref 135–145)
Total Bilirubin: 0.5 mg/dL (ref 0.3–1.2)
Total Protein: 6.4 g/dL — ABNORMAL LOW (ref 6.5–8.1)

## 2016-10-09 LAB — CBC WITH DIFFERENTIAL/PLATELET
BASOS PCT: 0 %
Basophils Absolute: 0 10*3/uL (ref 0.0–0.1)
EOS ABS: 0.1 10*3/uL (ref 0.0–0.7)
Eosinophils Relative: 1 %
HEMATOCRIT: 36.1 % (ref 36.0–46.0)
Hemoglobin: 11.7 g/dL — ABNORMAL LOW (ref 12.0–15.0)
LYMPHS ABS: 1.2 10*3/uL (ref 0.7–4.0)
Lymphocytes Relative: 18 %
MCH: 29.5 pg (ref 26.0–34.0)
MCHC: 32.4 g/dL (ref 30.0–36.0)
MCV: 91.2 fL (ref 78.0–100.0)
MONO ABS: 0.3 10*3/uL (ref 0.1–1.0)
MONOS PCT: 4 %
NEUTROS ABS: 5 10*3/uL (ref 1.7–7.7)
NEUTROS PCT: 77 %
Platelets: 156 10*3/uL (ref 150–400)
RBC: 3.96 MIL/uL (ref 3.87–5.11)
RDW: 13.5 % (ref 11.5–15.5)
WBC: 6.6 10*3/uL (ref 4.0–10.5)

## 2016-10-09 LAB — I-STAT TROPONIN, ED: TROPONIN I, POC: 0 ng/mL (ref 0.00–0.08)

## 2016-10-09 LAB — HEPATIC FUNCTION PANEL: BILIRUBIN, TOTAL: 0.5 mg/dL

## 2016-10-09 LAB — BASIC METABOLIC PANEL
BUN: 13 mg/dL (ref 4–21)
CREATININE: 0.8 mg/dL (ref 0.5–1.1)
GLUCOSE: 97 mg/dL
SODIUM: 141 mmol/L (ref 137–147)

## 2016-10-09 LAB — LIPASE, BLOOD: Lipase: 12 U/L (ref 11–51)

## 2016-10-09 LAB — CBC AND DIFFERENTIAL: WBC: 6.6 10*3/mL

## 2016-10-09 MED ORDER — OXYCODONE-ACETAMINOPHEN 5-325 MG PO TABS
1.0000 | ORAL_TABLET | Freq: Once | ORAL | Status: AC
  Administered 2016-10-09: 1 via ORAL
  Filled 2016-10-09: qty 1

## 2016-10-09 MED ORDER — MORPHINE SULFATE (PF) 4 MG/ML IV SOLN
2.0000 mg | Freq: Once | INTRAVENOUS | Status: AC
Start: 2016-10-09 — End: 2016-10-09
  Administered 2016-10-09: 2 mg via INTRAVENOUS
  Filled 2016-10-09: qty 1

## 2016-10-09 NOTE — ED Notes (Signed)
SECOND CALL MADE TO [facility] REGARDING PICK-UP ETA, PER [facility], DUE TO THE WEATHER AND AN INFLUX OF CALLS, THEY CAN NOT GIVE ME AN ESTIMATED TIME FOR PICK-UP.

## 2016-10-09 NOTE — ED Provider Notes (Addendum)
[facility] Provider Note   CSN: [id number] Arrival date & time: 10/09/16  1206     History   Chief Complaint Chief Complaint  Patient presents with  . Fall    HPI Margaret [name] is a 83 y.o. female.  The history is provided by the patient and a relative. No language interpreter was used.  Fall    Margaret [name] is a 83 y.o. female who presents to the Emergency Department complaining of fall.  Level V caveat due to language barrier.  History is provided by the patient and her family. She is a resident at [facility] and she rolled out of bed and fell into the trashcan this morning. She reports pain to her head, chest, right elbow and right knee. No loss of consciousness. No recent illnesses. Past Medical History:  Diagnosis Date  . Chronic diastolic heart failure (HCC)   . Diabetes mellitus   . Diabetes mellitus without complication (HCC)   . Gout   . Hypertension     Patient Active Problem List   Diagnosis Date Noted  . Vitamin D deficiency 04/11/2016  . Hyperlipidemia 02/11/2016  . Pain 11/07/2015  . Wheezing 11/07/2015  . Edema 10/25/2015  . Conjunctivitis 07/09/2015  . UTI (urinary tract infection) 03/24/2015  . GERD (gastroesophageal reflux disease) 12/17/2014  . Hypokalemia 12/17/2014  . Depression 11/25/2014  . Iron deficiency anemia 09/03/2014  . Chronic renal disease, stage 2, mildly decreased glomerular filtration rate (GFR) between 60-89 mL/min/1.73 square meter 09/03/2014  . Pain in joint, lower leg 08/21/2014  . Cough 01/27/2014  . Dizziness 01/12/2014  . Contusion of unspecified site 01/12/2014  . Lump in female breast 12/13/2013  . Lumbago 10/23/2013  . Type 2 diabetes, controlled, with neuropathy (HCC) 10/18/2013  . 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 Unspecified constipation 10/18/2013  . Plantar fasciitis, left 07/29/2013  . Physical deconditioning 07/21/2013  . Diabetes mellitus, controlled  (HCC) 07/19/2013  . Chronic diastolic CHF (congestive heart failure) (HCC) 07/19/2013  . HCAP (healthcare-associated pneumonia) 07/18/2013  . Chronic diastolic heart failure (HCC) 06/24/2013  . Syncope 06/21/2013  . Syncope and collapse 06/21/2013  . Leukocytosis 06/21/2013  . Hypertensive heart disease with congestive heart failure (HCC) 06/21/2013  . Diabetes mellitus due to underlying condition (HCC) 06/21/2013    Past Surgical History:  Procedure Laterality Date  . NO PAST SURGERIES      OB History    No data available       Home Medications    Prior to Admission medications   Medication Sig Start Date End Date Taking? Authorizing Provider  acetaminophen (TYLENOL) 325 MG tablet Take 650 mg by mouth every 8 (eight) hours as needed.   Yes Historical Provider, MD  acetaminophen (TYLENOL) 500 MG tablet Take 500 mg by mouth 3 (three) times daily.   Yes Historical Provider, MD  artificial tears (LACRILUBE) OINT ophthalmic ointment Place 1 application into both eyes 4 (four) times daily.   Yes Historical Provider, MD  aspirin 81 MG chewable tablet Chew 1 tablet (81 mg total) by mouth daily. 06/24/13  Yes Hollice Espy, MD  ferrous sulfate 325 (65 FE) MG tablet Take 325 mg by mouth daily with breakfast.    Yes Historical Provider, MD  furosemide (LASIX) 40 MG tablet Take 40 mg by mouth every morning.  04/08/13  Yes Historical Provider, MD  gabapentin (NEURONTIN) 300 MG capsule Take 1 capsule (300 mg total) by  mouth at bedtime. 07/22/13  Yes David Tat, MD  ipratropium-albuterol (DUONEB) 0.5-2.5 (3) MG/3ML SOLN Take 3 mLs by nebulization every 6 (six) hours as needed.   Yes Historical Provider, MD  lidocaine (LIDODERM) 5 % Place 1 patch onto the skin daily. Remove & Discard patch within 12 hours or as directed by MD   Yes Historical Provider, MD  losartan (COZAAR) 25 MG tablet Take 25 mg by mouth every morning. For HTN   Yes Historical Provider, MD  metFORMIN (GLUCOPHAGE-XR) 500 MG  24 hr tablet Take 500 mg by mouth every evening. For diabetes   Yes Historical Provider, MD  omeprazole (PRILOSEC) 20 MG capsule Take 20 mg by mouth daily. For GERD   Yes Historical Provider, MD  potassium chloride (K-DUR) 10 MEQ tablet Take 30 mEq by mouth daily. For potassium   Yes Historical Provider, MD  traMADol (ULTRAM) 50 MG tablet Take 50 mg by mouth every 6 (six) hours as needed.   Yes Historical Provider, MD  traMADol-acetaminophen (ULTRACET) 37.5-325 MG tablet Take 2 tablets by mouth every 6 (six) hours as needed for severe pain.   Yes Historical Provider, MD  Vitamin D, Ergocalciferol, (DRISDOL) 50000 units CAPS capsule Take 50,000 Units by mouth every 7 (seven) days. On saturdays 08/13/16 12/03/16 Yes Historical Provider, MD    Family History History reviewed. No pertinent family history.  Social History Social History  Substance Use Topics  . Smoking status: Former Games developer  . Smokeless tobacco: Never Used  . Alcohol use No     Allergies   Tequin [gatifloxacin]   Review of Systems Review of Systems  All other systems reviewed and are negative.    Physical Exam Updated Vital Signs BP 125/74   Pulse 81   Temp 98.1 F (36.7 C) (Oral)   Resp 17   Ht 5' (1.524 m)   Wt 102 lb (46.3 kg)   SpO2 92%   BMI 19.92 kg/m   Physical Exam  Constitutional: She is oriented to person, place, and time. She appears well-developed and well-nourished.  HENT:  Head: Normocephalic and atraumatic.  Cardiovascular: Normal rate and regular rhythm.   No murmur heard. Pulmonary/Chest: Effort normal and breath sounds normal. No respiratory distress.  Ecchymosis over the lower chest wall with tenderness to palpation over the left chest.  Abdominal: Soft. There is no tenderness. There is no rebound and no guarding.  Musculoskeletal: She exhibits no tenderness.  Swelling and tenderness to the right knee with flexion and extension intact. There is mild tenderness over the right elbow with  full range of motion intact. Ecchymosis over the left dorsal hand with no tenderness, full range of motion intact.  Neurological: She is alert and oriented to person, place, and time.  Skin: Skin is warm and dry.  Psychiatric: She has a normal mood and affect. Her behavior is normal.  Nursing note and vitals reviewed.    ED Treatments / Results  Labs (all labs ordered are listed, but only abnormal results are displayed) Labs Reviewed  COMPREHENSIVE METABOLIC PANEL - Abnormal; Notable for the following:       Result Value   Calcium 8.8 (*)    Total Protein 6.4 (*)    Albumin 3.4 (*)    ALT 12 (*)    All other components within normal limits  CBC WITH DIFFERENTIAL/PLATELET - Abnormal; Notable for the following:    Hemoglobin 11.7 (*)    All other components within normal limits  LIPASE, BLOOD  I-STAT  TROPOININ, ED    EKG  EKG Interpretation  Date/Time:  Sunday October 09 2016 14:21:48 EDT Ventricular Rate:  84 PR Interval:    QRS Duration: 92 QT Interval:  390 QTC Calculation: 461 R Axis:   42 Text Interpretation:  Sinus rhythm Low voltage, extremity and precordial leads Confirmed by Lincoln Brigham [phone]) on 10/09/2016 2:49:13 PM       Radiology Ct Abdomen Pelvis Wo Contrast  Result Date: 10/09/2016 CLINICAL DATA:  Unwitnessed fall. Low back pain. Hard of hearing. Does not speak [place]. Diabetes. Hypertension. EXAM: CT CHEST, ABDOMEN AND PELVIS WITHOUT CONTRAST TECHNIQUE: Multidetector CT imaging of the chest, abdomen and pelvis was performed following the standard protocol without IV contrast. COMPARISON:  Plain films of 07/19/2013. Abdominopelvic CT of 03/31/2012. FINDINGS: Multifactorial degradation, including mild motion, lack of IV contrast, overlying EKG wires and leads. CT CHEST FINDINGS Cardiovascular: Aortic and branch vessel atherosclerosis. Tortuous thoracic aorta. Mild cardiomegaly with multivessel coronary artery atherosclerosis. Pulmonary artery enlargement, 3.1 cm  outflow tract. Mediastinum/Nodes: No mediastinal or definite hilar adenopathy, given limitations of unenhanced CT. Lungs/Pleura: No pleural fluid. Posterior right middle lobe volume loss and mild bronchiectasis are likely due to post infectious or inflammatory scarring. Similar findings within the anterior right lower lobe. Mild centrilobular emphysema. No pneumothorax. Probable scarring in the left apex. Somewhat more nodular component measures 5 mm on image 27/series 4. Posterior left upper lobe scarring. Musculoskeletal: Low-density well-circumscribed lesion in the lateral right breast measures 2.4 cm on image 39/series 3. No axillary adenopathy. Severe T12 compression deformity with mild ventral canal encroachment. This is of indeterminate acuity. CT ABDOMEN PELVIS FINDINGS Hepatobiliary: Normal liver. Normal gallbladder, without biliary ductal dilatation. Pancreas: Pancreatic atrophy with grossly similar multi cystic appearance involving the pancreatic tail and upstream body on image 54/series 3. No acute pancreatitis. Spleen: Normal in size, without focal abnormality. Adrenals/Urinary Tract: Normal adrenal glands. No renal calculi or hydronephrosis. Small right-sided bladder diverticulum. Stomach/Bowel: Normal stomach, without wall thickening. Normal colon, appendix, and terminal ileum. A periampullary duodenal diverticulum. Otherwise normal small bowel, without free intraperitoneal air. Vascular/Lymphatic: Advanced aortic and branch vessel atherosclerosis. No retroperitoneal or retrocrural adenopathy. Low-density portal caval lesion measures 2.5 cm and is similar back to 2013. No abdominopelvic adenopathy. Reproductive: Retroverted uterus.  No adnexal mass. Other: No significant free fluid.  Moderate pelvic floor laxity. Musculoskeletal: Osteopenia. Remote left pelvic fractures. S-shaped thoracolumbar spine curvature. Moderate L1 compression deformity is at least partially felt to Shey nonacute, given  sclerosis. Mild ventral canal encroachment. IMPRESSION: 1. Multifactorial degradation, as detailed above. 2. T12 and L1 compression deformities which are of indeterminate acuity. The L1 compression deformity is at least partially felt to Vanesha chronic, given sclerosis. 3. No other posttraumatic deformity identified. 4. Right breast low-density lesion is felt unlikely to Jazira posttraumatic. Consider nonemergent mammogram and likely diagnostic ultrasound. 5.  Coronary artery atherosclerosis. Aortic atherosclerosis. 6. 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 7. Pelvic floor laxity. 8. Tiny right sided bladder diverticulum. 9. Centrilobular emphysema with left apical scarring. A somewhat more nodular component should Teela considered for follow-up chest CT at 1 year, given consensus criteria. This recommendation follows the consensus statement: Guidelines for Management of Small Pulmonary Nodules Detected on CT Images: From the Fleischner Society 2017; Radiology 2017; 284:228-243. 10. Pulmonary artery enlargement suggests pulmonary arterial hypertension. Electronically Signed   By: Jeronimo Greaves M.D.   On: 10/09/2016 17:37   Dg Elbow Complete Right  Result Date: 10/09/2016 CLINICAL DATA:  Larey Seat out of bed today,  landed on right side, right elbow pain EXAM: RIGHT ELBOW - COMPLETE 3+ VIEW COMPARISON:  None. FINDINGS: Four views of the right elbow submitted. No acute fracture or subluxation. Minimal spurring of radial head. Mild spurring of olecranon process. No posterior fat pad sign. No radiopaque foreign body. IMPRESSION: No acute fracture or subluxation.  Minimal degenerative changes. Electronically Signed   By: Natasha Mead M.D.   On: 10/09/2016 14:49   Ct Head Wo Contrast  Result Date: 10/09/2016 CLINICAL DATA:  Fall, unwitnessed, headache, back pain EXAM: CT HEAD WITHOUT CONTRAST CT CERVICAL SPINE WITHOUT CONTRAST TECHNIQUE: Multidetector CT imaging of the head and cervical spine was performed following the  standard protocol without intravenous contrast. Multiplanar CT image reconstructions of the cervical spine were also generated. COMPARISON:  06/21/2013 head and cervical spine CT FINDINGS: CT HEAD FINDINGS Brain: Stable bilateral basal ganglia lacunes. No evidence of parenchymal hemorrhage or extra-axial fluid collection. No mass lesion, mass effect, or midline shift. No CT evidence of acute infarction. Nonspecific marked subcortical and periventricular white matter hypodensity, most in keeping with chronic small vessel ischemic change. Generalized cerebral volume loss. Cerebral ventricle sizes are stable and concordant with the degree of cerebral volume loss. Vascular: Intracranial atherosclerosis.  No acute abnormality. Skull: No evidence of calvarial fracture. Worsened diffuse osteopenia. Sinuses/Orbits: The visualized paranasal sinuses are essentially clear. Other: Stable postsurgical changes from right wall down mastoidectomy. Stable bilateral mastoid effusions. CT CERVICAL SPINE FINDINGS Alignment: Straightening of the cervical spine. No subluxation. Dens is well positioned between the lateral masses of C1. Skull base and vertebrae: No acute fracture. No primary bone lesion or focal pathologic process. Soft tissues and spinal canal: No prevertebral fluid or swelling. No visible canal hematoma. Disc levels: Moderate multilevel degenerative disc disease throughout the cervical spine, most prominent C5-6. Moderate bilateral facet arthropathy. Mild-to-moderate degenerative foraminal stenosis bilaterally at C4-5 and C5-6. Upper chest: Negative. Other: Status post right wall down mastoidectomy. Bilateral mastoid effusions. No discrete thyroid nodules. No pathologically enlarged cervical nodes. IMPRESSION: 1. No evidence of acute intracranial abnormality. No evidence of calvarial fracture. 2. Generalized cerebral volume loss and marked chronic small vessel ischemia . 3. No cervical spine fracture or subluxation. 4.  Moderate degenerative changes in the cervical spine as detailed . 5. Chronic bilateral mastoid effusions with history of right wall down mastoidectomy. Electronically Signed   By: Delbert Phenix M.D.   On: 10/09/2016 17:24   Ct Chest Wo Contrast  Result Date: 10/09/2016 CLINICAL DATA:  Unwitnessed fall. Low back pain. Hard of hearing. Does not speak [place]. Diabetes. Hypertension. EXAM: CT CHEST, ABDOMEN AND PELVIS WITHOUT CONTRAST TECHNIQUE: Multidetector CT imaging of the chest, abdomen and pelvis was performed following the standard protocol without IV contrast. COMPARISON:  Plain films of 07/19/2013. Abdominopelvic CT of 03/31/2012. FINDINGS: Multifactorial degradation, including mild motion, lack of IV contrast, overlying EKG wires and leads. CT CHEST FINDINGS Cardiovascular: Aortic and branch vessel atherosclerosis. Tortuous thoracic aorta. Mild cardiomegaly with multivessel coronary artery atherosclerosis. Pulmonary artery enlargement, 3.1 cm outflow tract. Mediastinum/Nodes: No mediastinal or definite hilar adenopathy, given limitations of unenhanced CT. Lungs/Pleura: No pleural fluid. Posterior right middle lobe volume loss and mild bronchiectasis are likely due to post infectious or inflammatory scarring. Similar findings within the anterior right lower lobe. Mild centrilobular emphysema. No pneumothorax. Probable scarring in the left apex. Somewhat more nodular component measures 5 mm on image 27/series 4. Posterior left upper lobe scarring. Musculoskeletal: Low-density well-circumscribed lesion in the lateral right breast measures 2.4  cm on image 39/series 3. No axillary adenopathy. Severe T12 compression deformity with mild ventral canal encroachment. This is of indeterminate acuity. CT ABDOMEN PELVIS FINDINGS Hepatobiliary: Normal liver. Normal gallbladder, without biliary ductal dilatation. Pancreas: Pancreatic atrophy with grossly similar multi cystic appearance involving the pancreatic tail and  upstream body on image 54/series 3. No acute pancreatitis. Spleen: Normal in size, without focal abnormality. Adrenals/Urinary Tract: Normal adrenal glands. No renal calculi or hydronephrosis. Small right-sided bladder diverticulum. Stomach/Bowel: Normal stomach, without wall thickening. Normal colon, appendix, and terminal ileum. A periampullary duodenal diverticulum. Otherwise normal small bowel, without free intraperitoneal air. Vascular/Lymphatic: Advanced aortic and branch vessel atherosclerosis. No retroperitoneal or retrocrural adenopathy. Low-density portal caval lesion measures 2.5 cm and is similar back to 2013. No abdominopelvic adenopathy. Reproductive: Retroverted uterus.  No adnexal mass. Other: No significant free fluid.  Moderate pelvic floor laxity. Musculoskeletal: Osteopenia. Remote left pelvic fractures. S-shaped thoracolumbar spine curvature. Moderate L1 compression deformity is at least partially felt to Rylann nonacute, given sclerosis. Mild ventral canal encroachment. IMPRESSION: 1. Multifactorial degradation, as detailed above. 2. T12 and L1 compression deformities which are of indeterminate acuity. The L1 compression deformity is at least partially felt to Sharlotte chronic, given sclerosis. 3. No other posttraumatic deformity identified. 4. Right breast low-density lesion is felt unlikely to Lonni posttraumatic. Consider nonemergent mammogram and likely diagnostic ultrasound. 5.  Coronary artery atherosclerosis. Aortic atherosclerosis. 6. 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 7. Pelvic floor laxity. 8. Tiny right sided bladder diverticulum. 9. Centrilobular emphysema with left apical scarring. A somewhat more nodular component should Jarely considered for follow-up chest CT at 1 year, given consensus criteria. This recommendation follows the consensus statement: Guidelines for Management of Small Pulmonary Nodules Detected on CT Images: From the Fleischner Society 2017; Radiology 2017; 284:228-243.  10. Pulmonary artery enlargement suggests pulmonary arterial hypertension. Electronically Signed   By: Jeronimo Greaves M.D.   On: 10/09/2016 17:37   Ct Cervical Spine Wo Contrast  Result Date: 10/09/2016 CLINICAL DATA:  Fall, unwitnessed, headache, back pain EXAM: CT HEAD WITHOUT CONTRAST CT CERVICAL SPINE WITHOUT CONTRAST TECHNIQUE: Multidetector CT imaging of the head and cervical spine was performed following the standard protocol without intravenous contrast. Multiplanar CT image reconstructions of the cervical spine were also generated. COMPARISON:  06/21/2013 head and cervical spine CT FINDINGS: CT HEAD FINDINGS Brain: Stable bilateral basal ganglia lacunes. No evidence of parenchymal hemorrhage or extra-axial fluid collection. No mass lesion, mass effect, or midline shift. No CT evidence of acute infarction. Nonspecific marked subcortical and periventricular white matter hypodensity, most in keeping with chronic small vessel ischemic change. Generalized cerebral volume loss. Cerebral ventricle sizes are stable and concordant with the degree of cerebral volume loss. Vascular: Intracranial atherosclerosis.  No acute abnormality. Skull: No evidence of calvarial fracture. Worsened diffuse osteopenia. Sinuses/Orbits: The visualized paranasal sinuses are essentially clear. Other: Stable postsurgical changes from right wall down mastoidectomy. Stable bilateral mastoid effusions. CT CERVICAL SPINE FINDINGS Alignment: Straightening of the cervical spine. No subluxation. Dens is well positioned between the lateral masses of C1. Skull base and vertebrae: No acute fracture. No primary bone lesion or focal pathologic process. Soft tissues and spinal canal: No prevertebral fluid or swelling. No visible canal hematoma. Disc levels: Moderate multilevel degenerative disc disease throughout the cervical spine, most prominent C5-6. Moderate bilateral facet arthropathy. Mild-to-moderate degenerative foraminal stenosis  bilaterally at C4-5 and C5-6. Upper chest: Negative. Other: Status post right wall down mastoidectomy. Bilateral mastoid effusions. No discrete thyroid nodules. No pathologically enlarged  cervical nodes. IMPRESSION: 1. No evidence of acute intracranial abnormality. No evidence of calvarial fracture. 2. Generalized cerebral volume loss and marked chronic small vessel ischemia . 3. No cervical spine fracture or subluxation. 4. Moderate degenerative changes in the cervical spine as detailed . 5. Chronic bilateral mastoid effusions with history of right wall down mastoidectomy. Electronically Signed   By: Delbert Phenix M.D.   On: 10/09/2016 17:24   Dg Knee Complete 4 Views Right  Result Date: 10/09/2016 CLINICAL DATA:  Larey Seat today, fall out of a bed and landed on right side; pain in right elbow and knee, hematoma on right anterior knee; EXAM: RIGHT KNEE - COMPLETE 4+ VIEW COMPARISON:  None. FINDINGS: No fracture of the proximal tibia or distal femur. Patella is normal. No joint effusion. IMPRESSION: No fracture or dislocation. Electronically Signed   By: Genevive Bi M.D.   On: 10/09/2016 14:41    Procedures Procedures (including critical care time)  Medications Ordered in ED Medications  morphine 4 MG/ML injection 2 mg (2 mg Intravenous Given 10/09/16 1431)  oxyCODONE-acetaminophen (PERCOCET/ROXICET) 5-325 MG per tablet 1 tablet (1 tablet Oral Given 10/09/16 1932)     Initial Impression / Assessment and Plan / ED Course  I have reviewed the triage vital signs and the nursing notes.  Pertinent labs & imaging results that were available during my care of the patient were reviewed by me and considered in my medical decision making (see chart for details).     Patient here for evaluation of injuries after rolling out of bed. She has multiple contusions on examination with no evidence of acute fractures. Given the ecchymosis to her chest wall CT was obtained that was negative for acute fracture or  acute process. IV contrast was not administered given the patient's difficulty in communicating and is unclear she's had a reaction in the past. She is nonambulatory at baseline. Plan to DC back to [facility] with outpatient follow-up and return precautions.  CT does demonstrate multiple incidental findings - there were age indeterminate compression fractures - thought to Yaslene old as patient has no midline spine tenderness.    Final Clinical Impressions(s) / ED Diagnoses   Final diagnoses:  Fall, initial encounter  Contusion of right knee, initial encounter  Contusion of chest wall, unspecified laterality, initial encounter    New Prescriptions Discharge Medication List as of 10/09/2016  7:53 PM       Tilden Fossa, MD 10/10/16 1610    Tilden Fossa, MD 10/10/16 [phone]

## 2016-10-09 NOTE — ED Triage Notes (Addendum)
PT RECEIVED FROM [facility] FOR AN UNWITNESSED FALL. PT STS SHE ROLLED OUT OF BED AND LAND ON HER TRASH CAN AND THEN ONTO THE FLOOR. PT IS C/O HEAD PAIN, LOWER BACK PAIN, AND RIGHT KNEE PAIN. PT HAS MULTIPLE BRUISES ALL OVER.

## 2016-10-09 NOTE — ED Notes (Signed)
Nurse from [facility] called, wanted update on patient's status. Please call back when disposition is set, whether admit or discharge. [phone]

## 2016-10-09 NOTE — ED Notes (Signed)
FAMILY MADE AWARE OF PT BEING DISCHARGED.

## 2016-10-09 NOTE — ED Notes (Signed)
Bed: WA04 Expected date:  Expected time:  Means of arrival:  Comments: EMS Fall 

## 2016-10-09 NOTE — ED Notes (Signed)
PT DISCHARGED. INSTRUCTIONS GIVEN TO [facility] STAFF. AAOX4. PT IN NO APPARENT DISTRESS OR PAIN. THE OPPORTUNITY TO ASK QUESTIONS WAS PROVIDED. 

## 2016-10-10 ENCOUNTER — Encounter: Payer: Self-pay | Admitting: Internal Medicine

## 2016-10-10 NOTE — Progress Notes (Signed)
Opened in error; Disregard.

## 2016-10-11 ENCOUNTER — Telehealth: Payer: Self-pay

## 2016-10-11 NOTE — Telephone Encounter (Signed)
This is a patient of [facility], who was admitted to [facility] after hospitalization. [facility] - Hospital F/U is needed. Hospital discharge from [facility] on 10/09/2016.

## 2016-10-13 ENCOUNTER — Non-Acute Institutional Stay (SKILLED_NURSING_FACILITY): Payer: Medicare Other | Admitting: Internal Medicine

## 2016-10-13 ENCOUNTER — Encounter: Payer: Self-pay | Admitting: Internal Medicine

## 2016-10-13 DIAGNOSIS — R911 Solitary pulmonary nodule: Secondary | ICD-10-CM

## 2016-10-13 DIAGNOSIS — N6310 Unspecified lump in the right breast, unspecified quadrant: Secondary | ICD-10-CM | POA: Diagnosis not present

## 2016-10-13 DIAGNOSIS — R062 Wheezing: Secondary | ICD-10-CM

## 2016-10-13 DIAGNOSIS — IMO0001 Reserved for inherently not codable concepts without codable children: Secondary | ICD-10-CM

## 2016-10-13 NOTE — Progress Notes (Signed)
Location:  [facility] Room Number: 205D Place of Service:  SNF (31)  [name], MD  [name], MD as PCP - General (Internal Medicine)  Extended Emergency Contact Information Primary Emergency Contact: Hancher,[name] Address: [address]          [place], [place] [address] [name] of [place] Home Phone: [phone] Mobile Phone: [phone] Relation: Sister Secondary Emergency Contact: [name],[name] Address: [address]          [place], [place] [address] [name] of [place] Home Phone: [phone] Relation: Grandaughter    Allergies: Tequin [gatifloxacin]  Chief Complaint  Patient presents with  . Acute Visit    Acute    HPI: Patient is 83 y.o. female who is being seen for wheezing noted today for first time. No cold or cough. No fever.Pt fell on 4/15 and sustained a chest contusion. PNA would Sophiamarie a concern.  Past Medical History:  Diagnosis Date  . Chronic diastolic heart failure (HCC)   . Diabetes mellitus   . Diabetes mellitus without complication (HCC)   . Gout   . Hypertension     Past Surgical History:  Procedure Laterality Date  . NO PAST SURGERIES      Allergies as of 10/13/2016      Reactions   Tequin [gatifloxacin]       Medication List       Accurate as of 10/13/16 11:59 PM. Always use your most recent med list.          acetaminophen 325 MG tablet Commonly known as:  TYLENOL Take 650 mg by mouth every 8 (eight) hours as needed.   acetaminophen 500 MG tablet Commonly known as:  TYLENOL Take 500 mg by mouth 3 (three) times daily.   artificial tears Oint ophthalmic ointment Place 1 application into both eyes 4 (four) times daily.   aspirin 81 MG chewable tablet Chew 1 tablet (81 mg total) by mouth daily.   ferrous sulfate 325 (65 FE) MG tablet Take 325 mg by mouth daily with breakfast.   furosemide 40 MG tablet Commonly known as:  LASIX Take 40 mg  by mouth every morning.   gabapentin 300 MG capsule Commonly known as:  NEURONTIN Take 1 capsule (300 mg total) by mouth at bedtime.   ipratropium-albuterol 0.5-2.5 (3) MG/3ML Soln Commonly known as:  DUONEB Take 3 mLs by nebulization every 6 (six) hours as needed.   lidocaine 5 % Commonly known as:  LIDODERM Place 1 patch onto the skin daily. Apply to right knee and lumbar Remove & Discard patch within 12 hours or as directed by MD   losartan 25 MG tablet Commonly known as:  COZAAR Take 25 mg by mouth every morning. For HTN   metFORMIN 500 MG 24 hr tablet Commonly known as:  GLUCOPHAGE-XR Take 500 mg by mouth every evening. For diabetes   omeprazole 20 MG capsule Commonly known as:  PRILOSEC Take 20 mg by mouth daily. For GERD   potassium chloride 10 MEQ tablet Commonly known as:  K-DUR Take 30 mEq by mouth daily. For potassium   traMADol 50 MG tablet Commonly known as:  ULTRAM Take 50 mg by mouth every 6 (six) hours as needed.   traMADol-acetaminophen 37.5-325 MG tablet Commonly known as:  ULTRACET Take 2 tablets by mouth every 6 (six) hours as needed for severe pain.   Vitamin D (Ergocalciferol) 50000 units Caps capsule Commonly known as:  DRISDOL Take 50,000 Units by mouth every 7 (seven) days. On saturdays       No orders of the defined types were placed in this encounter.   Immunization History  Administered Date(s) Administered  . Influenza-Unspecified 03/23/2015, 04/06/2016  . PPD Test 08/12/2013  . Pneumococcal Polysaccharide-23 06/23/2013    Social History  Substance Use Topics  . Smoking status: Former Games developer  . Smokeless tobacco: Never Used  . Alcohol use No    Review of Systems  DATA OBTAINED: from patient, nurse GENERAL:  no fevers, fatigue, appetite changes SKIN: No itching, rash HEENT: No complaint RESPIRATORY: No cough, wheezing, SOB CARDIAC: No chest pain, palpitations, lower extremity edema  GI: No abdominal pain, No N/V/D or  constipation, No heartburn or reflux  GU: No dysuria, frequency or urgency, or incontinence  MUSCULOSKELETAL: No unrelieved bone/joint pain NEUROLOGIC: No headache, dizziness  PSYCHIATRIC: No overt anxiety or sadness  Vitals:   10/13/16 1139  BP: 110/74  Pulse: 73  Resp: 16  Temp: 97.3 F (36.3 C)   Body mass index is 20.08 kg/m. Physical Exam  GENERAL APPEARANCE: Alert,  No acute distress  SKIN: No diaphoresis rash HEENT: Unremarkable RESPIRATORY: Breathing is even, unlabored. Lung sounds are wheezing, rales at R base ; contusion R chest wall, resolving, minimal TTP;pt is not splinting with breathing  CARDIOVASCULAR: Heart RRR no murmurs, rubs or gallops. No peripheral edema  GASTROINTESTINAL: Abdomen is soft, non-tender, not distended w/ normal bowel sounds.  GENITOURINARY: Bladder non tender, not distended  MUSCULOSKELETAL: No abnormal joints or musculature NEUROLOGIC: Cranial nerves 2-12 grossly intact. Moves all extremities PSYCHIATRIC: Mood and affect appropriate to situation, no behavioral issues  Patient Active Problem List   Diagnosis Date Noted  . Vitamin D deficiency 04/11/2016  . Hyperlipidemia 02/11/2016  . Pain 11/07/2015  . Wheezing 11/07/2015  . Edema 10/25/2015  . Conjunctivitis 07/09/2015  . UTI (urinary tract infection) 03/24/2015  . GERD (gastroesophageal reflux disease) 12/17/2014  . Hypokalemia 12/17/2014  . Depression 11/25/2014  . Iron deficiency anemia 09/03/2014  . Chronic renal disease, stage 2, mildly decreased glomerular filtration rate (GFR) between 60-89 mL/min/1.73 square meter 09/03/2014  . Pain in joint, lower leg 08/21/2014  . Cough 01/27/2014  . Dizziness 01/12/2014  . Contusion of unspecified site 01/12/2014  . Lump in female breast 12/13/2013  . Lumbago 10/23/2013  . Type 2 diabetes, controlled, with neuropathy (HCC) 10/18/2013  . 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 Unspecified  constipation 10/18/2013  . Plantar fasciitis, left 07/29/2013  . Physical deconditioning 07/21/2013  . Diabetes mellitus, controlled (HCC) 07/19/2013  . Chronic diastolic CHF (congestive heart failure) (HCC) 07/19/2013  . HCAP (healthcare-associated pneumonia) 07/18/2013  . Chronic diastolic heart failure (HCC) 06/24/2013  . Syncope 06/21/2013  . Syncope and collapse 06/21/2013  . Leukocytosis 06/21/2013  . Hypertensive heart disease with congestive heart failure (HCC) 06/21/2013  . Diabetes mellitus due to underlying condition (HCC) 06/21/2013    CMP     Component Value Date/Time   NA 141 10/09/2016 1425   NA 141 10/09/2016   K 4.6 10/09/2016 1425   CL 106 10/09/2016 1425   CO2 26 10/09/2016 1425   GLUCOSE 97 10/09/2016 1425   BUN 13 10/09/2016 1425   BUN 13 10/09/2016   CREATININE 0.82 10/09/2016 1425   CALCIUM 8.8 (L) 10/09/2016 1425   PROT 6.4 (L) 10/09/2016 1425   ALBUMIN 3.4 (L) 10/09/2016 1425   AST 19 10/09/2016 1425  ALT 12 (L) 10/09/2016 1425   ALKPHOS 73 10/09/2016 1425   BILITOT 0.5 10/09/2016 1425   GFRNONAA >60 10/09/2016 1425   GFRAA >60 10/09/2016 1425    Recent Labs  03/30/16 10/09/16 10/09/16 1425  NA 142 141 141  K 4.2  --  4.6  CL  --   --  106  CO2  --   --  26  GLUCOSE  --   --  97  BUN CREATININE 0.8 0.8 0.82  CALCIUM  --   --  8.8*    Recent Labs  03/25/16 03/30/16 10/09/16 1425  AST ALT 11 13 12*  ALKPHOS 76 74 73  BILITOT  --   --  0.5  PROT  --   --  6.4*  ALBUMIN  --   --  3.4*    Recent Labs  03/25/16 10/09/16 10/09/16 1425  WBC 5.2 6.6 6.6  NEUTROABS  --   --  5.0  HGB 12.4  --  11.7*  HCT 37  --  36.1  MCV  --   --  91.2  PLT 177  --  156    Recent Labs  05/17/16  CHOL 205*  LDLCALC 131  TRIG 119   No results found for: [facility] Lab Results  Component Value Date   TSH 0.41 03/25/2016   Lab Results  Component Value Date   HGBA1C 5.6 03/25/2016   Lab Results  Component Value  Date   CHOL 205 (A) 05/17/2016   HDL 51 05/17/2016   LDLCALC 131 05/17/2016   TRIG 119 05/17/2016   CHOLHDL 3.2 06/22/2013    Significant Diagnostic Results in last 30 days:  Dg Op Swallowing Func-medicare/speech Path  Result Date: 10/28/2016 Objective Swallowing Evaluation: Type of Study: MBS-Modified Barium Swallow Study Patient Details Name: Aashna Matson MRN: [id number] Date of Birth: 1931/12/29 Today's Date: 10/28/2016 Time: SLP Start Time (ACUTE ONLY): 1300-SLP Stop Time (ACUTE ONLY): 1345 SLP Time Calculation (min) (ACUTE ONLY): 45 min Past Medical History: No past medical history on file. Past Surgical History: No past surgical history on file. HPI: 83 yo female resident of SNF - Pt referred for MBS due to pt having wheeze after eating.  Pt PMH + for wheelchair bound, muscle weakness, GERD without esophagitis, Vit D deficiency, gout, pna, weakness, DM, major depressive d/o.  Pt does not speak English and her granddaughter Raynelle Fanning was present for MBS and desired to interpret for pt.   Subjective: pt awake in chair Assessment / Plan / Recommendation CHL IP CLINICAL IMPRESSIONS 10/28/2016 Clinical Impression Minimal oropharyngeal dysphagia.  NO dentures present and pt with very poor mastication attempts with moist, soft solid.  Pharyngeal swallow was strong without severe residuals.  Pt did have trace laryngeal penetration of thin that cleared with same swallowing due to timing of laryngeal closure.  Suspect pt's lack of dentition contribute significantly to her dysphagia symptoms, as she has inadequate mastication.  Granddaughter reports she will try to get pt dentures that fit appropriately.  Barium tablet did not transit into throat with first liquid swallow attempt, then spilled to vallecular space and cleared throat with further swallows.  Of note, pt's primary swallow complaints was symptoms of food lodging in throat - but no severe residuals in pharynx noted and testing did not reproduce symptoms.    SLP  Visit Diagnosis Dysphagia, oropharyngeal phase (R13.12) Attention and concentration deficit following -- Frontal lobe and executive function deficit following --  Impact on safety and function Severe aspiration risk;Risk for inadequate nutrition/hydration   No flowsheet data found.  No flowsheet data found. CHL IP DIET RECOMMENDATION 10/28/2016 SLP Diet Recommendations Dysphagia 3 (Mech soft) solids;Thin liquid Liquid Administration via Cup;Straw Medication Administration Whole meds with puree Compensations Minimize environmental distractions;Slow rate;Small sips/bites; Follow solids with liquids Postural Changes Remain semi-upright after after feeds/meals (Comment);Seated upright at 90 degrees   CHL IP OTHER RECOMMENDATIONS 10/28/2016 Recommended Consults -- Oral Care Recommendations Oral care BID Other Recommendations --   No flowsheet data found.  No flowsheet data found.     CHL IP ORAL PHASE 10/28/2016 Oral Phase Impaired Oral - Pudding Teaspoon -- Oral - Pudding Cup -- Oral - Honey Teaspoon -- Oral - Honey Cup -- Oral - Nectar Teaspoon -- Oral - Nectar Cup WFL Oral - Nectar Straw -- Oral - Thin Teaspoon -- Oral - Thin Cup WFL Oral - Thin Straw WFL Oral - Puree WFL Oral - Mech Soft Impaired mastication;Delayed oral transit Oral - Regular -- Oral - Multi-Consistency -- Oral - Pill Weak lingual manipulation;Piecemeal swallowing;Premature spillage Oral Phase - Comment --  CHL IP PHARYNGEAL PHASE 10/28/2016 Pharyngeal Phase Impaired Pharyngeal- Pudding Teaspoon -- Pharyngeal -- Pharyngeal- Pudding Cup -- Pharyngeal -- Pharyngeal- Honey Teaspoon -- Pharyngeal -- Pharyngeal- Honey Cup -- Pharyngeal -- Pharyngeal- Nectar Teaspoon -- Pharyngeal -- Pharyngeal- Nectar Cup WFL Pharyngeal -- Pharyngeal- Nectar Straw -- Pharyngeal -- Pharyngeal- Thin Teaspoon -- Pharyngeal -- Pharyngeal- Thin Cup WFL;Penetration/Aspiration during swallow Pharyngeal Material enters airway, remains ABOVE vocal cords then ejected out Pharyngeal- Thin  Straw WFL;Penetration/Aspiration during swallow Pharyngeal Material enters airway, remains ABOVE vocal cords then ejected out Pharyngeal- Puree WFL Pharyngeal -- Pharyngeal- Mechanical Soft WFL Pharyngeal -- Pharyngeal- Regular -- Pharyngeal -- Pharyngeal- Multi-consistency -- Pharyngeal -- Pharyngeal- Pill Delayed swallow initiation-vallecula Pharyngeal -- Pharyngeal Comment barium tablet spilled into pharynx lodging at vallecular space, cleared with furhter boluses of thin  CHL IP CERVICAL ESOPHAGEAL PHASE 10/28/2016 Cervical Esophageal Phase WFL Pudding Teaspoon -- Pudding Cup -- Honey Teaspoon -- Honey Cup -- Nectar Teaspoon -- Nectar Cup -- Nectar Straw -- Thin Teaspoon -- Thin Cup -- Thin Straw -- Puree -- Mechanical Soft -- Regular -- Multi-consistency -- Pill -- Cervical Esophageal Comment -- CHL IP GO 10/28/2016 Functional Assessment Tool Used MBS Functional Limitations Swallowing Swallow Current Status (Z6109) CJ Swallow Goal Status (U0454) CJ Swallow Discharge Status [phone]) CJ Mills Koller, MS [facility] SLP [phone]            CLINICAL DATA:  Dysphagia. EXAM: MODIFIED BARIUM SWALLOW TECHNIQUE: Different consistencies of barium were administered orally to the patient by the Speech Pathologist. Imaging of the pharynx was performed in the lateral projection. FLUOROSCOPY TIME:  Fluoroscopy Time:  1 minutes and 12 seconds. COMPARISON:  None. FINDINGS: Thin liquid- flash laryngeal penetration. Nectar thick liquid- within normal limits Honey- within normal limits Pure- within normal limits Cracker-within normal limits Pure with cracker- within normal limits Barium tablet -  within normal limits IMPRESSION: Flash laryngeal penetration with thin liquids. No evidence of frank aspiration. Please refer to the Speech Pathologists report for complete details and recommendations. Electronically Signed   By: Ted Mcalpine M.D.   On: 10/28/2016 13:48    Assessment and Plan  WHEEZING - new onset;  have ordered scheduled nebs TID for a week and mucinex 600 mg q12 for a week; CXR has been ordered.  R BREAST NODULE- noted on CT needs f/u; set up mammogram/ bx  LUNG NODULE NOTED ON CT AT [facility]- set up 1 year f/u CT     Time spent > 19min;> 50% of time with patient was spent reviewing records, labs, tests and studies, counseling and developing plan of care  Thurston Hole D. Lyn Hollingshead, MD

## 2016-10-17 DIAGNOSIS — R262 Difficulty in walking, not elsewhere classified: Secondary | ICD-10-CM | POA: Diagnosis not present

## 2016-10-17 DIAGNOSIS — F329 Major depressive disorder, single episode, unspecified: Secondary | ICD-10-CM | POA: Diagnosis not present

## 2016-10-17 DIAGNOSIS — I5032 Chronic diastolic (congestive) heart failure: Secondary | ICD-10-CM | POA: Diagnosis not present

## 2016-10-17 DIAGNOSIS — R488 Other symbolic dysfunctions: Secondary | ICD-10-CM | POA: Diagnosis not present

## 2016-10-17 DIAGNOSIS — F419 Anxiety disorder, unspecified: Secondary | ICD-10-CM | POA: Diagnosis not present

## 2016-10-18 DIAGNOSIS — I5032 Chronic diastolic (congestive) heart failure: Secondary | ICD-10-CM | POA: Diagnosis not present

## 2016-10-18 DIAGNOSIS — R488 Other symbolic dysfunctions: Secondary | ICD-10-CM | POA: Diagnosis not present

## 2016-10-18 DIAGNOSIS — R262 Difficulty in walking, not elsewhere classified: Secondary | ICD-10-CM | POA: Diagnosis not present

## 2016-10-20 ENCOUNTER — Other Ambulatory Visit (HOSPITAL_COMMUNITY): Payer: Self-pay | Admitting: Internal Medicine

## 2016-10-20 DIAGNOSIS — R1319 Other dysphagia: Secondary | ICD-10-CM

## 2016-10-28 ENCOUNTER — Ambulatory Visit (HOSPITAL_COMMUNITY)
Admission: RE | Admit: 2016-10-28 | Discharge: 2016-10-28 | Disposition: A | Discharge status: Home / Self Care | Payer: Medicare Other | Source: Ambulatory Visit | Attending: Internal Medicine | Admitting: Internal Medicine

## 2016-10-28 ENCOUNTER — Encounter: Payer: Self-pay | Admitting: Internal Medicine

## 2016-10-28 DIAGNOSIS — R1319 Other dysphagia: Secondary | ICD-10-CM

## 2016-10-28 DIAGNOSIS — R1312 Dysphagia, oropharyngeal phase: Secondary | ICD-10-CM | POA: Diagnosis not present

## 2016-11-09 DIAGNOSIS — F419 Anxiety disorder, unspecified: Secondary | ICD-10-CM | POA: Diagnosis not present

## 2016-11-09 DIAGNOSIS — F331 Major depressive disorder, recurrent, moderate: Secondary | ICD-10-CM | POA: Diagnosis not present

## 2016-11-11 ENCOUNTER — Non-Acute Institutional Stay (SKILLED_NURSING_FACILITY): Payer: Medicare Other | Admitting: Internal Medicine

## 2016-11-11 ENCOUNTER — Encounter: Payer: Self-pay | Admitting: Internal Medicine

## 2016-11-11 DIAGNOSIS — I110 Hypertensive heart disease with heart failure: Secondary | ICD-10-CM | POA: Diagnosis not present

## 2016-11-11 DIAGNOSIS — E1142 Type 2 diabetes mellitus with diabetic polyneuropathy: Secondary | ICD-10-CM

## 2016-11-11 DIAGNOSIS — I5022 Chronic systolic (congestive) heart failure: Secondary | ICD-10-CM | POA: Diagnosis not present

## 2016-11-11 DIAGNOSIS — K219 Gastro-esophageal reflux disease without esophagitis: Secondary | ICD-10-CM | POA: Diagnosis not present

## 2016-11-11 NOTE — Progress Notes (Signed)
Location:  [facility] Room Number: 205D Place of Service:  SNF (31)  [name], Jenniah Bhavsar D, MD  Patient Care Team: [name], Katelinn Justice D, MD as PCP - General (Internal Medicine)  Extended Emergency Contact Information Primary Emergency Contact: Overbeck,[name] Address: [address]          [place], [place] [address] [place] Home Phone: [phone] Mobile Phone: [phone] Relation: Sister Secondary Emergency Contact: [name],[name] Address: [address]          [place], [place] [address] [place] Home Phone: [phone] Relation: Grandaughter    Allergies: Tequin [gatifloxacin]  Chief Complaint  Patient presents with  . Medical Management of Chronic Issues    Routine Visit    HPI: Patient is 83 y.o. female who is being seen for routine issues of HTN, GERD, and diabetic polyneuropathy.  Past Medical History:  Diagnosis Date  . Chronic diastolic heart failure (HCC)   . Diabetes mellitus   . Diabetes mellitus without complication (HCC)   . Gout   . Hypertension     Past Surgical History:  Procedure Laterality Date  . NO PAST SURGERIES      Allergies as of 11/11/2016      Reactions   Tequin [gatifloxacin]       Medication List       Accurate as of 11/11/16 11:59 PM. Always use your most recent med list.          acetaminophen 325 MG tablet Commonly known as:  TYLENOL Take 650 mg by mouth every 8 (eight) hours as needed.   acetaminophen 500 MG tablet Commonly known as:  TYLENOL Take 500 mg by mouth 3 (three) times daily.   artificial tears Oint ophthalmic ointment Place 1 application into both eyes 4 (four) times daily.   aspirin 81 MG chewable tablet Chew 1 tablet (81 mg total) by mouth daily.   ferrous sulfate 325 (65 FE) MG tablet Take 325 mg by mouth daily with breakfast.   furosemide 40 MG tablet Commonly known as:  LASIX Take 40 mg by mouth every morning.   gabapentin 300 MG  capsule Commonly known as:  NEURONTIN Take 1 capsule (300 mg total) by mouth at bedtime.   ipratropium-albuterol 0.5-2.5 (3) MG/3ML Soln Commonly known as:  DUONEB Take 3 mLs by nebulization every 6 (six) hours as needed.   lidocaine 5 % Commonly known as:  LIDODERM Place 1 patch onto the skin daily. Apply to right knee and lumbar Remove & Discard patch within 12 hours or as directed by MD   losartan 25 MG tablet Commonly known as:  COZAAR Take 25 mg by mouth every morning. For HTN   metFORMIN 500 MG 24 hr tablet Commonly known as:  GLUCOPHAGE-XR Take 500 mg by mouth every evening. For diabetes   omeprazole 20 MG capsule Commonly known as:  PRILOSEC Take 20 mg by mouth daily. For GERD   potassium chloride 10 MEQ tablet Commonly known as:  K-DUR Take 30 mEq by mouth daily. For potassium   traMADol 50 MG tablet Commonly known as:  ULTRAM Take 50 mg by mouth every 6 (six) hours as needed.   traMADol-acetaminophen 37.5-325 MG tablet Commonly known as:  ULTRACET Take 2 tablets by mouth every 6 (six) hours as needed for severe pain.   Vitamin D (Ergocalciferol) 50000 units Caps capsule Commonly known as:  DRISDOL Take 50,000 Units by mouth every 7 (seven) days. On saturdays  No orders of the defined types were placed in this encounter.   Immunization History  Administered Date(s) Administered  . Influenza-Unspecified 03/23/2015, 04/06/2016  . PPD Test 08/12/2013  . Pneumococcal Polysaccharide-23 06/23/2013    Social History  Substance Use Topics  . Smoking status: Former Games developer  . Smokeless tobacco: Never Used  . Alcohol use No    Review of Systems  DATA OBTAINED: from patient, nurse GENERAL:  no fevers, fatigue, appetite changes SKIN: No itching, rash HEENT: No complaint RESPIRATORY: No cough, wheezing, SOB CARDIAC: No chest pain, palpitations, lower extremity edema  GI: No abdominal pain, No N/V/D or constipation, No heartburn or reflux  GU: No  dysuria, frequency or urgency, or incontinence  MUSCULOSKELETAL: No unrelieved bone/joint pain NEUROLOGIC: No headache, dizziness  PSYCHIATRIC: No overt anxiety or sadness  Vitals:   11/11/16 0928  BP: 122/84  Pulse: 75  Resp: 15  Temp: 98.8 F (37.1 C)   Body mass index is 20.15 kg/m. Physical Exam  GENERAL APPEARANCE: Alert, min conversant, No acute distress  SKIN: No diaphoresis rash HEENT: Unremarkable RESPIRATORY: Breathing is even, unlabored. Lung sounds are clear   CARDIOVASCULAR: Heart RRR no murmurs, rubs or gallops. No peripheral edema  GASTROINTESTINAL: Abdomen is soft, non-tender, not distended w/ normal bowel sounds.  GENITOURINARY: Bladder non tender, not distended  MUSCULOSKELETAL: No abnormal joints or musculature NEUROLOGIC: Cranial nerves 2-12 grossly intact. Moves all extremities PSYCHIATRIC: Mood and affect appropriate to situation, no behavioral issues  Patient Active Problem List   Diagnosis Date Noted  . Lung nodule < 6cm on CT 11/13/2016  . Vitamin D deficiency 04/11/2016  . Hyperlipidemia 02/11/2016  . Pain 11/07/2015  . Wheezing 11/07/2015  . Edema 10/25/2015  . Conjunctivitis 07/09/2015  . UTI (urinary tract infection) 03/24/2015  . GERD (gastroesophageal reflux disease) 12/17/2014  . Hypokalemia 12/17/2014  . Depression 11/25/2014  . Iron deficiency anemia 09/03/2014  . Chronic renal disease, stage 2, mildly decreased glomerular filtration rate (GFR) between 60-89 mL/min/1.73 square meter 09/03/2014  . Pain in joint, lower leg 08/21/2014  . Cough 01/27/2014  . Dizziness 01/12/2014  . Contusion of unspecified site 01/12/2014  . Breast mass, right 12/13/2013  . Lumbago 10/23/2013  . Type 2 diabetes, controlled, with neuropathy (HCC) 10/18/2013  . 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 Unspecified constipation 10/18/2013  . Plantar fasciitis, left 07/29/2013  . Physical deconditioning 07/21/2013    . Diabetes mellitus, controlled (HCC) 07/19/2013  . Chronic diastolic CHF (congestive heart failure) (HCC) 07/19/2013  . HCAP (healthcare-associated pneumonia) 07/18/2013  . Chronic diastolic heart failure (HCC) 06/24/2013  . Syncope 06/21/2013  . Syncope and collapse 06/21/2013  . Leukocytosis 06/21/2013  . Hypertensive heart disease with congestive heart failure (HCC) 06/21/2013  . Diabetes mellitus due to underlying condition (HCC) 06/21/2013    CMP     Component Value Date/Time   NA 141 10/09/2016 1425   NA 141 10/09/2016   K 4.6 10/09/2016 1425   CL 106 10/09/2016 1425   CO2 26 10/09/2016 1425   GLUCOSE 97 10/09/2016 1425   BUN 13 10/09/2016 1425   BUN 13 10/09/2016   CREATININE 0.82 10/09/2016 1425   CALCIUM 8.8 (L) 10/09/2016 1425   PROT 6.4 (L) 10/09/2016 1425   ALBUMIN 3.4 (L) 10/09/2016 1425   AST 19 10/09/2016 1425   ALT 12 (L) 10/09/2016 1425   ALKPHOS 73 10/09/2016 1425   BILITOT 0.5 10/09/2016 1425   GFRNONAA >60 10/09/2016 1425  GFRAA >60 10/09/2016 1425    Recent Labs  03/30/16 10/09/16 10/09/16 1425  NA 142 141 141  K 4.2  --  4.6  CL  --   --  106  CO2  --   --  26  GLUCOSE  --   --  97  BUN 10 13 13   CREATININE 0.8 0.8 0.82  CALCIUM  --   --  8.8*    Recent Labs  03/25/16 03/30/16 10/09/16 1425  AST 26 21 19   ALT 11 13 12*  ALKPHOS 76 74 73  BILITOT  --   --  0.5  PROT  --   --  6.4*  ALBUMIN  --   --  3.4*    Recent Labs  03/25/16 10/09/16 10/09/16 1425  WBC 5.2 6.6 6.6  NEUTROABS  --   --  5.0  HGB 12.4  --  11.7*  HCT 37  --  36.1  MCV  --   --  91.2  PLT 177  --  156    Recent Labs  05/17/16  CHOL 205*  LDLCALC 131  TRIG 119   No results found for: [facility] Lab Results  Component Value Date   TSH 0.41 03/25/2016   Lab Results  Component Value Date   HGBA1C 5.6 03/25/2016   Lab Results  Component Value Date   CHOL 205 (A) 05/17/2016   HDL 51 05/17/2016   LDLCALC 131 05/17/2016   TRIG 119 05/17/2016    CHOLHDL 3.2 06/22/2013    Significant Diagnostic Results in last 30 days:  No results found.  Assessment and Plan  Hypertensive heart disease with congestive heart failure Controlled ; cont cozaar 25 mg daily and lasix 40 mg daily  GERD (gastroesophageal reflux disease) No c/o indigestion or reflux; plan to cont omeprazole 20 mg dailyu  Peripheral sensory neuropathy due to type 2 diabetes mellitus No signs of pain or reports of sx; plan to cont neurontin 300 mg qHS     Nasiir Monts D. Lyn Hollingshead, MD

## 2016-11-13 ENCOUNTER — Encounter: Payer: Self-pay | Admitting: Internal Medicine

## 2016-11-13 DIAGNOSIS — R911 Solitary pulmonary nodule: Secondary | ICD-10-CM

## 2016-11-13 DIAGNOSIS — IMO0001 Reserved for inherently not codable concepts without codable children: Secondary | ICD-10-CM | POA: Insufficient documentation

## 2016-11-13 HISTORY — DX: Reserved for inherently not codable concepts without codable children: IMO0001

## 2016-11-13 HISTORY — DX: Solitary pulmonary nodule: R91.1

## 2016-11-26 DIAGNOSIS — M6281 Muscle weakness (generalized): Secondary | ICD-10-CM | POA: Diagnosis not present

## 2016-11-26 DIAGNOSIS — M1990 Unspecified osteoarthritis, unspecified site: Secondary | ICD-10-CM | POA: Diagnosis not present

## 2016-12-02 DIAGNOSIS — E559 Vitamin D deficiency, unspecified: Secondary | ICD-10-CM | POA: Diagnosis not present

## 2016-12-02 DIAGNOSIS — M1990 Unspecified osteoarthritis, unspecified site: Secondary | ICD-10-CM | POA: Diagnosis not present

## 2016-12-02 DIAGNOSIS — R488 Other symbolic dysfunctions: Secondary | ICD-10-CM | POA: Diagnosis not present

## 2016-12-02 DIAGNOSIS — M6281 Muscle weakness (generalized): Secondary | ICD-10-CM | POA: Diagnosis not present

## 2016-12-05 DIAGNOSIS — M1990 Unspecified osteoarthritis, unspecified site: Secondary | ICD-10-CM | POA: Diagnosis not present

## 2016-12-05 DIAGNOSIS — M6281 Muscle weakness (generalized): Secondary | ICD-10-CM | POA: Diagnosis not present

## 2016-12-06 DIAGNOSIS — M1990 Unspecified osteoarthritis, unspecified site: Secondary | ICD-10-CM | POA: Diagnosis not present

## 2016-12-06 DIAGNOSIS — M6281 Muscle weakness (generalized): Secondary | ICD-10-CM | POA: Diagnosis not present

## 2016-12-07 DIAGNOSIS — M1990 Unspecified osteoarthritis, unspecified site: Secondary | ICD-10-CM | POA: Diagnosis not present

## 2016-12-07 DIAGNOSIS — M6281 Muscle weakness (generalized): Secondary | ICD-10-CM | POA: Diagnosis not present

## 2016-12-08 DIAGNOSIS — M1990 Unspecified osteoarthritis, unspecified site: Secondary | ICD-10-CM | POA: Diagnosis not present

## 2016-12-08 DIAGNOSIS — M6281 Muscle weakness (generalized): Secondary | ICD-10-CM | POA: Diagnosis not present

## 2016-12-09 ENCOUNTER — Non-Acute Institutional Stay (SKILLED_NURSING_FACILITY): Payer: Medicare Other

## 2016-12-09 DIAGNOSIS — Z0000 Encounter for general adult medical examination without abnormal findings: Secondary | ICD-10-CM

## 2016-12-09 DIAGNOSIS — M1990 Unspecified osteoarthritis, unspecified site: Secondary | ICD-10-CM | POA: Diagnosis not present

## 2016-12-09 DIAGNOSIS — M6281 Muscle weakness (generalized): Secondary | ICD-10-CM | POA: Diagnosis not present

## 2016-12-09 NOTE — Progress Notes (Signed)
Subjective:   Margaret [name] is a 83 y.o. female who presents for an Initial Medicare Annual Wellness Visit at [facility]       Objective:    Today's Vitals   12/09/16 1546  BP: 112/68  Pulse: 87  Temp: 98.1 F (36.7 C)  TempSrc: Oral  SpO2: 96%  Weight: 103 lb (46.7 kg)  Height: 5' (1.524 m)   Body mass index is 20.12 kg/m.   Current Medications (verified) Outpatient Encounter Prescriptions as of 12/09/2016  Medication Sig  . acetaminophen (TYLENOL) 325 MG tablet Take 650 mg by mouth every 8 (eight) hours as needed.  Marland Kitchen. acetaminophen (TYLENOL) 500 MG tablet Take 500 mg by mouth 3 (three) times daily.  Marland Kitchen. artificial tears (LACRILUBE) OINT ophthalmic ointment Place 1 application into both eyes 4 (four) times daily.  Marland Kitchen. aspirin 81 MG chewable tablet Chew 1 tablet (81 mg total) by mouth daily.  . ferrous sulfate 325 (65 FE) MG tablet Take 325 mg by mouth daily with breakfast.   . furosemide (LASIX) 40 MG tablet Take 40 mg by mouth every morning.   . gabapentin (NEURONTIN) 300 MG capsule Take 1 capsule (300 mg total) by mouth at bedtime.  Marland Kitchen. ipratropium-albuterol (DUONEB) 0.5-2.5 (3) MG/3ML SOLN Take 3 mLs by nebulization every 6 (six) hours as needed.  . lidocaine (LIDODERM) 5 % Place 1 patch onto the skin daily. Apply to right knee and lumbar Remove & Discard patch within 12 hours or as directed by MD  . losartan (COZAAR) 25 MG tablet Take 25 mg by mouth every morning. For HTN  . metFORMIN (GLUCOPHAGE-XR) 500 MG 24 hr tablet Take 500 mg by mouth every evening. For diabetes  . omeprazole (PRILOSEC) 20 MG capsule Take 20 mg by mouth daily. For GERD  . potassium chloride (K-DUR) 10 MEQ tablet Take 30 mEq by mouth daily. For potassium  . traMADol (ULTRAM) 50 MG tablet Take 50 mg by mouth every 6 (six) hours as needed.  . traMADol-acetaminophen (ULTRACET) 37.5-325 MG tablet Take 2 tablets by mouth every 6 (six) hours as needed for severe pain.   No facility-administered  encounter medications on file as of 12/09/2016.     Allergies (verified) Tequin [gatifloxacin]   History: Past Medical History:  Diagnosis Date  . Chronic diastolic heart failure (HCC)   . Diabetes mellitus   . Diabetes mellitus without complication (HCC)   . Gout   . Hypertension    Past Surgical History:  Procedure Laterality Date  . NO PAST SURGERIES     History reviewed. No pertinent family history. Social History   Occupational History  . Not on file.   Social History Main Topics  . Smoking status: Former Games developermoker  . Smokeless tobacco: Never Used  . Alcohol use No  . Drug use: No  . Sexual activity: Not Currently    Tobacco Counseling Counseling given: Not Answered   Activities of Daily Living In your present state of health, do you have any difficulty performing the following activities: 12/09/2016  Hearing? N  Vision? N  Difficulty concentrating or making decisions? Y  Walking or climbing stairs? Y  Dressing or bathing? Y  Doing errands, shopping? Y  Preparing Food and eating ? Y  Using the Toilet? Y  In the past six months, have you accidently leaked urine? Y  Do you have problems with loss of bowel control? Y  Managing your Medications? Y  Managing your Finances? Y  Housekeeping  or managing your Housekeeping? Y  Some recent data might [place]ia hidden    Immunizations and Health Maintenance Immunization History  Administered Date(s) Administered  . Influenza-Unspecified 03/23/2015, 04/06/2016  . PPD Test 08/12/2013  . Pneumococcal Polysaccharide-23 06/23/2013   Health Maintenance Due  Topic Date Due  . HEMOGLOBIN A1C  09/22/2016    [name], MD as PCP - General (Internal Medicine)  Indicate any recent Medical Services you may have received from other than [facility] providers in the past year (date may Tamma approximate).     Assessment:   This is a routine wellness examination for Margaret [name].   Hearing/Vision screen No exam data  present  Dietary issues and exercise activities discussed: Current Exercise Habits: The patient does not participate in regular exercise at present, Exercise limited by: cardiac condition(s)  Goals    . Maintain Lifestyle          Pt will maintain lifestyle.       Depression Screen PHQ 2/9 Scores 12/09/2016  PHQ - 2 Score 0    Fall Risk Fall Risk  12/09/2016  Falls in the past year? Yes  Number falls in past yr: 1  Injury with Fall? Yes    Cognitive Function: Within Normal Limits        Screening Tests Health Maintenance  Topic Date Due  . HEMOGLOBIN A1C  09/22/2016  . DEXA SCAN  06/28/2023 (Originally 06/27/1999)  . TETANUS/TDAP  06/28/2023 (Originally 06/26/1951)  . PNA vac Low Risk Adult (2 of 2 - PCV13) 06/28/2023 (Originally 06/23/2014)  . INFLUENZA VACCINE  01/25/2017  . OPHTHALMOLOGY EXAM  05/03/2017  . FOOT EXAM  09/14/2017      Plan:    I have personally reviewed and addressed the Medicare Annual Wellness questionnaire and have noted the following in the patient's chart:  A. Medical and social history B. Use of alcohol, tobacco or illicit drugs  C. Current medications and supplements D. Functional ability and status E.  Nutritional status F.  Physical activity G. Advance directives H. List of other physicians I.  Hospitalizations, surgeries, and ER visits in previous 12 months J.  Vitals K. Screenings to include hearing, vision, cognitive, depression L. Referrals and appointments - none  In addition, I have reviewed and discussed with patient certain preventive protocols, quality metrics, and best practice recommendations. A written personalized care plan for preventive services as well as general preventive health recommendations were provided to patient.  See attached scanned questionnaire for additional information.   Signed,   Annetta Maw, RN Nurse Health Advisor   Quick Notes   Health Maintenance: PNA 13, TDAP and DEXA  due     Abnormal Screen: none     Patient Concerns: None     Nurse Concerns: None

## 2016-12-09 NOTE — Patient Instructions (Signed)
[name] , Thank you for taking time to come for your Medicare Wellness Visit. I appreciate your ongoing commitment to your health goals. Please review the following plan we discussed and let me know if I can assist you in the future.   Screening recommendations/referrals: Colonoscopy up to date, pt over age75 Mammogram up to date, pt over age 81 Bone Density due Recommended yearly ophthalmology/optometry visit for glaucoma screening and checkup Recommended yearly dental visit for hygiene and checkup  Vaccinations: Influenza vaccine up to date. Due 04/14/2017 Pneumococcal vaccine 13 due Tdap vaccine due Shingles vaccine not in records    Advanced directives: Need a copy for chart  Conditions/risks identified: None  Next appointment: Dr. Lyn [name] does monthly rounds   Preventive Care 65 Years and Older, Female Preventive care refers to lifestyle choices and visits with your health care provider that can promote health and wellness. What does preventive care include?  A yearly physical exam. This is also called an annual well check.  Dental exams once or twice a year.  Routine eye exams. Ask your health care provider how often you should have your eyes checked.  Personal lifestyle choices, including:  Daily care of your teeth and gums.  Regular physical activity.  Eating a healthy diet.  Avoiding tobacco and drug use.  Limiting alcohol use.  Practicing safe sex.  Taking low-dose aspirin every day.  Taking vitamin and mineral supplements as recommended by your health care provider. What happens during an annual well check? The services and screenings done by your health care provider during your annual well check will depend on your age, overall health, lifestyle risk factors, and family history of disease. Counseling  Your health care provider may ask you questions about your:  Alcohol use.  Tobacco use.  Drug use.  Emotional well-being.  Home and  relationship well-being.  Sexual activity.  Eating habits.  History of falls.  Memory and ability to understand (cognition).  Work and work Astronomerenvironment.  Reproductive health. Screening  You may have the following tests or measurements:  Height, weight, and BMI.  Blood pressure.  Lipid and cholesterol levels. These may Margaret [name] checked every 5 years, or more frequently if you are over 81 years old.  Skin check.  Lung cancer screening. You may have this screening every year starting at age 81 if you have a 30-pack-year history of smoking and currently smoke or have quit within the past 15 years.  Fecal occult blood test (FOBT) of the stool. You may have this test every year starting at age 81.  Flexible sigmoidoscopy or colonoscopy. You may have a sigmoidoscopy every 5 years or a colonoscopy every 10 years starting at age 81.  Hepatitis C blood test.  Hepatitis B blood test.  Sexually transmitted disease (STD) testing.  Diabetes screening. This is done by checking your blood sugar (glucose) after you have not eaten for a while (fasting). You may have this done every 1-3 years.  Bone density scan. This is done to screen for osteoporosis. You may have this done starting at age 81.  Mammogram. This may Margaret [name] done every 1-2 years. Talk to your health care provider about how often you should have regular mammograms. Talk with your health care provider about your test results, treatment options, and if necessary, the need for more tests. Vaccines  Your health care provider may recommend certain vaccines, such as:  Influenza vaccine. This is recommended every year.  Tetanus, diphtheria, and acellular pertussis (Tdap, Td) vaccine.  You may need a Td booster every 10 years.  Zoster vaccine. You may need this after age 24.  Pneumococcal 13-valent conjugate (PCV13) vaccine. One dose is recommended after age 42.  Pneumococcal polysaccharide (PPSV23) vaccine. One dose is recommended after  age 68. Talk to your health care provider about which screenings and vaccines you need and how often you need them. This information is not intended to replace advice given to you by your health care provider. Make sure you discuss any questions you have with your health care provider. Document Released: 07/10/2015 Document Revised: 03/02/2016 Document Reviewed: 04/14/2015 Elsevier Interactive Patient Education  2017 [place] Prevention in the Home Falls can cause injuries. They can happen to people of all ages. There are many things you can do to make your home safe and to help prevent falls. What can I do on the outside of my home?  Regularly fix the edges of walkways and driveways and fix any cracks.  Remove anything that might make you trip as you walk through a door, such as a raised step or threshold.  Trim any bushes or trees on the path to your home.  Use bright outdoor lighting.  Clear any walking paths of anything that might make someone trip, such as rocks or tools.  Regularly check to see if handrails are loose or broken. Make sure that both sides of any steps have handrails.  Any raised decks and porches should have guardrails on the edges.  Have any leaves, snow, or ice cleared regularly.  Use sand or salt on walking paths during winter.  Clean up any spills in your garage right away. This includes oil or grease spills. What can I do in the bathroom?  Use night lights.  Install grab bars by the toilet and in the tub and shower. Do not use towel bars as grab bars.  Use non-skid mats or decals in the tub or shower.  If you need to sit down in the shower, use a plastic, non-slip stool.  Keep the floor dry. Clean up any water that spills on the floor as soon as it happens.  Remove soap buildup in the tub or shower regularly.  Attach bath mats securely with double-sided non-slip rug tape.  Do not have throw rugs and other things on the floor that can  make you trip. What can I do in the bedroom?  Use night lights.  Make sure that you have a light by your bed that is easy to reach.  Do not use any sheets or blankets that are too big for your bed. They should not hang down onto the floor.  Have a firm chair that has side arms. You can use this for support while you get dressed.  Do not have throw rugs and other things on the floor that can make you trip. What can I do in the kitchen?  Clean up any spills right away.  Avoid walking on wet floors.  Keep items that you use a lot in easy-to-reach places.  If you need to reach something above you, use a strong step stool that has a grab bar.  Keep electrical cords out of the way.  Do not use floor polish or wax that makes floors slippery. If you must use wax, use non-skid floor wax.  Do not have throw rugs and other things on the floor that can make you trip. What can I do with my stairs?  Do not leave any  items on the stairs.  Make sure that there are handrails on both sides of the stairs and use them. Fix handrails that are broken or loose. Make sure that handrails are as long as the stairways.  Check any carpeting to make sure that it is firmly attached to the stairs. Fix any carpet that is loose or worn.  Avoid having throw rugs at the top or bottom of the stairs. If you do have throw rugs, attach them to the floor with carpet tape.  Make sure that you have a light switch at the top of the stairs and the bottom of the stairs. If you do not have them, ask someone to add them for you. What else can I do to help prevent falls?  Wear shoes that:  Do not have high heels.  Have rubber bottoms.  Are comfortable and fit you well.  Are closed at the toe. Do not wear sandals.  If you use a stepladder:  Make sure that it is fully opened. Do not climb a closed stepladder.  Make sure that both sides of the stepladder are locked into place.  Ask someone to hold it for you,  if possible.  Clearly mark and make sure that you can see:  Any grab bars or handrails.  First and last steps.  Where the edge of each step is.  Use tools that help you move around (mobility aids) if they are needed. These include:  Canes.  Walkers.  Scooters.  Crutches.  Turn on the lights when you go into a dark area. Replace any light bulbs as soon as they burn out.  Set up your furniture so you have a clear path. Avoid moving your furniture around.  If any of your floors are uneven, fix them.  If there are any pets around you, [place] aware of where they are.  Review your medicines with your doctor. Some medicines can make you feel dizzy. This can increase your chance of falling. Ask your doctor what other things that you can do to help prevent falls. This information is not intended to replace advice given to you by your health care provider. Make sure you discuss any questions you have with your health care provider. Document Released: 04/09/2009 Document Revised: 11/19/2015 Document Reviewed: 07/18/2014 Elsevier Interactive Patient Education  2017 [employer].

## 2016-12-11 ENCOUNTER — Encounter: Payer: Self-pay | Admitting: Internal Medicine

## 2016-12-11 DIAGNOSIS — M1990 Unspecified osteoarthritis, unspecified site: Secondary | ICD-10-CM | POA: Diagnosis not present

## 2016-12-11 DIAGNOSIS — M6281 Muscle weakness (generalized): Secondary | ICD-10-CM | POA: Diagnosis not present

## 2016-12-11 NOTE — Assessment & Plan Note (Signed)
Controlled ; cont cozaar 25 mg daily and lasix 40 mg daily

## 2016-12-11 NOTE — Assessment & Plan Note (Signed)
No c/o indigestion or reflux; plan to cont omeprazole 20 mg dailyu

## 2016-12-11 NOTE — Assessment & Plan Note (Signed)
No signs of pain or reports of sx; plan to cont neurontin 300 mg qHS

## 2016-12-12 ENCOUNTER — Non-Acute Institutional Stay (SKILLED_NURSING_FACILITY): Payer: Medicare Other | Admitting: Internal Medicine

## 2016-12-12 ENCOUNTER — Encounter: Payer: Self-pay | Admitting: Internal Medicine

## 2016-12-12 DIAGNOSIS — M1990 Unspecified osteoarthritis, unspecified site: Secondary | ICD-10-CM | POA: Diagnosis not present

## 2016-12-12 DIAGNOSIS — K219 Gastro-esophageal reflux disease without esophagitis: Secondary | ICD-10-CM | POA: Diagnosis not present

## 2016-12-12 DIAGNOSIS — I5032 Chronic diastolic (congestive) heart failure: Secondary | ICD-10-CM | POA: Diagnosis not present

## 2016-12-12 DIAGNOSIS — F331 Major depressive disorder, recurrent, moderate: Secondary | ICD-10-CM | POA: Diagnosis not present

## 2016-12-12 DIAGNOSIS — G3184 Mild cognitive impairment, so stated: Secondary | ICD-10-CM | POA: Diagnosis not present

## 2016-12-12 DIAGNOSIS — Z23 Encounter for immunization: Secondary | ICD-10-CM | POA: Diagnosis not present

## 2016-12-12 DIAGNOSIS — M6281 Muscle weakness (generalized): Secondary | ICD-10-CM | POA: Diagnosis not present

## 2016-12-12 DIAGNOSIS — F419 Anxiety disorder, unspecified: Secondary | ICD-10-CM | POA: Diagnosis not present

## 2016-12-12 DIAGNOSIS — E1142 Type 2 diabetes mellitus with diabetic polyneuropathy: Secondary | ICD-10-CM | POA: Diagnosis not present

## 2016-12-12 NOTE — Progress Notes (Signed)
[facility] Room Number: 205D Place of Service:  SNF (31)  Margaret [name], Margaret Saulters D, MD  Patient Care Team: Margaret [name], Margaret [name] D, MD as PCP - General (Internal Medicine)  Extended Emergency Contact Information Primary Emergency Contact: Russell,[name] Address: [address]          [place], [place] [address] [place] Home Phone: [phone] Mobile Phone: [phone] Relation: Sister Secondary Emergency Contact: [name],[name] Address: [address]          [place], [place] [address] [place] Home Phone: [phone] Relation: Grandaughter    Allergies: Tequin [gatifloxacin]  Chief Complaint  Patient presents with  . Medical Management of Chronic Issues    routine    HPI: Patient is 83 y.o. female who being seen for routine issues of chronic congestive heart failure, diabetes mellitus type 2, and GERD.  Past Medical History:  Diagnosis Date  . Breast mass, right 12/13/2013  . Chronic diastolic CHF (congestive heart failure) (HCC) 07/19/2013  . Chronic diastolic heart failure (HCC)   . Chronic kidney disease, stage II (mild)   . Chronic renal disease, stage 2, mildly decreased glomerular filtration rate (GFR) between 60-89 mL/min/1.73 square meter 09/03/2014  . Closed T12 fracture (HCC) 07/16/2013  . Depression 07/18/2013  . Diabetes mellitus   . Diabetes mellitus due to underlying condition (HCC) 06/21/2013  . Diabetes mellitus without complication (HCC)   . Dysphagia, oropharyngeal phase   . GERD (gastroesophageal reflux disease) 12/17/2014  . Gout   . Hypertension   . Hypertensive heart disease with congestive heart failure (HCC) 06/21/2013  . Iron deficiency anemia 09/03/2014  . Iron deficiency anemia, unspecified   . Leukocytosis 07/18/2013  . Lumbago 10/23/2013  . Lung nodule < 6cm on CT 11/13/2016  . Osteopenia 07/16/2013  . Syncope 06/21/2013  . Type 2 diabetes, controlled, with neuropathy (HCC)  10/18/2013  . Unspecified constipation 10/18/2013  . Vitamin [name] deficiency 04/11/2016    Past Surgical History:  Procedure Laterality Date  . NO PAST SURGERIES      Allergies as of 12/12/2016      Reactions   Tequin [gatifloxacin]       Medication List       Accurate as of 12/12/16 11:59 PM. Always use your most recent med list.          acetaminophen 325 MG tablet Commonly known as:  TYLENOL Take 650 mg by mouth every 8 (eight) hours as needed.   acetaminophen 500 MG tablet Commonly known as:  TYLENOL Take 1,000 mg by mouth 3 (three) times daily.   ARTIFICIAL TEARS OP Apply to eye. One drop to each eye four times a day for dry eyes   aspirin 81 MG chewable tablet Chew 1 tablet (81 mg total) by mouth daily.   ferrous sulfate 325 (65 FE) MG tablet Take 325 mg by mouth daily with breakfast.   furosemide 40 MG tablet Commonly known as:  LASIX Take 40 mg by mouth every morning.   gabapentin 300 MG capsule Commonly known as:  NEURONTIN Take 1 capsule (300 mg total) by mouth at bedtime.   ipratropium-albuterol 0.5-2.5 (3) MG/3ML Soln Commonly known as:  DUONEB Take 3 mLs by nebulization every 6 (six) hours as needed.   lidocaine 5 % Commonly known as:  LIDODERM Place 1 patch onto the skin daily. Apply to right knee and lumbar Remove & Discard patch within 12 hours or as directed by MD  losartan 25 MG tablet Commonly known as:  COZAAR Take 25 mg by mouth every morning. For HTN   metFORMIN 500 MG 24 hr tablet Commonly known as:  GLUCOPHAGE-XR Take 500 mg by mouth every evening. For diabetes   omeprazole 20 MG capsule Commonly known as:  PRILOSEC Take 20 mg by mouth daily. For GERD   potassium chloride 10 MEQ tablet Commonly known as:  K-DUR Take 30 mEq by mouth daily. For potassium   traMADol 50 MG tablet Commonly known as:  ULTRAM Take 50 mg by mouth every 6 (six) hours as needed.   traMADol-acetaminophen 37.5-325 MG tablet Commonly known as:   ULTRACET Take 2 tablets by mouth every 6 (six) hours as needed for severe pain.       Meds ordered this encounter  Medications  . Hypromellose (ARTIFICIAL TEARS OP)    Sig: Apply to eye. One drop to each eye four times a day for dry eyes    Immunization History  Administered Date(s) Administered  . Influenza-Unspecified 03/23/2015, 04/06/2016  . PPD Test 08/12/2013  . Pneumococcal Polysaccharide-23 06/23/2013    Social History  Substance Use Topics  . Smoking status: Former Games developer  . Smokeless tobacco: Never Used  . Alcohol use No    Review of Systems  DATA OBTAINED: from nurse GENERAL:  no fevers, fatigue, appetite changes SKIN: No itching, rash HEENT: No complaint RESPIRATORY: No cough, wheezing, SOB CARDIAC: No chest pain, palpitations, lower extremity edema  GI: No abdominal pain, No N/V/[name] or constipation, No heartburn or reflux  GU: No dysuria, frequency or urgency, or incontinence  MUSCULOSKELETAL: No unrelieved bone/joint pain NEUROLOGIC: No headache, dizziness  PSYCHIATRIC: No overt anxiety or sadness  Vitals:   12/12/16 1257  BP: 122/84  Pulse: 77  Resp: 18  Temp: 98.8 F (37.1 C)   Body mass index is 20.15 kg/m. Physical Exam  GENERAL APPEARANCE: Alert, No acute distress  SKIN: No diaphoresis rash HEENT: Unremarkable RESPIRATORY: Breathing is even, unlabored. Lung sounds are clear   CARDIOVASCULAR: Heart RRR no murmurs, rubs or gallops. No peripheral edema  GASTROINTESTINAL: Abdomen is soft, non-tender, not distended w/ normal bowel sounds.  GENITOURINARY: Bladder non tender, not distended  MUSCULOSKELETAL: No abnormal joints or musculature NEUROLOGIC: Cranial nerves 2-12 grossly intact. Moves all extremities PSYCHIATRIC: Mood and affect appropriate to situation, no behavioral issues  Patient Active Problem List   Diagnosis Date Noted  . Lung nodule < 6cm on CT 11/13/2016  . Vitamin [name] deficiency 04/11/2016  . Hyperlipidemia 02/11/2016  .  Pain 11/07/2015  . Wheezing 11/07/2015  . Edema 10/25/2015  . Conjunctivitis 07/09/2015  . UTI (urinary tract infection) 03/24/2015  . GERD (gastroesophageal reflux disease) 12/17/2014  . Hypokalemia 12/17/2014  . Depression 11/25/2014  . Iron deficiency anemia 09/03/2014  . Chronic renal disease, stage 2, mildly decreased glomerular filtration rate (GFR) between 60-89 mL/min/1.73 square meter 09/03/2014  . Pain in joint, lower leg 08/21/2014  . Cough 01/27/2014  . Dizziness 01/12/2014  . Contusion of unspecified site 01/12/2014  . Breast mass, right 12/13/2013  . Lumbago 10/23/2013  . Type 2 diabetes, controlled, with neuropathy (HCC) 10/18/2013  . 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 Unspecified constipation 10/18/2013  . Plantar fasciitis, left 07/29/2013  . Physical deconditioning 07/21/2013  . Diabetes mellitus, controlled (HCC) 07/19/2013  . Chronic diastolic CHF (congestive heart failure) (HCC) 07/19/2013  . HCAP (healthcare-associated pneumonia) 07/18/2013  . Chronic diastolic heart failure (HCC) 06/24/2013  .  Syncope 06/21/2013  . Syncope and collapse 06/21/2013  . Leukocytosis 06/21/2013  . Hypertensive heart disease with congestive heart failure (HCC) 06/21/2013  . Diabetes mellitus due to underlying condition (HCC) 06/21/2013    CMP     Component Value Date/Time   NA 141 10/09/2016 1425   NA 141 10/09/2016   K 4.6 10/09/2016 1425   CL 106 10/09/2016 1425   CO2 26 10/09/2016 1425   GLUCOSE 97 10/09/2016 1425   BUN 13 10/09/2016 1425   BUN 13 10/09/2016   CREATININE 0.82 10/09/2016 1425   CALCIUM 8.8 (L) 10/09/2016 1425   PROT 6.4 (L) 10/09/2016 1425   ALBUMIN 3.4 (L) 10/09/2016 1425   AST 19 10/09/2016 1425   ALT 12 (L) 10/09/2016 1425   ALKPHOS 73 10/09/2016 1425   BILITOT 0.5 10/09/2016 1425   GFRNONAA >60 10/09/2016 1425   GFRAA >60 10/09/2016 1425    Recent Labs  03/30/16 10/09/16 10/09/16 1425  NA  142 141 141  K 4.2  --  4.6  CL  --   --  106  CO2  --   --  26  GLUCOSE  --   --  97  BUN 10 13 13   CREATININE 0.8 0.8 0.82  CALCIUM  --   --  8.8*    Recent Labs  03/25/16 03/30/16 10/09/16 1425  AST 26 21 19   ALT 11 13 12*  ALKPHOS 76 74 73  BILITOT  --   --  0.5  PROT  --   --  6.4*  ALBUMIN  --   --  3.4*    Recent Labs  03/25/16 10/09/16 10/09/16 1425  WBC 5.2 6.6 6.6  NEUTROABS  --   --  5.0  HGB 12.4  --  11.7*  HCT 37  --  36.1  MCV  --   --  91.2  PLT 177  --  156    Recent Labs  05/17/16  CHOL 205*  LDLCALC 131  TRIG 119   No results found for: [facility] Lab Results  Component Value Date   TSH 0.41 03/25/2016   Lab Results  Component Value Date   HGBA1C 5.7 12/13/2016   Lab Results  Component Value Date   CHOL 205 (A) 05/17/2016   HDL 51 05/17/2016   LDLCALC 131 05/17/2016   TRIG 119 05/17/2016   CHOLHDL 3.2 06/22/2013    Significant Diagnostic Results in last 30 days:  No results found.  Assessment and Plan  Chronic diastolic CHF (congestive heart failure)  Chronic and stable; plan to continue Lasix 40 mg dailyand Cozaar 25 mg daily  Diabetes mellitus, controlled  Most recent A1c is 5.7 which is excellent; plan tonue Glucophage XL 500 mg by mouth daily patient is on an  ARB  GERD (gastroesophageal reflux disease)  No complaint of reflux; plan to continue omeprazole 20 mg by mouth daily    Rozann Holts [name]. Lyn Hollingshead, MD

## 2016-12-13 DIAGNOSIS — M1990 Unspecified osteoarthritis, unspecified site: Secondary | ICD-10-CM | POA: Diagnosis not present

## 2016-12-13 DIAGNOSIS — M6281 Muscle weakness (generalized): Secondary | ICD-10-CM | POA: Diagnosis not present

## 2016-12-13 DIAGNOSIS — E119 Type 2 diabetes mellitus without complications: Secondary | ICD-10-CM | POA: Diagnosis not present

## 2016-12-13 LAB — HEMOGLOBIN A1C: HEMOGLOBIN A1C: 5.7

## 2016-12-14 DIAGNOSIS — M6281 Muscle weakness (generalized): Secondary | ICD-10-CM | POA: Diagnosis not present

## 2016-12-14 DIAGNOSIS — M1990 Unspecified osteoarthritis, unspecified site: Secondary | ICD-10-CM | POA: Diagnosis not present

## 2016-12-16 DIAGNOSIS — M6281 Muscle weakness (generalized): Secondary | ICD-10-CM | POA: Diagnosis not present

## 2016-12-16 DIAGNOSIS — M1990 Unspecified osteoarthritis, unspecified site: Secondary | ICD-10-CM | POA: Diagnosis not present

## 2016-12-19 DIAGNOSIS — M6281 Muscle weakness (generalized): Secondary | ICD-10-CM | POA: Diagnosis not present

## 2016-12-19 DIAGNOSIS — M1990 Unspecified osteoarthritis, unspecified site: Secondary | ICD-10-CM | POA: Diagnosis not present

## 2016-12-20 DIAGNOSIS — M1990 Unspecified osteoarthritis, unspecified site: Secondary | ICD-10-CM | POA: Diagnosis not present

## 2016-12-20 DIAGNOSIS — M6281 Muscle weakness (generalized): Secondary | ICD-10-CM | POA: Diagnosis not present

## 2016-12-21 DIAGNOSIS — M6281 Muscle weakness (generalized): Secondary | ICD-10-CM | POA: Diagnosis not present

## 2016-12-21 DIAGNOSIS — M1990 Unspecified osteoarthritis, unspecified site: Secondary | ICD-10-CM | POA: Diagnosis not present

## 2016-12-22 DIAGNOSIS — M6281 Muscle weakness (generalized): Secondary | ICD-10-CM | POA: Diagnosis not present

## 2016-12-22 DIAGNOSIS — M1990 Unspecified osteoarthritis, unspecified site: Secondary | ICD-10-CM | POA: Diagnosis not present

## 2017-01-10 ENCOUNTER — Non-Acute Institutional Stay (SKILLED_NURSING_FACILITY): Payer: Medicare Other | Admitting: Internal Medicine

## 2017-01-10 DIAGNOSIS — N182 Chronic kidney disease, stage 2 (mild): Secondary | ICD-10-CM

## 2017-01-10 DIAGNOSIS — B351 Tinea unguium: Secondary | ICD-10-CM | POA: Diagnosis not present

## 2017-01-10 DIAGNOSIS — E1142 Type 2 diabetes mellitus with diabetic polyneuropathy: Secondary | ICD-10-CM

## 2017-01-10 DIAGNOSIS — E1140 Type 2 diabetes mellitus with diabetic neuropathy, unspecified: Secondary | ICD-10-CM | POA: Diagnosis not present

## 2017-01-10 DIAGNOSIS — M79675 Pain in left toe(s): Secondary | ICD-10-CM | POA: Diagnosis not present

## 2017-01-10 DIAGNOSIS — I110 Hypertensive heart disease with heart failure: Secondary | ICD-10-CM | POA: Diagnosis not present

## 2017-01-10 DIAGNOSIS — Z7984 Long term (current) use of oral hypoglycemic drugs: Secondary | ICD-10-CM | POA: Diagnosis not present

## 2017-01-10 DIAGNOSIS — I5022 Chronic systolic (congestive) heart failure: Secondary | ICD-10-CM

## 2017-01-10 NOTE — Progress Notes (Signed)
[facility] Room Number: 205D Place of Service:  SNF (31)  [name], Jakeob Tullis D, MD  Patient Care Team: [name], Johnice Riebe D, MD as PCP - General (Internal Medicine)  Extended Emergency Contact Information Primary Emergency Contact: Udall,[name] Address: [address]          [place], [place] [address] [place] Home Phone: [phone] Mobile Phone: [phone] Relation: Sister Secondary Emergency Contact: [name],[name] Address: [address]          [place], [place] [address] [place] Home Phone: [phone] Relation: Grandaughter    Allergies: Tequin [gatifloxacin]  Chief Complaint  Patient presents with  . Medical Management of Chronic Issues    routine visit    HPI: Patient is 83 y.o. female who Is being seen for routine issues of diabetic polyneuropathy chronic kidney disease stage II and hypertension.  Past Medical History:  Diagnosis Date  . Breast mass, right 12/13/2013  . Chronic diastolic CHF (congestive heart failure) (HCC) 07/19/2013  . Chronic diastolic heart failure (HCC)   . Chronic kidney disease, stage II (mild)   . Chronic renal disease, stage 2, mildly decreased glomerular filtration rate (GFR) between 60-89 mL/min/1.73 square meter 09/03/2014  . Closed T12 fracture (HCC) 07/16/2013  . Depression 07/18/2013  . Diabetes mellitus   . Diabetes mellitus due to underlying condition (HCC) 06/21/2013  . Diabetes mellitus without complication (HCC)   . Dysphagia, oropharyngeal phase   . GERD (gastroesophageal reflux disease) 12/17/2014  . Gout   . Hypertension   . Hypertensive heart disease with congestive heart failure (HCC) 06/21/2013  . Iron deficiency anemia 09/03/2014  . Iron deficiency anemia, unspecified   . Leukocytosis 07/18/2013  . Lumbago 10/23/2013  . Lung nodule < 6cm on CT 11/13/2016  . Osteopenia 07/16/2013  . Syncope 06/21/2013  . Type 2 diabetes, controlled, with  neuropathy (HCC) 10/18/2013  . Unspecified constipation 10/18/2013  . Vitamin D deficiency 04/11/2016    Past Surgical History:  Procedure Laterality Date  . NO PAST SURGERIES      Allergies as of 01/10/2017      Reactions   Tequin [gatifloxacin]       Medication List       Accurate as of 01/10/17 11:59 PM. Always use your most recent med list.          acetaminophen 325 MG tablet Commonly known as:  TYLENOL Take 650 mg by mouth every 8 (eight) hours as needed.   acetaminophen 500 MG tablet Commonly known as:  TYLENOL Take 1,000 mg by mouth 3 (three) times daily.   ARTIFICIAL TEARS OP Apply to eye. One drop to each eye four times a day for dry eyes   aspirin 81 MG chewable tablet Chew 1 tablet (81 mg total) by mouth daily.   ferrous sulfate 325 (65 FE) MG tablet Take 325 mg by mouth daily with breakfast.   furosemide 40 MG tablet Commonly known as:  LASIX Take 40 mg by mouth every morning.   gabapentin 300 MG capsule Commonly known as:  NEURONTIN Take 1 capsule (300 mg total) by mouth at bedtime.   ipratropium-albuterol 0.5-2.5 (3) MG/3ML Soln Commonly known as:  DUONEB Take 3 mLs by nebulization every 6 (six) hours as needed.   lidocaine 5 % Commonly known as:  LIDODERM Place 1 patch onto the skin daily. Apply to right knee and lumbar Remove & Discard patch within 12 hours or as directed by  MD   losartan 25 MG tablet Commonly known as:  COZAAR Take 25 mg by mouth every morning. For HTN   metFORMIN 500 MG 24 hr tablet Commonly known as:  GLUCOPHAGE-XR Take 500 mg by mouth every evening. For diabetes   omeprazole 20 MG capsule Commonly known as:  PRILOSEC Take 20 mg by mouth daily. For GERD   potassium chloride 10 MEQ tablet Commonly known as:  K-DUR Take 30 mEq by mouth daily. For potassium   traMADol 50 MG tablet Commonly known as:  ULTRAM Take 50 mg by mouth every 6 (six) hours as needed.   traMADol-acetaminophen 37.5-325 MG tablet Commonly  known as:  ULTRACET Take 2 tablets by mouth every 6 (six) hours as needed for severe pain.       No orders of the defined types were placed in this encounter.   Immunization History  Administered Date(s) Administered  . Influenza-Unspecified 03/23/2015, 04/06/2016  . PPD Test 08/12/2013  . Pneumococcal Polysaccharide-23 06/23/2013    Social History  Substance Use Topics  . Smoking status: Former Games developer  . Smokeless tobacco: Never Used  . Alcohol use No    Review of Systems  DATA OBTAINED: from Nurse GENERAL:  no fevers, fatigue, appetite changes SKIN: No itching, rash HEENT: No complaint RESPIRATORY: No cough, wheezing, SOB CARDIAC: No chest pain, palpitations, lower extremity edema  GI: No abdominal pain, No N/V/D or constipation, No heartburn or reflux  GU: No dysuria, frequency or urgency, or incontinence  MUSCULOSKELETAL: No unrelieved bone/joint pain NEUROLOGIC: No headache, dizziness  PSYCHIATRIC: No overt anxiety or sadness  Vitals:   01/10/17 1437  BP: 122/84  Pulse: 73  Resp: 18  Temp: 98.8 F (37.1 C)   Body mass index is 22.29 kg/m. Physical Exam  GENERAL APPEARANCE: Alert, No acute distress  SKIN: No diaphoresis rash HEENT: Unremarkable RESPIRATORY: Breathing is even, unlabored. Lung sounds are clear   CARDIOVASCULAR: Heart RRR no murmurs, rubs or gallops. No peripheral edema  GASTROINTESTINAL: Abdomen is soft, non-tender, not distended w/ normal bowel sounds.  GENITOURINARY: Bladder non tender, not distended  MUSCULOSKELETAL: No abnormal joints or musculature NEUROLOGIC: Cranial nerves 2-12 grossly intact. Moves all extremities But uses wheelchair to ambulate PSYCHIATRIC: Mood and affect appropriate to situation, no behavioral issues  Patient Active Problem List   Diagnosis Date Noted  . Lung nodule < 6cm on CT 11/13/2016  . Vitamin D deficiency 04/11/2016  . Hyperlipidemia 02/11/2016  . Pain 11/07/2015  . Wheezing 11/07/2015  . Edema  10/25/2015  . Conjunctivitis 07/09/2015  . UTI (urinary tract infection) 03/24/2015  . GERD (gastroesophageal reflux disease) 12/17/2014  . Hypokalemia 12/17/2014  . Depression 11/25/2014  . Iron deficiency anemia 09/03/2014  . Chronic renal disease, stage 2, mildly decreased glomerular filtration rate (GFR) between 60-89 mL/min/1.73 square meter 09/03/2014  . Pain in joint, lower leg 08/21/2014  . Cough 01/27/2014  . Dizziness 01/12/2014  . Contusion of unspecified site 01/12/2014  . Breast mass, right 12/13/2013  . Lumbago 10/23/2013  . Type 2 diabetes, controlled, with neuropathy (HCC) 10/18/2013  . 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 Unspecified constipation 10/18/2013  . Plantar fasciitis, left 07/29/2013  . Physical deconditioning 07/21/2013  . Diabetes mellitus, controlled (HCC) 07/19/2013  . Chronic diastolic CHF (congestive heart failure) (HCC) 07/19/2013  . HCAP (healthcare-associated pneumonia) 07/18/2013  . Chronic diastolic heart failure (HCC) 06/24/2013  . Syncope 06/21/2013  . Syncope and collapse 06/21/2013  . Leukocytosis 06/21/2013  .  Hypertensive heart disease with congestive heart failure (HCC) 06/21/2013  . Diabetes mellitus due to underlying condition (HCC) 06/21/2013    CMP     Component Value Date/Time   NA 145 01/16/2017   K 3.6 01/16/2017   CL 106 10/09/2016 1425   CO2 26 10/09/2016 1425   GLUCOSE 97 10/09/2016 1425   BUN 19 01/16/2017   CREATININE 0.9 01/16/2017   CREATININE 0.82 10/09/2016 1425   CALCIUM 8.8 (L) 10/09/2016 1425   PROT 6.4 (L) 10/09/2016 1425   ALBUMIN 3.4 (L) 10/09/2016 1425   AST 18 01/16/2017   ALT 9 01/16/2017   ALKPHOS 85 01/16/2017   BILITOT 0.5 10/09/2016 1425   GFRNONAA >60 10/09/2016 1425   GFRAA >60 10/09/2016 1425    Recent Labs  03/30/16 10/09/16 10/09/16 1425 01/16/17  NA 142 141 141 145  K 4.2  --  4.6 3.6  CL  --   --  106  --   CO2  --   --  26  --     GLUCOSE  --   --  97  --   BUN 10 13 13 19   CREATININE 0.8 0.8 0.82 0.9  CALCIUM  --   --  8.8*  --     Recent Labs  03/30/16 10/09/16 1425 01/16/17  AST 21 19 18   ALT 13 12* 9  ALKPHOS 74 73 85  BILITOT  --  0.5  --   PROT  --  6.4*  --   ALBUMIN  --  3.4*  --     Recent Labs  03/25/16 10/09/16 10/09/16 1425 01/16/17  WBC 5.2 6.6 6.6 5.9  NEUTROABS  --   --  5.0  --   HGB 12.4  --  11.7* 12.2  HCT 37  --  36.1 37  MCV  --   --  91.2  --   PLT 177  --  156 180    Recent Labs  05/17/16 01/16/17  CHOL 205* 166  LDLCALC 131 97  TRIG 119 100   No results found for: MICROALBUR Lab Results  Component Value Date   TSH 0.17 (A) 01/16/2017   Lab Results  Component Value Date   HGBA1C 5.7 01/16/2017   Lab Results  Component Value Date   CHOL 166 01/16/2017   HDL 49 01/16/2017   LDLCALC 97 01/16/2017   TRIG 100 01/16/2017   CHOLHDL 3.2 06/22/2013    Significant Diagnostic Results in last 30 days:  No results found.  Assessment and Plan  Peripheral sensory neuropathy due to type 2 diabetes mellitus No complaint of all problems; continue Neurontin 300 mg by mouth daily at bedtime  Chronic renal disease, stage 2, mildly decreased glomerular filtration rate (GFR) between 60-89 mL/min/1.73 square meter Chronic and stable; recent GFR was greater than 60; plan to monitor at intervals  Hypertensive heart disease with congestive heart failure Controlled and stable; continue Cozaar 25 mg by mouth daily and Lasix 40 mg by mouth daily     Gusta Marksberry D. Lyn Hollingshead, MD

## 2017-01-11 ENCOUNTER — Encounter: Payer: Self-pay | Admitting: Internal Medicine

## 2017-01-11 NOTE — Assessment & Plan Note (Signed)
No complaint of reflux; plan to continue omeprazole 20 mg by mouth daily

## 2017-01-11 NOTE — Assessment & Plan Note (Signed)
Most recent A1c is 5.7 which is excellent; plan tonue Glucophage XL 500 mg by mouth daily patient is on an  ARB

## 2017-01-11 NOTE — Assessment & Plan Note (Signed)
Chronic and stable; plan to continue Lasix 40 mg dailyand Cozaar 25 mg daily

## 2017-01-16 DIAGNOSIS — E039 Hypothyroidism, unspecified: Secondary | ICD-10-CM | POA: Diagnosis not present

## 2017-01-16 DIAGNOSIS — E119 Type 2 diabetes mellitus without complications: Secondary | ICD-10-CM | POA: Diagnosis not present

## 2017-01-16 DIAGNOSIS — E785 Hyperlipidemia, unspecified: Secondary | ICD-10-CM | POA: Diagnosis not present

## 2017-01-16 DIAGNOSIS — G3184 Mild cognitive impairment, so stated: Secondary | ICD-10-CM | POA: Diagnosis not present

## 2017-01-16 DIAGNOSIS — F419 Anxiety disorder, unspecified: Secondary | ICD-10-CM | POA: Diagnosis not present

## 2017-01-16 DIAGNOSIS — I10 Essential (primary) hypertension: Secondary | ICD-10-CM | POA: Diagnosis not present

## 2017-01-16 DIAGNOSIS — E559 Vitamin D deficiency, unspecified: Secondary | ICD-10-CM | POA: Diagnosis not present

## 2017-01-16 DIAGNOSIS — F331 Major depressive disorder, recurrent, moderate: Secondary | ICD-10-CM | POA: Diagnosis not present

## 2017-01-16 LAB — BASIC METABOLIC PANEL
BUN: 19 (ref 4–21)
Creatinine: 0.9 (ref 0.5–1.1)
Glucose: 70
POTASSIUM: 3.6 (ref 3.4–5.3)
SODIUM: 145 (ref 137–147)

## 2017-01-16 LAB — LIPID PANEL
CHOLESTEROL: 166 (ref 0–200)
HDL: 49 (ref 35–70)
LDL Cholesterol: 97
TRIGLYCERIDES: 100 (ref 40–160)

## 2017-01-16 LAB — CBC AND DIFFERENTIAL
HEMATOCRIT: 37 (ref 36–46)
HEMOGLOBIN: 12.2 (ref 12.0–16.0)
Platelets: 180 (ref 150–399)
WBC: 5.9

## 2017-01-16 LAB — HEPATIC FUNCTION PANEL
ALT: 9 (ref 7–35)
AST: 18 (ref 13–35)
Alkaline Phosphatase: 85 (ref 25–125)
Bilirubin, Total: 0.3

## 2017-01-16 LAB — TSH: TSH: 0.17 — AB (ref 0.41–5.90)

## 2017-01-16 LAB — VITAMIN D 25 HYDROXY (VIT D DEFICIENCY, FRACTURES): VIT D 25 HYDROXY: 27.42

## 2017-01-16 LAB — HEMOGLOBIN A1C: Hemoglobin A1C: 5.7

## 2017-01-18 ENCOUNTER — Other Ambulatory Visit: Payer: Self-pay

## 2017-01-25 DIAGNOSIS — Z961 Presence of intraocular lens: Secondary | ICD-10-CM | POA: Diagnosis not present

## 2017-01-25 DIAGNOSIS — E119 Type 2 diabetes mellitus without complications: Secondary | ICD-10-CM | POA: Diagnosis not present

## 2017-01-25 DIAGNOSIS — H3092 Unspecified chorioretinal inflammation, left eye: Secondary | ICD-10-CM | POA: Diagnosis not present

## 2017-02-09 ENCOUNTER — Encounter: Payer: Self-pay | Admitting: Internal Medicine

## 2017-02-09 NOTE — Assessment & Plan Note (Signed)
No complaint of all problems; continue Neurontin 300 mg by mouth daily at bedtime

## 2017-02-09 NOTE — Assessment & Plan Note (Signed)
Controlled and stable; continue Cozaar 25 mg by mouth daily and Lasix 40 mg by mouth daily

## 2017-02-09 NOTE — Assessment & Plan Note (Signed)
Chronic and stable; recent GFR was greater than 60; plan to monitor at intervals

## 2017-02-14 ENCOUNTER — Non-Acute Institutional Stay (SKILLED_NURSING_FACILITY): Payer: Medicare Other | Admitting: Internal Medicine

## 2017-02-14 ENCOUNTER — Encounter: Payer: Self-pay | Admitting: Internal Medicine

## 2017-02-14 DIAGNOSIS — I5032 Chronic diastolic (congestive) heart failure: Secondary | ICD-10-CM | POA: Diagnosis not present

## 2017-02-14 DIAGNOSIS — R4689 Other symptoms and signs involving appearance and behavior: Secondary | ICD-10-CM | POA: Diagnosis not present

## 2017-02-14 DIAGNOSIS — D509 Iron deficiency anemia, unspecified: Secondary | ICD-10-CM | POA: Diagnosis not present

## 2017-02-14 DIAGNOSIS — R488 Other symbolic dysfunctions: Secondary | ICD-10-CM | POA: Diagnosis not present

## 2017-02-14 DIAGNOSIS — M6281 Muscle weakness (generalized): Secondary | ICD-10-CM | POA: Diagnosis not present

## 2017-02-14 NOTE — Progress Notes (Signed)
Location:  [facility]   Place of Service:  SNF (31[facility]skilled nursing facility  [name], MD  [name], MD as PCP - General (Internal Medicine)  Extended Emergency Contact Information Primary Emergency Contact: Kuipers,[name] Address: [address]          [place], [place] [address] [name] of [place] Home Phone: [phone] Mobile Phone: [phone] Relation: Sister Secondary Emergency Contact: [name],[name] Address: [address]          [place], [place] [address] [name] of [place] Home Phone: [phone] Relation: Grandaughter    Allergies: Tequin [gatifloxacin]  Chief Complaint  Patient presents with  . Acute Visit    Patient was not acting like herself    HPI: Patient is 83 y.o. female who Nursing asked me to see because patient has been acting mood E in the last several days; she said, with roommates change roommate then came back to her original roommate; she's been throwing things; this is distinctly not like the patient however today with mood issues smiling and only waving and very pleasant. The patient and nurse denies cold coughs fever any problems with urination E pain anywhere.  Past Medical History:  Diagnosis Date  . Breast mass, right 12/13/2013  . Chronic diastolic CHF (congestive heart failure) (HCC) 07/19/2013  . Chronic diastolic heart failure (HCC)   . Chronic kidney disease, stage II (mild)   . Chronic renal disease, stage 2, mildly decreased glomerular filtration rate (GFR) between 60-89 mL/min/1.73 square meter 09/03/2014  . Closed T12 fracture (HCC) 07/16/2013  . Depression 07/18/2013  . Diabetes mellitus   . Diabetes mellitus due to underlying condition (HCC) 06/21/2013  . Diabetes mellitus without complication (HCC)   . Dysphagia, oropharyngeal phase   . GERD (gastroesophageal reflux disease) 12/17/2014  . Gout   . Hypertension   . Hypertensive heart disease with  congestive heart failure (HCC) 06/21/2013  . Iron deficiency anemia 09/03/2014  . Iron deficiency anemia, unspecified   . Leukocytosis 07/18/2013  . Lumbago 10/23/2013  . Lung nodule < 6cm on CT 11/13/2016  . Osteopenia 07/16/2013  . Syncope 06/21/2013  . Type 2 diabetes, controlled, with neuropathy (HCC) 10/18/2013  . Unspecified constipation 10/18/2013  . Vitamin D deficiency 04/11/2016    Past Surgical History:  Procedure Laterality Date  . NO PAST SURGERIES      Allergies as of 02/14/2017      Reactions   Tequin [gatifloxacin]       Medication List       Accurate as of 02/14/17 11:59 PM. Always use your most recent med list.          acetaminophen 325 MG tablet Commonly known as:  TYLENOL Take 650 mg by mouth every 8 (eight) hours as needed.   acetaminophen 500 MG tablet Commonly known as:  TYLENOL Take 1,000 mg by mouth 3 (three) times daily.   ARTIFICIAL TEARS OP Apply to eye. One drop to each eye four times a day for dry eyes   aspirin 81 MG chewable tablet Chew 1 tablet (81 mg total) by mouth daily.   ferrous sulfate 325 (65 FE) MG tablet Take 325 mg by mouth daily with breakfast.   furosemide 40 MG tablet Commonly known as:  LASIX Take 40 mg by mouth every morning.   gabapentin 300 MG capsule Commonly known as:  NEURONTIN Take 1 capsule (300 mg total) by mouth at bedtime.   ipratropium-albuterol 0.5-2.5 (  3) MG/3ML Soln Commonly known as:  DUONEB Take 3 mLs by nebulization every 6 (six) hours as needed.   lidocaine 5 % Commonly known as:  LIDODERM Place 1 patch onto the skin daily. Apply to right knee and lumbar Remove & Discard patch within 12 hours or as directed by MD   losartan 25 MG tablet Commonly known as:  COZAAR Take 25 mg by mouth every morning. For HTN   metFORMIN 500 MG 24 hr tablet Commonly known as:  GLUCOPHAGE-XR Take 500 mg by mouth every evening. For diabetes   omeprazole 20 MG capsule Commonly known as:  PRILOSEC Take 20 mg  by mouth daily. For GERD   potassium chloride 10 MEQ tablet Commonly known as:  K-DUR Take 30 mEq by mouth daily. For potassium   traMADol 50 MG tablet Commonly known as:  ULTRAM Take 50 mg by mouth every 6 (six) hours as needed.   traMADol-acetaminophen 37.5-325 MG tablet Commonly known as:  ULTRACET Take 2 tablets by mouth every 6 (six) hours as needed for severe pain.       No orders of the defined types were placed in this encounter.   Immunization History  Administered Date(s) Administered  . Influenza-Unspecified 03/23/2015, 04/06/2016  . PPD Test 08/12/2013  . Pneumococcal Polysaccharide-23 06/23/2013    Social History  Substance Use Topics  . Smoking status: Former Games developer  . Smokeless tobacco: Never Used  . Alcohol use No    Review of Systems  DATA OBTAINED: from patient- limited with language;Nurse-as per history of present illness GENERAL:  no fevers, fatigue, appetite changes SKIN: No itching, rash HEENT: No complaint RESPIRATORY: No cough, wheezing, SOB CARDIAC: No chest pain, palpitations, lower extremity edema  GI: No abdominal pain, No N/V/D or constipation, No heartburn or reflux  GU: No dysuria, frequency or urgency, or incontinence  MUSCULOSKELETAL: No unrelieved bone/joint pain NEUROLOGIC: No headache, dizziness  PSYCHIATRIC: No overt anxiety or sadness  Vitals:   02/16/17 1453  BP: 125/83  Pulse: 73  Resp: 18  Temp: (!) 97.1 F (36.2 C)   Body mass index is 22.42 kg/m. Physical Exam  GENERAL APPEARANCE: Alert, conversant, No acute distress  SKIN: No diaphoresis rash HEENT: Unremarkable RESPIRATORY: Breathing is even, unlabored. Lung sounds are clear   CARDIOVASCULAR: Heart RRR no murmurs, rubs or gallops. No peripheral edema  GASTROINTESTINAL: Abdomen is soft, non-tender, not distended w/ normal bowel sounds.  GENITOURINARY: Bladder non tender, not distended  MUSCULOSKELETAL: No abnormal joints or musculature NEUROLOGIC: Cranial  nerves 2-12 grossly intact. Moves all extremities PSYCHIATRIC: Mood and affect appropriate to situation, per nursing has been more moody, has been throwing things  Patient Active Problem List   Diagnosis Date Noted  . Lung nodule < 6cm on CT 11/13/2016  . Vitamin D deficiency 04/11/2016  . Hyperlipidemia 02/11/2016  . Pain 11/07/2015  . Wheezing 11/07/2015  . Edema 10/25/2015  . Conjunctivitis 07/09/2015  . UTI (urinary tract infection) 03/24/2015  . GERD (gastroesophageal reflux disease) 12/17/2014  . Hypokalemia 12/17/2014  . Depression 11/25/2014  . Iron deficiency anemia 09/03/2014  . Chronic renal disease, stage 2, mildly decreased glomerular filtration rate (GFR) between 60-89 mL/min/1.73 square meter 09/03/2014  . Pain in joint, lower leg 08/21/2014  . Cough 01/27/2014  . Dizziness 01/12/2014  . Contusion of unspecified site 01/12/2014  . Breast mass, right 12/13/2013  . Lumbago 10/23/2013  . Type 2 diabetes, controlled, with neuropathy (HCC) 10/18/2013  . Peripheral sensory neuropathy due to type 2  diabetes mellitus (HCC) 10/18/2013  . Unspecified constipation 10/18/2013  . Plantar fasciitis, left 07/29/2013  . Physical deconditioning 07/21/2013  . Diabetes mellitus, controlled (HCC) 07/19/2013  . Chronic diastolic CHF (congestive heart failure) (HCC) 07/19/2013  . HCAP (healthcare-associated pneumonia) 07/18/2013  . Chronic diastolic heart failure (HCC) 06/24/2013  . Syncope 06/21/2013  . Syncope and collapse 06/21/2013  . Leukocytosis 06/21/2013  . Hypertensive heart disease with congestive heart failure (HCC) 06/21/2013  . Diabetes mellitus due to underlying condition (HCC) 06/21/2013    CMP     Component Value Date/Time   NA 145 01/16/2017   K 3.6 01/16/2017   CL 106 10/09/2016 1425   CO2 26 10/09/2016 1425   GLUCOSE 97 10/09/2016 1425   BUN 19 01/16/2017   CREATININE 0.9 01/16/2017   CREATININE 0.82 10/09/2016 1425   CALCIUM 8.8 (L) 10/09/2016 1425    PROT 6.4 (L) 10/09/2016 1425   ALBUMIN 3.4 (L) 10/09/2016 1425   AST 18 01/16/2017   ALT 9 01/16/2017   ALKPHOS 85 01/16/2017   BILITOT 0.5 10/09/2016 1425   GFRNONAA >60 10/09/2016 1425   GFRAA >60 10/09/2016 1425    Recent Labs  03/30/16 10/09/16 10/09/16 1425 01/16/17  NA 142 141 141 145  K 4.2  --  4.6 3.6  CL  --   --  106  --   CO2  --   --  26  --   GLUCOSE  --   --  97  --   BUN 10 13 13 19   CREATININE 0.8 0.8 0.82 0.9  CALCIUM  --   --  8.8*  --     Recent Labs  03/30/16 10/09/16 1425 01/16/17  AST 21 19 18   ALT 13 12* 9  ALKPHOS 74 73 85  BILITOT  --  0.5  --   PROT  --  6.4*  --   ALBUMIN  --  3.4*  --     Recent Labs  03/25/16 10/09/16 10/09/16 1425 01/16/17  WBC 5.2 6.6 6.6 5.9  NEUTROABS  --   --  5.0  --   HGB 12.4  --  11.7* 12.2  HCT 37  --  36.1 37  MCV  --   --  91.2  --   PLT 177  --  156 180    Recent Labs  05/17/16 01/16/17  CHOL 205* 166  LDLCALC 131 97  TRIG 119 100   No results found for: MICROALBUR Lab Results  Component Value Date   TSH 0.17 (A) 01/16/2017   Lab Results  Component Value Date   HGBA1C 5.7 01/16/2017   Lab Results  Component Value Date   CHOL 166 01/16/2017   HDL 49 01/16/2017   LDLCALC 97 01/16/2017   TRIG 100 01/16/2017   CHOLHDL 3.2 06/22/2013    Significant Diagnostic Results in last 30 days:  No results found.  Assessment and Plan  BEHAVIORAL CHANGE-have ordered CMP and CBC; also looking through her chart noted that patient's last TSH was 0.17*also ordered a repeat TSH free T3 and T4 along with a UA; we will monitor results; we will monitor her behaviors    Merrilee Seashore, MD

## 2017-02-15 DIAGNOSIS — E119 Type 2 diabetes mellitus without complications: Secondary | ICD-10-CM | POA: Diagnosis not present

## 2017-02-15 DIAGNOSIS — I10 Essential (primary) hypertension: Secondary | ICD-10-CM | POA: Diagnosis not present

## 2017-02-15 DIAGNOSIS — N390 Urinary tract infection, site not specified: Secondary | ICD-10-CM | POA: Diagnosis not present

## 2017-02-15 DIAGNOSIS — E039 Hypothyroidism, unspecified: Secondary | ICD-10-CM | POA: Diagnosis not present

## 2017-02-15 DIAGNOSIS — E559 Vitamin D deficiency, unspecified: Secondary | ICD-10-CM | POA: Diagnosis not present

## 2017-02-15 LAB — CBC AND DIFFERENTIAL
HEMATOCRIT: 43 (ref 36–46)
Hemoglobin: 14.1 (ref 12.0–16.0)
PLATELETS: 239 (ref 150–399)
WBC: 4.6

## 2017-02-15 LAB — HEPATIC FUNCTION PANEL
ALT: 15 (ref 7–35)
AST: 27 (ref 13–35)
Alkaline Phosphatase: 112 (ref 25–125)
Bilirubin, Total: 0.6

## 2017-02-15 LAB — BASIC METABOLIC PANEL
BUN: 16 (ref 4–21)
Creatinine: 1 (ref 0.5–1.1)
GLUCOSE: 121
Potassium: 4.4 (ref 3.4–5.3)
SODIUM: 144 (ref 137–147)

## 2017-02-15 LAB — TSH: TSH: 0.39 — AB (ref 0.41–5.90)

## 2017-02-16 ENCOUNTER — Encounter: Payer: Self-pay | Admitting: Internal Medicine

## 2017-02-16 DIAGNOSIS — Z79899 Other long term (current) drug therapy: Secondary | ICD-10-CM | POA: Diagnosis not present

## 2017-02-16 DIAGNOSIS — E785 Hyperlipidemia, unspecified: Secondary | ICD-10-CM | POA: Diagnosis not present

## 2017-02-16 DIAGNOSIS — E119 Type 2 diabetes mellitus without complications: Secondary | ICD-10-CM | POA: Diagnosis not present

## 2017-02-16 DIAGNOSIS — N390 Urinary tract infection, site not specified: Secondary | ICD-10-CM | POA: Diagnosis not present

## 2017-02-16 DIAGNOSIS — E559 Vitamin D deficiency, unspecified: Secondary | ICD-10-CM | POA: Diagnosis not present

## 2017-02-16 DIAGNOSIS — R319 Hematuria, unspecified: Secondary | ICD-10-CM | POA: Diagnosis not present

## 2017-02-17 ENCOUNTER — Non-Acute Institutional Stay (SKILLED_NURSING_FACILITY): Payer: Medicare Other | Admitting: Internal Medicine

## 2017-02-17 ENCOUNTER — Encounter: Payer: Self-pay | Admitting: Internal Medicine

## 2017-02-17 DIAGNOSIS — D508 Other iron deficiency anemias: Secondary | ICD-10-CM | POA: Diagnosis not present

## 2017-02-17 DIAGNOSIS — E785 Hyperlipidemia, unspecified: Secondary | ICD-10-CM

## 2017-02-17 DIAGNOSIS — E559 Vitamin D deficiency, unspecified: Secondary | ICD-10-CM

## 2017-02-17 NOTE — Progress Notes (Signed)
Location:  [facility] Room Number: 205D Place of Service:  SNF (31)  Margaret Hanks, MD  Patient Care Team: Margaret Hanks, MD as PCP - General (Internal Medicine)  Extended Emergency Contact Information Primary Emergency Contact: [name],[name] Address: [address]          [place], [place] [address] [name] of [place] Home Phone: [phone] Mobile Phone: [phone] Relation: Sister Secondary Emergency Contact: [name],[name] Address: [address]          [place], [place] [address] [name] of [place] Home Phone: [phone] Relation: Grandaughter    Allergies: Tequin [gatifloxacin]  Chief Complaint  Patient presents with  . Medical Management of Chronic Issues    routine visit    HPI: Patient is 83 y.o. female who Is being seen for routine issues of vitamin D deficiency, iron deficiency anemia, and hyperlipidemia.   Past Medical History:  Diagnosis Date  . Breast mass, right 12/13/2013  . Chronic diastolic CHF (congestive heart failure) (HCC) 07/19/2013  . Chronic diastolic heart failure (HCC)   . Chronic kidney disease, stage II (mild)   . Chronic renal disease, stage 2, mildly decreased glomerular filtration rate (GFR) between 60-89 mL/min/1.73 square meter 09/03/2014  . Closed T12 fracture (HCC) 07/16/2013  . Depression 07/18/2013  . Diabetes mellitus   . Diabetes mellitus due to underlying condition (HCC) 06/21/2013  . Diabetes mellitus without complication (HCC)   . Dysphagia, oropharyngeal phase   . GERD (gastroesophageal reflux disease) 12/17/2014  . Gout   . Hypertension   . Hypertensive heart disease with congestive heart failure (HCC) 06/21/2013  . Iron deficiency anemia 09/03/2014  . Iron deficiency anemia, unspecified   . Leukocytosis 07/18/2013  . Lumbago 10/23/2013  . Lung nodule < 6cm on CT 11/13/2016  . Osteopenia 07/16/2013  . Syncope 06/21/2013  . Type 2 diabetes, controlled, with neuropathy  (HCC) 10/18/2013  . Unspecified constipation 10/18/2013  . Vitamin D deficiency 04/11/2016    Past Surgical History:  Procedure Laterality Date  . NO PAST SURGERIES      Allergies as of 02/17/2017      Reactions   Tequin [gatifloxacin]       Medication List       Accurate as of 02/17/17 11:59 PM. Always use your most recent med list.          acetaminophen 325 MG tablet Commonly known as:  TYLENOL Take 650 mg by mouth every 8 (eight) hours as needed.   acetaminophen 500 MG tablet Commonly known as:  TYLENOL Take 1,000 mg by mouth 3 (three) times daily.   ARTIFICIAL TEARS OP Apply to eye. One drop to each eye four times a day for dry eyes   aspirin 81 MG chewable tablet Chew 1 tablet (81 mg total) by mouth daily.   ferrous sulfate 325 (65 FE) MG tablet Take 325 mg by mouth daily with breakfast.   furosemide 40 MG tablet Commonly known as:  LASIX Take 40 mg by mouth every morning.   gabapentin 300 MG capsule Commonly known as:  NEURONTIN Take 1 capsule (300 mg total) by mouth at bedtime.   ipratropium-albuterol 0.5-2.5 (3) MG/3ML Soln Commonly known as:  DUONEB Take 3 mLs by nebulization every 6 (six) hours as needed.   lidocaine 5 % Commonly known as:  LIDODERM Place 1 patch onto the skin daily. Apply to right knee and lumbar Remove & Discard patch within 12 hours or as directed by  MD   losartan 25 MG tablet Commonly known as:  COZAAR Take 25 mg by mouth every morning. For HTN   metFORMIN 500 MG 24 hr tablet Commonly known as:  GLUCOPHAGE-XR Take 500 mg by mouth every evening. For diabetes   omeprazole 20 MG capsule Commonly known as:  PRILOSEC Take 20 mg by mouth daily. For GERD   potassium chloride 10 MEQ tablet Commonly known as:  K-DUR Take 30 mEq by mouth daily. For potassium   traMADol 50 MG tablet Commonly known as:  ULTRAM Take 50 mg by mouth. Take one tablet twice daily as needed for pain   traMADol-acetaminophen 37.5-325 MG  tablet Commonly known as:  ULTRACET Take 2 tablets by mouth. Take two tablets every 6 hours as needed for severe pain   Vitamin D (Ergocalciferol) 50000 units Caps capsule Commonly known as:  DRISDOL Take 50,000 Units by mouth. Take one tablet weekly for 12 weeks            Discharge Care Instructions        Start     Ordered   02/17/17 0000  CBC and differential    Comments:  This external order was created through the Results Console.    02/17/17 1200   02/17/17 0000  Basic metabolic panel    Comments:  This external order was created through the Results Console.    02/17/17 1200   02/17/17 0000  Hepatic function panel    Comments:  This external order was created through the Results Console.    02/17/17 1200   02/17/17 0000  TSH    Comments:  This external order was created through the Results Console.    02/17/17 1200      Meds ordered this encounter  Medications  . Vitamin D, Ergocalciferol, (DRISDOL) 50000 units CAPS capsule    Sig: Take 50,000 Units by mouth. Take one tablet weekly for 12 weeks    Immunization History  Administered Date(s) Administered  . Influenza-Unspecified 03/23/2015, 04/06/2016  . PPD Test 08/12/2013  . Pneumococcal Polysaccharide-23 06/23/2013    Social History  Substance Use Topics  . Smoking status: Former Games developer  . Smokeless tobacco: Never Used  . Alcohol use No    Review of Systems  DATA OBTAINED: from patient, nurse GENERAL:  no fevers, fatigue, appetite changes SKIN: No itching, rash HEENT: No complaint RESPIRATORY: No cough, wheezing, SOB CARDIAC: No chest pain, palpitations, lower extremity edema  GI: No abdominal pain, No N/V/D or constipation, No heartburn or reflux  GU: No dysuria, frequency or urgency, or incontinence  MUSCULOSKELETAL: No unrelieved bone/joint pain NEUROLOGIC: No headache, dizziness  PSYCHIATRIC: No overt anxiety or sadness  Vitals:   02/17/17 1146  BP: 125/83  Pulse: 73  Resp: 18   Temp: (!) 97.1 F (36.2 C)   Body mass index is 22.42 kg/m. Physical Exam  GENERAL APPEARANCE: Alert, No acute distress  SKIN: No diaphoresis rash HEENT: Unremarkable RESPIRATORY: Breathing is even, unlabored. Lung sounds are clear   CARDIOVASCULAR: Heart RRR no murmurs, rubs or gallops. No peripheral edema  GASTROINTESTINAL: Abdomen is soft, non-tender, not distended w/ normal bowel sounds.  GENITOURINARY: Bladder non tender, not distended  MUSCULOSKELETAL: No abnormal joints or musculature NEUROLOGIC: Cranial nerves 2-12 grossly intact. Moves all extremities PSYCHIATRIC: Mood and affect appropriate to situation, no behavioral issues  Patient Active Problem List   Diagnosis Date Noted  . Lung nodule < 6cm on CT 11/13/2016  . Vitamin D deficiency 04/11/2016  .  Hyperlipidemia 02/11/2016  . Pain 11/07/2015  . Wheezing 11/07/2015  . Edema 10/25/2015  . Conjunctivitis 07/09/2015  . UTI (urinary tract infection) 03/24/2015  . GERD (gastroesophageal reflux disease) 12/17/2014  . Hypokalemia 12/17/2014  . Depression 11/25/2014  . Iron deficiency anemia 09/03/2014  . Chronic renal disease, stage 2, mildly decreased glomerular filtration rate (GFR) between 60-89 mL/min/1.73 square meter 09/03/2014  . Pain in joint, lower leg 08/21/2014  . Cough 01/27/2014  . Dizziness 01/12/2014  . Contusion of unspecified site 01/12/2014  . Breast mass, right 12/13/2013  . Lumbago 10/23/2013  . Type 2 diabetes, controlled, with neuropathy (HCC) 10/18/2013  . 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 Unspecified constipation 10/18/2013  . Plantar fasciitis, left 07/29/2013  . Physical deconditioning 07/21/2013  . Diabetes mellitus, controlled (HCC) 07/19/2013  . Chronic diastolic CHF (congestive heart failure) (HCC) 07/19/2013  . HCAP (healthcare-associated pneumonia) 07/18/2013  . Chronic diastolic heart failure (HCC) 06/24/2013  . Syncope 06/21/2013   . Syncope and collapse 06/21/2013  . Leukocytosis 06/21/2013  . Hypertensive heart disease with congestive heart failure (HCC) 06/21/2013  . Diabetes mellitus due to underlying condition (HCC) 06/21/2013    CMP     Component Value Date/Time   NA 144 02/15/2017   K 4.4 02/15/2017   CL 106 10/09/2016 1425   CO2 26 10/09/2016 1425   GLUCOSE 97 10/09/2016 1425   BUN 16 02/15/2017   CREATININE 1.0 02/15/2017   CREATININE 0.82 10/09/2016 1425   CALCIUM 8.8 (L) 10/09/2016 1425   PROT 6.4 (L) 10/09/2016 1425   ALBUMIN 3.4 (L) 10/09/2016 1425   AST 27 02/15/2017   ALT 15 02/15/2017   ALKPHOS 112 02/15/2017   BILITOT 0.5 10/09/2016 1425   GFRNONAA >60 10/09/2016 1425   GFRAA >60 10/09/2016 1425    Recent Labs  10/09/16 1425 01/16/17 02/15/17  NA 141 145 144  K 4.6 3.6 4.4  CL 106  --   --   CO2 26  --   --   GLUCOSE 97  --   --   BUN 13 19 16   CREATININE 0.82 0.9 1.0  CALCIUM 8.8*  --   --     Recent Labs  10/09/16 1425 01/16/17 02/15/17  AST 19 18 27   ALT 12* 9 15  ALKPHOS 73 85 112  BILITOT 0.5  --   --   PROT 6.4*  --   --   ALBUMIN 3.4*  --   --         Recent Labs  10/09/16 1425 01/16/17 02/15/17  WBC 6.6 5.9 4.6  NEUTROABS 5.0  --   --   HGB 11.7* 12.2 14.1  HCT 36.1 37 43  MCV 91.2  --   --   PLT 156 180 239    Recent Labs  05/17/16 01/16/17  CHOL 205* 166  LDLCALC 131 97  TRIG 119 100   No results found for: MICROALBUR Lab Results  Component Value Date   TSH 0.39 (A) 02/15/2017         Lab Results  Component Value Date   HGBA1C 5.7 01/16/2017   Lab Results  Component Value Date   CHOL 166 01/16/2017   HDL 49 01/16/2017   LDLCALC 97 01/16/2017   TRIG 100 01/16/2017   CHOLHDL 3.2 06/22/2013    Significant Diagnostic Results in last 30 days:  No results found.  Assessment and Plan  Vitamin D deficiency Most recent level 27.2;  plan to continue replacement 50,000 units weekly  Iron deficiency anemia Recent hemoglobin 12.2 up  from 11.7; plan to continue iron 325 daily  Hyperlipidemia LDL 97, HDL 49 very good for her age and with A1c at 5.7 do not again recommend statins at this time   Margaret [name] M.D.                                                                                                                                                                     Randon Goldsmith. Lyn Hollingshead, MD

## 2017-02-18 DIAGNOSIS — I5032 Chronic diastolic (congestive) heart failure: Secondary | ICD-10-CM | POA: Diagnosis not present

## 2017-02-18 DIAGNOSIS — M6281 Muscle weakness (generalized): Secondary | ICD-10-CM | POA: Diagnosis not present

## 2017-02-18 DIAGNOSIS — D509 Iron deficiency anemia, unspecified: Secondary | ICD-10-CM | POA: Diagnosis not present

## 2017-02-18 DIAGNOSIS — R488 Other symbolic dysfunctions: Secondary | ICD-10-CM | POA: Diagnosis not present

## 2017-02-20 ENCOUNTER — Encounter: Payer: Self-pay | Admitting: Internal Medicine

## 2017-02-20 ENCOUNTER — Non-Acute Institutional Stay (SKILLED_NURSING_FACILITY): Payer: Medicare Other | Admitting: Internal Medicine

## 2017-02-20 DIAGNOSIS — N390 Urinary tract infection, site not specified: Secondary | ICD-10-CM | POA: Diagnosis not present

## 2017-02-20 DIAGNOSIS — B9620 Unspecified Escherichia coli [E. coli] as the cause of diseases classified elsewhere: Secondary | ICD-10-CM | POA: Diagnosis not present

## 2017-02-20 NOTE — Progress Notes (Signed)
Location:  [facility]   Place of Service:  SNF (31[facility]illed nursing facility  [name], MD  [name], MD as PCP - General (Internal Medicine)  Extended Emergency Contact Information Primary Emergency Contact: Giddings,[name] Address: [address]          [place], [place] [address] [name] of [place] Home Phone: [phone] Mobile Phone: [phone] Relation: Sister Secondary Emergency Contact: [name],[name] Address: [address]          [place], [place] [address] [name] of [place] Home Phone: [phone] Relation: Grandaughter    nn Allergies: Tequin [gatifloxacin]  Chief Complaint  Patient presents with  . Acute Visit    for UTI    HPI: Patient is 83 y.o. female who Is being seen acutely for a UTI. Several days ago patient had been complaining of lower abdominal pain/pelvic pain and was also "acting out", throwing things around her room which is very in characteristic for her. Today patient's urine culture with sensitivities returned with greater than 100,000 of Escherichia coli which is pansensitive. Patient has had no fever, a few episodic mental status changes and still with lower abdominal pain.  Past Medical History:  Diagnosis Date  . Breast mass, right 12/13/2013  . Chronic diastolic CHF (congestive heart failure) (HCC) 07/19/2013  . Chronic diastolic heart failure (HCC)   . Chronic kidney disease, stage II (mild)   . Chronic renal disease, stage 2, mildly decreased glomerular filtration rate (GFR) between 60-89 mL/min/1.73 square meter 09/03/2014  . Closed T12 fracture (HCC) 07/16/2013  . Depression 07/18/2013  . Diabetes mellitus   . Diabetes mellitus due to underlying condition (HCC) 06/21/2013  . Diabetes mellitus without complication (HCC)   . Dysphagia, oropharyngeal phase   . GERD (gastroesophageal reflux disease) 12/17/2014  . Gout   . Hypertension   . Hypertensive heart  disease with congestive heart failure (HCC) 06/21/2013  . Iron deficiency anemia 09/03/2014  . Iron deficiency anemia, unspecified   . Leukocytosis 07/18/2013  . Lumbago 10/23/2013  . Lung nodule < 6cm on CT 11/13/2016  . Osteopenia 07/16/2013  . Syncope 06/21/2013  . Type 2 diabetes, controlled, with neuropathy (HCC) 10/18/2013  . Unspecified constipation 10/18/2013  . Vitamin D deficiency 04/11/2016    Past Surgical History:  Procedure Laterality Date  . NO PAST SURGERIES      Allergies as of 02/20/2017      Reactions   Tequin [gatifloxacin]       Medication List       Accurate as of 02/20/17 11:59 PM. Always use your most recent med list.          acetaminophen 325 MG tablet Commonly known as:  TYLENOL Take 650 mg by mouth every 8 (eight) hours as needed.   acetaminophen 500 MG tablet Commonly known as:  TYLENOL Take 1,000 mg by mouth 3 (three) times daily.   ARTIFICIAL TEARS OP Apply to eye. One drop to each eye four times a day for dry eyes   aspirin 81 MG chewable tablet Chew 1 tablet (81 mg total) by mouth daily.   ferrous sulfate 325 (65 FE) MG tablet Take 325 mg by mouth daily with breakfast.   furosemide 40 MG tablet Commonly known as:  LASIX Take 40 mg by mouth every morning.   gabapentin 300 MG capsule Commonly known as:  NEURONTIN Take 1 capsule (300 mg total) by mouth at bedtime.   ipratropium-albuterol 0.5-2.5 (3)  MG/3ML Soln Commonly known as:  DUONEB Take 3 mLs by nebulization every 6 (six) hours as needed.   lidocaine 5 % Commonly known as:  LIDODERM Place 1 patch onto the skin daily. Apply to right knee and lumbar Remove & Discard patch within 12 hours or as directed by MD   losartan 25 MG tablet Commonly known as:  COZAAR Take 25 mg by mouth every morning. For HTN   metFORMIN 500 MG 24 hr tablet Commonly known as:  GLUCOPHAGE-XR Take 500 mg by mouth every evening. For diabetes   omeprazole 20 MG capsule Commonly known as:   PRILOSEC Take 20 mg by mouth daily. For GERD   potassium chloride 10 MEQ tablet Commonly known as:  K-DUR Take 30 mEq by mouth daily. For potassium   traMADol 50 MG tablet Commonly known as:  ULTRAM Take 50 mg by mouth. Take one tablet twice daily as needed for pain   traMADol-acetaminophen 37.5-325 MG tablet Commonly known as:  ULTRACET Take 2 tablets by mouth. Take two tablets every 6 hours as needed for severe pain   Vitamin D (Ergocalciferol) 50000 units Caps capsule Commonly known as:  DRISDOL Take 50,000 Units by mouth. Take one tablet weekly for 12 weeks       No orders of the defined types were placed in this encounter.   Immunization History  Administered Date(s) Administered  . Influenza-Unspecified 03/23/2015, 04/06/2016  . PPD Test 08/12/2013  . Pneumococcal Polysaccharide-23 06/23/2013    Social History  Substance Use Topics  . Smoking status: Former Games developer  . Smokeless tobacco: Never Used  . Alcohol use No    Review of Systems  DATA OBTAINED: from Nurse-as per history of present illness GENERAL:  no fevers, fatigue, appetite changes SKIN: No itching, rash HEENT: No complaint RESPIRATORY: No cough, wheezing, SOB CARDIAC: No chest pain, palpitations, lower extremity edema  GI: No abdominal pain, No N/V/D or constipation, No heartburn or reflux  GU: No dysuria, frequency or urgency, or incontinence  MUSCULOSKELETAL: No unrelieved bone/joint pain NEUROLOGIC: No headache, dizziness  PSYCHIATRIC: No overt anxiety or sadness  Vitals:   02/20/17 1400  BP: 125/83  Pulse: 74  Resp: 16  Temp: (!) 97.1 F (36.2 C)   Body mass index is 22.29 kg/m. Physical Exam  GENERAL APPEARANCE: Alert,  No acute distress  SKIN: No diaphoresis rash HEENT: Unremarkable RESPIRATORY: Breathing is even, unlabored. Lung sounds are clear   CARDIOVASCULAR: Heart RRR no murmurs, rubs or gallops. No peripheral edema  GASTROINTESTINAL: Abdomen is soft, non-tender, not  distended w/ normal bowel sounds.  GENITOURINARY: Bladder mild tender, not distended  MUSCULOSKELETAL: No abnormal joints or musculature NEUROLOGIC: Cranial nerves 2-12 grossly intact. Moves all extremities PSYCHIATRIC: Mood and affect appropriate to situation, no behavioral issues  Patient Active Problem List   Diagnosis Date Noted  . Lung nodule < 6cm on CT 11/13/2016  . Vitamin D deficiency 04/11/2016  . Hyperlipidemia 02/11/2016  . Pain 11/07/2015  . Wheezing 11/07/2015  . Edema 10/25/2015  . Conjunctivitis 07/09/2015  . UTI (urinary tract infection) 03/24/2015  . GERD (gastroesophageal reflux disease) 12/17/2014  . Hypokalemia 12/17/2014  . Depression 11/25/2014  . Iron deficiency anemia 09/03/2014  . Chronic renal disease, stage 2, mildly decreased glomerular filtration rate (GFR) between 60-89 mL/min/1.73 square meter 09/03/2014  . Pain in joint, lower leg 08/21/2014  . Cough 01/27/2014  . Dizziness 01/12/2014  . Contusion of unspecified site 01/12/2014  . Breast mass, right 12/13/2013  . Lumbago  10/23/2013  . Type 2 diabetes, controlled, with neuropathy (HCC) 10/18/2013  . 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 Unspecified constipation 10/18/2013  . Plantar fasciitis, left 07/29/2013  . Physical deconditioning 07/21/2013  . Diabetes mellitus, controlled (HCC) 07/19/2013  . Chronic diastolic CHF (congestive heart failure) (HCC) 07/19/2013  . HCAP (healthcare-associated pneumonia) 07/18/2013  . Chronic diastolic heart failure (HCC) 06/24/2013  . Syncope 06/21/2013  . Syncope and collapse 06/21/2013  . Leukocytosis 06/21/2013  . Hypertensive heart disease with congestive heart failure (HCC) 06/21/2013  . Diabetes mellitus due to underlying condition (HCC) 06/21/2013    CMP     Component Value Date/Time   NA 144 02/15/2017   K 4.4 02/15/2017   CL 106 10/09/2016 1425   CO2 26 10/09/2016 1425   GLUCOSE 97 10/09/2016 1425    BUN 16 02/15/2017   CREATININE 1.0 02/15/2017   CREATININE 0.82 10/09/2016 1425   CALCIUM 8.8 (L) 10/09/2016 1425   PROT 6.4 (L) 10/09/2016 1425   ALBUMIN 3.4 (L) 10/09/2016 1425   AST 27 02/15/2017   ALT 15 02/15/2017   ALKPHOS 112 02/15/2017   BILITOT 0.5 10/09/2016 1425   GFRNONAA >60 10/09/2016 1425   GFRAA >60 10/09/2016 1425    Recent Labs  10/09/16 1425 01/16/17 02/15/17  NA 141 145 144  K 4.6 3.6 4.4  CL 106  --   --   CO2 26  --   --   GLUCOSE 97  --   --   BUN 13 19 16   CREATININE 0.82 0.9 1.0  CALCIUM 8.8*  --   --     Recent Labs  10/09/16 1425 01/16/17 02/15/17  AST 19 18 27   ALT 12* 9 15  ALKPHOS 73 85 112  BILITOT 0.5  --   --   PROT 6.4*  --   --   ALBUMIN 3.4*  --   --     Recent Labs  10/09/16 1425 01/16/17 02/15/17  WBC 6.6 5.9 4.6  NEUTROABS 5.0  --   --   HGB 11.7* 12.2 14.1  HCT 36.1 37 43  MCV 91.2  --   --   PLT 156 180 239    Recent Labs  05/17/16 01/16/17  CHOL 205* 166  LDLCALC 131 97  TRIG 119 100   No results found for: MICROALBUR Lab Results  Component Value Date   TSH 0.39 (A) 02/15/2017   Lab Results  Component Value Date   HGBA1C 5.7 01/16/2017   Lab Results  Component Value Date   CHOL 166 01/16/2017   HDL 49 01/16/2017   LDLCALC 97 01/16/2017   TRIG 100 01/16/2017   CHOLHDL 3.2 06/22/2013    Significant Diagnostic Results in last 30 days:  No results found.  Assessment and Plan  E COLI UTI-greater than 100,000, pansensitive. Patient is allergic to Tequin. Patient's calculated creatinine clearance is 33.17; will treat with Omnicef 300 mg every 127 days; we will monitor response     Merrilee Seashore, MD

## 2017-02-21 ENCOUNTER — Encounter: Payer: Self-pay | Admitting: Internal Medicine

## 2017-02-23 DIAGNOSIS — F329 Major depressive disorder, single episode, unspecified: Secondary | ICD-10-CM | POA: Diagnosis not present

## 2017-02-23 DIAGNOSIS — F419 Anxiety disorder, unspecified: Secondary | ICD-10-CM | POA: Diagnosis not present

## 2017-02-23 DIAGNOSIS — G3184 Mild cognitive impairment, so stated: Secondary | ICD-10-CM | POA: Diagnosis not present

## 2017-03-11 ENCOUNTER — Encounter: Payer: Self-pay | Admitting: Internal Medicine

## 2017-03-11 NOTE — Assessment & Plan Note (Signed)
LDL 97, HDL 49 very good for her age and with A1c at 5.7 do not again recommend statins at this time

## 2017-03-11 NOTE — Assessment & Plan Note (Signed)
Most recent level 27.2; plan to continue replacement 50,000 units weekly

## 2017-03-11 NOTE — Assessment & Plan Note (Signed)
Recent hemoglobin 12.2 up from 11.7; plan to continue iron 325 daily

## 2017-03-22 ENCOUNTER — Encounter: Payer: Self-pay | Admitting: Internal Medicine

## 2017-03-22 ENCOUNTER — Non-Acute Institutional Stay (SKILLED_NURSING_FACILITY): Payer: Medicare Other | Admitting: Internal Medicine

## 2017-03-22 DIAGNOSIS — G3184 Mild cognitive impairment, so stated: Secondary | ICD-10-CM | POA: Diagnosis not present

## 2017-03-22 DIAGNOSIS — E1142 Type 2 diabetes mellitus with diabetic polyneuropathy: Secondary | ICD-10-CM | POA: Diagnosis not present

## 2017-03-22 DIAGNOSIS — K219 Gastro-esophageal reflux disease without esophagitis: Secondary | ICD-10-CM | POA: Diagnosis not present

## 2017-03-22 DIAGNOSIS — I5032 Chronic diastolic (congestive) heart failure: Secondary | ICD-10-CM | POA: Diagnosis not present

## 2017-03-22 DIAGNOSIS — F419 Anxiety disorder, unspecified: Secondary | ICD-10-CM | POA: Diagnosis not present

## 2017-03-22 DIAGNOSIS — F331 Major depressive disorder, recurrent, moderate: Secondary | ICD-10-CM | POA: Diagnosis not present

## 2017-03-22 NOTE — Progress Notes (Signed)
Location:  [facility] Room Number: 201 Place of Service:  SNF (31)  [name], MD  [name], MD as PCP - General (Internal Medicine)  Extended Emergency Contact Information Primary Emergency Contact: Milkovich,[name] Address: [address]          [place], [place] [address] [name] of [place] Home Phone: [phone] Mobile Phone: [phone] Relation: Sister Secondary Emergency Contact: [name],[name] Address: [address]          [place], [place] [address] [name] of [place] Home Phone: [phone] Relation: Grandaughter    Allergies: Tequin [gatifloxacin]  Chief Complaint  Patient presents with  . Medical Management of Chronic Issues    routine visit    HPI: Patient is 83 y.o. female who is being seen for routine issues of diabetes mellitus 2, chronic diastolic congestive heart failure, and GERD.  Past Medical History:  Diagnosis Date  . Breast mass, right 12/13/2013  . Chronic diastolic CHF (congestive heart failure) (HCC) 07/19/2013  . Chronic diastolic heart failure (HCC)   . Chronic kidney disease, stage II (mild)   . Chronic renal disease, stage 2, mildly decreased glomerular filtration rate (GFR) between 60-89 mL/min/1.73 square meter 09/03/2014  . Closed T12 fracture (HCC) 07/16/2013  . Depression 07/18/2013  . Diabetes mellitus   . Diabetes mellitus due to underlying condition (HCC) 06/21/2013  . Diabetes mellitus without complication (HCC)   . Dysphagia, oropharyngeal phase   . GERD (gastroesophageal reflux disease) 12/17/2014  . Gout   . Hypertension   . Hypertensive heart disease with congestive heart failure (HCC) 06/21/2013  . Iron deficiency anemia 09/03/2014  . Iron deficiency anemia, unspecified   . Leukocytosis 07/18/2013  . Lumbago 10/23/2013  . Lung nodule < 6cm on CT 11/13/2016  . Osteopenia 07/16/2013  . Syncope 06/21/2013  . Type 2 diabetes, controlled, with  neuropathy (HCC) 10/18/2013  . Unspecified constipation 10/18/2013  . Vitamin D deficiency 04/11/2016    Past Surgical History:  Procedure Laterality Date  . NO PAST SURGERIES      Allergies as of 03/22/2017      Reactions   Tequin [gatifloxacin]       Medication List        Accurate as of 03/22/17 11:59 PM. Always use your most recent med list.          acetaminophen 500 MG tablet Commonly known as:  TYLENOL Take 1,000 mg by mouth 3 (three) times daily.   ARTIFICIAL TEARS OP Apply to eye. One drop to each eye four times a day for dry eyes   aspirin 81 MG chewable tablet Chew 1 tablet (81 mg total) by mouth daily.   ferrous sulfate 325 (65 FE) MG tablet Take 325 mg by mouth daily with breakfast.   furosemide 40 MG tablet Commonly known as:  LASIX Take 40 mg by mouth every morning.   gabapentin 300 MG capsule Commonly known as:  NEURONTIN Take 1 capsule (300 mg total) by mouth at bedtime.   ipratropium-albuterol 0.5-2.5 (3) MG/3ML Soln Commonly known as:  DUONEB Take 3 mLs by nebulization every 6 (six) hours as needed.   lidocaine 5 % Commonly known as:  LIDODERM Place 1 patch onto the skin daily. Apply to right knee and lumbar Remove & Discard patch within 12 hours or as directed by MD   losartan 25 MG tablet Commonly known as:  COZAAR Take 25 mg by mouth every morning. For  HTN   metFORMIN 500 MG 24 hr tablet Commonly known as:  GLUCOPHAGE-XR Take 500 mg by mouth every evening. For diabetes   omeprazole 20 MG capsule Commonly known as:  PRILOSEC Take 20 mg by mouth daily. For GERD   potassium chloride 10 MEQ tablet Commonly known as:  K-DUR Take 30 mEq by mouth daily. For potassium   traMADol 50 MG tablet Commonly known as:  ULTRAM Take 50 mg by mouth. Take one tablet twice daily as needed for pain   traMADol-acetaminophen 37.5-325 MG tablet Commonly known as:  ULTRACET Take 2 tablets by mouth. Take two tablets every 6 hours as needed for severe  pain   Vitamin D (Ergocalciferol) 50000 units Caps capsule Commonly known as:  DRISDOL Take 50,000 Units by mouth. Take one tablet weekly for 12 weeks       No orders of the defined types were placed in this encounter.   Immunization History  Administered Date(s) Administered  . Influenza-Unspecified 03/23/2015, 04/06/2016, 04/06/2017  . PPD Test 08/12/2013  . Pneumococcal Polysaccharide-23 06/23/2013    Social History   Tobacco Use  . Smoking status: Former Games developer  . Smokeless tobacco: Never Used  Substance Use Topics  . Alcohol use: No    Review of Systems  DATA OBTAINED: from patient-limited; nurse-no concerns GENERAL:  no fevers, fatigue, appetite changes SKIN: No itching, rash HEENT: No complaint RESPIRATORY: No cough, wheezing, SOB CARDIAC: No chest pain, palpitations, lower extremity edema  GI: No abdominal pain, No N/V/D or constipation, No heartburn or reflux  GU: No dysuria, frequency or urgency, or incontinence  MUSCULOSKELETAL: No unrelieved bone/joint pain NEUROLOGIC: No headache, dizziness  PSYCHIATRIC: No overt anxiety or sadness  Vitals:   03/22/17 1001  BP: 132/64  Pulse: 71  Resp: 16  Temp: (!) 96.9 F (36.1 C)   Body mass index is 21.86 kg/m. Physical Exam  GENERAL APPEARANCE: Alert,  No acute distress ;in [place] in wheelchair daily SKIN: No diaphoresis rash HEENT: Unremarkable RESPIRATORY: Breathing is even, unlabored. Lung sounds are clear   CARDIOVASCULAR: Heart RRR no murmurs, rubs or gallops. No peripheral edema  GASTROINTESTINAL: Abdomen is soft, non-tender, not distended w/ normal bowel sounds.  GENITOURINARY: Bladder non tender, not distended  MUSCULOSKELETAL: No abnormal joints or musculature NEUROLOGIC: Cranial nerves 2-12 grossly intact. Moves all extremities PSYCHIATRIC: Mood and affect appropriate to situation, no behavioral issues  Patient Active Problem List   Diagnosis Date Noted  . Lung nodule < 6cm on CT 11/13/2016    . Vitamin D deficiency 04/11/2016  . Hyperlipidemia 02/11/2016  . Pain 11/07/2015  . Wheezing 11/07/2015  . Edema 10/25/2015  . Conjunctivitis 07/09/2015  . UTI (urinary tract infection) 03/24/2015  . GERD (gastroesophageal reflux disease) 12/17/2014  . Hypokalemia 12/17/2014  . Depression 11/25/2014  . Iron deficiency anemia 09/03/2014  . Chronic renal disease, stage 2, mildly decreased glomerular filtration rate (GFR) between 60-89 mL/min/1.73 square meter 09/03/2014  . Pain in joint, lower leg 08/21/2014  . Cough 01/27/2014  . Dizziness 01/12/2014  . Contusion of unspecified site 01/12/2014  . Breast mass, right 12/13/2013  . Lumbago 10/23/2013  . Type 2 diabetes, controlled, with neuropathy (HCC) 10/18/2013  . 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 Unspecified constipation 10/18/2013  . Plantar fasciitis, left 07/29/2013  . Physical deconditioning 07/21/2013  . Diabetes mellitus, controlled (HCC) 07/19/2013  . Chronic diastolic CHF (congestive heart failure) (HCC) 07/19/2013  . HCAP (healthcare-associated pneumonia) 07/18/2013  .  Chronic diastolic heart failure (HCC) 06/24/2013  . Syncope 06/21/2013  . Syncope and collapse 06/21/2013  . Leukocytosis 06/21/2013  . Hypertensive heart disease with congestive heart failure (HCC) 06/21/2013  . Diabetes mellitus due to underlying condition (HCC) 06/21/2013    CMP     Component Value Date/Time   NA 144 02/15/2017   K 4.4 02/15/2017   CL 106 10/09/2016 1425   CO2 26 10/09/2016 1425   GLUCOSE 97 10/09/2016 1425   BUN 16 02/15/2017   CREATININE 1.0 02/15/2017   CREATININE 0.82 10/09/2016 1425   CALCIUM 8.8 (L) 10/09/2016 1425   PROT 6.4 (L) 10/09/2016 1425   ALBUMIN 3.4 (L) 10/09/2016 1425   AST 27 02/15/2017   ALT 15 02/15/2017   ALKPHOS 112 02/15/2017   BILITOT 0.5 10/09/2016 1425   GFRNONAA >60 10/09/2016 1425   GFRAA >60 10/09/2016 1425   Recent Labs    10/09/16 1425  01/16/17 02/15/17  NA 141 145 144  K 4.6 3.6 4.4  CL 106  --   --   CO2 26  --   --   GLUCOSE 97  --   --   BUN CREATININE 0.82 0.9 1.0  CALCIUM 8.8*  --   --    Recent Labs    10/09/16 1425 01/16/17 02/15/17  AST ALT 12* 9 15  ALKPHOS 73 85 112  BILITOT 0.5  --   --   PROT 6.4*  --   --   ALBUMIN 3.4*  --   --    Recent Labs    10/09/16 1425 01/16/17 02/15/17  WBC 6.6 5.9 4.6  NEUTROABS 5.0  --   --   HGB 11.7* 12.2 14.1  HCT 36.1 37 43  MCV 91.2  --   --   PLT 156 180 239   Recent Labs    05/17/16 01/16/17  CHOL 205* 166  LDLCALC 131 97  TRIG 119 100   No results found for: MICROALBUR Lab Results  Component Value Date   TSH 0.39 (A) 02/15/2017   Lab Results  Component Value Date   HGBA1C 5.7 01/16/2017   Lab Results  Component Value Date   CHOL 166 01/16/2017   HDL 49 01/16/2017   LDLCALC 97 01/16/2017   TRIG 100 01/16/2017   CHOLHDL 3.2 06/22/2013    Significant Diagnostic Results in last 30 days:  No results found.  Assessment and Plan  Diabetes mellitus, controlled A1c is 5.7 very good control; continue Glucophage 24 hour 500 mg by mouth daily and cozaar 25 mg by mouth daily  Chronic diastolic CHF (congestive heart failure) hronic and stable without exacerbation;continue Cozaar 25 mg by mouth daily and Lasix 40 mg by mouth daily  GERD (gastroesophageal reflux disease) No reports of reflux; continue omeprazole 20 mg by mouth daily    Zuhayr Deeney D. Lyn Hollingshead, MD

## 2017-04-17 DIAGNOSIS — F331 Major depressive disorder, recurrent, moderate: Secondary | ICD-10-CM | POA: Diagnosis not present

## 2017-04-17 DIAGNOSIS — G3184 Mild cognitive impairment, so stated: Secondary | ICD-10-CM | POA: Diagnosis not present

## 2017-04-17 DIAGNOSIS — F419 Anxiety disorder, unspecified: Secondary | ICD-10-CM | POA: Diagnosis not present

## 2017-04-20 ENCOUNTER — Encounter: Payer: Self-pay | Admitting: Internal Medicine

## 2017-04-20 ENCOUNTER — Non-Acute Institutional Stay (SKILLED_NURSING_FACILITY): Payer: Medicare Other | Admitting: Internal Medicine

## 2017-04-20 DIAGNOSIS — E1142 Type 2 diabetes mellitus with diabetic polyneuropathy: Secondary | ICD-10-CM

## 2017-04-20 DIAGNOSIS — I5022 Chronic systolic (congestive) heart failure: Secondary | ICD-10-CM

## 2017-04-20 DIAGNOSIS — N182 Chronic kidney disease, stage 2 (mild): Secondary | ICD-10-CM | POA: Diagnosis not present

## 2017-04-20 DIAGNOSIS — I110 Hypertensive heart disease with heart failure: Secondary | ICD-10-CM | POA: Diagnosis not present

## 2017-04-20 NOTE — Progress Notes (Signed)
[facility] Room Number: 201D Place of Service:  SNF (31)  [name], Anne D, MD  Patient Care Team: [name], Anne D, MD as PCP - General (Internal Medicine)  Extended Emergency Contact Information Primary Emergency Contact: Moch,[name] Address: [address]          [place], [place] [address] [place] Home Phone: [phone] Mobile Phone: [phone] Relation: Sister Secondary Emergency Contact: [name],[name] Address: [address]          [place], [place] [address] [place] Home Phone: [phone] Relation: Grandaughter    Allergies: Tequin [gatifloxacin]  Chief Complaint  Patient presents with  . Medical Management of Chronic Issues    routine visit    HPI: Patient is 83 y.o. female who is being seen for routine issues of hypertension, diabetic neuropathy, and chronic kidney disease stage II.  Past Medical History:  Diagnosis Date  . Breast mass, right 12/13/2013  . Chronic diastolic CHF (congestive heart failure) (HCC) 07/19/2013  . Chronic diastolic heart failure (HCC)   . Chronic kidney disease, stage II (mild)   . Chronic renal disease, stage 2, mildly decreased glomerular filtration rate (GFR) between 60-89 mL/min/1.73 square meter 09/03/2014  . Closed T12 fracture (HCC) 07/16/2013  . Depression 07/18/2013  . Diabetes mellitus   . Diabetes mellitus due to underlying condition (HCC) 06/21/2013  . Diabetes mellitus without complication (HCC)   . Dysphagia, oropharyngeal phase   . GERD (gastroesophageal reflux disease) 12/17/2014  . Gout   . Hypertension   . Hypertensive heart disease with congestive heart failure (HCC) 06/21/2013  . Iron deficiency anemia 09/03/2014  . Iron deficiency anemia, unspecified   . Leukocytosis 07/18/2013  . Lumbago 10/23/2013  . Lung nodule < 6cm on CT 11/13/2016  . Osteopenia 07/16/2013  . Syncope 06/21/2013  . Type 2 diabetes, controlled, with  neuropathy (HCC) 10/18/2013  . Unspecified constipation 10/18/2013  . Vitamin D deficiency 04/11/2016    Past Surgical History:  Procedure Laterality Date  . NO PAST SURGERIES      Allergies as of 04/20/2017      Reactions   Tequin [gatifloxacin]       Medication List        Accurate as of 04/20/17 11:59 PM. Always use your most recent med list.          acetaminophen 325 MG tablet Commonly known as:  TYLENOL Take 325 mg by mouth. Take 2 tablets every 8 hours as needed for pain   acetaminophen 500 MG tablet Commonly known as:  TYLENOL Take 500 mg by mouth. Take 2 tablets (1000 mg) three times a day for pain   ARTIFICIAL TEARS OP Apply to eye. One drop to each eye four times a day for dry eyes   aspirin 81 MG chewable tablet Chew 1 tablet (81 mg total) by mouth daily.   ferrous sulfate 325 (65 FE) MG tablet Take 325 mg by mouth daily with breakfast.   furosemide 40 MG tablet Commonly known as:  LASIX Take 40 mg by mouth every morning.   gabapentin 300 MG capsule Commonly known as:  NEURONTIN Take 1 capsule (300 mg total) by mouth at bedtime.   ipratropium-albuterol 0.5-2.5 (3) MG/3ML Soln Commonly known as:  DUONEB Take 3 mLs by nebulization every 6 (six) hours as needed.   lidocaine 5 % Commonly known as:  LIDODERM Place 1 patch onto the skin daily. Apply to right knee and  lumbar Remove & Discard patch within 12 hours or as directed by MD   losartan 25 MG tablet Commonly known as:  COZAAR Take 25 mg by mouth every morning. For HTN   metFORMIN 500 MG 24 hr tablet Commonly known as:  GLUCOPHAGE-XR Take 500 mg by mouth every evening. For diabetes   omeprazole 20 MG capsule Commonly known as:  PRILOSEC Take 20 mg by mouth daily. For GERD   potassium chloride 10 MEQ tablet Commonly known as:  K-DUR Take 30 mEq by mouth daily. For potassium   traMADol 50 MG tablet Commonly known as:  ULTRAM Take 50 mg by mouth. Take one tablet twice daily as needed  for pain   traMADol-acetaminophen 37.5-325 MG tablet Commonly known as:  ULTRACET Take 2 tablets by mouth. Take two tablets every 6 hours as needed for severe pain   Vitamin D (Ergocalciferol) 50000 units Caps capsule Commonly known as:  DRISDOL Take 50,000 Units by mouth. Take one tablet weekly for 12 weeks       Meds ordered this encounter  Medications  . acetaminophen (TYLENOL) 325 MG tablet    Sig: Take 325 mg by mouth. Take 2 tablets every 8 hours as needed for pain  . acetaminophen (TYLENOL) 500 MG tablet    Sig: Take 500 mg by mouth. Take 2 tablets (1000 mg) three times a day for pain    Immunization History  Administered Date(s) Administered  . Influenza-Unspecified 03/23/2015, 04/06/2016, 04/06/2017  . PPD Test 08/12/2013  . Pneumococcal Polysaccharide-23 06/23/2013    Social History   Tobacco Use  . Smoking status: Former Games developer  . Smokeless tobacco: Never Used  Substance Use Topics  . Alcohol use: No    Review of Systems  DATA OBTAINED: from patient-limited; nursing-no concerns GENERAL:  no fevers, fatigue, appetite changes SKIN: No itching, rash HEENT: No complaint RESPIRATORY: No cough, wheezing, SOB CARDIAC: No chest pain, palpitations, lower extremity edema  GI: No abdominal pain, No N/V/D or constipation, No heartburn or reflux  GU: No dysuria, frequency or urgency, or incontinence  MUSCULOSKELETAL: No unrelieved bone/joint pain NEUROLOGIC: No headache, dizziness  PSYCHIATRIC: No overt anxiety or sadness  Vitals:   04/20/17 1404  BP: 124/60  Pulse: 72  Resp: 16  Temp: 98 F (36.7 C)   Body mass index is 21.55 kg/m. Physical Exam  GENERAL APPEARANCE: Alert,  No acute distress  SKIN: No diaphoresis rash HEENT: Unremarkable RESPIRATORY: Breathing is even, unlabored. Lung sounds are clear   CARDIOVASCULAR: Heart RRR no murmurs, rubs or gallops. No peripheral edema  GASTROINTESTINAL: Abdomen is soft, non-tender, not distended w/ normal  bowel sounds.  GENITOURINARY: Bladder non tender, not distended  MUSCULOSKELETAL: No abnormal joints or musculature NEUROLOGIC: Cranial nerves 2-12 grossly intact. Moves all extremities PSYCHIATRIC: Mood and affect appropriate to situation, no behavioral issues  Patient Active Problem List   Diagnosis Date Noted  . Lung nodule < 6cm on CT 11/13/2016  . Vitamin D deficiency 04/11/2016  . Hyperlipidemia 02/11/2016  . Pain 11/07/2015  . Wheezing 11/07/2015  . Edema 10/25/2015  . Conjunctivitis 07/09/2015  . UTI (urinary tract infection) 03/24/2015  . GERD (gastroesophageal reflux disease) 12/17/2014  . Hypokalemia 12/17/2014  . Depression 11/25/2014  . Iron deficiency anemia 09/03/2014  . Chronic renal disease, stage 2, mildly decreased glomerular filtration rate (GFR) between 60-89 mL/min/1.73 square meter 09/03/2014  . Pain in joint, lower leg 08/21/2014  . Cough 01/27/2014  . Dizziness 01/12/2014  . Contusion of unspecified site  01/12/2014  . Breast mass, right 12/13/2013  . Lumbago 10/23/2013  . Type 2 diabetes, controlled, with neuropathy (HCC) 10/18/2013  . 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 Unspecified constipation 10/18/2013  . Plantar fasciitis, left 07/29/2013  . Physical deconditioning 07/21/2013  . Diabetes mellitus, controlled (HCC) 07/19/2013  . Chronic diastolic CHF (congestive heart failure) (HCC) 07/19/2013  . HCAP (healthcare-associated pneumonia) 07/18/2013  . Chronic diastolic heart failure (HCC) 06/24/2013  . Syncope 06/21/2013  . Syncope and collapse 06/21/2013  . Leukocytosis 06/21/2013  . Hypertensive heart disease with congestive heart failure (HCC) 06/21/2013  . Diabetes mellitus due to underlying condition (HCC) 06/21/2013    CMP     Component Value Date/Time   NA 144 02/15/2017   K 4.4 02/15/2017   CL 106 10/09/2016 1425   CO2 26 10/09/2016 1425   GLUCOSE 97 10/09/2016 1425   BUN 16 02/15/2017     CREATININE 1.0 02/15/2017   CREATININE 0.82 10/09/2016 1425   CALCIUM 8.8 (L) 10/09/2016 1425   PROT 6.4 (L) 10/09/2016 1425   ALBUMIN 3.4 (L) 10/09/2016 1425   AST 27 02/15/2017   ALT 15 02/15/2017   ALKPHOS 112 02/15/2017   BILITOT 0.5 10/09/2016 1425   GFRNONAA >60 10/09/2016 1425   GFRAA >60 10/09/2016 1425   Recent Labs    10/09/16 1425 01/16/17 02/15/17  NA 141 145 144  K 4.6 3.6 4.4  CL 106  --   --   CO2 26  --   --   GLUCOSE 97  --   --   BUN 13 19 16   CREATININE 0.82 0.9 1.0  CALCIUM 8.8*  --   --    Recent Labs    10/09/16 1425 01/16/17 02/15/17  AST 19 18 27   ALT 12* 9 15  ALKPHOS 73 85 112  BILITOT 0.5  --   --   PROT 6.4*  --   --   ALBUMIN 3.4*  --   --    Recent Labs    10/09/16 1425 01/16/17 02/15/17  WBC 6.6 5.9 4.6  NEUTROABS 5.0  --   --   HGB 11.7* 12.2 14.1  HCT 36.1 37 43  MCV 91.2  --   --   PLT 156 180 239   Recent Labs    05/17/16 01/16/17  CHOL 205* 166  LDLCALC 131 97  TRIG 119 100   No results found for: MICROALBUR Lab Results  Component Value Date   TSH 0.39 (A) 02/15/2017   Lab Results  Component Value Date   HGBA1C 5.7 01/16/2017   Lab Results  Component Value Date   CHOL 166 01/16/2017   HDL 49 01/16/2017   LDLCALC 97 01/16/2017   TRIG 100 01/16/2017   CHOLHDL 3.2 06/22/2013    Significant Diagnostic Results in last 30 days:  No results found.  Assessment and Plan  Hypertensive heart disease with congestive heart failure Chronic and stable; continue Cozaar 25 mg by mouth daily and Lasix 40 mg by mouth daily  Peripheral sensory neuropathy due to type 2 diabetes mellitus No reports of pain; continue Neurontin 300 mg by mouth daily at bedtime  Chronic renal disease, stage 2, mildly decreased glomerular filtration rate (GFR) between 60-89 mL/min/1.73 square meter Stable; plan to monitor at intervals    Thurston Hole D. Lyn Hollingshead, MD

## 2017-04-28 DIAGNOSIS — M545 Low back pain: Secondary | ICD-10-CM | POA: Diagnosis not present

## 2017-04-28 DIAGNOSIS — I5032 Chronic diastolic (congestive) heart failure: Secondary | ICD-10-CM | POA: Diagnosis not present

## 2017-04-28 DIAGNOSIS — Z9181 History of falling: Secondary | ICD-10-CM | POA: Diagnosis not present

## 2017-04-28 DIAGNOSIS — M6281 Muscle weakness (generalized): Secondary | ICD-10-CM | POA: Diagnosis not present

## 2017-04-28 DIAGNOSIS — R1310 Dysphagia, unspecified: Secondary | ICD-10-CM | POA: Diagnosis not present

## 2017-04-28 DIAGNOSIS — R1312 Dysphagia, oropharyngeal phase: Secondary | ICD-10-CM | POA: Diagnosis not present

## 2017-04-29 DIAGNOSIS — R1312 Dysphagia, oropharyngeal phase: Secondary | ICD-10-CM | POA: Diagnosis not present

## 2017-04-29 DIAGNOSIS — R1310 Dysphagia, unspecified: Secondary | ICD-10-CM | POA: Diagnosis not present

## 2017-04-29 DIAGNOSIS — Z9181 History of falling: Secondary | ICD-10-CM | POA: Diagnosis not present

## 2017-04-29 DIAGNOSIS — M545 Low back pain: Secondary | ICD-10-CM | POA: Diagnosis not present

## 2017-04-29 DIAGNOSIS — I5032 Chronic diastolic (congestive) heart failure: Secondary | ICD-10-CM | POA: Diagnosis not present

## 2017-04-29 DIAGNOSIS — M6281 Muscle weakness (generalized): Secondary | ICD-10-CM | POA: Diagnosis not present

## 2017-05-01 DIAGNOSIS — I5032 Chronic diastolic (congestive) heart failure: Secondary | ICD-10-CM | POA: Diagnosis not present

## 2017-05-01 DIAGNOSIS — M545 Low back pain: Secondary | ICD-10-CM | POA: Diagnosis not present

## 2017-05-01 DIAGNOSIS — R1310 Dysphagia, unspecified: Secondary | ICD-10-CM | POA: Diagnosis not present

## 2017-05-01 DIAGNOSIS — M6281 Muscle weakness (generalized): Secondary | ICD-10-CM | POA: Diagnosis not present

## 2017-05-01 DIAGNOSIS — R1312 Dysphagia, oropharyngeal phase: Secondary | ICD-10-CM | POA: Diagnosis not present

## 2017-05-01 DIAGNOSIS — Z9181 History of falling: Secondary | ICD-10-CM | POA: Diagnosis not present

## 2017-05-02 DIAGNOSIS — M6281 Muscle weakness (generalized): Secondary | ICD-10-CM | POA: Diagnosis not present

## 2017-05-02 DIAGNOSIS — M545 Low back pain: Secondary | ICD-10-CM | POA: Diagnosis not present

## 2017-05-02 DIAGNOSIS — R1312 Dysphagia, oropharyngeal phase: Secondary | ICD-10-CM | POA: Diagnosis not present

## 2017-05-02 DIAGNOSIS — I5032 Chronic diastolic (congestive) heart failure: Secondary | ICD-10-CM | POA: Diagnosis not present

## 2017-05-02 DIAGNOSIS — Z9181 History of falling: Secondary | ICD-10-CM | POA: Diagnosis not present

## 2017-05-02 DIAGNOSIS — B351 Tinea unguium: Secondary | ICD-10-CM | POA: Diagnosis not present

## 2017-05-02 DIAGNOSIS — R262 Difficulty in walking, not elsewhere classified: Secondary | ICD-10-CM | POA: Diagnosis not present

## 2017-05-02 DIAGNOSIS — R1310 Dysphagia, unspecified: Secondary | ICD-10-CM | POA: Diagnosis not present

## 2017-05-03 DIAGNOSIS — I5032 Chronic diastolic (congestive) heart failure: Secondary | ICD-10-CM | POA: Diagnosis not present

## 2017-05-03 DIAGNOSIS — Z9181 History of falling: Secondary | ICD-10-CM | POA: Diagnosis not present

## 2017-05-03 DIAGNOSIS — M545 Low back pain: Secondary | ICD-10-CM | POA: Diagnosis not present

## 2017-05-03 DIAGNOSIS — R1310 Dysphagia, unspecified: Secondary | ICD-10-CM | POA: Diagnosis not present

## 2017-05-03 DIAGNOSIS — M6281 Muscle weakness (generalized): Secondary | ICD-10-CM | POA: Diagnosis not present

## 2017-05-03 DIAGNOSIS — R1312 Dysphagia, oropharyngeal phase: Secondary | ICD-10-CM | POA: Diagnosis not present

## 2017-05-04 DIAGNOSIS — Z9181 History of falling: Secondary | ICD-10-CM | POA: Diagnosis not present

## 2017-05-04 DIAGNOSIS — M6281 Muscle weakness (generalized): Secondary | ICD-10-CM | POA: Diagnosis not present

## 2017-05-04 DIAGNOSIS — G3184 Mild cognitive impairment, so stated: Secondary | ICD-10-CM | POA: Diagnosis not present

## 2017-05-04 DIAGNOSIS — M545 Low back pain: Secondary | ICD-10-CM | POA: Diagnosis not present

## 2017-05-04 DIAGNOSIS — F419 Anxiety disorder, unspecified: Secondary | ICD-10-CM | POA: Diagnosis not present

## 2017-05-04 DIAGNOSIS — F331 Major depressive disorder, recurrent, moderate: Secondary | ICD-10-CM | POA: Diagnosis not present

## 2017-05-04 DIAGNOSIS — R1310 Dysphagia, unspecified: Secondary | ICD-10-CM | POA: Diagnosis not present

## 2017-05-04 DIAGNOSIS — I5032 Chronic diastolic (congestive) heart failure: Secondary | ICD-10-CM | POA: Diagnosis not present

## 2017-05-04 DIAGNOSIS — R1312 Dysphagia, oropharyngeal phase: Secondary | ICD-10-CM | POA: Diagnosis not present

## 2017-05-05 DIAGNOSIS — M545 Low back pain: Secondary | ICD-10-CM | POA: Diagnosis not present

## 2017-05-05 DIAGNOSIS — R1312 Dysphagia, oropharyngeal phase: Secondary | ICD-10-CM | POA: Diagnosis not present

## 2017-05-05 DIAGNOSIS — Z9181 History of falling: Secondary | ICD-10-CM | POA: Diagnosis not present

## 2017-05-05 DIAGNOSIS — I5032 Chronic diastolic (congestive) heart failure: Secondary | ICD-10-CM | POA: Diagnosis not present

## 2017-05-05 DIAGNOSIS — M6281 Muscle weakness (generalized): Secondary | ICD-10-CM | POA: Diagnosis not present

## 2017-05-05 DIAGNOSIS — R1310 Dysphagia, unspecified: Secondary | ICD-10-CM | POA: Diagnosis not present

## 2017-05-06 DIAGNOSIS — M545 Low back pain: Secondary | ICD-10-CM | POA: Diagnosis not present

## 2017-05-06 DIAGNOSIS — I5032 Chronic diastolic (congestive) heart failure: Secondary | ICD-10-CM | POA: Diagnosis not present

## 2017-05-06 DIAGNOSIS — M6281 Muscle weakness (generalized): Secondary | ICD-10-CM | POA: Diagnosis not present

## 2017-05-06 DIAGNOSIS — R1312 Dysphagia, oropharyngeal phase: Secondary | ICD-10-CM | POA: Diagnosis not present

## 2017-05-06 DIAGNOSIS — R1310 Dysphagia, unspecified: Secondary | ICD-10-CM | POA: Diagnosis not present

## 2017-05-06 DIAGNOSIS — Z9181 History of falling: Secondary | ICD-10-CM | POA: Diagnosis not present

## 2017-05-07 ENCOUNTER — Encounter: Payer: Self-pay | Admitting: Internal Medicine

## 2017-05-07 NOTE — Assessment & Plan Note (Signed)
No reports of pain; continue Neurontin 300 mg by mouth daily at bedtime 

## 2017-05-07 NOTE — Assessment & Plan Note (Signed)
Stable; plan to monitor at intervals

## 2017-05-07 NOTE — Assessment & Plan Note (Signed)
Chronic and stable; continue Cozaar 25 mg by mouth daily and Lasix 40 mg by mouth daily

## 2017-05-07 NOTE — Assessment & Plan Note (Signed)
A1c is 5.7 very good control; continue Glucophage 24 hour 500 mg by mouth daily and cozaar 25 mg by mouth daily

## 2017-05-07 NOTE — Assessment & Plan Note (Signed)
No reports of  reflux; continue omeprazole 20 mg by mouth daily 

## 2017-05-07 NOTE — Assessment & Plan Note (Signed)
hronic and stable without exacerbation;continue Cozaar 25 mg by mouth daily and Lasix 40 mg by mouth daily

## 2017-05-08 DIAGNOSIS — I5032 Chronic diastolic (congestive) heart failure: Secondary | ICD-10-CM | POA: Diagnosis not present

## 2017-05-08 DIAGNOSIS — R1310 Dysphagia, unspecified: Secondary | ICD-10-CM | POA: Diagnosis not present

## 2017-05-08 DIAGNOSIS — R1312 Dysphagia, oropharyngeal phase: Secondary | ICD-10-CM | POA: Diagnosis not present

## 2017-05-08 DIAGNOSIS — Z9181 History of falling: Secondary | ICD-10-CM | POA: Diagnosis not present

## 2017-05-08 DIAGNOSIS — M545 Low back pain: Secondary | ICD-10-CM | POA: Diagnosis not present

## 2017-05-08 DIAGNOSIS — M6281 Muscle weakness (generalized): Secondary | ICD-10-CM | POA: Diagnosis not present

## 2017-05-09 DIAGNOSIS — Z9181 History of falling: Secondary | ICD-10-CM | POA: Diagnosis not present

## 2017-05-09 DIAGNOSIS — I5032 Chronic diastolic (congestive) heart failure: Secondary | ICD-10-CM | POA: Diagnosis not present

## 2017-05-09 DIAGNOSIS — M6281 Muscle weakness (generalized): Secondary | ICD-10-CM | POA: Diagnosis not present

## 2017-05-09 DIAGNOSIS — R1312 Dysphagia, oropharyngeal phase: Secondary | ICD-10-CM | POA: Diagnosis not present

## 2017-05-09 DIAGNOSIS — R1310 Dysphagia, unspecified: Secondary | ICD-10-CM | POA: Diagnosis not present

## 2017-05-09 DIAGNOSIS — M545 Low back pain: Secondary | ICD-10-CM | POA: Diagnosis not present

## 2017-05-10 DIAGNOSIS — I5032 Chronic diastolic (congestive) heart failure: Secondary | ICD-10-CM | POA: Diagnosis not present

## 2017-05-10 DIAGNOSIS — Z9181 History of falling: Secondary | ICD-10-CM | POA: Diagnosis not present

## 2017-05-10 DIAGNOSIS — M6281 Muscle weakness (generalized): Secondary | ICD-10-CM | POA: Diagnosis not present

## 2017-05-10 DIAGNOSIS — R1310 Dysphagia, unspecified: Secondary | ICD-10-CM | POA: Diagnosis not present

## 2017-05-10 DIAGNOSIS — R1312 Dysphagia, oropharyngeal phase: Secondary | ICD-10-CM | POA: Diagnosis not present

## 2017-05-10 DIAGNOSIS — M545 Low back pain: Secondary | ICD-10-CM | POA: Diagnosis not present

## 2017-05-11 DIAGNOSIS — R1312 Dysphagia, oropharyngeal phase: Secondary | ICD-10-CM | POA: Diagnosis not present

## 2017-05-11 DIAGNOSIS — I5032 Chronic diastolic (congestive) heart failure: Secondary | ICD-10-CM | POA: Diagnosis not present

## 2017-05-11 DIAGNOSIS — M6281 Muscle weakness (generalized): Secondary | ICD-10-CM | POA: Diagnosis not present

## 2017-05-11 DIAGNOSIS — M545 Low back pain: Secondary | ICD-10-CM | POA: Diagnosis not present

## 2017-05-11 DIAGNOSIS — Z9181 History of falling: Secondary | ICD-10-CM | POA: Diagnosis not present

## 2017-05-11 DIAGNOSIS — R1310 Dysphagia, unspecified: Secondary | ICD-10-CM | POA: Diagnosis not present

## 2017-05-12 DIAGNOSIS — I5032 Chronic diastolic (congestive) heart failure: Secondary | ICD-10-CM | POA: Diagnosis not present

## 2017-05-12 DIAGNOSIS — R1310 Dysphagia, unspecified: Secondary | ICD-10-CM | POA: Diagnosis not present

## 2017-05-12 DIAGNOSIS — M545 Low back pain: Secondary | ICD-10-CM | POA: Diagnosis not present

## 2017-05-12 DIAGNOSIS — Z9181 History of falling: Secondary | ICD-10-CM | POA: Diagnosis not present

## 2017-05-12 DIAGNOSIS — R1312 Dysphagia, oropharyngeal phase: Secondary | ICD-10-CM | POA: Diagnosis not present

## 2017-05-12 DIAGNOSIS — M6281 Muscle weakness (generalized): Secondary | ICD-10-CM | POA: Diagnosis not present

## 2017-05-13 DIAGNOSIS — M6281 Muscle weakness (generalized): Secondary | ICD-10-CM | POA: Diagnosis not present

## 2017-05-13 DIAGNOSIS — Z9181 History of falling: Secondary | ICD-10-CM | POA: Diagnosis not present

## 2017-05-13 DIAGNOSIS — I5032 Chronic diastolic (congestive) heart failure: Secondary | ICD-10-CM | POA: Diagnosis not present

## 2017-05-13 DIAGNOSIS — R1310 Dysphagia, unspecified: Secondary | ICD-10-CM | POA: Diagnosis not present

## 2017-05-13 DIAGNOSIS — R1312 Dysphagia, oropharyngeal phase: Secondary | ICD-10-CM | POA: Diagnosis not present

## 2017-05-13 DIAGNOSIS — M545 Low back pain: Secondary | ICD-10-CM | POA: Diagnosis not present

## 2017-05-14 DIAGNOSIS — M545 Low back pain: Secondary | ICD-10-CM | POA: Diagnosis not present

## 2017-05-14 DIAGNOSIS — R1310 Dysphagia, unspecified: Secondary | ICD-10-CM | POA: Diagnosis not present

## 2017-05-14 DIAGNOSIS — R1312 Dysphagia, oropharyngeal phase: Secondary | ICD-10-CM | POA: Diagnosis not present

## 2017-05-14 DIAGNOSIS — M6281 Muscle weakness (generalized): Secondary | ICD-10-CM | POA: Diagnosis not present

## 2017-05-14 DIAGNOSIS — I5032 Chronic diastolic (congestive) heart failure: Secondary | ICD-10-CM | POA: Diagnosis not present

## 2017-05-14 DIAGNOSIS — Z9181 History of falling: Secondary | ICD-10-CM | POA: Diagnosis not present

## 2017-05-15 DIAGNOSIS — Z9181 History of falling: Secondary | ICD-10-CM | POA: Diagnosis not present

## 2017-05-15 DIAGNOSIS — R1310 Dysphagia, unspecified: Secondary | ICD-10-CM | POA: Diagnosis not present

## 2017-05-15 DIAGNOSIS — M545 Low back pain: Secondary | ICD-10-CM | POA: Diagnosis not present

## 2017-05-15 DIAGNOSIS — I5032 Chronic diastolic (congestive) heart failure: Secondary | ICD-10-CM | POA: Diagnosis not present

## 2017-05-15 DIAGNOSIS — R1312 Dysphagia, oropharyngeal phase: Secondary | ICD-10-CM | POA: Diagnosis not present

## 2017-05-15 DIAGNOSIS — M6281 Muscle weakness (generalized): Secondary | ICD-10-CM | POA: Diagnosis not present

## 2017-05-16 DIAGNOSIS — I5032 Chronic diastolic (congestive) heart failure: Secondary | ICD-10-CM | POA: Diagnosis not present

## 2017-05-16 DIAGNOSIS — R1310 Dysphagia, unspecified: Secondary | ICD-10-CM | POA: Diagnosis not present

## 2017-05-16 DIAGNOSIS — Z9181 History of falling: Secondary | ICD-10-CM | POA: Diagnosis not present

## 2017-05-16 DIAGNOSIS — M545 Low back pain: Secondary | ICD-10-CM | POA: Diagnosis not present

## 2017-05-16 DIAGNOSIS — M6281 Muscle weakness (generalized): Secondary | ICD-10-CM | POA: Diagnosis not present

## 2017-05-16 DIAGNOSIS — R1312 Dysphagia, oropharyngeal phase: Secondary | ICD-10-CM | POA: Diagnosis not present

## 2017-05-20 DIAGNOSIS — R1310 Dysphagia, unspecified: Secondary | ICD-10-CM | POA: Diagnosis not present

## 2017-05-20 DIAGNOSIS — I5032 Chronic diastolic (congestive) heart failure: Secondary | ICD-10-CM | POA: Diagnosis not present

## 2017-05-20 DIAGNOSIS — R1312 Dysphagia, oropharyngeal phase: Secondary | ICD-10-CM | POA: Diagnosis not present

## 2017-05-20 DIAGNOSIS — Z9181 History of falling: Secondary | ICD-10-CM | POA: Diagnosis not present

## 2017-05-20 DIAGNOSIS — M545 Low back pain: Secondary | ICD-10-CM | POA: Diagnosis not present

## 2017-05-20 DIAGNOSIS — M6281 Muscle weakness (generalized): Secondary | ICD-10-CM | POA: Diagnosis not present

## 2017-05-21 DIAGNOSIS — R1312 Dysphagia, oropharyngeal phase: Secondary | ICD-10-CM | POA: Diagnosis not present

## 2017-05-21 DIAGNOSIS — Z9181 History of falling: Secondary | ICD-10-CM | POA: Diagnosis not present

## 2017-05-21 DIAGNOSIS — I5032 Chronic diastolic (congestive) heart failure: Secondary | ICD-10-CM | POA: Diagnosis not present

## 2017-05-21 DIAGNOSIS — M545 Low back pain: Secondary | ICD-10-CM | POA: Diagnosis not present

## 2017-05-21 DIAGNOSIS — R1310 Dysphagia, unspecified: Secondary | ICD-10-CM | POA: Diagnosis not present

## 2017-05-21 DIAGNOSIS — M6281 Muscle weakness (generalized): Secondary | ICD-10-CM | POA: Diagnosis not present

## 2017-05-22 DIAGNOSIS — M6281 Muscle weakness (generalized): Secondary | ICD-10-CM | POA: Diagnosis not present

## 2017-05-22 DIAGNOSIS — M545 Low back pain: Secondary | ICD-10-CM | POA: Diagnosis not present

## 2017-05-22 DIAGNOSIS — R1310 Dysphagia, unspecified: Secondary | ICD-10-CM | POA: Diagnosis not present

## 2017-05-22 DIAGNOSIS — Z9181 History of falling: Secondary | ICD-10-CM | POA: Diagnosis not present

## 2017-05-22 DIAGNOSIS — I5032 Chronic diastolic (congestive) heart failure: Secondary | ICD-10-CM | POA: Diagnosis not present

## 2017-05-22 DIAGNOSIS — R1312 Dysphagia, oropharyngeal phase: Secondary | ICD-10-CM | POA: Diagnosis not present

## 2017-05-23 DIAGNOSIS — M6281 Muscle weakness (generalized): Secondary | ICD-10-CM | POA: Diagnosis not present

## 2017-05-23 DIAGNOSIS — R1312 Dysphagia, oropharyngeal phase: Secondary | ICD-10-CM | POA: Diagnosis not present

## 2017-05-23 DIAGNOSIS — R1310 Dysphagia, unspecified: Secondary | ICD-10-CM | POA: Diagnosis not present

## 2017-05-23 DIAGNOSIS — Z9181 History of falling: Secondary | ICD-10-CM | POA: Diagnosis not present

## 2017-05-23 DIAGNOSIS — M545 Low back pain: Secondary | ICD-10-CM | POA: Diagnosis not present

## 2017-05-23 DIAGNOSIS — I5032 Chronic diastolic (congestive) heart failure: Secondary | ICD-10-CM | POA: Diagnosis not present

## 2017-05-24 DIAGNOSIS — R1310 Dysphagia, unspecified: Secondary | ICD-10-CM | POA: Diagnosis not present

## 2017-05-24 DIAGNOSIS — M6281 Muscle weakness (generalized): Secondary | ICD-10-CM | POA: Diagnosis not present

## 2017-05-24 DIAGNOSIS — I5032 Chronic diastolic (congestive) heart failure: Secondary | ICD-10-CM | POA: Diagnosis not present

## 2017-05-24 DIAGNOSIS — Z9181 History of falling: Secondary | ICD-10-CM | POA: Diagnosis not present

## 2017-05-24 DIAGNOSIS — R1312 Dysphagia, oropharyngeal phase: Secondary | ICD-10-CM | POA: Diagnosis not present

## 2017-05-24 DIAGNOSIS — M545 Low back pain: Secondary | ICD-10-CM | POA: Diagnosis not present

## 2017-05-25 ENCOUNTER — Encounter: Payer: Self-pay | Admitting: Internal Medicine

## 2017-05-25 ENCOUNTER — Non-Acute Institutional Stay (SKILLED_NURSING_FACILITY): Payer: Medicare Other | Admitting: Internal Medicine

## 2017-05-25 DIAGNOSIS — E785 Hyperlipidemia, unspecified: Secondary | ICD-10-CM | POA: Diagnosis not present

## 2017-05-25 DIAGNOSIS — D508 Other iron deficiency anemias: Secondary | ICD-10-CM | POA: Diagnosis not present

## 2017-05-25 DIAGNOSIS — E1142 Type 2 diabetes mellitus with diabetic polyneuropathy: Secondary | ICD-10-CM

## 2017-05-25 NOTE — Progress Notes (Signed)
Location[facility]  [facility] Room Number: 201D Place of Service:  SNF (31)  [name], Mandolin Falwell D, MD  Patient Care Team: [name], Londa Mackowski D, MD as PCP - General (Internal Medicine)  Extended Emergency Contact Information Primary Emergency Contact: Luckow,[name] Address: [address]          [place], [place] [address] [place] Home Phone: [phone] Mobile Phone: [phone] Relation: Sister Secondary Emergency Contact: [name],[name] Address: [address]          [place], [place] [address] [place] Home Phone: [phone] Relation: Grandaughter    Allergies: Tequin [gatifloxacin]  Chief Complaint  Patient presents with  . Medical Management of Chronic Issues    routine visit    HPI: Patient is 83 y.o. female who is being seen for routine issues of iron deficiency anemia, hyperlipidemia, and polyneuropathy associated with diabetes mellitus 2.  Past Medical History:  Diagnosis Date  . Breast mass, right 12/13/2013  . Chronic diastolic CHF (congestive heart failure) (HCC) 07/19/2013  . Chronic diastolic heart failure (HCC)   . Chronic kidney disease, stage II (mild)   . Chronic renal disease, stage 2, mildly decreased glomerular filtration rate (GFR) between 60-89 mL/min/1.73 square meter 09/03/2014  . Closed T12 fracture (HCC) 07/16/2013  . Depression 07/18/2013  . Diabetes mellitus   . Diabetes mellitus due to underlying condition (HCC) 06/21/2013  . Diabetes mellitus without complication (HCC)   . Dysphagia, oropharyngeal phase   . GERD (gastroesophageal reflux disease) 12/17/2014  . Gout   . Hypertension   . Hypertensive heart disease with congestive heart failure (HCC) 06/21/2013  . Iron deficiency anemia 09/03/2014  . Iron deficiency anemia, unspecified   . Leukocytosis 07/18/2013  . Lumbago 10/23/2013  . Lung nodule < 6cm on CT 11/13/2016  . Osteopenia 07/16/2013  . Syncope 06/21/2013  . Type 2 diabetes,  controlled, with neuropathy (HCC) 10/18/2013  . Unspecified constipation 10/18/2013  . Vitamin D deficiency 04/11/2016    Past Surgical History:  Procedure Laterality Date  . NO PAST SURGERIES      Allergies as of 05/25/2017      Reactions   Tequin [gatifloxacin]       Medication List        Accurate as of 05/25/17 11:59 PM. Always use your most recent med list.          acetaminophen 325 MG tablet Commonly known as:  TYLENOL Take 325 mg by mouth. Take 2 tablets every 8 hours as needed for pain   acetaminophen 500 MG tablet Commonly known as:  TYLENOL Take 500 mg by mouth. Take 2 tablets (1000 mg) three times a day for pain   ARTIFICIAL TEARS OP Apply to eye. One drop to each eye four times a day for dry eyes   aspirin 81 MG chewable tablet Chew 1 tablet (81 mg total) by mouth daily.   ferrous sulfate 325 (65 FE) MG tablet Take 325 mg by mouth daily with breakfast.   furosemide 40 MG tablet Commonly known as:  LASIX Take 40 mg by mouth every morning.   gabapentin 300 MG capsule Commonly known as:  NEURONTIN Take 1 capsule (300 mg total) by mouth at bedtime.   ipratropium-albuterol 0.5-2.5 (3) MG/3ML Soln Commonly known as:  DUONEB Take 3 mLs by nebulization every 6 (six) hours as needed.   lidocaine 5 % Commonly known as:  LIDODERM Place 1 patch onto the skin daily. Apply to right  knee and lumbar Remove & Discard patch within 12 hours or as directed by MD   losartan 25 MG tablet Commonly known as:  COZAAR Take 25 mg by mouth every morning. For HTN   metFORMIN 500 MG 24 hr tablet Commonly known as:  GLUCOPHAGE-XR Take 500 mg by mouth every evening. For diabetes   NUTRITIONAL SUPPLEMENT Liqd Take by mouth. Magic cup-take one cup with dinner as supplement   omeprazole 20 MG capsule Commonly known as:  PRILOSEC Take 20 mg by mouth daily. For GERD   potassium chloride 10 MEQ tablet Commonly known as:  K-DUR Take 10 mEq by mouth. Take 3 tablets (30  meq) daily for potassium supplement   traMADol 50 MG tablet Commonly known as:  ULTRAM Take 50 mg by mouth. Take one tablet twice daily as needed for pain   traMADol-acetaminophen 37.5-325 MG tablet Commonly known as:  ULTRACET Take 2 tablets by mouth. Take two tablets every 6 hours as needed for severe pain       No orders of the defined types were placed in this encounter.   Immunization History  Administered Date(s) Administered  . Influenza-Unspecified 03/23/2015, 04/06/2016, 04/06/2017  . PPD Test 08/12/2013  . Pneumococcal Polysaccharide-23 06/23/2013    Social History   Tobacco Use  . Smoking status: Former Games developer  . Smokeless tobacco: Never Used  Substance Use Topics  . Alcohol use: No    Review of Systems  DATA OBTAINED: from patient, nurse GENERAL:  no fevers, fatigue, appetite changes SKIN: No itching, rash HEENT: No complaint RESPIRATORY: No cough, wheezing, SOB CARDIAC: No chest pain, palpitations, lower extremity edema  GI: No abdominal pain, No N/V/D or constipation, No heartburn or reflux  GU: No dysuria, frequency or urgency, or incontinence  MUSCULOSKELETAL: No unrelieved bone/joint pain NEUROLOGIC: No headache, dizziness  PSYCHIATRIC: No overt anxiety or sadness  Vitals:   05/25/17 1118  BP: 132/66  Pulse: 73  Resp: 18  Temp: 98 F (36.7 C)  SpO2: 95%   Body mass index is 21.42 kg/m. Physical Exam  GENERAL APPEARANCE: Alert,  No acute distress  SKIN: No diaphoresis rash HEENT: Unremarkable RESPIRATORY: Breathing is even, unlabored. Lung sounds are clear   CARDIOVASCULAR: Heart RRR no murmurs, rubs or gallops. No peripheral edema  GASTROINTESTINAL: Abdomen is soft, non-tender, not distended w/ normal bowel sounds.  GENITOURINARY: Bladder non tender, not distended  MUSCULOSKELETAL: No abnormal joints or musculature NEUROLOGIC: Cranial nerves 2-12 grossly intact. Moves all extremities PSYCHIATRIC: Mood and affect appropriate to  situation, no behavioral issues  Patient Active Problem List   Diagnosis Date Noted  . Lung nodule < 6cm on CT 11/13/2016  . Vitamin D deficiency 04/11/2016  . Hyperlipidemia 02/11/2016  . Pain 11/07/2015  . Wheezing 11/07/2015  . Edema 10/25/2015  . Conjunctivitis 07/09/2015  . UTI (urinary tract infection) 03/24/2015  . GERD (gastroesophageal reflux disease) 12/17/2014  . Hypokalemia 12/17/2014  . Depression 11/25/2014  . Iron deficiency anemia 09/03/2014  . Chronic renal disease, stage 2, mildly decreased glomerular filtration rate (GFR) between 60-89 mL/min/1.73 square meter 09/03/2014  . Pain in joint, lower leg 08/21/2014  . Cough 01/27/2014  . Dizziness 01/12/2014  . Contusion of unspecified site 01/12/2014  . Breast mass, right 12/13/2013  . Lumbago 10/23/2013  . Type 2 diabetes, controlled, with neuropathy (HCC) 10/18/2013  . 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 Unspecified constipation 10/18/2013  . Plantar fasciitis, left 07/29/2013  . Physical deconditioning  07/21/2013  . Diabetes mellitus, controlled (HCC) 07/19/2013  . Chronic diastolic CHF (congestive heart failure) (HCC) 07/19/2013  . HCAP (healthcare-associated pneumonia) 07/18/2013  . Chronic diastolic heart failure (HCC) 06/24/2013  . Syncope 06/21/2013  . Syncope and collapse 06/21/2013  . Leukocytosis 06/21/2013  . Hypertensive heart disease with congestive heart failure (HCC) 06/21/2013  . Diabetes mellitus due to underlying condition (HCC) 06/21/2013    CMP     Component Value Date/Time   NA 144 02/15/2017   K 4.4 02/15/2017   CL 106 10/09/2016 1425   CO2 26 10/09/2016 1425   GLUCOSE 97 10/09/2016 1425   BUN 16 02/15/2017   CREATININE 1.0 02/15/2017   CREATININE 0.82 10/09/2016 1425   CALCIUM 8.8 (L) 10/09/2016 1425   PROT 6.4 (L) 10/09/2016 1425   ALBUMIN 3.4 (L) 10/09/2016 1425   AST 27 02/15/2017   ALT 15 02/15/2017   ALKPHOS 112 02/15/2017     BILITOT 0.5 10/09/2016 1425   GFRNONAA >60 10/09/2016 1425   GFRAA >60 10/09/2016 1425   Recent Labs    10/09/16 1425 01/16/17 02/15/17  NA 141 145 144  K 4.6 3.6 4.4  CL 106  --   --   CO2 26  --   --   GLUCOSE 97  --   --   BUN 13 19 16   CREATININE 0.82 0.9 1.0  CALCIUM 8.8*  --   --    Recent Labs    10/09/16 1425 01/16/17 02/15/17  AST 19 18 27   ALT 12* 9 15  ALKPHOS 73 85 112  BILITOT 0.5  --   --   PROT 6.4*  --   --   ALBUMIN 3.4*  --   --    Recent Labs    10/09/16 1425 01/16/17 02/15/17  WBC 6.6 5.9 4.6  NEUTROABS 5.0  --   --   HGB 11.7* 12.2 14.1  HCT 36.1 37 43  MCV 91.2  --   --   PLT 156 180 239   Recent Labs    01/16/17  CHOL 166  LDLCALC 97  TRIG 100   No results found for: MICROALBUR Lab Results  Component Value Date   TSH 0.39 (A) 02/15/2017   Lab Results  Component Value Date   HGBA1C 5.7 01/16/2017   Lab Results  Component Value Date   CHOL 166 01/16/2017   HDL 49 01/16/2017   LDLCALC 97 01/16/2017   TRIG 100 01/16/2017   CHOLHDL 3.2 06/22/2013    Significant Diagnostic Results in last 30 days:  No results found.  Assessment and Plan  Iron deficiency anemia Most recent hemoglobin 14.1, which is excellent; plan to continue iron 325 by mouth daily  Hyperlipidemia LDL 97, HDL 49; do not recommend statins at this time  Peripheral sensory neuropathy due to type 2 diabetes mellitus No complaint of pain; continue Neurontin 300 mg by mouth daily at bedtime    Thurston HoleAnne D. Lyn HollingsheadAlexander, MD

## 2017-06-02 ENCOUNTER — Other Ambulatory Visit: Payer: Self-pay

## 2017-06-02 MED ORDER — TRAMADOL HCL 50 MG PO TABS: ORAL_TABLET | ORAL | 0 refills | Status: DC

## 2017-06-08 DIAGNOSIS — F419 Anxiety disorder, unspecified: Secondary | ICD-10-CM | POA: Diagnosis not present

## 2017-06-08 DIAGNOSIS — F329 Major depressive disorder, single episode, unspecified: Secondary | ICD-10-CM | POA: Diagnosis not present

## 2017-06-08 DIAGNOSIS — G3184 Mild cognitive impairment, so stated: Secondary | ICD-10-CM | POA: Diagnosis not present

## 2017-06-23 ENCOUNTER — Encounter: Payer: Self-pay | Admitting: Internal Medicine

## 2017-06-23 ENCOUNTER — Non-Acute Institutional Stay (SKILLED_NURSING_FACILITY): Payer: Medicare Other | Admitting: Internal Medicine

## 2017-06-23 DIAGNOSIS — I110 Hypertensive heart disease with heart failure: Secondary | ICD-10-CM | POA: Diagnosis not present

## 2017-06-23 DIAGNOSIS — I5032 Chronic diastolic (congestive) heart failure: Secondary | ICD-10-CM | POA: Diagnosis not present

## 2017-06-23 DIAGNOSIS — I5022 Chronic systolic (congestive) heart failure: Secondary | ICD-10-CM

## 2017-06-23 DIAGNOSIS — K219 Gastro-esophageal reflux disease without esophagitis: Secondary | ICD-10-CM

## 2017-06-23 NOTE — Progress Notes (Signed)
[facility]  [facility] Room Number: 201D Place of Service:  SNF (31)  [name], Nasif Bos D, MD  Patient Care Team: [name], Ahlani Wickes D, MD as PCP - General (Internal Medicine)  Extended Emergency Contact Information Primary Emergency Contact: Holsapple,[name] Address: [address]          [place], [place] [address] [place] Home Phone: [phone] Mobile Phone: [phone] Relation: Sister Secondary Emergency Contact: [name],[name] Address: [address]          [place], [place] [address] [place] Home Phone: [phone] Relation: Grandaughter    Allergies: Tequin [gatifloxacin]  Chief Complaint  Patient presents with  . Medical Management of Chronic Issues    routine visit    HPI: Patient is 83 y.o. female who is being seen for routine issues of hypertension, congestive heart failure, and GERD.  Past Medical History:  Diagnosis Date  . Breast mass, right 12/13/2013  . Chronic diastolic CHF (congestive heart failure) (HCC) 07/19/2013  . Chronic diastolic heart failure (HCC)   . Chronic kidney disease, stage II (mild)   . Chronic renal disease, stage 2, mildly decreased glomerular filtration rate (GFR) between 60-89 mL/min/1.73 square meter 09/03/2014  . Closed T12 fracture (HCC) 07/16/2013  . Depression 07/18/2013  . Diabetes mellitus   . Diabetes mellitus due to underlying condition (HCC) 06/21/2013  . Diabetes mellitus without complication (HCC)   . Dysphagia, oropharyngeal phase   . GERD (gastroesophageal reflux disease) 12/17/2014  . Gout   . Hypertension   . Hypertensive heart disease with congestive heart failure (HCC) 06/21/2013  . Iron deficiency anemia 09/03/2014  . Iron deficiency anemia, unspecified   . Leukocytosis 07/18/2013  . Lumbago 10/23/2013  . Lung nodule < 6cm on CT 11/13/2016  . Osteopenia 07/16/2013  . Syncope 06/21/2013  . Type 2 diabetes, controlled, with neuropathy (HCC) 10/18/2013  .  Unspecified constipation 10/18/2013  . Vitamin D deficiency 04/11/2016    Past Surgical History:  Procedure Laterality Date  . NO PAST SURGERIES      Allergies as of 06/23/2017      Reactions   Tequin [gatifloxacin]       Medication List        Accurate as of 06/23/17 11:59 PM. Always use your most recent med list.          ARTIFICIAL TEARS OP Apply to eye. One drop to each eye four times a day for dry eyes   aspirin 81 MG chewable tablet Chew 1 tablet (81 mg total) by mouth daily.   ferrous sulfate 325 (65 FE) MG tablet Take 325 mg by mouth daily with breakfast.   furosemide 40 MG tablet Commonly known as:  LASIX Take 40 mg by mouth every morning.   gabapentin 300 MG capsule Commonly known as:  NEURONTIN Take 1 capsule (300 mg total) by mouth at bedtime.   ipratropium-albuterol 0.5-2.5 (3) MG/3ML Soln Commonly known as:  DUONEB Take 3 mLs by nebulization every 6 (six) hours as needed.   lidocaine 5 % Commonly known as:  LIDODERM Place 1 patch onto the skin daily. Apply to right knee and lumbar Remove & Discard patch within 12 hours or as directed by MD   losartan 25 MG tablet Commonly known as:  COZAAR Take 25 mg by mouth every morning. For HTN   metFORMIN 500 MG 24 hr tablet Commonly known as:  GLUCOPHAGE-XR Take 500 mg by mouth every evening. For diabetes  NUTRITIONAL SUPPLEMENT Liqd Take by mouth. Magic cup-take one cup with dinner as supplement   omeprazole 20 MG capsule Commonly known as:  PRILOSEC Take 20 mg by mouth daily. For GERD   potassium chloride 10 MEQ tablet Commonly known as:  K-DUR Take 10 mEq by mouth. Take 3 tablets (30 meq) daily for potassium supplement   traMADol 50 MG tablet Commonly known as:  ULTRAM Take one tablet twice daily as needed for pain       No orders of the defined types were placed in this encounter.   Immunization History  Administered Date(s) Administered  . Influenza-Unspecified 03/23/2015,  04/06/2016, 04/06/2017  . PPD Test 08/12/2013  . Pneumococcal Polysaccharide-23 06/23/2013    Social History   Tobacco Use  . Smoking status: Former Games developer  . Smokeless tobacco: Never Used  Substance Use Topics  . Alcohol use: No    Review of Systems  DATA OBTAINED: from patient, nurse GENERAL:  no fevers, fatigue, appetite changes SKIN: No itching, rash HEENT: No complaint RESPIRATORY: No cough, wheezing, SOB CARDIAC: No chest pain, palpitations, lower extremity edema  GI: No abdominal pain, No N/V/D or constipation, No heartburn or reflux  GU: No dysuria, frequency or urgency, or incontinence  MUSCULOSKELETAL: No unrelieved bone/joint pain NEUROLOGIC: No headache, dizziness  PSYCHIATRIC: No overt anxiety or sadness  Vitals:   06/23/17 1034  BP: 130/69  Pulse: 77  Resp: 18  Temp: 98 F (36.7 C)   Body mass index is 20.47 kg/m. Physical Exam  GENERAL APPEARANCE: Alert, No acute distress  SKIN: No diaphoresis rash HEENT: Unremarkable RESPIRATORY: Breathing is even, unlabored. Lung sounds are clear   CARDIOVASCULAR: Heart RRR no murmurs, rubs or gallops. No peripheral edema  GASTROINTESTINAL: Abdomen is soft, non-tender, not distended w/ normal bowel sounds.  GENITOURINARY: Bladder non tender, not distended  MUSCULOSKELETAL: No abnormal joints or musculature NEUROLOGIC: Cranial nerves 2-12 grossly intact. Moves all extremities PSYCHIATRIC: Mood and affect appropriate to situation, no behavioral issues  Patient Active Problem List   Diagnosis Date Noted  . Lung nodule < 6cm on CT 11/13/2016  . Vitamin D deficiency 04/11/2016  . Hyperlipidemia 02/11/2016  . Pain 11/07/2015  . Wheezing 11/07/2015  . Edema 10/25/2015  . Conjunctivitis 07/09/2015  . UTI (urinary tract infection) 03/24/2015  . GERD (gastroesophageal reflux disease) 12/17/2014  . Hypokalemia 12/17/2014  . Depression 11/25/2014  . Iron deficiency anemia 09/03/2014  . Chronic renal disease, stage  2, mildly decreased glomerular filtration rate (GFR) between 60-89 mL/min/1.73 square meter 09/03/2014  . Pain in joint, lower leg 08/21/2014  . Cough 01/27/2014  . Dizziness 01/12/2014  . Contusion of unspecified site 01/12/2014  . Breast mass, right 12/13/2013  . Lumbago 10/23/2013  . Type 2 diabetes, controlled, with neuropathy (HCC) 10/18/2013  . 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 Unspecified constipation 10/18/2013  . Plantar fasciitis, left 07/29/2013  . Physical deconditioning 07/21/2013  . Diabetes mellitus, controlled (HCC) 07/19/2013  . Chronic diastolic CHF (congestive heart failure) (HCC) 07/19/2013  . HCAP (healthcare-associated pneumonia) 07/18/2013  . Chronic diastolic heart failure (HCC) 06/24/2013  . Syncope 06/21/2013  . Syncope and collapse 06/21/2013  . Leukocytosis 06/21/2013  . Hypertensive heart disease with congestive heart failure (HCC) 06/21/2013  . Diabetes mellitus due to underlying condition (HCC) 06/21/2013    CMP     Component Value Date/Time   NA 144 02/15/2017   K 4.4 02/15/2017   CL 106 10/09/2016 1425  CO2 26 10/09/2016 1425   GLUCOSE 97 10/09/2016 1425   BUN 16 02/15/2017   CREATININE 1.0 02/15/2017   CREATININE 0.82 10/09/2016 1425   CALCIUM 8.8 (L) 10/09/2016 1425   PROT 6.4 (L) 10/09/2016 1425   ALBUMIN 3.4 (L) 10/09/2016 1425   AST 27 02/15/2017   ALT 15 02/15/2017   ALKPHOS 112 02/15/2017   BILITOT 0.5 10/09/2016 1425   GFRNONAA >60 10/09/2016 1425   GFRAA >60 10/09/2016 1425   Recent Labs    10/09/16 1425 01/16/17 02/15/17  NA 141 145 144  K 4.6 3.6 4.4  CL 106  --   --   CO2 26  --   --   GLUCOSE 97  --   --   BUN 13 19 16   CREATININE 0.82 0.9 1.0  CALCIUM 8.8*  --   --    Recent Labs    10/09/16 1425 01/16/17 02/15/17  AST 19 18 27   ALT 12* 9 15  ALKPHOS 73 85 112  BILITOT 0.5  --   --   PROT 6.4*  --   --   ALBUMIN 3.4*  --   --    Recent Labs     10/09/16 1425 01/16/17 02/15/17  WBC 6.6 5.9 4.6  NEUTROABS 5.0  --   --   HGB 11.7* 12.2 14.1  HCT 36.1 37 43  MCV 91.2  --   --   PLT 156 180 239   Recent Labs    01/16/17  CHOL 166  LDLCALC 97  TRIG 100   No results found for: MICROALBUR Lab Results  Component Value Date   TSH 0.39 (A) 02/15/2017   Lab Results  Component Value Date   HGBA1C 5.7 01/16/2017   Lab Results  Component Value Date   CHOL 166 01/16/2017   HDL 49 01/16/2017   LDLCALC 97 01/16/2017   TRIG 100 01/16/2017   CHOLHDL 3.2 06/22/2013    Significant Diagnostic Results in last 30 days:  No results found.  Assessment and Plan  Hypertensive heart disease with congestive heart failure Chronic and stable; plan to continue Lasix 40 mg by mouth every morning and Cozaar 25 mg by mouth daily ``````````````````````````````````````````````  Chronic diastolic CHF (congestive heart failure) Chronic and stable without exacerbation; plan to continue Lasix 40 mg by mouth every morning and Cozaar 25 mg by mouth daily  GERD (gastroesophageal reflux disease) No reports of reflux; continue Prilosec 20 mg by mouth daily    Jaelene Garciagarcia D. Lyn HollingsheadAlexander, MD

## 2017-06-25 ENCOUNTER — Encounter: Payer: Self-pay | Admitting: Internal Medicine

## 2017-06-25 NOTE — Assessment & Plan Note (Signed)
No complaint of pain; continue Neurontin 300 mg by mouth daily at bedtime

## 2017-06-25 NOTE — Assessment & Plan Note (Signed)
LDL 97, HDL 49; do not recommend statins at this time

## 2017-06-25 NOTE — Assessment & Plan Note (Signed)
Most recent hemoglobin 14.1, which is excellent; plan to continue iron 325 by mouth daily

## 2017-06-25 NOTE — Assessment & Plan Note (Signed)
Chronic and stable; plan to continue Lasix 40 mg by mouth every morning and Cozaar 25 mg by mouth daily ``````````````````````````````````````````````

## 2017-06-25 NOTE — Assessment & Plan Note (Signed)
No reports of reflux; continue Prilosec 20 mg by mouth daily

## 2017-06-25 NOTE — Assessment & Plan Note (Signed)
Chronic and stable without exacerbation; plan to continue Lasix 40 mg by mouth every morning and Cozaar 25 mg by mouth daily

## 2017-06-30 ENCOUNTER — Non-Acute Institutional Stay (SKILLED_NURSING_FACILITY): Payer: Medicare Other | Admitting: Internal Medicine

## 2017-06-30 DIAGNOSIS — H6121 Impacted cerumen, right ear: Secondary | ICD-10-CM

## 2017-07-01 ENCOUNTER — Encounter: Payer: Self-pay | Admitting: Internal Medicine

## 2017-07-01 NOTE — Progress Notes (Signed)
Location:  [facility]   Place of Service:  SNF (31)skilled nursing facility  [name], Wealthy Danielski D, MD- provider  Patient Care Team: [name], Itzelle Gains D, MD as PCP - General (Internal Medicine)  Extended Emergency Contact Information Primary Emergency Contact: Rader,[name] Address: [address]          [place], [place] [address] [place] Home Phone: [phone] Mobile Phone: [phone] Relation: Sister Secondary Emergency Contact: [name],[name] Address: [address]          [place], [place] [address] [place] Home Phone: [phone] Relation: Grandaughter    Allergies: Tequin [gatifloxacin]  Chief Complaint  Patient presents with  . Acute Visit    HPI: Patient is 84 y.o. female who nursing asked me to see for complaint of pain in right ear with the sensation of water. She's had no fever or cold symptoms.  Past Medical History:  Diagnosis Date  . Breast mass, right 12/13/2013  . Chronic diastolic CHF (congestive heart failure) (HCC) 07/19/2013  . Chronic diastolic heart failure (HCC)   . Chronic kidney disease, stage II (mild)   . Chronic renal disease, stage 2, mildly decreased glomerular filtration rate (GFR) between 60-89 mL/min/1.73 square meter 09/03/2014  . Closed T12 fracture (HCC) 07/16/2013  . Depression 07/18/2013  . Diabetes mellitus   . Diabetes mellitus due to underlying condition (HCC) 06/21/2013  . Diabetes mellitus without complication (HCC)   . Dysphagia, oropharyngeal phase   . GERD (gastroesophageal reflux disease) 12/17/2014  . Gout   . Hypertension   . Hypertensive heart disease with congestive heart failure (HCC) 06/21/2013  . Iron deficiency anemia 09/03/2014  . Iron deficiency anemia, unspecified   . Leukocytosis 07/18/2013  . Lumbago 10/23/2013  . Lung nodule < 6cm on CT 11/13/2016  . Osteopenia 07/16/2013  . Syncope 06/21/2013  . Type 2 diabetes, controlled, with neuropathy (HCC)  10/18/2013  . Unspecified constipation 10/18/2013  . Vitamin D deficiency 04/11/2016    Past Surgical History:  Procedure Laterality Date  . NO PAST SURGERIES      Allergies as of 06/30/2017      Reactions   Tequin [gatifloxacin]       Medication List        Accurate as of 06/30/17 11:59 PM. Always use your most recent med list.          ARTIFICIAL TEARS OP Apply to eye. One drop to each eye four times a day for dry eyes   aspirin 81 MG chewable tablet Chew 1 tablet (81 mg total) by mouth daily.   ferrous sulfate 325 (65 FE) MG tablet Take 325 mg by mouth daily with breakfast.   furosemide 40 MG tablet Commonly known as:  LASIX Take 40 mg by mouth every morning.   gabapentin 300 MG capsule Commonly known as:  NEURONTIN Take 1 capsule (300 mg total) by mouth at bedtime.   ipratropium-albuterol 0.5-2.5 (3) MG/3ML Soln Commonly known as:  DUONEB Take 3 mLs by nebulization every 6 (six) hours as needed.   lidocaine 5 % Commonly known as:  LIDODERM Place 1 patch onto the skin daily. Apply to right knee and lumbar Remove & Discard patch within 12 hours or as directed by MD   losartan 25 MG tablet Commonly known as:  COZAAR Take 25 mg by mouth every morning. For HTN   metFORMIN 500 MG 24 hr tablet Commonly known as:  GLUCOPHAGE-XR Take 500 mg by mouth every  evening. For diabetes   NUTRITIONAL SUPPLEMENT Liqd Take by mouth. Magic cup-take one cup with dinner as supplement   omeprazole 20 MG capsule Commonly known as:  PRILOSEC Take 20 mg by mouth daily. For GERD   potassium chloride 10 MEQ tablet Commonly known as:  K-DUR Take 10 mEq by mouth. Take 3 tablets (30 meq) daily for potassium supplement   traMADol 50 MG tablet Commonly known as:  ULTRAM Take one tablet twice daily as needed for pain       No orders of the defined types were placed in this encounter.   Immunization History  Administered Date(s) Administered  . Influenza-Unspecified  03/23/2015, 04/06/2016, 04/06/2017  . PPD Test 08/12/2013  . Pneumococcal Polysaccharide-23 06/23/2013    Social History   Tobacco Use  . Smoking status: Former Games developer  . Smokeless tobacco: Never Used  Substance Use Topics  . Alcohol use: No    Review of Systems  DATA OBTAINED: from nurse GENERAL:  no fevers, fatigue, appetite changes SKIN: No itching, rash HEENT: Right ear pain RESPIRATORY: No cough, wheezing, SOB CARDIAC: No chest pain, palpitations, lower extremity edema  GI: No abdominal pain, No N/V/D or constipation, No heartburn or reflux  GU: No dysuria, frequency or urgency, or incontinence  MUSCULOSKELETAL: No unrelieved bone/joint pain NEUROLOGIC: No headache, dizziness  PSYCHIATRIC: No overt anxiety or sadness  Vitals:   07/01/17 1723  BP: 121/70  Pulse: 74  Resp: 18  Temp: (!) 97 F (36.1 C)   There is no height or weight on file to calculate BMI. Physical Exam  GENERAL APPEARANCE: Alert,  No acute distress  SKIN: No diaphoresis rash HEENT: Right canal with wax completely covering eardrum RESPIRATORY: Breathing is even, unlabored. Lung sounds are clear   CARDIOVASCULAR: Heart RRR no murmurs, rubs or gallops. No peripheral edema  GASTROINTESTINAL: Abdomen is soft, non-tender, not distended w/ normal bowel sounds.  GENITOURINARY: Bladder non tender, not distended  MUSCULOSKELETAL: No abnormal joints or musculature NEUROLOGIC: Cranial nerves 2-12 grossly intact. Moves all extremities PSYCHIATRIC: Mood and affect appropriate to situation, no behavioral issues  Patient Active Problem List   Diagnosis Date Noted  . Lung nodule < 6cm on CT 11/13/2016  . Vitamin D deficiency 04/11/2016  . Hyperlipidemia 02/11/2016  . Pain 11/07/2015  . Wheezing 11/07/2015  . Edema 10/25/2015  . Conjunctivitis 07/09/2015  . UTI (urinary tract infection) 03/24/2015  . GERD (gastroesophageal reflux disease) 12/17/2014  . Hypokalemia 12/17/2014  . Depression 11/25/2014    . Iron deficiency anemia 09/03/2014  . Chronic renal disease, stage 2, mildly decreased glomerular filtration rate (GFR) between 60-89 mL/min/1.73 square meter 09/03/2014  . Pain in joint, lower leg 08/21/2014  . Cough 01/27/2014  . Dizziness 01/12/2014  . Contusion of unspecified site 01/12/2014  . Breast mass, right 12/13/2013  . Lumbago 10/23/2013  . Type 2 diabetes, controlled, with neuropathy (HCC) 10/18/2013  . 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 Unspecified constipation 10/18/2013  . Plantar fasciitis, left 07/29/2013  . Physical deconditioning 07/21/2013  . Diabetes mellitus, controlled (HCC) 07/19/2013  . Chronic diastolic CHF (congestive heart failure) (HCC) 07/19/2013  . HCAP (healthcare-associated pneumonia) 07/18/2013  . Chronic diastolic heart failure (HCC) 06/24/2013  . Syncope 06/21/2013  . Syncope and collapse 06/21/2013  . Leukocytosis 06/21/2013  . Hypertensive heart disease with congestive heart failure (HCC) 06/21/2013  . Diabetes mellitus due to underlying condition (HCC) 06/21/2013    CMP     Component  Value Date/Time   NA 144 02/15/2017   K 4.4 02/15/2017   CL 106 10/09/2016 1425   CO2 26 10/09/2016 1425   GLUCOSE 97 10/09/2016 1425   BUN 16 02/15/2017   CREATININE 1.0 02/15/2017   CREATININE 0.82 10/09/2016 1425   CALCIUM 8.8 (L) 10/09/2016 1425   PROT 6.4 (L) 10/09/2016 1425   ALBUMIN 3.4 (L) 10/09/2016 1425   AST 27 02/15/2017   ALT 15 02/15/2017   ALKPHOS 112 02/15/2017   BILITOT 0.5 10/09/2016 1425   GFRNONAA >60 10/09/2016 1425   GFRAA >60 10/09/2016 1425   Recent Labs    10/09/16 1425 01/16/17 02/15/17  NA 141 145 144  K 4.6 3.6 4.4  CL 106  --   --   CO2 26  --   --   GLUCOSE 97  --   --   BUN 13 19 16   CREATININE 0.82 0.9 1.0  CALCIUM 8.8*  --   --    Recent Labs    10/09/16 1425 01/16/17 02/15/17  AST 19 18 27   ALT 12* 9 15  ALKPHOS 73 85 112  BILITOT 0.5  --   --   PROT  6.4*  --   --   ALBUMIN 3.4*  --   --    Recent Labs    10/09/16 1425 01/16/17 02/15/17  WBC 6.6 5.9 4.6  NEUTROABS 5.0  --   --   HGB 11.7* 12.2 14.1  HCT 36.1 37 43  MCV 91.2  --   --   PLT 156 180 239   Recent Labs    01/16/17  CHOL 166  LDLCALC 97  TRIG 100   No results found for: MICROALBUR Lab Results  Component Value Date   TSH 0.39 (A) 02/15/2017   Lab Results  Component Value Date   HGBA1C 5.7 01/16/2017   Lab Results  Component Value Date   CHOL 166 01/16/2017   HDL 49 01/16/2017   LDLCALC 97 01/16/2017   TRIG 100 01/16/2017   CHOLHDL 3.2 06/22/2013    Significant Diagnostic Results in last 30 days:  No results found.  Assessment and Plan  Cerumen impaction-start Debrox 4 drops to ear twice a day for 10 days     Merrilee Seashore, MD

## 2017-07-24 ENCOUNTER — Encounter: Payer: Self-pay | Admitting: Internal Medicine

## 2017-07-24 ENCOUNTER — Non-Acute Institutional Stay (SKILLED_NURSING_FACILITY): Payer: Medicare Other | Admitting: Internal Medicine

## 2017-07-24 DIAGNOSIS — E1142 Type 2 diabetes mellitus with diabetic polyneuropathy: Secondary | ICD-10-CM | POA: Diagnosis not present

## 2017-07-24 DIAGNOSIS — K219 Gastro-esophageal reflux disease without esophagitis: Secondary | ICD-10-CM

## 2017-07-24 NOTE — Progress Notes (Signed)
Location:  [facility] Room Number: 201D Place of Service:  SNF (31)  [name], MD  [name], MD as PCP - General (Internal Medicine)  Extended Emergency Contact Information Primary Emergency Contact: Nicol,[name] Address: [address]          [place], [place] [address] [name] Home Phone: [phone] Mobile Phone: [phone] Relation: Sister Secondary Emergency Contact: [name],[name] Address: [address]          [place], [place] [address] [name] of [place] Home Phone: [phone] Relation: Grandaughter    Allergies: Tequin [gatifloxacin]  Chief Complaint  Patient presents with  . Medical Management of Chronic Issues    Routine Visit    HPI: Patient is 84 y.o. female who is being seen for routine issues of diabetes mellitus type 2, polyneuropathy, and GERD.  Past Medical History:  Diagnosis Date  . Breast mass, right 12/13/2013  . Chronic diastolic CHF (congestive heart failure) (HCC) 07/19/2013  . Chronic diastolic heart failure (HCC)   . Chronic kidney disease, stage II (mild)   . Chronic renal disease, stage 2, mildly decreased glomerular filtration rate (GFR) between 60-89 mL/min/1.73 square meter 09/03/2014  . Closed T12 fracture (HCC) 07/16/2013  . Depression 07/18/2013  . Diabetes mellitus   . Diabetes mellitus due to underlying condition (HCC) 06/21/2013  . Diabetes mellitus without complication (HCC)   . Dysphagia, oropharyngeal phase   . GERD (gastroesophageal reflux disease) 12/17/2014  . Gout   . Hypertension   . Hypertensive heart disease with congestive heart failure (HCC) 06/21/2013  . Iron deficiency anemia 09/03/2014  . Iron deficiency anemia, unspecified   . Leukocytosis 07/18/2013  . Lumbago 10/23/2013  . Lung nodule < 6cm on CT 11/13/2016  . Osteopenia 07/16/2013  . Syncope 06/21/2013  . Type 2 diabetes, controlled, with neuropathy (HCC) 10/18/2013    . Unspecified constipation 10/18/2013  . Vitamin D deficiency 04/11/2016    Past Surgical History:  Procedure Laterality Date  . NO PAST SURGERIES      Allergies as of 07/24/2017      Reactions   Tequin [gatifloxacin]       Medication List        Accurate as of 07/24/17 11:59 PM. Always use your most recent med list.          ARTIFICIAL TEARS OP Place 1 drop into both eyes 4 (four) times daily.   aspirin 81 MG chewable tablet Chew 1 tablet (81 mg total) by mouth daily.   ferrous sulfate 325 (65 FE) MG tablet Take 325 mg by mouth daily with breakfast.   furosemide 40 MG tablet Commonly known as:  LASIX Take 40 mg by mouth every morning.   gabapentin 300 MG capsule Commonly known as:  NEURONTIN Take 1 capsule (300 mg total) by mouth at bedtime.   ipratropium-albuterol 0.5-2.5 (3) MG/3ML Soln Commonly known as:  DUONEB Take 3 mLs by nebulization every 6 (six) hours as needed.   lidocaine 5 % Commonly known as:  LIDODERM Place 1 patch onto the skin daily. Apply to right knee and lumbar Remove & Discard patch within 12 hours or as directed by MD   losartan 25 MG tablet Commonly known as:  COZAAR Take 25 mg by mouth every morning. For HTN   metFORMIN 500 MG 24 hr tablet Commonly known as:  GLUCOPHAGE-XR Take 500 mg by mouth every evening. For diabetes   NUTRITIONAL SUPPLEMENT  Liqd Take by mouth. Magic cup-take one cup with dinner as supplement   omeprazole 20 MG capsule Commonly known as:  PRILOSEC Take 20 mg by mouth daily. For GERD   potassium chloride 10 MEQ tablet Commonly known as:  K-DUR Take 10 mEq by mouth. Take 3 tablets (30 meq) daily for potassium supplement   traMADol 50 MG tablet Commonly known as:  ULTRAM Take 50 mg by mouth 2 (two) times daily.       No orders of the defined types were placed in this encounter.   Immunization History  Administered Date(s) Administered  . Influenza-Unspecified 03/23/2015, 04/06/2016, 04/06/2017  .  PPD Test 08/12/2013  . Pneumococcal Polysaccharide-23 06/23/2013    Social History   Tobacco Use  . Smoking status: Former Games developermoker  . Smokeless tobacco: Never Used  Substance Use Topics  . Alcohol use: No    Review of Systems  DATA OBTAINED: from nurse GENERAL:  no fevers, fatigue, appetite changes SKIN: No itching, rash HEENT: No complaint RESPIRATORY: No cough, wheezing, SOB CARDIAC: No chest pain, palpitations, lower extremity edema  GI: No abdominal pain, No N/V/D or constipation, No heartburn or reflux  GU: No dysuria, frequency or urgency, or incontinence  MUSCULOSKELETAL: No unrelieved bone/joint pain NEUROLOGIC: No headache, dizziness  PSYCHIATRIC: No overt anxiety or sadness  Vitals:   07/24/17 1152  BP: 129/70  Pulse: 69  Resp: 16  Temp: 98 F (36.7 C)  SpO2: 95%   Body mass index is 20.43 kg/m. Physical Exam  GENERAL APPEARANCE: Alert, minimally conversant, No acute distress  SKIN: No diaphoresis rash HEENT: Unremarkable RESPIRATORY: Breathing is even, unlabored. Lung sounds are clear   CARDIOVASCULAR: Heart RRR no murmurs, rubs or gallops. No peripheral edema  GASTROINTESTINAL: Abdomen is soft, non-tender, not distended w/ normal bowel sounds.  GENITOURINARY: Bladder non tender, not distended  MUSCULOSKELETAL: No abnormal joints or musculature NEUROLOGIC: Cranial nerves 2-12 grossly intact. Moves all extremities PSYCHIATRIC: Mood and affect appropriate to situation, no behavioral issues  Patient Active Problem List   Diagnosis Date Noted  . Lung nodule < 6cm on CT 11/13/2016  . Vitamin D deficiency 04/11/2016  . Hyperlipidemia 02/11/2016  . Pain 11/07/2015  . Wheezing 11/07/2015  . Edema 10/25/2015  . Conjunctivitis 07/09/2015  . UTI (urinary tract infection) 03/24/2015  . GERD (gastroesophageal reflux disease) 12/17/2014  . Hypokalemia 12/17/2014  . Depression 11/25/2014  . Iron deficiency anemia 09/03/2014  . Chronic renal disease, stage  2, mildly decreased glomerular filtration rate (GFR) between 60-89 mL/min/1.73 square meter 09/03/2014  . Pain in joint, lower leg 08/21/2014  . Cough 01/27/2014  . Dizziness 01/12/2014  . Contusion of unspecified site 01/12/2014  . Breast mass, right 12/13/2013  . Lumbago 10/23/2013  . Type 2 diabetes, controlled, with neuropathy (HCC) 10/18/2013  . 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 Unspecified constipation 10/18/2013  . Plantar fasciitis, left 07/29/2013  . Physical deconditioning 07/21/2013  . Diabetes mellitus, controlled (HCC) 07/19/2013  . Chronic diastolic CHF (congestive heart failure) (HCC) 07/19/2013  . HCAP (healthcare-associated pneumonia) 07/18/2013  . Chronic diastolic heart failure (HCC) 06/24/2013  . Syncope 06/21/2013  . Syncope and collapse 06/21/2013  . Leukocytosis 06/21/2013  . Hypertensive heart disease with congestive heart failure (HCC) 06/21/2013  . Diabetes mellitus due to underlying condition (HCC) 06/21/2013    CMP     Component Value Date/Time   NA 144 02/15/2017   K 4.4 02/15/2017   CL 106 10/09/2016  1425   CO2 26 10/09/2016 1425   GLUCOSE 97 10/09/2016 1425   BUN 16 02/15/2017   CREATININE 1.0 02/15/2017   CREATININE 0.82 10/09/2016 1425   CALCIUM 8.8 (L) 10/09/2016 1425   PROT 6.4 (L) 10/09/2016 1425   ALBUMIN 3.4 (L) 10/09/2016 1425   AST 27 02/15/2017   ALT 15 02/15/2017   ALKPHOS 112 02/15/2017   BILITOT 0.5 10/09/2016 1425   GFRNONAA >60 10/09/2016 1425   GFRAA >60 10/09/2016 1425   Recent Labs    10/09/16 1425 01/16/17 02/15/17  NA 141 145 144  K 4.6 3.6 4.4  CL 106  --   --   CO2 26  --   --   GLUCOSE 97  --   --   BUN 13 19 16   CREATININE 0.82 0.9 1.0  CALCIUM 8.8*  --   --    Recent Labs    10/09/16 1425 01/16/17 02/15/17  AST 19 18 27   ALT 12* 9 15  ALKPHOS 73 85 112  BILITOT 0.5  --   --   PROT 6.4*  --   --   ALBUMIN 3.4*  --   --    Recent Labs     10/09/16 1425 01/16/17 02/15/17  WBC 6.6 5.9 4.6  NEUTROABS 5.0  --   --   HGB 11.7* 12.2 14.1  HCT 36.1 37 43  MCV 91.2  --   --   PLT 156 180 239   Recent Labs    01/16/17  CHOL 166  LDLCALC 97  TRIG 100   No results found for: MICROALBUR Lab Results  Component Value Date   TSH 0.39 (A) 02/15/2017   Lab Results  Component Value Date   HGBA1C 5.7 01/16/2017   Lab Results  Component Value Date   CHOL 166 01/16/2017   HDL 49 01/16/2017   LDLCALC 97 01/16/2017   TRIG 100 01/16/2017   CHOLHDL 3.2 06/22/2013    Significant Diagnostic Results in last 30 days:  No results found.  Assessment and Plan  Diabetes mellitus, controlled  Pending, last A1c 5.7, good control; continue Glucophage 24 hour 500 mg by mouth daily, and Cozaar 25 mg by mouth daily  Peripheral sensory neuropathy due to type 2 diabetes mellitus Controlled; continue Neurontin 300 mg by mouth daily at bedtime  GERD (gastroesophageal reflux disease) No complaints; continue omeprazole 20 mg by mouth daily     Nickole Adamek D. Lyn Hollingshead, MD

## 2017-07-27 DIAGNOSIS — E785 Hyperlipidemia, unspecified: Secondary | ICD-10-CM | POA: Diagnosis not present

## 2017-07-27 DIAGNOSIS — E119 Type 2 diabetes mellitus without complications: Secondary | ICD-10-CM | POA: Diagnosis not present

## 2017-07-27 DIAGNOSIS — E039 Hypothyroidism, unspecified: Secondary | ICD-10-CM | POA: Diagnosis not present

## 2017-07-27 DIAGNOSIS — I10 Essential (primary) hypertension: Secondary | ICD-10-CM | POA: Diagnosis not present

## 2017-07-27 LAB — CBC AND DIFFERENTIAL
HEMATOCRIT: 33 — AB (ref 36–46)
HEMOGLOBIN: 11.3 — AB (ref 12.0–16.0)
Platelets: 206 (ref 150–399)
WBC: 5.2

## 2017-07-27 LAB — BASIC METABOLIC PANEL
BUN: 20 (ref 4–21)
Creatinine: 1 (ref 0.5–1.1)
GLUCOSE: 101
Potassium: 3.9 (ref 3.4–5.3)
SODIUM: 144 (ref 137–147)

## 2017-07-27 LAB — LIPID PANEL
Cholesterol: 160 (ref 0–200)
HDL: 58 (ref 35–70)
LDL CALC: 86
Triglycerides: 79 (ref 40–160)

## 2017-07-27 LAB — HEPATIC FUNCTION PANEL
ALT: 13 (ref 7–35)
AST: 21 (ref 13–35)
Alkaline Phosphatase: 99 (ref 25–125)
BILIRUBIN, TOTAL: 0.3

## 2017-07-27 LAB — HEMOGLOBIN A1C: Hemoglobin A1C: 6.2

## 2017-07-27 LAB — TSH: TSH: 0.36 — AB (ref 0.41–5.90)

## 2017-07-29 ENCOUNTER — Encounter: Payer: Self-pay | Admitting: Internal Medicine

## 2017-07-29 NOTE — Assessment & Plan Note (Signed)
  Pending, last A1c 5.7, good control; continue Glucophage 24 hour 500 mg by mouth daily, and Cozaar 25 mg by mouth daily

## 2017-07-29 NOTE — Assessment & Plan Note (Signed)
Controlled; continue Neurontin 300 mg by mouth daily at bedtime

## 2017-07-29 NOTE — Assessment & Plan Note (Signed)
No complaints; continue omeprazole 20 mg by mouth daily

## 2017-08-01 DIAGNOSIS — E559 Vitamin D deficiency, unspecified: Secondary | ICD-10-CM | POA: Diagnosis not present

## 2017-08-02 DIAGNOSIS — R1312 Dysphagia, oropharyngeal phase: Secondary | ICD-10-CM | POA: Diagnosis not present

## 2017-08-22 ENCOUNTER — Non-Acute Institutional Stay (SKILLED_NURSING_FACILITY): Payer: Medicare Other | Admitting: Internal Medicine

## 2017-08-22 ENCOUNTER — Encounter: Payer: Self-pay | Admitting: Internal Medicine

## 2017-08-22 DIAGNOSIS — I5032 Chronic diastolic (congestive) heart failure: Secondary | ICD-10-CM

## 2017-08-22 DIAGNOSIS — D508 Other iron deficiency anemias: Secondary | ICD-10-CM

## 2017-08-22 DIAGNOSIS — N182 Chronic kidney disease, stage 2 (mild): Secondary | ICD-10-CM

## 2017-08-22 NOTE — Progress Notes (Signed)
Location:  [facility] Room Number: 201-D Place of Service:  SNF (31)  [name], Acea Yagi D, MD  Patient Care Team: [name], Imo Cumbie D, MD as PCP - General (Internal Medicine)  Extended Emergency Contact Information Primary Emergency Contact: Berling,[name] Address: [address]          [place], [place] [address] [place] Home Phone: [phone] Mobile Phone: [phone] Relation: Sister Secondary Emergency Contact: [name],[name] Address: [address]          [place], [place] [address] [place] Home Phone: [phone] Relation: Grandaughter    Allergies: Tequin [gatifloxacin]  Chief Complaint  Patient presents with  . Medical Management of Chronic Issues    Routine visit    HPI: Patient is 84 y.o. female who   Past Medical History:  Diagnosis Date  . Breast mass, right 12/13/2013  . Chronic diastolic CHF (congestive heart failure) (HCC) 07/19/2013  . Chronic diastolic heart failure (HCC)   . Chronic kidney disease, stage II (mild)   . Chronic renal disease, stage 2, mildly decreased glomerular filtration rate (GFR) between 60-89 mL/min/1.73 square meter 09/03/2014  . Closed T12 fracture (HCC) 07/16/2013  . Depression 07/18/2013  . Diabetes mellitus   . Diabetes mellitus due to underlying condition (HCC) 06/21/2013  . Diabetes mellitus without complication (HCC)   . Dysphagia, oropharyngeal phase   . GERD (gastroesophageal reflux disease) 12/17/2014  . Gout   . Hypertension   . Hypertensive heart disease with congestive heart failure (HCC) 06/21/2013  . Iron deficiency anemia 09/03/2014  . Iron deficiency anemia, unspecified   . Leukocytosis 07/18/2013  . Lumbago 10/23/2013  . Lung nodule < 6cm on CT 11/13/2016  . Osteopenia 07/16/2013  . Syncope 06/21/2013  . Type 2 diabetes, controlled, with neuropathy (HCC) 10/18/2013  . Unspecified constipation 10/18/2013  . Vitamin D deficiency 04/11/2016    Past  Surgical History:  Procedure Laterality Date  . NO PAST SURGERIES      Allergies as of 08/22/2017      Reactions   Tequin [gatifloxacin]       Medication List        Accurate as of 08/22/17  4:20 PM. Always use your most recent med list.          ARTIFICIAL TEARS OP Place 1 drop into both eyes 4 (four) times daily.   aspirin 81 MG chewable tablet Chew 1 tablet (81 mg total) by mouth daily.   ferrous sulfate 325 (65 FE) MG tablet Take 325 mg by mouth daily with breakfast.   furosemide 40 MG tablet Commonly known as:  LASIX Take 40 mg by mouth every morning.   gabapentin 300 MG capsule Commonly known as:  NEURONTIN Take 1 capsule (300 mg total) by mouth at bedtime.   ipratropium-albuterol 0.5-2.5 (3) MG/3ML Soln Commonly known as:  DUONEB Take 3 mLs by nebulization every 6 (six) hours as needed.   lidocaine 5 % Commonly known as:  LIDODERM Place 1 patch onto the skin daily. Apply to right knee and lumbar Remove & Discard patch within 12 hours or as directed by MD   losartan 25 MG tablet Commonly known as:  COZAAR Take 25 mg by mouth every morning. For HTN   metFORMIN 500 MG 24 hr tablet Commonly known as:  GLUCOPHAGE-XR Take 500 mg by mouth every evening. For diabetes   NUTRITIONAL SUPPLEMENT Liqd Take by mouth. Magic cup-take one cup with dinner as supplement  omeprazole 20 MG capsule Commonly known as:  PRILOSEC Take 20 mg by mouth daily. For GERD   potassium chloride 10 MEQ tablet Commonly known as:  K-DUR Take 10 mEq by mouth. Take 3 tablets (30 meq) daily for potassium supplement   traMADol 50 MG tablet Commonly known as:  ULTRAM Take 50 mg by mouth 2 (two) times daily.       No orders of the defined types were placed in this encounter.   Immunization History  Administered Date(s) Administered  . Influenza-Unspecified 03/23/2015, 04/06/2016, 04/06/2017  . PPD Test 08/12/2013  . Pneumococcal Polysaccharide-23 06/23/2013    Social History    Tobacco Use  . Smoking status: Former Games developer  . Smokeless tobacco: Never Used  Substance Use Topics  . Alcohol use: No    Review of Systems  DATA OBTAINED: from patient, nurse, medical record, family member GENERAL:  no fevers, fatigue, appetite changes SKIN: No itching, rash HEENT: No complaint RESPIRATORY: No cough, wheezing, SOB CARDIAC: No chest pain, palpitations, lower extremity edema  GI: No abdominal pain, No N/V/D or constipation, No heartburn or reflux  GU: No dysuria, frequency or urgency, or incontinence  MUSCULOSKELETAL: No unrelieved bone/joint pain NEUROLOGIC: No headache, dizziness  PSYCHIATRIC: No overt anxiety or sadness  Vitals:   08/22/17 1618  BP: 130/67  Pulse: 75  Resp: 16  Temp: (!) 95 F (35 C)  SpO2: 98%   Body mass index is 20.43 kg/m. Physical Exam  GENERAL APPEARANCE: Alert, conversant, No acute distress  SKIN: No diaphoresis rash HEENT: Unremarkable RESPIRATORY: Breathing is even, unlabored. Lung sounds are clear   CARDIOVASCULAR: Heart RRR no murmurs, rubs or gallops. No peripheral edema  GASTROINTESTINAL: Abdomen is soft, non-tender, not distended w/ normal bowel sounds.  GENITOURINARY: Bladder non tender, not distended  MUSCULOSKELETAL: No abnormal joints or musculature NEUROLOGIC: Cranial nerves 2-12 grossly intact. Moves all extremities PSYCHIATRIC: Mood and affect appropriate to situation, no behavioral issues  Patient Active Problem List   Diagnosis Date Noted  . Lung nodule < 6cm on CT 11/13/2016  . Vitamin D deficiency 04/11/2016  . Hyperlipidemia 02/11/2016  . Pain 11/07/2015  . Wheezing 11/07/2015  . Edema 10/25/2015  . Conjunctivitis 07/09/2015  . UTI (urinary tract infection) 03/24/2015  . GERD (gastroesophageal reflux disease) 12/17/2014  . Hypokalemia 12/17/2014  . Depression 11/25/2014  . Iron deficiency anemia 09/03/2014  . Chronic renal disease, stage 2, mildly decreased glomerular filtration rate (GFR)  between 60-89 mL/min/1.73 square meter 09/03/2014  . Pain in joint, lower leg 08/21/2014  . Cough 01/27/2014  . Dizziness 01/12/2014  . Contusion of unspecified site 01/12/2014  . Breast mass, right 12/13/2013  . Lumbago 10/23/2013  . Type 2 diabetes, controlled, with neuropathy (HCC) 10/18/2013  . 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 Unspecified constipation 10/18/2013  . Plantar fasciitis, left 07/29/2013  . Physical deconditioning 07/21/2013  . Diabetes mellitus, controlled (HCC) 07/19/2013  . Chronic diastolic CHF (congestive heart failure) (HCC) 07/19/2013  . HCAP (healthcare-associated pneumonia) 07/18/2013  . Chronic diastolic heart failure (HCC) 06/24/2013  . Syncope 06/21/2013  . Syncope and collapse 06/21/2013  . Leukocytosis 06/21/2013  . Hypertensive heart disease with congestive heart failure (HCC) 06/21/2013  . Diabetes mellitus due to underlying condition (HCC) 06/21/2013    CMP     Component Value Date/Time   NA 144 07/27/2017   K 3.9 07/27/2017   CL 106 10/09/2016 1425   CO2 26 10/09/2016 1425  GLUCOSE 97 10/09/2016 1425   BUN 20 07/27/2017   CREATININE 1.0 07/27/2017   CREATININE 0.82 10/09/2016 1425   CALCIUM 8.8 (L) 10/09/2016 1425   PROT 6.4 (L) 10/09/2016 1425   ALBUMIN 3.4 (L) 10/09/2016 1425   AST 21 07/27/2017   ALT 13 07/27/2017   ALKPHOS 99 07/27/2017   BILITOT 0.5 10/09/2016 1425   GFRNONAA >60 10/09/2016 1425   GFRAA >60 10/09/2016 1425   Recent Labs    10/09/16 1425 01/16/17 02/15/17 07/27/17  NA 141 145 144 144  K 4.6 3.6 4.4 3.9  CL 106  --   --   --   CO2 26  --   --   --   GLUCOSE 97  --   --   --   BUN 13 19 16 20   CREATININE 0.82 0.9 1.0 1.0  CALCIUM 8.8*  --   --   --    Recent Labs    10/09/16 1425 01/16/17 02/15/17 07/27/17  AST 19 18 27 21   ALT 12* 9 15 13   ALKPHOS 73 85 112 99  BILITOT 0.5  --   --   --   PROT 6.4*  --   --   --   ALBUMIN 3.4*  --   --   --     Recent Labs    10/09/16 1425 01/16/17 02/15/17 07/27/17  WBC 6.6 5.9 4.6 5.2  NEUTROABS 5.0  --   --   --   HGB 11.7* 12.2 14.1 11.3*  HCT 36.1 37 43 33*  MCV 91.2  --   --   --   PLT 156 180 239 206   Recent Labs    01/16/17 07/27/17  CHOL 166 160  LDLCALC 97 86  TRIG 100 79   No results found for: MICROALBUR Lab Results  Component Value Date   TSH 0.36 (A) 07/27/2017   Lab Results  Component Value Date   HGBA1C 6.2 07/27/2017   Lab Results  Component Value Date   CHOL 160 07/27/2017   HDL 58 07/27/2017   LDLCALC 86 07/27/2017   TRIG 79 07/27/2017   CHOLHDL 3.2 06/22/2013    Significant Diagnostic Results in last 30 days:  No results found.  Assessment and P  Labs/tests ordered:   Dyspnea with small valuables is not compatible with my note which is why I had to create my note; as I was unable to cancel this note I had to place my note on top of it Merrilee Seashore, MD

## 2017-08-26 ENCOUNTER — Encounter: Payer: Self-pay | Admitting: Internal Medicine

## 2017-08-26 NOTE — Assessment & Plan Note (Signed)
Recent hemoglobin is 11.3 which is decreased from prior but stable compared to a year ago; will continue iron 325 mg by mouth daily

## 2017-08-26 NOTE — Assessment & Plan Note (Signed)
No exacerbations; patient remains stable on Lasix 40 mg daily and Cozaar 25 mg daily

## 2017-08-26 NOTE — Assessment & Plan Note (Signed)
No recent GFR but creatinine is 1.0 which is stable from prior; we'll monitor intervals

## 2017-08-26 NOTE — Progress Notes (Signed)
Location:  [facility] Room Number: 201-D Place of Service:  SNF (31)  [name], MD  [name], MD as PCP - General (Internal Medicine)  Extended Emergency Contact Information Primary Emergency Contact: Bruhl,[name] Address: [address]          [place], [place] [address] [name] Home Phone: [phone] Mobile Phone: [phone] Relation: Sister Secondary Emergency Contact: [name],[name] Address: [address]          [place], [place] [address] [name] of [place] Home Phone: [phone] Relation: Grandaughter    Allergies: Tequin [gatifloxacin]  Chief Complaint  Patient presents with  . Medical Management of Chronic Issues    Routine visit    HPI: Patient is 84 y.o. female who is being seen for routine issues of iron deficiency anemia, chronic kidney disease stage II and chronic congestive heart failure.  Past Medical History:  Diagnosis Date  . Breast mass, right 12/13/2013  . Chronic diastolic CHF (congestive heart failure) (HCC) 07/19/2013  . Chronic diastolic heart failure (HCC)   . Chronic kidney disease, stage II (mild)   . Chronic renal disease, stage 2, mildly decreased glomerular filtration rate (GFR) between 60-89 mL/min/1.73 square meter 09/03/2014  . Closed T12 fracture (HCC) 07/16/2013  . Depression 07/18/2013  . Diabetes mellitus   . Diabetes mellitus due to underlying condition (HCC) 06/21/2013  . Diabetes mellitus without complication (HCC)   . Dysphagia, oropharyngeal phase   . GERD (gastroesophageal reflux disease) 12/17/2014  . Gout   . Hypertension   . Hypertensive heart disease with congestive heart failure (HCC) 06/21/2013  . Iron deficiency anemia 09/03/2014  . Iron deficiency anemia, unspecified   . Leukocytosis 07/18/2013  . Lumbago 10/23/2013  . Lung nodule < 6cm on CT 11/13/2016  . Osteopenia 07/16/2013  . Syncope 06/21/2013  . Type 2 diabetes,  controlled, with neuropathy (HCC) 10/18/2013  . Unspecified constipation 10/18/2013  . Vitamin D deficiency 04/11/2016    Past Surgical History:  Procedure Laterality Date  . NO PAST SURGERIES      Allergies as of 08/22/2017      Reactions   Tequin [gatifloxacin]       Medication List        Accurate as of 08/22/17 11:59 PM. Always use your most recent med list.          ARTIFICIAL TEARS OP Place 1 drop into both eyes 4 (four) times daily.   aspirin 81 MG chewable tablet Chew 1 tablet (81 mg total) by mouth daily.   ferrous sulfate 325 (65 FE) MG tablet Take 325 mg by mouth daily with breakfast.   furosemide 40 MG tablet Commonly known as:  LASIX Take 40 mg by mouth every morning.   gabapentin 300 MG capsule Commonly known as:  NEURONTIN Take 1 capsule (300 mg total) by mouth at bedtime.   ipratropium-albuterol 0.5-2.5 (3) MG/3ML Soln Commonly known as:  DUONEB Take 3 mLs by nebulization every 6 (six) hours as needed.   lidocaine 5 % Commonly known as:  LIDODERM Place 1 patch onto the skin daily. Apply to right knee and lumbar Remove & Discard patch within 12 hours or as directed by MD   losartan 25 MG tablet Commonly known as:  COZAAR Take 25 mg by mouth every morning. For HTN   metFORMIN 500 MG 24 hr tablet Commonly known as:  GLUCOPHAGE-XR Take 500 mg by mouth every evening. For  diabetes   NUTRITIONAL SUPPLEMENT Liqd Take by mouth. Magic cup-take one cup with dinner as supplement   omeprazole 20 MG capsule Commonly known as:  PRILOSEC Take 20 mg by mouth daily. For GERD   potassium chloride 10 MEQ tablet Commonly known as:  K-DUR Take 10 mEq by mouth. Take 3 tablets (30 meq) daily for potassium supplement   traMADol 50 MG tablet Commonly known as:  ULTRAM Take 50 mg by mouth 2 (two) times daily.       No orders of the defined types were placed in this encounter.   Immunization History  Administered Date(s) Administered  .  Influenza-Unspecified 03/23/2015, 04/06/2016, 04/06/2017  . PPD Test 08/12/2013  . Pneumococcal Polysaccharide-23 06/23/2013    Social History   Tobacco Use  . Smoking status: Former Games developermoker  . Smokeless tobacco: Never Used  Substance Use Topics  . Alcohol use: No    Review of Systems  DATA OBTAINED: from nurse GENERAL:  no fevers, fatigue, appetite changes SKIN: No itching, rash HEENT: No complaint RESPIRATORY: No cough, wheezing, SOB CARDIAC: No chest pain, palpitations, lower extremity edema  GI: No abdominal pain, No N/V/D or constipation, No heartburn or reflux  GU: No dysuria, frequency or urgency, or incontinence  MUSCULOSKELETAL: No unrelieved bone/joint pain NEUROLOGIC: No headache, dizziness  PSYCHIATRIC: No overt anxiety or sadness  Vitals:   08/22/17 1618  BP: 130/67  Pulse: 75  Resp: 16  Temp: (!) 95 F (35 C)  SpO2: 98%   Body mass index is 20.43 kg/m. Physical Exam  GENERAL APPEARANCE: Alert, minimally conversant, No acute distress  SKIN: No diaphoresis rash HEENT: Unremarkable RESPIRATORY: Breathing is even, unlabored. Lung sounds are clear   CARDIOVASCULAR: Heart RRR no murmurs, rubs or gallops. No peripheral edema  GASTROINTESTINAL: Abdomen is soft, non-tender, not distended w/ normal bowel sounds.  GENITOURINARY: Bladder non tender, not distended  MUSCULOSKELETAL: No abnormal joints or musculature NEUROLOGIC: Cranial nerves 2-12 grossly intact. Moves all extremities PSYCHIATRIC: Mood and affect appropriate to situation, no behavioral issues  Patient Active Problem List   Diagnosis Date Noted  . Lung nodule < 6cm on CT 11/13/2016  . Vitamin D deficiency 04/11/2016  . Hyperlipidemia 02/11/2016  . Pain 11/07/2015  . Wheezing 11/07/2015  . Edema 10/25/2015  . Conjunctivitis 07/09/2015  . UTI (urinary tract infection) 03/24/2015  . GERD (gastroesophageal reflux disease) 12/17/2014  . Hypokalemia 12/17/2014  . Depression 11/25/2014  . Iron  deficiency anemia 09/03/2014  . Chronic renal disease, stage 2, mildly decreased glomerular filtration rate (GFR) between 60-89 mL/min/1.73 square meter 09/03/2014  . Pain in joint, lower leg 08/21/2014  . Cough 01/27/2014  . Dizziness 01/12/2014  . Contusion of unspecified site 01/12/2014  . Breast mass, right 12/13/2013  . Lumbago 10/23/2013  . Type 2 diabetes, controlled, with neuropathy (HCC) 10/18/2013  . 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 Unspecified constipation 10/18/2013  . Plantar fasciitis, left 07/29/2013  . Physical deconditioning 07/21/2013  . Diabetes mellitus, controlled (HCC) 07/19/2013  . Chronic diastolic CHF (congestive heart failure) (HCC) 07/19/2013  . HCAP (healthcare-associated pneumonia) 07/18/2013  . Chronic diastolic heart failure (HCC) 06/24/2013  . Syncope 06/21/2013  . Syncope and collapse 06/21/2013  . Leukocytosis 06/21/2013  . Hypertensive heart disease with congestive heart failure (HCC) 06/21/2013  . Diabetes mellitus due to underlying condition (HCC) 06/21/2013    CMP     Component Value Date/Time   NA 144 07/27/2017   K 3.9  07/27/2017   CL 106 10/09/2016 1425   CO2 26 10/09/2016 1425   GLUCOSE 97 10/09/2016 1425   BUN 20 07/27/2017   CREATININE 1.0 07/27/2017   CREATININE 0.82 10/09/2016 1425   CALCIUM 8.8 (L) 10/09/2016 1425   PROT 6.4 (L) 10/09/2016 1425   ALBUMIN 3.4 (L) 10/09/2016 1425   AST 21 07/27/2017   ALT 13 07/27/2017   ALKPHOS 99 07/27/2017   BILITOT 0.5 10/09/2016 1425   GFRNONAA >60 10/09/2016 1425   GFRAA >60 10/09/2016 1425   Recent Labs    10/09/16 1425 01/16/17 02/15/17 07/27/17  NA 141 145 144 144  K 4.6 3.6 4.4 3.9  CL 106  --   --   --   CO2 26  --   --   --   GLUCOSE 97  --   --   --   BUN 13 19 16 20   CREATININE 0.82 0.9 1.0 1.0  CALCIUM 8.8*  --   --   --    Recent Labs    10/09/16 1425 01/16/17 02/15/17 07/27/17  AST 19 18 27 21   ALT 12* 9 15 13     ALKPHOS 73 85 112 99  BILITOT 0.5  --   --   --   PROT 6.4*  --   --   --   ALBUMIN 3.4*  --   --   --    Recent Labs    10/09/16 1425 01/16/17 02/15/17 07/27/17  WBC 6.6 5.9 4.6 5.2  NEUTROABS 5.0  --   --   --   HGB 11.7* 12.2 14.1 11.3*  HCT 36.1 37 43 33*  MCV 91.2  --   --   --   PLT 156 180 239 206   Recent Labs    01/16/17 07/27/17  CHOL 166 160  LDLCALC 97 86  TRIG 100 79   No results found for: MICROALBUR Lab Results  Component Value Date   TSH 0.36 (A) 07/27/2017   Lab Results  Component Value Date   HGBA1C 6.2 07/27/2017   Lab Results  Component Value Date   CHOL 160 07/27/2017   HDL 58 07/27/2017   LDLCALC 86 07/27/2017   TRIG 79 07/27/2017   CHOLHDL 3.2 06/22/2013    Significant Diagnostic Results in last 30 days:  No results found.  Assessment and Plan  Iron deficiency anemia Recent hemoglobin is 11.3 which is decreased from prior but stable compared to a year ago; will continue iron 325 mg by mouth daily  Chronic renal disease, stage 2, mildly decreased glomerular filtration rate (GFR) between 60-89 mL/min/1.73 square meter No recent GFR but creatinine is 1.0 which is stable from prior; we'll monitor intervals  Chronic diastolic CHF (congestive heart failure) No exacerbations; patient remains stable on Lasix 40 mg daily and Cozaar 25 mg daily     Merrilee Seashore, MD

## 2017-08-29 DIAGNOSIS — D559 Anemia due to enzyme disorder, unspecified: Secondary | ICD-10-CM | POA: Diagnosis not present

## 2017-08-29 DIAGNOSIS — E559 Vitamin D deficiency, unspecified: Secondary | ICD-10-CM | POA: Diagnosis not present

## 2017-09-14 ENCOUNTER — Encounter: Payer: Self-pay | Admitting: Internal Medicine

## 2017-09-14 ENCOUNTER — Non-Acute Institutional Stay (SKILLED_NURSING_FACILITY): Payer: Medicare Other | Admitting: Internal Medicine

## 2017-09-14 DIAGNOSIS — I5022 Chronic systolic (congestive) heart failure: Secondary | ICD-10-CM | POA: Diagnosis not present

## 2017-09-14 DIAGNOSIS — E785 Hyperlipidemia, unspecified: Secondary | ICD-10-CM | POA: Diagnosis not present

## 2017-09-14 DIAGNOSIS — E1140 Type 2 diabetes mellitus with diabetic neuropathy, unspecified: Secondary | ICD-10-CM

## 2017-09-14 DIAGNOSIS — I110 Hypertensive heart disease with heart failure: Secondary | ICD-10-CM | POA: Diagnosis not present

## 2017-09-17 ENCOUNTER — Encounter: Payer: Self-pay | Admitting: Internal Medicine

## 2017-09-17 NOTE — Assessment & Plan Note (Signed)
LDL 86, HDL 58, well cont on  No meds; will monitor at intervals

## 2017-09-17 NOTE — Assessment & Plan Note (Signed)
A1c is 6.2 on glucophage XR 500 mg daily, great control;pt is on ARB, lipids are good without statin

## 2017-09-17 NOTE — Progress Notes (Signed)
Location:  [facility] Room Number: 201 Place of Service:  SNF (31)  [name], Artie Mcintyre D, MD  Patient Care Team: [name], Javaughn Opdahl D, MD as PCP - General (Internal Medicine)  Extended Emergency Contact Information Primary Emergency Contact: Lerman,[name] Address: [address]          [place], [place] [address] [place] Home Phone: [phone] Mobile Phone: [phone] Relation: Sister Secondary Emergency Contact: [name],[name] Address: [address]          [place], [place] [address] [place] Home Phone: [phone] Relation: Grandaughter    Allergies: Tequin [gatifloxacin]  Chief Complaint  Patient presents with  . Medical Management of Chronic Issues    HPI: Patient is 84 y.o. female who  is being seen for routine issues of hypertension, hyperlipidemia, and diabetes mellitus type 2.  Past Medical History:  Diagnosis Date  . Breast mass, right 12/13/2013  . Chronic diastolic CHF (congestive heart failure) (HCC) 07/19/2013  . Chronic diastolic heart failure (HCC)   . Chronic kidney disease, stage II (mild)   . Chronic renal disease, stage 2, mildly decreased glomerular filtration rate (GFR) between 60-89 mL/min/1.73 square meter 09/03/2014  . Closed T12 fracture (HCC) 07/16/2013  . Depression 07/18/2013  . Diabetes mellitus   . Diabetes mellitus due to underlying condition (HCC) 06/21/2013  . Diabetes mellitus without complication (HCC)   . Dysphagia, oropharyngeal phase   . GERD (gastroesophageal reflux disease) 12/17/2014  . Gout   . Hypertension   . Hypertensive heart disease with congestive heart failure (HCC) 06/21/2013  . Iron deficiency anemia 09/03/2014  . Iron deficiency anemia, unspecified   . Leukocytosis 07/18/2013  . Lumbago 10/23/2013  . Lung nodule < 6cm on CT 11/13/2016  . Osteopenia 07/16/2013  . Syncope 06/21/2013  . Type 2 diabetes, controlled, with neuropathy (HCC) 10/18/2013  .  Unspecified constipation 10/18/2013  . Vitamin D deficiency 04/11/2016    Past Surgical History:  Procedure Laterality Date  . NO PAST SURGERIES      Allergies as of 09/14/2017      Reactions   Tequin [gatifloxacin]       Medication List        Accurate as of 09/14/17 11:59 PM. Always use your most recent med list.          aspirin 81 MG chewable tablet Chew 1 tablet (81 mg total) by mouth daily.   ferrous sulfate 325 (65 FE) MG tablet Take 325 mg by mouth daily with breakfast.   furosemide 40 MG tablet Commonly known as:  LASIX Take 40 mg by mouth every morning.   gabapentin 300 MG capsule Commonly known as:  NEURONTIN Take 1 capsule (300 mg total) by mouth at bedtime.   ipratropium-albuterol 0.5-2.5 (3) MG/3ML Soln Commonly known as:  DUONEB Take 3 mLs by nebulization every 6 (six) hours as needed.   lidocaine 5 % Commonly known as:  LIDODERM Place 1 patch onto the skin daily. Apply to right knee and lumbar Remove & Discard patch within 12 hours or as directed by MD   losartan 25 MG tablet Commonly known as:  COZAAR Take 25 mg by mouth every morning. For HTN   metFORMIN 500 MG 24 hr tablet Commonly known as:  GLUCOPHAGE-XR Take 500 mg by mouth every evening. For diabetes   NUTRITIONAL SUPPLEMENT Liqd Take by mouth. Magic cup-take one cup with dinner as supplement   omeprazole 20 MG capsule Commonly known  as:  PRILOSEC Take 20 mg by mouth daily. For GERD   potassium chloride 10 MEQ tablet Commonly known as:  K-DUR Take 10 mEq by mouth. Take 3 tablets (30 meq) daily for potassium supplement   traMADol 50 MG tablet Commonly known as:  ULTRAM Take 50 mg by mouth 2 (two) times daily.       No orders of the defined types were placed in this encounter.   Immunization History  Administered Date(s) Administered  . Influenza-Unspecified 03/23/2015, 04/06/2016, 04/06/2017  . PPD Test 08/12/2013  . Pneumococcal Polysaccharide-23 06/23/2013    Social  History   Tobacco Use  . Smoking status: Former Games developer  . Smokeless tobacco: Never Used  Substance Use Topics  . Alcohol use: No    Review of Systems  DATA OBTAINED: from  nursing GENERAL:  no fevers, fatigue, appetite changes SKIN: No itching, rash HEENT: No complaint RESPIRATORY: No cough, wheezing, SOB CARDIAC: No chest pain, palpitations, lower extremity edema  GI: No abdominal pain, No N/V/D or constipation, No heartburn or reflux  GU: No dysuria, frequency or urgency, or incontinence  MUSCULOSKELETAL: No unrelieved bone/joint pain NEUROLOGIC: No headache, dizziness  PSYCHIATRIC: No overt anxiety or sadness  Vitals:   09/14/17 0959  BP: 130/65  Pulse: 77  Resp: 17  Temp: (!) 97.1 F (36.2 C)   Body mass index is 20.3 kg/m. Physical Exam  GENERAL APPEARANCE: Alert, nonwith dementia conversant, No acute distress  SKIN: No diaphoresis rash HEENT: Unremarkable RESPIRATORY: Breathing is even, unlabored. Lung sounds are clear   CARDIOVASCULAR: Heart RRR no murmurs, rubs or gallops. No peripheral edema  GASTROINTESTINAL: Abdomen is soft, non-tender, not distended w/ normal bowel sounds.  GENITOURINARY: Bladder non tender, not distended  MUSCULOSKELETAL: No abnormal joints or musculature NEUROLOGIC: Cranial nerves 2-12 grossly intact. Moves all extremities PSYCHIATRIC: Mood and affect appropriate to situation with dementia, no behavioral issues  Patient Active Problem List   Diagnosis Date Noted  . Lung nodule < 6cm on CT 11/13/2016  . Vitamin D deficiency 04/11/2016  . Hyperlipidemia 02/11/2016  . Pain 11/07/2015  . Wheezing 11/07/2015  . Edema 10/25/2015  . Conjunctivitis 07/09/2015  . UTI (urinary tract infection) 03/24/2015  . GERD (gastroesophageal reflux disease) 12/17/2014  . Hypokalemia 12/17/2014  . Depression 11/25/2014  . Iron deficiency anemia 09/03/2014  . Chronic renal disease, stage 2, mildly decreased glomerular filtration rate (GFR) between  60-89 mL/min/1.73 square meter 09/03/2014  . Pain in joint, lower leg 08/21/2014  . Cough 01/27/2014  . Dizziness 01/12/2014  . Contusion of unspecified site 01/12/2014  . Breast mass, right 12/13/2013  . Lumbago 10/23/2013  . Type 2 diabetes, controlled, with neuropathy (HCC) 10/18/2013  . 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 Unspecified constipation 10/18/2013  . Plantar fasciitis, left 07/29/2013  . Physical deconditioning 07/21/2013  . Diabetes mellitus, controlled (HCC) 07/19/2013  . Chronic diastolic CHF (congestive heart failure) (HCC) 07/19/2013  . HCAP (healthcare-associated pneumonia) 07/18/2013  . Chronic diastolic heart failure (HCC) 06/24/2013  . Syncope 06/21/2013  . Syncope and collapse 06/21/2013  . Leukocytosis 06/21/2013  . Hypertensive heart disease with congestive heart failure (HCC) 06/21/2013  . Diabetes mellitus due to underlying condition (HCC) 06/21/2013    CMP     Component Value Date/Time   NA 144 07/27/2017   K 3.9 07/27/2017   CL 106 10/09/2016 1425   CO2 26 10/09/2016 1425   GLUCOSE 97 10/09/2016 1425   BUN 20 07/27/2017  CREATININE 1.0 07/27/2017   CREATININE 0.82 10/09/2016 1425   CALCIUM 8.8 (L) 10/09/2016 1425   PROT 6.4 (L) 10/09/2016 1425   ALBUMIN 3.4 (L) 10/09/2016 1425   AST 21 07/27/2017   ALT 13 07/27/2017   ALKPHOS 99 07/27/2017   BILITOT 0.5 10/09/2016 1425   GFRNONAA >60 10/09/2016 1425   GFRAA >60 10/09/2016 1425   Recent Labs    10/09/16 1425 01/16/17 02/15/17 07/27/17  NA 141 145 144 144  K 4.6 3.6 4.4 3.9  CL 106  --   --   --   CO2 26  --   --   --   GLUCOSE 97  --   --   --   BUN 13 19 16 20   CREATININE 0.82 0.9 1.0 1.0  CALCIUM 8.8*  --   --   --    Recent Labs    10/09/16 1425 01/16/17 02/15/17 07/27/17  AST 19 18 27 21   ALT 12* 9 15 13   ALKPHOS 73 85 112 99  BILITOT 0.5  --   --   --   PROT 6.4*  --   --   --   ALBUMIN 3.4*  --   --   --    Recent Labs      10/09/16 1425 01/16/17 02/15/17 07/27/17  WBC 6.6 5.9 4.6 5.2  NEUTROABS 5.0  --   --   --   HGB 11.7* 12.2 14.1 11.3*  HCT 36.1 37 43 33*  MCV 91.2  --   --   --   PLT 156 180 239 206   Recent Labs    01/16/17 07/27/17  CHOL 166 160  LDLCALC 97 86  TRIG 100 79   No results found for: MICROALBUR Lab Results  Component Value Date   TSH 0.36 (A) 07/27/2017   Lab Results  Component Value Date   HGBA1C 6.2 07/27/2017   Lab Results  Component Value Date   CHOL 160 07/27/2017   HDL 58 07/27/2017   LDLCALC 86 07/27/2017   TRIG 79 07/27/2017   CHOLHDL 3.2 06/22/2013    Significant Diagnostic Results in last 30 days:  No results found.  Assessment and Plan  Hypertensive heart disease with congestive heart failure Stable; continue Cozaar 25 mg daily and Lasix 40 mg daily  Hyperlipidemia LDL 86, HDL 58, well cont on  No meds; will monitor at intervals   Type 2 diabetes, controlled, with neuropathy A1c is 6.2 on glucophage XR 500 mg daily, great control;pt is on ARB, lipids are good without statin    Merrilee Seashore, MD

## 2017-09-17 NOTE — Assessment & Plan Note (Signed)
Stable; continue Cozaar 25 mg daily and Lasix 40 mg daily

## 2017-10-17 ENCOUNTER — Encounter: Payer: Self-pay | Admitting: Internal Medicine

## 2017-10-17 ENCOUNTER — Non-Acute Institutional Stay (SKILLED_NURSING_FACILITY): Payer: Medicare Other | Admitting: Internal Medicine

## 2017-10-17 DIAGNOSIS — L84 Corns and callosities: Secondary | ICD-10-CM | POA: Diagnosis not present

## 2017-10-17 DIAGNOSIS — B351 Tinea unguium: Secondary | ICD-10-CM | POA: Diagnosis not present

## 2017-10-17 DIAGNOSIS — K219 Gastro-esophageal reflux disease without esophagitis: Secondary | ICD-10-CM | POA: Diagnosis not present

## 2017-10-17 DIAGNOSIS — I739 Peripheral vascular disease, unspecified: Secondary | ICD-10-CM | POA: Diagnosis not present

## 2017-10-17 DIAGNOSIS — M79675 Pain in left toe(s): Secondary | ICD-10-CM | POA: Diagnosis not present

## 2017-10-17 DIAGNOSIS — M79674 Pain in right toe(s): Secondary | ICD-10-CM | POA: Diagnosis not present

## 2017-10-17 DIAGNOSIS — E1142 Type 2 diabetes mellitus with diabetic polyneuropathy: Secondary | ICD-10-CM | POA: Diagnosis not present

## 2017-10-17 NOTE — Progress Notes (Signed)
Location:  [facility] Room Number: 201D Place of Service:  SNF (31)  [name], MD  [name], MD as PCP - General (Internal Medicine)  Extended Emergency Contact Information Primary Emergency Contact: Nisley,[name] Address: [address]          [place], [place] [address] [name] of [place] Home Phone: [phone] Mobile Phone: [phone] Relation: Sister Secondary Emergency Contact: [name],[name] Address: [address]          [place], [place] [address] [name] of [place] Home Phone: [phone] Relation: Grandaughter    Allergies: Tequin [gatifloxacin]  Chief Complaint  Patient presents with  . Medical Management of Chronic Issues    Routine Visit    HPI: Patient is 84 y.o. female who is being seen for routine issues of diabetes mellitus 2, GERD, and diabetic neuropathy.  Past Medical History:  Diagnosis Date  . Breast mass, right 12/13/2013  . Chronic diastolic CHF (congestive heart failure) (HCC) 07/19/2013  . Chronic diastolic heart failure (HCC)   . Chronic kidney disease, stage II (mild)   . Chronic renal disease, stage 2, mildly decreased glomerular filtration rate (GFR) between 60-89 mL/min/1.73 square meter 09/03/2014  . Closed T12 fracture (HCC) 07/16/2013  . Depression 07/18/2013  . Diabetes mellitus   . Diabetes mellitus due to underlying condition (HCC) 06/21/2013  . Diabetes mellitus without complication (HCC)   . Dysphagia, oropharyngeal phase   . GERD (gastroesophageal reflux disease) 12/17/2014  . Gout   . Hypertension   . Hypertensive heart disease with congestive heart failure (HCC) 06/21/2013  . Iron deficiency anemia 09/03/2014  . Iron deficiency anemia, unspecified   . Leukocytosis 07/18/2013  . Lumbago 10/23/2013  . Lung nodule < 6cm on CT 11/13/2016  . Osteopenia 07/16/2013  . Syncope 06/21/2013  . Type 2 diabetes, controlled, with neuropathy (HCC) 10/18/2013    . Unspecified constipation 10/18/2013  . Vitamin D deficiency 04/11/2016    Past Surgical History:  Procedure Laterality Date  . NO PAST SURGERIES      Allergies as of 10/17/2017      Reactions   Tequin [gatifloxacin]       Medication List        Accurate as of 10/17/17 11:59 PM. Always use your most recent med list.          aspirin 81 MG chewable tablet Chew 1 tablet (81 mg total) by mouth daily.   ferrous sulfate 325 (65 FE) MG tablet Take 325 mg by mouth daily with breakfast.   furosemide 40 MG tablet Commonly known as:  LASIX Take 40 mg by mouth every morning.   gabapentin 300 MG capsule Commonly known as:  NEURONTIN Take 1 capsule (300 mg total) by mouth at bedtime.   ipratropium-albuterol 0.5-2.5 (3) MG/3ML Soln Commonly known as:  DUONEB Take 3 mLs by nebulization every 6 (six) hours as needed.   losartan 25 MG tablet Commonly known as:  COZAAR Take 25 mg by mouth every morning. For HTN   metFORMIN 500 MG 24 hr tablet Commonly known as:  GLUCOPHAGE-XR Take 500 mg by mouth every evening. For diabetes   NUTRITIONAL SUPPLEMENT Liqd Take by mouth. Magic cup-take one cup with dinner as supplement   omeprazole 20 MG capsule Commonly known as:  PRILOSEC Take 20 mg by mouth daily. For GERD   potassium chloride 10 MEQ tablet Commonly known as:  K-DUR Take 10 mEq by mouth. Take 3  tablets (30 meq) daily for potassium supplement   traMADol 50 MG tablet Commonly known as:  ULTRAM Take 50 mg by mouth 2 (two) times daily.       No orders of the defined types were placed in this encounter.   Immunization History  Administered Date(s) Administered  . Influenza-Unspecified 03/23/2015, 04/06/2016, 04/06/2017  . PPD Test 08/12/2013  . Pneumococcal Polysaccharide-23 06/23/2013    Social History   Tobacco Use  . Smoking status: Former Games developermoker  . Smokeless tobacco: Never Used  Substance Use Topics  . Alcohol use: No    Review of Systems  DATA  OBTAINED: from nurse GENERAL:  no fevers, fatigue, appetite changes SKIN: No itching, rash HEENT: No complaint RESPIRATORY: No cough, wheezing, SOB CARDIAC: No chest pain, palpitations, lower extremity edema  GI: No abdominal pain, No N/V/D or constipation, No heartburn or reflux  GU: No dysuria, frequency or urgency, or incontinence  MUSCULOSKELETAL: No unrelieved bone/joint pain NEUROLOGIC: No headache, dizziness  PSYCHIATRIC: No overt anxiety or sadness  Vitals:   10/17/17 0930  BP: 129/67  Pulse: 78  Resp: 20  Temp: (!) 97 F (36.1 C)  SpO2: 95%   Body mass index is 20.95 kg/m. Physical Exam  GENERAL APPEARANCE: Alert, minimally conversant, No acute distress  SKIN: No diaphoresis rash HEENT: Unremarkable RESPIRATORY: Breathing is even, unlabored. Lung sounds are clear   CARDIOVASCULAR: Heart RRR no murmurs, rubs or gallops. No peripheral edema  GASTROINTESTINAL: Abdomen is soft, non-tender, not distended w/ normal bowel sounds.  GENITOURINARY: Bladder non tender, not distended  MUSCULOSKELETAL: No abnormal joints or musculature NEUROLOGIC: Cranial nerves 2-12 grossly intact. Moves all extremities PSYCHIATRIC: Mood and affect appropriate to situation, no behavioral issues  Patient Active Problem List   Diagnosis Date Noted  . Lung nodule < 6cm on CT 11/13/2016  . Vitamin D deficiency 04/11/2016  . Hyperlipidemia 02/11/2016  . Pain 11/07/2015  . Wheezing 11/07/2015  . Edema 10/25/2015  . Conjunctivitis 07/09/2015  . UTI (urinary tract infection) 03/24/2015  . GERD (gastroesophageal reflux disease) 12/17/2014  . Hypokalemia 12/17/2014  . Depression 11/25/2014  . Iron deficiency anemia 09/03/2014  . Chronic renal disease, stage 2, mildly decreased glomerular filtration rate (GFR) between 60-89 mL/min/1.73 square meter 09/03/2014  . Pain in joint, lower leg 08/21/2014  . Cough 01/27/2014  . Dizziness 01/12/2014  . Contusion of unspecified site 01/12/2014  .  Breast mass, right 12/13/2013  . Lumbago 10/23/2013  . Type 2 diabetes, controlled, with neuropathy (HCC) 10/18/2013  . 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 Unspecified constipation 10/18/2013  . Plantar fasciitis, left 07/29/2013  . Physical deconditioning 07/21/2013  . Diabetes mellitus, controlled (HCC) 07/19/2013  . Chronic diastolic CHF (congestive heart failure) (HCC) 07/19/2013  . HCAP (healthcare-associated pneumonia) 07/18/2013  . Chronic diastolic heart failure (HCC) 06/24/2013  . Syncope 06/21/2013  . Syncope and collapse 06/21/2013  . Leukocytosis 06/21/2013  . Hypertensive heart disease with congestive heart failure (HCC) 06/21/2013  . Diabetes mellitus due to underlying condition (HCC) 06/21/2013    CMP     Component Value Date/Time   NA 144 07/27/2017   K 3.9 07/27/2017   CL 106 10/09/2016 1425   CO2 26 10/09/2016 1425   GLUCOSE 97 10/09/2016 1425   BUN 20 07/27/2017   CREATININE 1.0 07/27/2017   CREATININE 0.82 10/09/2016 1425   CALCIUM 8.8 (L) 10/09/2016 1425   PROT 6.4 (L) 10/09/2016 1425   ALBUMIN 3.4 (L) 10/09/2016  1425   AST 21 07/27/2017   ALT 13 07/27/2017   ALKPHOS 99 07/27/2017   BILITOT 0.5 10/09/2016 1425   GFRNONAA >60 10/09/2016 1425   GFRAA >60 10/09/2016 1425   Recent Labs    01/16/17 02/15/17 07/27/17  NA 145 144 144  K 3.6 4.4 3.9  BUN 19 16 20   CREATININE 0.9 1.0 1.0   Recent Labs    01/16/17 02/15/17 07/27/17  AST 18 27 21   ALT 9 15 13   ALKPHOS 85 112 99   Recent Labs    01/16/17 02/15/17 07/27/17  WBC 5.9 4.6 5.2  HGB 12.2 14.1 11.3*  HCT 37 43 33*  PLT 180 239 206   Recent Labs    01/16/17 07/27/17  CHOL 166 160  LDLCALC 97 86  TRIG 100 79   No results found for: MICROALBUR Lab Results  Component Value Date   TSH 0.36 (A) 07/27/2017   Lab Results  Component Value Date   HGBA1C 6.2 07/27/2017   Lab Results  Component Value Date   CHOL 160 07/27/2017   HDL 58  07/27/2017   LDLCALC 86 07/27/2017   TRIG 79 07/27/2017   CHOLHDL 3.2 06/22/2013    Significant Diagnostic Results in last 30 days:  No results found.  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 Lyn Hollingshead, MD

## 2017-11-05 ENCOUNTER — Encounter: Payer: Self-pay | Admitting: Internal Medicine

## 2017-11-05 NOTE — Assessment & Plan Note (Signed)
Hemoglobin A1c 6.2; well-controlled on Glucophage XR 500 mg daily; patient is on ARB

## 2017-11-05 NOTE — Assessment & Plan Note (Signed)
[place] controlled; continue Neurontin 300 mg nightly

## 2017-11-05 NOTE — Assessment & Plan Note (Signed)
No reported problems; continue Prilosec 20 mg daily

## 2017-11-07 ENCOUNTER — Encounter: Payer: Self-pay | Admitting: Internal Medicine

## 2017-11-07 NOTE — Progress Notes (Signed)
Opened in error; Disregard.

## 2017-11-08 ENCOUNTER — Non-Acute Institutional Stay (SKILLED_NURSING_FACILITY): Payer: Medicare Other | Admitting: Internal Medicine

## 2017-11-08 ENCOUNTER — Encounter: Payer: Self-pay | Admitting: Internal Medicine

## 2017-11-08 DIAGNOSIS — I5032 Chronic diastolic (congestive) heart failure: Secondary | ICD-10-CM | POA: Diagnosis not present

## 2017-11-08 DIAGNOSIS — E559 Vitamin D deficiency, unspecified: Secondary | ICD-10-CM

## 2017-11-08 DIAGNOSIS — D508 Other iron deficiency anemias: Secondary | ICD-10-CM

## 2017-11-08 NOTE — Progress Notes (Signed)
Location:  [facility] Room Number: 201D Place of Service:  SNF (31)  [name], MD  [name], MD as PCP - General (Internal Medicine)  Extended Emergency Contact Information Primary Emergency Contact: Clendenin,[name] Address: [address]          [place], [place] [address] [name] of [place] Home Phone: [phone] Mobile Phone: [phone] Relation: Sister Secondary Emergency Contact: [name],[name] Address: [address]          [place], [place] [address] [name] of [place] Home Phone: [phone] Relation: Grandaughter    Allergies: Tequin [gatifloxacin]  Chief Complaint  Patient presents with  . Medical Management of Chronic Issues    HPI: Patient is 84 y.o. female who being seen for routine issues of diastolic congestive heart failure, chronic, iron deficiency anemia, and vitamin D deficiency.  Past Medical History:  Diagnosis Date  . Breast mass, right 12/13/2013  . Chronic diastolic CHF (congestive heart failure) (HCC) 07/19/2013  . Chronic diastolic heart failure (HCC)   . Chronic kidney disease, stage II (mild)   . Chronic renal disease, stage 2, mildly decreased glomerular filtration rate (GFR) between 60-89 mL/min/1.73 square meter 09/03/2014  . Closed T12 fracture (HCC) 07/16/2013  . Depression 07/18/2013  . Diabetes mellitus   . Diabetes mellitus due to underlying condition (HCC) 06/21/2013  . Diabetes mellitus without complication (HCC)   . Dysphagia, oropharyngeal phase   . GERD (gastroesophageal reflux disease) 12/17/2014  . Gout   . Hypertension   . Hypertensive heart disease with congestive heart failure (HCC) 06/21/2013  . Iron deficiency anemia 09/03/2014  . Iron deficiency anemia, unspecified   . Leukocytosis 07/18/2013  . Lumbago 10/23/2013  . Lung nodule < 6cm on CT 11/13/2016  . Osteopenia 07/16/2013  . Syncope 06/21/2013  . Type 2 diabetes, controlled, with  neuropathy (HCC) 10/18/2013  . Unspecified constipation 10/18/2013  . Vitamin D deficiency 04/11/2016    Past Surgical History:  Procedure Laterality Date  . NO PAST SURGERIES      Allergies as of 11/08/2017      Reactions   Tequin [gatifloxacin]       Medication List        Accurate as of 11/08/17 11:59 PM. Always use your most recent med list.          aspirin 81 MG chewable tablet Chew 1 tablet (81 mg total) by mouth daily.   ferrous sulfate 325 (65 FE) MG tablet Take 325 mg by mouth daily with breakfast.   furosemide 40 MG tablet Commonly known as:  LASIX Take 40 mg by mouth every morning.   gabapentin 300 MG capsule Commonly known as:  NEURONTIN Take 1 capsule (300 mg total) by mouth at bedtime.   ipratropium-albuterol 0.5-2.5 (3) MG/3ML Soln Commonly known as:  DUONEB Take 3 mLs by nebulization every 6 (six) hours as needed.   losartan 25 MG tablet Commonly known as:  COZAAR Take 25 mg by mouth every morning. For HTN   metFORMIN 500 MG 24 hr tablet Commonly known as:  GLUCOPHAGE-XR Take 500 mg by mouth every evening. For diabetes   NUTRITIONAL SUPPLEMENT Liqd Take by mouth. Magic cup-take one cup with dinner as supplement   omeprazole 20 MG capsule Commonly known as:  PRILOSEC Take 20 mg by mouth daily. For GERD   potassium chloride 10 MEQ tablet Commonly known as:  K-DUR Take 30 mEq by mouth daily. Take 3 tablets  potassium supplement   traMADol 50 MG tablet Commonly known as:  ULTRAM Take 50 mg by mouth 2 (two) times daily.   Vitamin D (Ergocalciferol) 50000 units Caps capsule Commonly known as:  DRISDOL Take 50,000 Units by mouth every 7 (seven) days.       No orders of the defined types were placed in this encounter.   Immunization History  Administered Date(s) Administered  . Influenza-Unspecified 03/23/2015, 04/06/2016, 04/06/2017  . PPD Test 08/12/2013  . Pneumococcal Polysaccharide-23 06/23/2013    Social History   Tobacco  Use  . Smoking status: Former Games developer  . Smokeless tobacco: Never Used  Substance Use Topics  . Alcohol use: No    Review of Systems  DATA OBTAINED: from nurse GENERAL:  no fevers, fatigue, appetite changes SKIN: No itching, rash HEENT: No complaint RESPIRATORY: No cough, wheezing, SOB CARDIAC: No chest pain, palpitations, lower extremity edema  GI: No abdominal pain, No N/V/D or constipation, No heartburn or reflux  GU: No dysuria, frequency or urgency, or incontinence  MUSCULOSKELETAL: No unrelieved bone/joint pain NEUROLOGIC: No headache, dizziness  PSYCHIATRIC: No overt anxiety or sadness  Vitals:   11/08/17 1443  BP: 120/67  Pulse: 75  Resp: 18  Temp: 97.9 F (36.6 C)   Body mass index is 20.43 kg/m. Physical Exam  GENERAL APPEARANCE: Alert, intimately conversant, No acute distress  SKIN: No diaphoresis rash HEENT: Unremarkable RESPIRATORY: Breathing is even, unlabored. Lung sounds are clear   CARDIOVASCULAR: Heart RRR no murmurs, rubs or gallops. No peripheral edema  GASTROINTESTINAL: Abdomen is soft, non-tender, not distended w/ normal bowel sounds.  GENITOURINARY: Bladder non tender, not distended  MUSCULOSKELETAL: No abnormal joints or musculature NEUROLOGIC: Cranial nerves 2-12 grossly intact. Moves all extremities PSYCHIATRIC: Mood and affect appropriate to situation, no behavioral issues, patient out in all daily in wheelchair  Patient Active Problem List   Diagnosis Date Noted  . Lung nodule < 6cm on CT 11/13/2016  . Vitamin D deficiency 04/11/2016  . Hyperlipidemia 02/11/2016  . Pain 11/07/2015  . Wheezing 11/07/2015  . Edema 10/25/2015  . Conjunctivitis 07/09/2015  . UTI (urinary tract infection) 03/24/2015  . GERD (gastroesophageal reflux disease) 12/17/2014  . Hypokalemia 12/17/2014  . Depression 11/25/2014  . Iron deficiency anemia 09/03/2014  . Chronic renal disease, stage 2, mildly decreased glomerular filtration rate (GFR) between 60-89  mL/min/1.73 square meter 09/03/2014  . Pain in joint, lower leg 08/21/2014  . Cough 01/27/2014  . Dizziness 01/12/2014  . Contusion of unspecified site 01/12/2014  . Breast mass, right 12/13/2013  . Lumbago 10/23/2013  . Type 2 diabetes, controlled, with neuropathy (HCC) 10/18/2013  . 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 Unspecified constipation 10/18/2013  . Plantar fasciitis, left 07/29/2013  . Physical deconditioning 07/21/2013  . Diabetes mellitus, controlled (HCC) 07/19/2013  . Chronic diastolic CHF (congestive heart failure) (HCC) 07/19/2013  . HCAP (healthcare-associated pneumonia) 07/18/2013  . Chronic diastolic heart failure (HCC) 06/24/2013  . Syncope 06/21/2013  . Syncope and collapse 06/21/2013  . Leukocytosis 06/21/2013  . Hypertensive heart disease with congestive heart failure (HCC) 06/21/2013  . Diabetes mellitus due to underlying condition (HCC) 06/21/2013    CMP     Component Value Date/Time   NA 144 07/27/2017   K 3.9 07/27/2017   CL 106 10/09/2016 1425   CO2 26 10/09/2016 1425   GLUCOSE 97 10/09/2016 1425   BUN 20 07/27/2017   CREATININE 1.0 07/27/2017   CREATININE 0.82 10/09/2016  1425   CALCIUM 8.8 (L) 10/09/2016 1425   PROT 6.4 (L) 10/09/2016 1425   ALBUMIN 3.4 (L) 10/09/2016 1425   AST 21 07/27/2017   ALT 13 07/27/2017   ALKPHOS 99 07/27/2017   BILITOT 0.5 10/09/2016 1425   GFRNONAA >60 10/09/2016 1425   GFRAA >60 10/09/2016 1425   Recent Labs    01/16/17 02/15/17 07/27/17  NA 145 144 144  K 3.6 4.4 3.9  BUN CREATININE 0.9 1.0 1.0   Recent Labs    01/16/17 02/15/17 07/27/17  AST ALT ALKPHOS 85 112 99   Recent Labs    01/16/17 02/15/17 07/27/17  WBC 5.9 4.6 5.2  HGB 12.2 14.1 11.3*  HCT 37 43 33*  PLT 180 239 206   Recent Labs    01/16/17 07/27/17  CHOL 166 160  LDLCALC 97 86  TRIG 100 79   No results found for: MICROALBUR Lab Results  Component Value  Date   TSH 0.36 (A) 07/27/2017   Lab Results  Component Value Date   HGBA1C 6.2 07/27/2017   Lab Results  Component Value Date   CHOL 160 07/27/2017   HDL 58 07/27/2017   LDLCALC 86 07/27/2017   TRIG 79 07/27/2017   CHOLHDL 3.2 06/22/2013    Significant Diagnostic Results in last 30 days:  No results found.  Assessment and Plan  Chronic diastolic CHF (congestive heart failure) Stable without exacerbation.;  Continue Lasix 40 mg daily and Cozaar 25 mg daily  Iron deficiency anemia Patient's recent hemoglobin is 11.3 which is stable from a year ago; plan to continue iron 325 daily  Vitamin D deficiency Stable; continue vitamin D 50,000 units weekly    Thurston Hole D. Lyn Hollingshead, MD

## 2017-11-20 ENCOUNTER — Encounter: Payer: Self-pay | Admitting: Internal Medicine

## 2017-11-20 NOTE — Assessment & Plan Note (Signed)
Stable; continue vitamin D 50,000 units weekly

## 2017-11-20 NOTE — Assessment & Plan Note (Signed)
Stable without exacerbation.;  Continue Lasix 40 mg daily and Cozaar 25 mg daily

## 2017-11-20 NOTE — Assessment & Plan Note (Signed)
Patient's recent hemoglobin is 11.3 which is stable from a year ago; plan to continue iron 325 daily

## 2017-11-30 ENCOUNTER — Encounter: Payer: Self-pay | Admitting: Internal Medicine

## 2017-11-30 ENCOUNTER — Non-Acute Institutional Stay (SKILLED_NURSING_FACILITY): Payer: Medicare Other | Admitting: Internal Medicine

## 2017-11-30 DIAGNOSIS — E785 Hyperlipidemia, unspecified: Secondary | ICD-10-CM | POA: Diagnosis not present

## 2017-11-30 DIAGNOSIS — E1142 Type 2 diabetes mellitus with diabetic polyneuropathy: Secondary | ICD-10-CM

## 2017-11-30 DIAGNOSIS — I5032 Chronic diastolic (congestive) heart failure: Secondary | ICD-10-CM | POA: Diagnosis not present

## 2017-11-30 NOTE — Progress Notes (Signed)
Location:  [facility] Room Number: 201D Place of Service:  SNF ([id number])  Margaret Hanks, MD  Patient Care Team: Margaret Hanks, MD as PCP - General (Internal Medicine)  Extended Emergency Contact Information Primary Emergency Contact: Bok,[name] Address: [address]          [place], [place] [address] [name] of [place] Home Phone: [phone] Mobile Phone: [phone] Relation: Sister Secondary Emergency Contact: [name],[name] Address: [address]          [place], [place] [address] [name] of [place] Home Phone: [phone] Relation: Grandaughter    Allergies: Tequin [gatifloxacin]  Chief Complaint  Patient presents with  . Medical Management of Chronic Issues    Routine Visit    HPI: Patient is 84 y.o. female who is being seen for routine issues of chronic diastolic congestive heart failure, hyperlipidemia, and peripheral neuropathy.  Past Medical History:  Diagnosis Date  . Breast mass, right 12/13/2013  . Chronic diastolic CHF (congestive heart failure) (HCC) 07/19/2013  . Chronic diastolic heart failure (HCC)   . Chronic kidney disease, stage II (mild)   . Chronic renal disease, stage 2, mildly decreased glomerular filtration rate (GFR) between 60-89 mL/min/1.73 square meter 09/03/2014  . Closed T12 fracture (HCC) 07/16/2013  . Depression 07/18/2013  . Diabetes mellitus   . Diabetes mellitus due to underlying condition (HCC) 06/21/2013  . Diabetes mellitus without complication (HCC)   . Dysphagia, oropharyngeal phase   . GERD (gastroesophageal reflux disease) 12/17/2014  . Gout   . Hypertension   . Hypertensive heart disease with congestive heart failure (HCC) 06/21/2013  . Iron deficiency anemia 09/03/2014  . Iron deficiency anemia, unspecified   . Leukocytosis 07/18/2013  . Lumbago 10/23/2013  . Lung nodule < 6cm on CT 11/13/2016  . Osteopenia 07/16/2013  . Syncope 06/21/2013  . Type 2 diabetes,  controlled, with neuropathy (HCC) 10/18/2013  . Unspecified constipation 10/18/2013  . Vitamin D deficiency 04/11/2016    Past Surgical History:  Procedure Laterality Date  . NO PAST SURGERIES      Allergies as of 11/30/2017      Reactions   Tequin [gatifloxacin]       Medication List        Accurate as of 11/30/17 11:59 PM. Always use your most recent med list.          aspirin 81 MG chewable tablet Chew 1 tablet (81 mg total) by mouth daily.   ferrous sulfate 325 (65 FE) MG tablet Take 325 mg by mouth daily with breakfast.   furosemide 40 MG tablet Commonly known as:  LASIX Take 40 mg by mouth every morning.   gabapentin 300 MG capsule Commonly known as:  NEURONTIN Take 1 capsule (300 mg total) by mouth at bedtime.   ipratropium-albuterol 0.5-2.5 (3) MG/3ML Soln Commonly known as:  DUONEB Take 3 mLs by nebulization every 6 (six) hours as needed.   losartan 25 MG tablet Commonly known as:  COZAAR Take 25 mg by mouth every morning. For HTN   metFORMIN 500 MG 24 hr tablet Commonly known as:  GLUCOPHAGE-XR Take 500 mg by mouth every evening. For diabetes   NUTRITIONAL SUPPLEMENT Liqd Take by mouth. Magic cup-take one cup with dinner as supplement   omeprazole 20 MG capsule Commonly known as:  PRILOSEC Take 20 mg by mouth daily. For GERD   potassium chloride 10 MEQ tablet Commonly known as:  K-DUR Take 30 mEq by mouth daily.  Take 3 tablets potassium supplement   traMADol 50 MG tablet Commonly known as:  ULTRAM Take 50 mg by mouth 2 (two) times daily.   Vitamin D (Ergocalciferol) 50000 units Caps capsule Commonly known as:  DRISDOL Take 50,000 Units by mouth every 7 (seven) days.       No orders of the defined types were placed in this encounter.   Immunization History  Administered Date(s) Administered  . Influenza-Unspecified 03/23/2015, 04/06/2016, 04/06/2017  . PPD Test 08/12/2013  . Pneumococcal Polysaccharide-23 06/23/2013    Social History    Tobacco Use  . Smoking status: Former Games developermoker  . Smokeless tobacco: Never Used  Substance Use Topics  . Alcohol use: No    Review of Systems  DATA OBTAINED: from patient, nurse GENERAL:  no fevers, fatigue, appetite changes SKIN: No itching, rash HEENT: No complaint RESPIRATORY: No cough, wheezing, SOB CARDIAC: No chest pain, palpitations, lower extremity edema  GI: No abdominal pain, No N/V/D or constipation, No heartburn or reflux  GU: No dysuria, frequency or urgency, or incontinence  MUSCULOSKELETAL: No unrelieved bone/joint pain NEUROLOGIC: No headache, dizziness  PSYCHIATRIC: No overt anxiety or sadness  Vitals:   11/30/17 1005  BP: 130/70  Pulse: 74  Resp: 18  Temp: 98 F (36.7 C)  SpO2: 95%   Body mass index is 20.43 kg/m. Physical Exam  GENERAL APPEARANCE: Alert, Margaret [name] conversant, No acute distress  SKIN: No diaphoresis rash HEENT: Unremarkable RESPIRATORY: Breathing is even, unlabored. Lung sounds are clear   CARDIOVASCULAR: Heart RRR no murmurs, rubs or gallops. No peripheral edema  GASTROINTESTINAL: Abdomen is soft, non-tender, not distended w/ normal bowel sounds.  GENITOURINARY: Bladder non tender, not distended  MUSCULOSKELETAL: No abnormal joints or musculature NEUROLOGIC: Cranial nerves 2-12 grossly intact. Moves all extremities PSYCHIATRIC: Mood and affect appropriate to situation, no behavioral issues  Patient Active Problem List   Diagnosis Date Noted  . Lung nodule < 6cm on CT 11/13/2016  . Vitamin D deficiency 04/11/2016  . Hyperlipidemia 02/11/2016  . Pain 11/07/2015  . Wheezing 11/07/2015  . Edema 10/25/2015  . Conjunctivitis 07/09/2015  . UTI (urinary tract infection) 03/24/2015  . GERD (gastroesophageal reflux disease) 12/17/2014  . Hypokalemia 12/17/2014  . Depression 11/25/2014  . Iron deficiency anemia 09/03/2014  . Chronic renal disease, stage 2, mildly decreased glomerular filtration rate (GFR) between 60-89 mL/min/1.73  square meter 09/03/2014  . Pain in joint, lower leg 08/21/2014  . Cough 01/27/2014  . Dizziness 01/12/2014  . Contusion of unspecified site 01/12/2014  . Breast mass, right 12/13/2013  . Lumbago 10/23/2013  . Type 2 diabetes, controlled, with neuropathy (HCC) 10/18/2013  . 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 Unspecified constipation 10/18/2013  . Plantar fasciitis, left 07/29/2013  . Physical deconditioning 07/21/2013  . Diabetes mellitus, controlled (HCC) 07/19/2013  . Chronic diastolic CHF (congestive heart failure) (HCC) 07/19/2013  . HCAP (healthcare-associated pneumonia) 07/18/2013  . Chronic diastolic heart failure (HCC) 06/24/2013  . Syncope 06/21/2013  . Syncope and collapse 06/21/2013  . Leukocytosis 06/21/2013  . Hypertensive heart disease with congestive heart failure (HCC) 06/21/2013  . Diabetes mellitus due to underlying condition (HCC) 06/21/2013    CMP     Component Value Date/Time   NA 144 07/27/2017   K 3.9 07/27/2017   CL 106 10/09/2016 1425   CO2 26 10/09/2016 1425   GLUCOSE 97 10/09/2016 1425   BUN 20 07/27/2017   CREATININE 1.0 07/27/2017   CREATININE 0.82 10/09/2016  1425   CALCIUM 8.8 (L) 10/09/2016 1425   PROT 6.4 (L) 10/09/2016 1425   ALBUMIN 3.4 (L) 10/09/2016 1425   AST 21 07/27/2017   ALT 13 07/27/2017   ALKPHOS 99 07/27/2017   BILITOT 0.5 10/09/2016 1425   GFRNONAA >60 10/09/2016 1425   GFRAA >60 10/09/2016 1425   Recent Labs    01/16/17 02/15/17 07/27/17  NA 145 144 144  K 3.6 4.4 3.9  BUN 19 16 20   CREATININE 0.9 1.0 1.0   Recent Labs    01/16/17 02/15/17 07/27/17  AST 18 27 21   ALT 9 15 13   ALKPHOS 85 112 99   Recent Labs    01/16/17 02/15/17 07/27/17  WBC 5.9 4.6 5.2  HGB 12.2 14.1 11.3*  HCT 37 43 33*  PLT 180 239 206   Recent Labs    01/16/17 07/27/17  CHOL 166 160  LDLCALC 97 86  TRIG 100 79   No results found for: MICROALBUR Lab Results  Component Value Date   TSH  0.36 (A) 07/27/2017   Lab Results  Component Value Date   HGBA1C 6.2 07/27/2017   Lab Results  Component Value Date   CHOL 160 07/27/2017   HDL 58 07/27/2017   LDLCALC 86 07/27/2017   TRIG 79 07/27/2017   CHOLHDL 3.2 06/22/2013    Significant Diagnostic Results in last 30 days:  No results found.  Assessment and Plan  Chronic diastolic heart failure No exacerbations in many many months; plan to continue Lasix 40 mg daily and Cozaar 25 mg daily  Hyperlipidemia Rested well controlled; on no meds monitor at intervals  Peripheral sensory neuropathy due to type 2 diabetes mellitus Stable without complaints; continue Neurontin 300 mg nightly    Margaret [name] D. Lyn Hollingshead, MD

## 2017-12-11 ENCOUNTER — Non-Acute Institutional Stay (SKILLED_NURSING_FACILITY): Payer: Medicare Other

## 2017-12-11 DIAGNOSIS — Z0000 Encounter for general adult medical examination without abnormal findings: Secondary | ICD-10-CM | POA: Diagnosis not present

## 2017-12-11 NOTE — Patient Instructions (Signed)
Margaret [name] , Thank you for taking time to come for your Medicare Wellness Visit. I appreciate your ongoing commitment to your health goals. Please review the following plan we discussed and let me know if I can assist you in the future.   Screening recommendations/referrals: Colonoscopy excluded, over age 82 Mammogram excluded, over age 82 Bone Density due, ordered Recommended yearly ophthalmology/optometry visit for glaucoma screening and checkup Recommended yearly dental visit for hygiene and checkup  Vaccinations: Influenza vaccine due 2019 fall season Pneumococcal vaccine 13 due, ordered Tdap vaccine due, ordered Shingles vaccine not in past records    Advanced directives: need a copy for chart  Conditions/risks identified: none  Next appointment: Dr. Lyn HollingsheadAlexander makes rounds   Preventive Care 65 Years and Older, Female Preventive care refers to lifestyle choices and visits with your health care provider that can promote health and wellness. What does preventive care include?  A yearly physical exam. This is also called an annual well check.  Dental exams once or twice a year.  Routine eye exams. Ask your health care provider how often you should have your eyes checked.  Personal lifestyle choices, including:  Daily care of your teeth and gums.  Regular physical activity.  Eating a healthy diet.  Avoiding tobacco and drug use.  Limiting alcohol use.  Practicing safe sex.  Taking low-dose aspirin every day.  Taking vitamin and mineral supplements as recommended by your health care provider. What happens during an annual well check? The services and screenings done by your health care provider during your annual well check will depend on your age, overall health, lifestyle risk factors, and family history of disease. Counseling  Your health care provider may ask you questions about your:  Alcohol use.  Tobacco use.  Drug use.  Emotional well-being.  Home and  relationship well-being.  Sexual activity.  Eating habits.  History of falls.  Memory and ability to understand (cognition).  Work and work Astronomerenvironment.  Reproductive health. Screening  You may have the following tests or measurements:  Height, weight, and BMI.  Blood pressure.  Lipid and cholesterol levels. These may [place] checked every 5 years, or more frequently if you are over 479 years old.  Skin check.  Lung cancer screening. You may have this screening every year starting at age 82 if you have a 30-pack-year history of smoking and currently smoke or have quit within the past 15 years.  Fecal occult blood test (FOBT) of the stool. You may have this test every year starting at age 82.  Flexible sigmoidoscopy or colonoscopy. You may have a sigmoidoscopy every 5 years or a colonoscopy every 10 years starting at age 82.  Hepatitis C blood test.  Hepatitis B blood test.  Sexually transmitted disease (STD) testing.  Diabetes screening. This is done by checking your blood sugar (glucose) after you have not eaten for a while (fasting). You may have this done every 1-3 years.  Bone density scan. This is done to screen for osteoporosis. You may have this done starting at age 82.  Mammogram. This may [place] done every 1-2 years. Talk to your health care provider about how often you should have regular mammograms. Talk with your health care provider about your test results, treatment options, and if necessary, the need for more tests. Vaccines  Your health care provider may recommend certain vaccines, such as:  Influenza vaccine. This is recommended every year.  Tetanus, diphtheria, and acellular pertussis (Tdap, Td) vaccine. You may need  a Td booster every 10 years.  Zoster vaccine. You may need this after age 28.  Pneumococcal 13-valent conjugate (PCV13) vaccine. One dose is recommended after age 56.  Pneumococcal polysaccharide (PPSV23) vaccine. One dose is recommended after  age 73. Talk to your health care provider about which screenings and vaccines you need and how often you need them. This information is not intended to replace advice given to you by your health care provider. Make sure you discuss any questions you have with your health care provider. Document Released: 07/10/2015 Document Revised: 03/02/2016 Document Reviewed: 04/14/2015 Elsevier Interactive Patient Education  2017 [employer] Prevention in the Home Falls can cause injuries. They can happen to people of all ages. There are many things you can do to make your home safe and to help prevent falls. What can I do on the outside of my home?  Regularly fix the edges of walkways and driveways and fix any cracks.  Remove anything that might make you trip as you walk through a door, such as a raised step or threshold.  Trim any bushes or trees on the path to your home.  Use bright outdoor lighting.  Clear any walking paths of anything that might make someone trip, such as rocks or tools.  Regularly check to see if handrails are loose or broken. Make sure that both sides of any steps have handrails.  Any raised decks and porches should have guardrails on the edges.  Have any leaves, snow, or ice cleared regularly.  Use sand or salt on walking paths during winter.  Clean up any spills in your garage right away. This includes oil or grease spills. What can I do in the bathroom?  Use night lights.  Install grab bars by the toilet and in the tub and shower. Do not use towel bars as grab bars.  Use non-skid mats or decals in the tub or shower.  If you need to sit down in the shower, use a plastic, non-slip stool.  Keep the floor dry. Clean up any water that spills on the floor as soon as it happens.  Remove soap buildup in the tub or shower regularly.  Attach bath mats securely with double-sided non-slip rug tape.  Do not have throw rugs and other things on the floor that can  make you trip. What can I do in the bedroom?  Use night lights.  Make sure that you have a light by your bed that is easy to reach.  Do not use any sheets or blankets that are too big for your bed. They should not hang down onto the floor.  Have a firm chair that has side arms. You can use this for support while you get dressed.  Do not have throw rugs and other things on the floor that can make you trip. What can I do in the kitchen?  Clean up any spills right away.  Avoid walking on wet floors.  Keep items that you use a lot in easy-to-reach places.  If you need to reach something above you, use a strong step stool that has a grab bar.  Keep electrical cords out of the way.  Do not use floor polish or wax that makes floors slippery. If you must use wax, use non-skid floor wax.  Do not have throw rugs and other things on the floor that can make you trip. What can I do with my stairs?  Do not leave any items on the  stairs.  Make sure that there are handrails on both sides of the stairs and use them. Fix handrails that are broken or loose. Make sure that handrails are as long as the stairways.  Check any carpeting to make sure that it is firmly attached to the stairs. Fix any carpet that is loose or worn.  Avoid having throw rugs at the top or bottom of the stairs. If you do have throw rugs, attach them to the floor with carpet tape.  Make sure that you have a light switch at the top of the stairs and the bottom of the stairs. If you do not have them, ask someone to add them for you. What else can I do to help prevent falls?  Wear shoes that:  Do not have high heels.  Have rubber bottoms.  Are comfortable and fit you well.  Are closed at the toe. Do not wear sandals.  If you use a stepladder:  Make sure that it is fully opened. Do not climb a closed stepladder.  Make sure that both sides of the stepladder are locked into place.  Ask someone to hold it for you,  if possible.  Clearly mark and make sure that you can see:  Any grab bars or handrails.  First and last steps.  Where the edge of each step is.  Use tools that help you move around (mobility aids) if they are needed. These include:  Canes.  Walkers.  Scooters.  Crutches.  Turn on the lights when you go into a dark area. Replace any light bulbs as soon as they burn out.  Set up your furniture so you have a clear path. Avoid moving your furniture around.  If any of your floors are uneven, fix them.  If there are any pets around you, [place] aware of where they are.  Review your medicines with your doctor. Some medicines can make you feel dizzy. This can increase your chance of falling. Ask your doctor what other things that you can do to help prevent falls. This information is not intended to replace advice given to you by your health care provider. Make sure you discuss any questions you have with your health care provider. Document Released: 04/09/2009 Document Revised: 11/19/2015 Document Reviewed: 07/18/2014 Elsevier Interactive Patient Education  2017 [employer].

## 2017-12-11 NOTE — Progress Notes (Signed)
Subjective:   Margaret [name] is a 84 y.o. female who presents for Medicare Annual (Subsequent) preventive examination at [facility]; incapacitated patient unable to answer questions appropriately   Last AWV-12/09/2016       Objective:     Vitals: BP (!) 142/84 (BP Location: Left Arm, Patient Position: Supine)   Pulse 80   Temp 98.1 F (36.7 C) (Oral)   Ht 4\' 11"  (1.499 m)   Wt 94 lb (42.6 kg)   LMP  (LMP Unknown)   BMI 18.99 kg/m   Body mass index is 18.99 kg/m.  Advanced Directives 12/11/2017 11/30/2017 11/08/2017 11/07/2017 10/17/2017 09/14/2017 07/24/2017  Does Patient Have a Medical Advance Directive? No No No No No No No  Would patient like information on creating a medical advance directive? No - Patient declined - - - - - -  Pre-existing out of facility DNR order (yellow form or pink MOST form) - - - - - - -    Tobacco Social History   Tobacco Use  Smoking Status Former Smoker  Smokeless Tobacco Never Used     Counseling given: Not Answered   Clinical Intake:  Pre-visit preparation completed: No  Pain : Faces Faces Pain Scale: No hurt  Faces Pain Scale: No hurt  Nutritional Risks: None Diabetes: Yes CBG done?: No Did pt. bring in CBG monitor from home?: No  How often do you need to have someone help you when you read instructions, pamphlets, or other written materials from your doctor or pharmacy?: 3 - Sometimes     Information entered by :: Tyron Russell, RN  Past Medical History:  Diagnosis Date  . Breast mass, right 12/13/2013  . Chronic diastolic CHF (congestive heart failure) (HCC) 07/19/2013  . Chronic diastolic heart failure (HCC)   . Chronic kidney disease, stage II (mild)   . Chronic renal disease, stage 2, mildly decreased glomerular filtration rate (GFR) between 60-89 mL/min/1.73 square meter 09/03/2014  . Closed T12 fracture (HCC) 07/16/2013  . Depression 07/18/2013  . Diabetes mellitus   . Diabetes mellitus due to underlying  condition (HCC) 06/21/2013  . Diabetes mellitus without complication (HCC)   . Dysphagia, oropharyngeal phase   . GERD (gastroesophageal reflux disease) 12/17/2014  . Gout   . Hypertension   . Hypertensive heart disease with congestive heart failure (HCC) 06/21/2013  . Iron deficiency anemia 09/03/2014  . Iron deficiency anemia, unspecified   . Leukocytosis 07/18/2013  . Lumbago 10/23/2013  . Lung nodule < 6cm on CT 11/13/2016  . Osteopenia 07/16/2013  . Syncope 06/21/2013  . Type 2 diabetes, controlled, with neuropathy (HCC) 10/18/2013  . Unspecified constipation 10/18/2013  . Vitamin D deficiency 04/11/2016   Past Surgical History:  Procedure Laterality Date  . NO PAST SURGERIES     No family history on file. Social History   Socioeconomic History  . Marital status: Single    Spouse name: Not on file  . Number of children: Not on file  . Years of education: Not on file  . Highest education level: Not on file  Occupational History  . Occupation: Domestic  Engineer, production  . Financial resource strain: Not on file  . Food insecurity:    Worry: Not on file    Inability: Not on file  . Transportation needs:    Medical: Not on file    Non-medical: Not on file  Tobacco Use  . Smoking status: Former Games developer  . Smokeless tobacco: Never Used  Substance and Sexual Activity  . Alcohol use: No  . Drug use: No  . Sexual activity: Not Currently  Lifestyle  . Physical activity:    Days per week: Not on file    Minutes per session: Not on file  . Stress: Not on file  Relationships  . Social connections:    Talks on phone: Not on file    Gets together: Not on file    Attends religious service: Not on file    Active member of club or organization: Not on file    Attends meetings of clubs or organizations: Not on file    Relationship status: Not on file  Other Topics Concern  . Not on file  Social History Narrative   ** Merged History Encounter **       ** Merged History  Encounter **      Admitted to [facility] 07/22/13   Never married   Former smoker   Alcohol none   Full Code           Outpatient Encounter Medications as of 12/11/2017  Medication Sig  . aspirin 81 MG chewable tablet Chew 1 tablet (81 mg total) by mouth daily.  . ferrous sulfate 325 (65 FE) MG tablet Take 325 mg by mouth daily with breakfast.   . furosemide (LASIX) 40 MG tablet Take 40 mg by mouth every morning.   . gabapentin (NEURONTIN) 300 MG capsule Take 1 capsule (300 mg total) by mouth at bedtime.  Marland Kitchen ipratropium-albuterol (DUONEB) 0.5-2.5 (3) MG/3ML SOLN Take 3 mLs by nebulization every 6 (six) hours as needed.  Marland Kitchen losartan (COZAAR) 25 MG tablet Take 25 mg by mouth every morning. For HTN  . metFORMIN (GLUCOPHAGE-XR) 500 MG 24 hr tablet Take 500 mg by mouth every evening. For diabetes  . NUTRITIONAL SUPPLEMENT LIQD Take by mouth. Magic cup-take one cup with dinner as supplement  . omeprazole (PRILOSEC) 20 MG capsule Take 20 mg by mouth daily. For GERD  . potassium chloride (K-DUR) 10 MEQ tablet Take 30 mEq by mouth daily. Take 3 tablets potassium supplement  . traMADol (ULTRAM) 50 MG tablet Take 50 mg by mouth 2 (two) times daily.  . Vitamin D, Ergocalciferol, (DRISDOL) 50000 units CAPS capsule Take 50,000 Units by mouth every 7 (seven) days.   No facility-administered encounter medications on file as of 12/11/2017.     Activities of Daily Living In your present state of health, do you have any difficulty performing the following activities: 12/11/2017  Hearing? N  Vision? N  Difficulty concentrating or making decisions? Y  Walking or climbing stairs? Y  Dressing or bathing? Y  Doing errands, shopping? Y  Preparing Food and eating ? Y  Using the Toilet? Y  In the past six months, have you accidently leaked urine? Y  Do you have problems with loss of bowel control? Y  Managing your Medications? Y  Managing your Finances? Y  Housekeeping or managing your Housekeeping? Y    Some recent data might Parissa hidden    [name], MD as PCP - General (Internal Medicine)    Assessment:   This is a routine wellness examination for Vista.  Exercise Activities and Dietary recommendations Current Exercise Habits: The patient does not participate in regular exercise at present, Exercise limited by: orthopedic condition(s)  Goals    None      Fall Risk Fall Risk  12/11/2017 12/12/2016 12/09/2016  Falls in the past year? No  Yes Yes  Comment - 10/09/16 -  Number falls in past yr: - - 1  Injury with Fall? - - Yes   Is the patient's home free of loose throw rugs in walkways, pet beds, electrical cords, etc?   yes      Grab bars in the bathroom? yes      Handrails on the stairs?   yes      Adequate lighting?   yes  Depression Screen PHQ 2/9 Scores 12/11/2017 12/09/2016  PHQ - 2 Score - 0  Exception Documentation Medical reason -     Cognitive Function MMSE - Mini Mental State Exam 12/11/2017  Not completed: Unable to complete        Immunization History  Administered Date(s) Administered  . Influenza-Unspecified 03/23/2015, 04/06/2016, 04/06/2017  . PPD Test 08/12/2013  . Pneumococcal Polysaccharide-23 06/23/2013    Qualifies for Shingles Vaccine? Not in past records  Screening Tests Health Maintenance  Topic Date Due  . OPHTHALMOLOGY EXAM  05/03/2017  . FOOT EXAM  09/14/2017  . DEXA SCAN  06/28/2023 (Originally 06/27/1999)  . TETANUS/TDAP  06/28/2023 (Originally 06/26/1951)  . PNA vac Low Risk Adult (2 of 2 - PCV13) 06/28/2023 (Originally 06/23/2014)  . HEMOGLOBIN A1C  01/24/2018  . INFLUENZA VACCINE  01/25/2018    Cancer Screenings: Lung: Low Dose CT Chest recommended if Age 34-80 years, 30 pack-year currently smoking OR have quit w/in 15years. Patient does not qualify. Breast:  Up to date on Mammogram? Yes   Up to date of Bone Density/Dexa? No, ordered Colorectal: up to date  Additional Screenings:  Hepatitis C  Screening: declined Diabetic Eye exam due: ordered TDAP due: ordered Prevnar due: ordered    Plan:    I have personally reviewed and addressed the Medicare Annual Wellness questionnaire and have noted the following in the patient's chart:  A. Medical and social history B. Use of alcohol, tobacco or illicit drugs  C. Current medications and supplements D. Functional ability and status E.  Nutritional status F.  Physical activity G. Advance directives H. List of other physicians I.  Hospitalizations, surgeries, and ER visits in previous 12 months J.  Vitals K. Screenings to include hearing, vision, cognitive, depression L. Referrals and appointments - none  In addition, I am unable to review and discuss with incapacitated patient certain preventive protocols, quality metrics, and best practice recommendations. A written personalized care plan for preventive services as well as general preventive health recommendations were provided to patient.  See attached scanned questionnaire for additional information.   Signed,   Tyron RussellSara Rhone Ozaki, RN Nurse Health Advisor  Patient Concerns: None

## 2017-12-12 ENCOUNTER — Other Ambulatory Visit: Payer: Self-pay | Admitting: Internal Medicine

## 2017-12-12 DIAGNOSIS — E2839 Other primary ovarian failure: Secondary | ICD-10-CM

## 2017-12-12 DIAGNOSIS — M6281 Muscle weakness (generalized): Secondary | ICD-10-CM | POA: Diagnosis not present

## 2017-12-12 DIAGNOSIS — I129 Hypertensive chronic kidney disease with stage 1 through stage 4 chronic kidney disease, or unspecified chronic kidney disease: Secondary | ICD-10-CM | POA: Diagnosis not present

## 2017-12-12 DIAGNOSIS — N182 Chronic kidney disease, stage 2 (mild): Secondary | ICD-10-CM | POA: Diagnosis not present

## 2017-12-13 DIAGNOSIS — N182 Chronic kidney disease, stage 2 (mild): Secondary | ICD-10-CM | POA: Diagnosis not present

## 2017-12-13 DIAGNOSIS — M6281 Muscle weakness (generalized): Secondary | ICD-10-CM | POA: Diagnosis not present

## 2017-12-13 DIAGNOSIS — I129 Hypertensive chronic kidney disease with stage 1 through stage 4 chronic kidney disease, or unspecified chronic kidney disease: Secondary | ICD-10-CM | POA: Diagnosis not present

## 2017-12-15 DIAGNOSIS — I129 Hypertensive chronic kidney disease with stage 1 through stage 4 chronic kidney disease, or unspecified chronic kidney disease: Secondary | ICD-10-CM | POA: Diagnosis not present

## 2017-12-15 DIAGNOSIS — M6281 Muscle weakness (generalized): Secondary | ICD-10-CM | POA: Diagnosis not present

## 2017-12-15 DIAGNOSIS — N182 Chronic kidney disease, stage 2 (mild): Secondary | ICD-10-CM | POA: Diagnosis not present

## 2017-12-18 DIAGNOSIS — M6281 Muscle weakness (generalized): Secondary | ICD-10-CM | POA: Diagnosis not present

## 2017-12-18 DIAGNOSIS — N182 Chronic kidney disease, stage 2 (mild): Secondary | ICD-10-CM | POA: Diagnosis not present

## 2017-12-18 DIAGNOSIS — I129 Hypertensive chronic kidney disease with stage 1 through stage 4 chronic kidney disease, or unspecified chronic kidney disease: Secondary | ICD-10-CM | POA: Diagnosis not present

## 2017-12-19 DIAGNOSIS — N182 Chronic kidney disease, stage 2 (mild): Secondary | ICD-10-CM | POA: Diagnosis not present

## 2017-12-19 DIAGNOSIS — I129 Hypertensive chronic kidney disease with stage 1 through stage 4 chronic kidney disease, or unspecified chronic kidney disease: Secondary | ICD-10-CM | POA: Diagnosis not present

## 2017-12-19 DIAGNOSIS — M6281 Muscle weakness (generalized): Secondary | ICD-10-CM | POA: Diagnosis not present

## 2017-12-20 DIAGNOSIS — M6281 Muscle weakness (generalized): Secondary | ICD-10-CM | POA: Diagnosis not present

## 2017-12-20 DIAGNOSIS — I129 Hypertensive chronic kidney disease with stage 1 through stage 4 chronic kidney disease, or unspecified chronic kidney disease: Secondary | ICD-10-CM | POA: Diagnosis not present

## 2017-12-20 DIAGNOSIS — N182 Chronic kidney disease, stage 2 (mild): Secondary | ICD-10-CM | POA: Diagnosis not present

## 2017-12-21 DIAGNOSIS — I129 Hypertensive chronic kidney disease with stage 1 through stage 4 chronic kidney disease, or unspecified chronic kidney disease: Secondary | ICD-10-CM | POA: Diagnosis not present

## 2017-12-21 DIAGNOSIS — N182 Chronic kidney disease, stage 2 (mild): Secondary | ICD-10-CM | POA: Diagnosis not present

## 2017-12-21 DIAGNOSIS — M6281 Muscle weakness (generalized): Secondary | ICD-10-CM | POA: Diagnosis not present

## 2017-12-22 DIAGNOSIS — I129 Hypertensive chronic kidney disease with stage 1 through stage 4 chronic kidney disease, or unspecified chronic kidney disease: Secondary | ICD-10-CM | POA: Diagnosis not present

## 2017-12-22 DIAGNOSIS — N182 Chronic kidney disease, stage 2 (mild): Secondary | ICD-10-CM | POA: Diagnosis not present

## 2017-12-22 DIAGNOSIS — M6281 Muscle weakness (generalized): Secondary | ICD-10-CM | POA: Diagnosis not present

## 2017-12-24 ENCOUNTER — Encounter: Payer: Self-pay | Admitting: Internal Medicine

## 2017-12-24 NOTE — Assessment & Plan Note (Signed)
Rested well controlled; on no meds monitor at intervals

## 2017-12-24 NOTE — Assessment & Plan Note (Signed)
Stable without complaints; continue Neurontin 300 mg nightly

## 2017-12-24 NOTE — Assessment & Plan Note (Signed)
No exacerbations in many many months; plan to continue Lasix 40 mg daily and Cozaar 25 mg daily

## 2017-12-25 DIAGNOSIS — E559 Vitamin D deficiency, unspecified: Secondary | ICD-10-CM | POA: Diagnosis not present

## 2017-12-25 DIAGNOSIS — I129 Hypertensive chronic kidney disease with stage 1 through stage 4 chronic kidney disease, or unspecified chronic kidney disease: Secondary | ICD-10-CM | POA: Diagnosis not present

## 2017-12-25 DIAGNOSIS — N182 Chronic kidney disease, stage 2 (mild): Secondary | ICD-10-CM | POA: Diagnosis not present

## 2017-12-25 DIAGNOSIS — M6281 Muscle weakness (generalized): Secondary | ICD-10-CM | POA: Diagnosis not present

## 2017-12-25 LAB — VITAMIN D 25 HYDROXY (VIT D DEFICIENCY, FRACTURES): Vit D, 25-Hydroxy: 12.02

## 2017-12-26 NOTE — Progress Notes (Signed)
7/11 9

## 2017-12-27 ENCOUNTER — Encounter: Payer: Self-pay | Admitting: Internal Medicine

## 2017-12-27 ENCOUNTER — Non-Acute Institutional Stay (SKILLED_NURSING_FACILITY): Payer: Medicare Other | Admitting: Internal Medicine

## 2017-12-27 DIAGNOSIS — I5022 Chronic systolic (congestive) heart failure: Secondary | ICD-10-CM

## 2017-12-27 DIAGNOSIS — E1140 Type 2 diabetes mellitus with diabetic neuropathy, unspecified: Secondary | ICD-10-CM | POA: Diagnosis not present

## 2017-12-27 DIAGNOSIS — I110 Hypertensive heart disease with heart failure: Secondary | ICD-10-CM | POA: Diagnosis not present

## 2017-12-27 DIAGNOSIS — K219 Gastro-esophageal reflux disease without esophagitis: Secondary | ICD-10-CM

## 2017-12-27 NOTE — Progress Notes (Signed)
[facility] Room Number: 201-D Place of Service:  SNF (31)  [name], Ryeleigh Santore D, MD  Patient Care Team: [name], Chaun Uemura D, MD as PCP - General (Internal Medicine)  Extended Emergency Contact Information Primary Emergency Contact: Fadness,[name] Address: [address]          [place], [place] [address] [place] Home Phone: [phone] Mobile Phone: [phone] Relation: Sister Secondary Emergency Contact: [name],[name] Address: [address]          [place], [place] [address] [place] Home Phone: [phone] Relation: Grandaughter    Allergies: Tequin [gatifloxacin]  Chief Complaint  Patient presents with  . Medical Management of Chronic Issues    Routine Visit    HPI: Patient is 84 y.o. female who is seen for routine issues of hypertension, diabetes mellitus type 2, and GERD.  Past Medical History:  Diagnosis Date  . Breast mass, right 12/13/2013  . Chronic diastolic CHF (congestive heart failure) (HCC) 07/19/2013  . Chronic diastolic heart failure (HCC)   . Chronic kidney disease, stage II (mild)   . Chronic renal disease, stage 2, mildly decreased glomerular filtration rate (GFR) between 60-89 mL/min/1.73 square meter 09/03/2014  . Closed T12 fracture (HCC) 07/16/2013  . Depression 07/18/2013  . Diabetes mellitus   . Diabetes mellitus due to underlying condition (HCC) 06/21/2013  . Diabetes mellitus without complication (HCC)   . Dysphagia, oropharyngeal phase   . GERD (gastroesophageal reflux disease) 12/17/2014  . Gout   . Hypertension   . Hypertensive heart disease with congestive heart failure (HCC) 06/21/2013  . Iron deficiency anemia 09/03/2014  . Iron deficiency anemia, unspecified   . Leukocytosis 07/18/2013  . Lumbago 10/23/2013  . Lung nodule < 6cm on CT 11/13/2016  . Osteopenia 07/16/2013  . Syncope 06/21/2013  . Type 2 diabetes, controlled, with neuropathy (HCC) 10/18/2013  .  Unspecified constipation 10/18/2013  . Vitamin D deficiency 04/11/2016    Past Surgical History:  Procedure Laterality Date  . NO PAST SURGERIES      Allergies as of 12/27/2017      Reactions   Tequin [gatifloxacin]       Medication List        Accurate as of 12/27/17 11:59 PM. Always use your most recent med list.          aspirin 81 MG chewable tablet Chew 1 tablet (81 mg total) by mouth daily.   ferrous sulfate 325 (65 FE) MG tablet Take 325 mg by mouth daily with breakfast.   furosemide 40 MG tablet Commonly known as:  LASIX Take 40 mg by mouth every morning.   gabapentin 300 MG capsule Commonly known as:  NEURONTIN Take 1 capsule (300 mg total) by mouth at bedtime.   ipratropium-albuterol 0.5-2.5 (3) MG/3ML Soln Commonly known as:  DUONEB Take 3 mLs by nebulization every 6 (six) hours as needed.   losartan 25 MG tablet Commonly known as:  COZAAR Take 25 mg by mouth every morning. For HTN   metFORMIN 500 MG 24 hr tablet Commonly known as:  GLUCOPHAGE-XR Take 500 mg by mouth every evening. For diabetes   NUTRITIONAL SUPPLEMENT Liqd Take by mouth. Magic cup-take one cup with dinner as supplement   omeprazole 20 MG capsule Commonly known as:  PRILOSEC Take 20 mg by mouth daily. For GERD   potassium chloride 10 MEQ tablet Commonly known as:  K-DUR Take 30 mEq by mouth daily. Take 3  tablets potassium supplement   traMADol 50 MG tablet Commonly known as:  ULTRAM Take 50 mg by mouth 2 (two) times daily.       No orders of the defined types were placed in this encounter.   Immunization History  Administered Date(s) Administered  . Influenza-Unspecified 03/23/2015, 04/06/2016, 04/06/2017  . PPD Test 08/12/2013  . Pneumococcal Polysaccharide-23 06/23/2013    Social History   Tobacco Use  . Smoking status: Former Games developer  . Smokeless tobacco: Never Used  Substance Use Topics  . Alcohol use: No    Review of Systems  DATA OBTAINED: from  nurse GENERAL:  no fevers, fatigue, appetite changes SKIN: No itching, rash HEENT: No complaint RESPIRATORY: No cough, wheezing, SOB CARDIAC: No chest pain, palpitations, lower extremity edema  GI: No abdominal pain, No N/V/D or constipation, No heartburn or reflux  GU: No dysuria, frequency or urgency, or incontinence  MUSCULOSKELETAL: No unrelieved bone/joint pain NEUROLOGIC: No headache, dizziness  PSYCHIATRIC: No overt anxiety or sadness  Vitals:   12/27/17 1225  BP: 135/78  Pulse: 72  Resp: 18  Temp: (!) 97.5 F (36.4 C)   Body mass index is 20.43 kg/m. Physical Exam  GENERAL APPEARANCE: Alert, conversant, No acute distress  SKIN: No diaphoresis rash HEENT: Unremarkable RESPIRATORY: Breathing is even, unlabored. Lung sounds are clear   CARDIOVASCULAR: Heart RRR no murmurs, rubs or gallops. No peripheral edema  GASTROINTESTINAL: Abdomen is soft, non-tender, not distended w/ normal bowel sounds.  GENITOURINARY: Bladder non tender, not distended  MUSCULOSKELETAL: No abnormal joints or musculature NEUROLOGIC: Cranial nerves 2-12 grossly intact. Moves all extremities PSYCHIATRIC: Mood and affect appropriate to situation, no behavioral issues  Patient Active Problem List   Diagnosis Date Noted  . Lung nodule < 6cm on CT 11/13/2016  . Vitamin D deficiency 04/11/2016  . Hyperlipidemia 02/11/2016  . Pain 11/07/2015  . Wheezing 11/07/2015  . Edema 10/25/2015  . Conjunctivitis 07/09/2015  . UTI (urinary tract infection) 03/24/2015  . GERD (gastroesophageal reflux disease) 12/17/2014  . Hypokalemia 12/17/2014  . Depression 11/25/2014  . Iron deficiency anemia 09/03/2014  . Chronic renal disease, stage 2, mildly decreased glomerular filtration rate (GFR) between 60-89 mL/min/1.73 square meter 09/03/2014  . Pain in joint, lower leg 08/21/2014  . Cough 01/27/2014  . Dizziness 01/12/2014  . Contusion of unspecified site 01/12/2014  . Breast mass, right 12/13/2013  .  Lumbago 10/23/2013  . Type 2 diabetes, controlled, with neuropathy (HCC) 10/18/2013  . 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 Unspecified constipation 10/18/2013  . Plantar fasciitis, left 07/29/2013  . Physical deconditioning 07/21/2013  . Diabetes mellitus, controlled (HCC) 07/19/2013  . Chronic diastolic CHF (congestive heart failure) (HCC) 07/19/2013  . HCAP (healthcare-associated pneumonia) 07/18/2013  . Chronic diastolic heart failure (HCC) 06/24/2013  . Syncope 06/21/2013  . Syncope and collapse 06/21/2013  . Leukocytosis 06/21/2013  . Hypertensive heart disease with congestive heart failure (HCC) 06/21/2013  . Diabetes mellitus due to underlying condition (HCC) 06/21/2013    CMP     Component Value Date/Time   NA 144 07/27/2017   K 3.9 07/27/2017   CL 106 10/09/2016 1425   CO2 26 10/09/2016 1425   GLUCOSE 97 10/09/2016 1425   BUN 20 07/27/2017   CREATININE 1.0 07/27/2017   CREATININE 0.82 10/09/2016 1425   CALCIUM 8.8 (L) 10/09/2016 1425   PROT 6.4 (L) 10/09/2016 1425   ALBUMIN 3.4 (L) 10/09/2016 1425   AST 21 07/27/2017  ALT 13 07/27/2017   ALKPHOS 99 07/27/2017   BILITOT 0.5 10/09/2016 1425   GFRNONAA >60 10/09/2016 1425   GFRAA >60 10/09/2016 1425   Recent Labs    01/16/17 02/15/17 07/27/17  NA 145 144 144  K 3.6 4.4 3.9  BUN 19 16 20   CREATININE 0.9 1.0 1.0   Recent Labs    01/16/17 02/15/17 07/27/17  AST 18 27 21   ALT 9 15 13   ALKPHOS 85 112 99   Recent Labs    01/16/17 02/15/17 07/27/17  WBC 5.9 4.6 5.2  HGB 12.2 14.1 11.3*  HCT 37 43 33*  PLT 180 239 206   Recent Labs    01/16/17 07/27/17  CHOL 166 160  LDLCALC 97 86  TRIG 100 79   No results found for: MICROALBUR Lab Results  Component Value Date   TSH 0.36 (A) 07/27/2017   Lab Results  Component Value Date   HGBA1C 6.2 07/27/2017   Lab Results  Component Value Date   CHOL 160 07/27/2017   HDL 58 07/27/2017   LDLCALC 86 07/27/2017    TRIG 79 07/27/2017   CHOLHDL 3.2 06/22/2013    Significant Diagnostic Results in last 30 days:  No results found.  Assessment and Plan  Hypertensive heart disease with congestive heart failure Controlled on Cozaar 25 mg daily and Lasix 40 mg daily; continue current regimen  Type 2 diabetes, controlled, with neuropathy A1c 6.2 on Glucophage 500 mg every morning; patient is on a ARB, not on statin but LDL is 86 on statin with HDL of 58  GERD (gastroesophageal reflux disease) Problems reported; continue omeprazole 20 mg daily    Sunny Gains D. Lyn Hollingshead, MD

## 2018-01-09 ENCOUNTER — Encounter: Payer: Self-pay | Admitting: Internal Medicine

## 2018-01-09 NOTE — Assessment & Plan Note (Signed)
A1c 6.2 on Glucophage 500 mg every morning; patient is on a ARB, not on statin but LDL is 86 on statin with HDL of 58

## 2018-01-09 NOTE — Assessment & Plan Note (Signed)
Problems reported; continue omeprazole 20 mg daily

## 2018-01-09 NOTE — Assessment & Plan Note (Signed)
Controlled on Cozaar 25 mg daily and Lasix 40 mg daily; continue current regimen

## 2018-01-22 ENCOUNTER — Other Ambulatory Visit: Payer: Medicare Other

## 2018-02-01 ENCOUNTER — Encounter: Payer: Self-pay | Admitting: Internal Medicine

## 2018-02-01 ENCOUNTER — Non-Acute Institutional Stay (SKILLED_NURSING_FACILITY): Payer: Medicare Other | Admitting: Internal Medicine

## 2018-02-01 DIAGNOSIS — D508 Other iron deficiency anemias: Secondary | ICD-10-CM | POA: Diagnosis not present

## 2018-02-01 DIAGNOSIS — I5032 Chronic diastolic (congestive) heart failure: Secondary | ICD-10-CM | POA: Diagnosis not present

## 2018-02-01 DIAGNOSIS — E1142 Type 2 diabetes mellitus with diabetic polyneuropathy: Secondary | ICD-10-CM

## 2018-02-01 NOTE — Progress Notes (Signed)
Location:  [facility] Room Number: 201D Place of Service:  SNF [phone]  Margaret Goldsmithnne D. Lyn HollingsheadAlexander, MD  Patient Care Team: Margaret HanksAlexander, Margaret [name] D, MD as PCP - General (Internal Medicine)  Extended Emergency Contact Information Primary Emergency Contact: Margaret [name] Address: [address]          [place], [place] [address] [place] Home Phone: [phone] Mobile Phone: [phone] Relation: Sister Secondary Emergency Contact: Margaret [name] Address: [address]          [place], [place] [address] [place] Home Phone: [phone] Relation: Granddaughter    Allergies: Tequin [gatifloxacin]  Chief Complaint  Patient presents with  . Medical Management of Chronic Issues    Routine Visit    HPI: Patient is [age] y.o. female who is being seen for routine issues of diabetes mellitus, congestive heart failure, and iron deficiency anemia.  Past Medical History:  Diagnosis Date  . Breast mass, right 12/13/2013  . Chronic diastolic CHF (congestive heart failure) (HCC) 07/19/2013  . Chronic diastolic heart failure (HCC)   . Chronic kidney disease, stage II (mild)   . Chronic renal disease, stage 2, mildly decreased glomerular filtration rate (GFR) between 60-89 mL/min/1.73 square meter 09/03/2014  . Closed T12 fracture (HCC) 07/16/2013  . Depression 07/18/2013  . Diabetes mellitus   . Diabetes mellitus due to underlying condition (HCC) 06/21/2013  . Diabetes mellitus without complication (HCC)   . Dysphagia, oropharyngeal phase   . GERD (gastroesophageal reflux disease) 12/17/2014  . Gout   . Hypertension   . Hypertensive heart disease with congestive heart failure (HCC) 06/21/2013  . Iron deficiency anemia 09/03/2014  . Iron deficiency anemia, unspecified   . Leukocytosis 07/18/2013  . Lumbago 10/23/2013  . Lung nodule < 6cm on CT 11/13/2016  . Osteopenia 07/16/2013  . Syncope 06/21/2013  . Type 2 diabetes, controlled, with  neuropathy (HCC) 10/18/2013  . Unspecified constipation 10/18/2013  . Vitamin D deficiency 04/11/2016    Past Surgical History:  Procedure Laterality Date  . NO PAST SURGERIES      Allergies as of 02/01/2018      Reactions   Tequin [gatifloxacin]       Medication List        Accurate as of 02/01/18 11:59 PM. Always use your most recent med list.          aspirin 81 MG chewable tablet Chew 1 tablet (81 mg total) by mouth daily.   ferrous sulfate 325 (65 FE) MG tablet Take 325 mg by mouth daily with breakfast.   furosemide 40 MG tablet Commonly known as:  LASIX Take 40 mg by mouth every morning.   gabapentin 300 MG capsule Commonly known as:  NEURONTIN Take 1 capsule (300 mg total) by mouth at bedtime.   ipratropium-albuterol 0.5-2.5 (3) MG/3ML Soln Commonly known as:  DUONEB Take 3 mLs by nebulization every 6 (six) hours as needed.   losartan 25 MG tablet Commonly known as:  COZAAR Take 25 mg by mouth every morning. For HTN   metFORMIN 500 MG 24 hr tablet Commonly known as:  GLUCOPHAGE-XR Take 500 mg by mouth every evening. For diabetes   NUTRITIONAL SUPPLEMENT Liqd Take by mouth. Magic cup-take one cup with dinner as supplement   omeprazole 20 MG capsule Commonly known as:  PRILOSEC Take 20 mg by mouth daily. For GERD   potassium chloride 10 MEQ tablet Commonly known as:  K-DUR Take 30 mEq by mouth  daily. Take 3 tablets potassium supplement   traMADol 50 MG tablet Commonly known as:  ULTRAM Take 50 mg by mouth 2 (two) times daily.   Vitamin D (Ergocalciferol) 50000 units Caps capsule Commonly known as:  DRISDOL Take 50,000 Units by mouth every 7 (seven) days.       No orders of the defined types were placed in this encounter.   Immunization History  Administered Date(s) Administered  . Influenza-Unspecified 03/23/2015, 04/06/2016, 04/06/2017  . PPD Test 08/12/2013  . Pneumococcal Polysaccharide-23 06/23/2013    Social History   Tobacco Use    . Smoking status: Former Games developer  . Smokeless tobacco: Never Used  Substance Use Topics  . Alcohol use: No    Review of Systems  DATA OBTAINED: from nurse GENERAL:  no fevers, fatigue, appetite changes SKIN: No itching, rash HEENT: No complaint RESPIRATORY: No cough, wheezing, SOB CARDIAC: No chest pain, palpitations, lower extremity edema  GI: No abdominal pain, No N/V/D or constipation, No heartburn or reflux  GU: No dysuria, frequency or urgency, or incontinence  MUSCULOSKELETAL: No unrelieved bone/joint pain NEUROLOGIC: No headache, dizziness  PSYCHIATRIC: No overt anxiety or sadness  Vitals:   02/01/18 1054  BP: 117/81  Pulse: 89  Resp: 18  Temp: 97.6 F (36.4 C)   Body mass index is 20.13 kg/m. Physical Exam  GENERAL APPEARANCE: Alert, minimally conversant, No acute distress  SKIN: No diaphoresis rash HEENT: Unremarkable RESPIRATORY: Breathing is even, unlabored. Lung sounds are clear   CARDIOVASCULAR: Heart RRR no murmurs, rubs or gallops. No peripheral edema  GASTROINTESTINAL: Abdomen is soft, non-tender, not distended w/ normal bowel sounds.  GENITOURINARY: Bladder non tender, not distended  MUSCULOSKELETAL: No abnormal joints or musculature NEUROLOGIC: Cranial nerves 2-12 grossly intact. Moves all extremities PSYCHIATRIC: Mood and affect appropriate to situation, no behavioral issues  Patient Active Problem List   Diagnosis Date Noted  . Lung nodule < 6cm on CT 11/13/2016  . Vitamin D deficiency 04/11/2016  . Hyperlipidemia 02/11/2016  . Pain 11/07/2015  . Wheezing 11/07/2015  . Edema 10/25/2015  . Conjunctivitis 07/09/2015  . UTI (urinary tract infection) 03/24/2015  . GERD (gastroesophageal reflux disease) 12/17/2014  . Hypokalemia 12/17/2014  . Depression 11/25/2014  . Iron deficiency anemia 09/03/2014  . Chronic renal disease, stage 2, mildly decreased glomerular filtration rate (GFR) between 60-89 mL/min/1.73 square meter 09/03/2014  . Pain in  joint, lower leg 08/21/2014  . Cough 01/27/2014  . Dizziness 01/12/2014  . Contusion of unspecified site 01/12/2014  . Breast mass, right 12/13/2013  . Lumbago 10/23/2013  . Type 2 diabetes, controlled, with neuropathy (HCC) 10/18/2013  . 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 Unspecified constipation 10/18/2013  . Plantar fasciitis, left 07/29/2013  . Physical deconditioning 07/21/2013  . Diabetes mellitus, controlled (HCC) 07/19/2013  . Chronic diastolic CHF (congestive heart failure) (HCC) 07/19/2013  . HCAP (healthcare-associated pneumonia) 07/18/2013  . Chronic diastolic heart failure (HCC) 06/24/2013  . Syncope 06/21/2013  . Syncope and collapse 06/21/2013  . Leukocytosis 06/21/2013  . Hypertensive heart disease with congestive heart failure (HCC) 06/21/2013  . Diabetes mellitus due to underlying condition (HCC) 06/21/2013    CMP     Component Value Date/Time   NA 144 07/27/2017   K 3.9 07/27/2017   CL 106 10/09/2016 1425   CO2 26 10/09/2016 1425   GLUCOSE 97 10/09/2016 1425   BUN 20 07/27/2017   CREATININE 1.0 07/27/2017   CREATININE 0.82 10/09/2016 1425  CALCIUM 8.8 (L) 10/09/2016 1425   PROT 6.4 (L) 10/09/2016 1425   ALBUMIN 3.4 (L) 10/09/2016 1425   AST 21 07/27/2017   ALT 13 07/27/2017   ALKPHOS 99 07/27/2017   BILITOT 0.5 10/09/2016 1425   GFRNONAA >60 10/09/2016 1425   GFRAA >60 10/09/2016 1425   Recent Labs    02/15/17 07/27/17  NA 144 144  K 4.4 3.9  BUN 16 20  CREATININE 1.0 1.0   Recent Labs    02/15/17 07/27/17  AST 27 21  ALT 15 13  ALKPHOS 112 99   Recent Labs    02/15/17 07/27/17  WBC 4.6 5.2  HGB 14.1 11.3*  HCT 43 33*  PLT 239 206   Recent Labs    07/27/17  CHOL 160  LDLCALC 86  TRIG 79   No results found for: MICROALBUR Lab Results  Component Value Date   TSH 0.36 (A) 07/27/2017   Lab Results  Component Value Date   HGBA1C 6.2 07/27/2017   Lab Results  Component Value  Date   CHOL 160 07/27/2017   HDL 58 07/27/2017   LDLCALC 86 07/27/2017   TRIG 79 07/27/2017   CHOLHDL 3.2 06/22/2013    Significant Diagnostic Results in last 30 days:  No results found.  Assessment and Plan  Diabetes mellitus, controlled A1c is 6.2; continue Glucophage 500 mg every morning; patient is on ARB  Chronic diastolic CHF (congestive heart failure) No reported exacerbations; continue Cozaar 25 mg daily and Lasix 40 mg daily  Iron deficiency anemia Hemoglobin 11.7, stable from prior; continue iron 325 p.o. daily   Margaret [name] D. Lyn Hollingshead, MD

## 2018-02-11 ENCOUNTER — Encounter: Payer: Self-pay | Admitting: Internal Medicine

## 2018-02-11 NOTE — Assessment & Plan Note (Signed)
Hemoglobin 11.7, stable from prior; continue iron 325 p.o. daily

## 2018-02-11 NOTE — Assessment & Plan Note (Signed)
No reported exacerbations; continue Cozaar 25 mg daily and Lasix 40 mg daily

## 2018-02-11 NOTE — Assessment & Plan Note (Signed)
A1c is 6.2; continue Glucophage 500 mg every morning; patient is on ARB

## 2018-02-27 DIAGNOSIS — M6281 Muscle weakness (generalized): Secondary | ICD-10-CM | POA: Diagnosis not present

## 2018-02-27 DIAGNOSIS — Z9181 History of falling: Secondary | ICD-10-CM | POA: Diagnosis not present

## 2018-02-27 DIAGNOSIS — R2681 Unsteadiness on feet: Secondary | ICD-10-CM | POA: Diagnosis not present

## 2018-02-28 DIAGNOSIS — Z9181 History of falling: Secondary | ICD-10-CM | POA: Diagnosis not present

## 2018-02-28 DIAGNOSIS — M6281 Muscle weakness (generalized): Secondary | ICD-10-CM | POA: Diagnosis not present

## 2018-02-28 DIAGNOSIS — R2681 Unsteadiness on feet: Secondary | ICD-10-CM | POA: Diagnosis not present

## 2018-03-01 ENCOUNTER — Non-Acute Institutional Stay (SKILLED_NURSING_FACILITY): Payer: Medicare Other | Admitting: Internal Medicine

## 2018-03-01 ENCOUNTER — Encounter: Payer: Self-pay | Admitting: Internal Medicine

## 2018-03-01 DIAGNOSIS — E559 Vitamin D deficiency, unspecified: Secondary | ICD-10-CM | POA: Diagnosis not present

## 2018-03-01 DIAGNOSIS — E1142 Type 2 diabetes mellitus with diabetic polyneuropathy: Secondary | ICD-10-CM

## 2018-03-01 DIAGNOSIS — M6281 Muscle weakness (generalized): Secondary | ICD-10-CM | POA: Diagnosis not present

## 2018-03-01 DIAGNOSIS — E1140 Type 2 diabetes mellitus with diabetic neuropathy, unspecified: Secondary | ICD-10-CM | POA: Diagnosis not present

## 2018-03-01 DIAGNOSIS — Z9181 History of falling: Secondary | ICD-10-CM | POA: Diagnosis not present

## 2018-03-01 DIAGNOSIS — R2681 Unsteadiness on feet: Secondary | ICD-10-CM | POA: Diagnosis not present

## 2018-03-01 NOTE — Progress Notes (Signed)
Location:  [facility] Room Number: 201D Place of Service:  SNF [phone]  Margaret [name]. Lyn Hollingshead, MD  [name], MD as PCP - General (Internal Medicine)  Extended Emergency Contact Information Primary Emergency Contact: Hartman,[name] Address: [address]          [place], [place] [address] [name] Home Phone: [phone] Mobile Phone: [phone] Relation: Sister Secondary Emergency Contact: [name],[name] Address: [address]          [place], [place] [address] [name] Home Phone: [phone] Relation: Granddaughter    Allergies: Tequin [gatifloxacin]  Chief Complaint  Patient presents with  . Medical Management of Chronic Issues    Routine Visit    HPI: Patient is 84 y.o. female who is being seen for routine issues of vitamin D deficiency, diabetes mellitus type 2 and diabetic peripheral neuropathy.  Past Medical History:  Diagnosis Date  . Breast mass, right 12/13/2013  . Chronic diastolic CHF (congestive heart failure) (HCC) 07/19/2013  . Chronic diastolic heart failure (HCC)   . Chronic kidney disease, stage II (mild)   . Chronic renal disease, stage 2, mildly decreased glomerular filtration rate (GFR) between 60-89 mL/min/1.73 square meter 09/03/2014  . Closed T12 fracture (HCC) 07/16/2013  . Depression 07/18/2013  . Diabetes mellitus   . Diabetes mellitus due to underlying condition (HCC) 06/21/2013  . Diabetes mellitus without complication (HCC)   . Dysphagia, oropharyngeal phase   . GERD (gastroesophageal reflux disease) 12/17/2014  . Gout   . Hypertension   . Hypertensive heart disease with congestive heart failure (HCC) 06/21/2013  . Iron deficiency anemia 09/03/2014  . Iron deficiency anemia, unspecified   . Leukocytosis 07/18/2013  . Lumbago 10/23/2013  . Lung nodule < 6cm on CT 11/13/2016  . Osteopenia 07/16/2013  . Syncope 06/21/2013  . Type 2 diabetes, controlled,  with neuropathy (HCC) 10/18/2013  . Unspecified constipation 10/18/2013  . Vitamin D deficiency 04/11/2016    Past Surgical History:  Procedure Laterality Date  . NO PAST SURGERIES      Allergies as of 03/01/2018      Reactions   Tequin [gatifloxacin]       Medication List        Accurate as of 03/01/18 11:59 PM. Always use your most recent med list.          aspirin 81 MG chewable tablet Chew 1 tablet (81 mg total) by mouth daily.   ferrous sulfate 325 (65 FE) MG tablet Take 325 mg by mouth daily with breakfast.   furosemide 40 MG tablet Commonly known as:  LASIX Take 40 mg by mouth every morning.   gabapentin 300 MG capsule Commonly known as:  NEURONTIN Take 1 capsule (300 mg total) by mouth at bedtime.   ipratropium-albuterol 0.5-2.5 (3) MG/3ML Soln Commonly known as:  DUONEB Take 3 mLs by nebulization every 6 (six) hours as needed.   losartan 25 MG tablet Commonly known as:  COZAAR Take 25 mg by mouth every morning. For HTN   metFORMIN 500 MG 24 hr tablet Commonly known as:  GLUCOPHAGE-XR Take 500 mg by mouth every evening. For diabetes   NUTRITIONAL SUPPLEMENT Liqd Take by mouth. Magic cup-take one cup with dinner as supplement   omeprazole 20 MG capsule Commonly known as:  PRILOSEC Take 20 mg by mouth daily. For GERD   potassium chloride 10 MEQ tablet Commonly known as:  K-DUR Take 30 mEq  by mouth daily. Take 3 tablets potassium supplement   traMADol 50 MG tablet Commonly known as:  ULTRAM Take 50 mg by mouth 2 (two) times daily.   Vitamin D (Ergocalciferol) 50000 units Caps capsule Commonly known as:  DRISDOL Take 50,000 Units by mouth every 7 (seven) days.       No orders of the defined types were placed in this encounter.   Immunization History  Administered Date(s) Administered  . Influenza-Unspecified 03/23/2015, 04/06/2016, 04/06/2017  . PPD Test 08/12/2013  . Pneumococcal Polysaccharide-23 06/23/2013    Social History   Tobacco  Use  . Smoking status: Former Games developer  . Smokeless tobacco: Never Used  Substance Use Topics  . Alcohol use: No    Review of Systems  DATA OBTAINED: from nurse GENERAL:  no fevers, fatigue, appetite changes SKIN: No itching, rash HEENT: No complaint RESPIRATORY: No cough, wheezing, SOB CARDIAC: No chest pain, palpitations, lower extremity edema  GI: No abdominal pain, No N/V/D or constipation, No heartburn or reflux  GU: No dysuria, frequency or urgency, or incontinence  MUSCULOSKELETAL: No unrelieved bone/joint pain NEUROLOGIC: No headache, dizziness  PSYCHIATRIC: No overt anxiety or sadness  Vitals:   03/01/18 1119  BP: 132/68  Pulse: 77  Resp: 16  Temp: (!) 97 F (36.1 C)   Body mass index is 17.53 kg/m. Physical Exam  GENERAL APPEARANCE: Alert,  No acute distress  SKIN: No diaphoresis rash HEENT: Unremarkable RESPIRATORY: Breathing is even, unlabored. Lung sounds are clear   CARDIOVASCULAR: Heart RRR no murmurs, rubs or gallops. No peripheral edema  GASTROINTESTINAL: Abdomen is soft, non-tender, not distended w/ normal bowel sounds.  GENITOURINARY: Bladder non tender, not distended  MUSCULOSKELETAL: No abnormal joints or musculature NEUROLOGIC: Cranial nerves 2-12 grossly intact. Moves all extremities PSYCHIATRIC: Mood and affect appropriate to situation, no behavioral issues  Patient Active Problem List   Diagnosis Date Noted  . Lung nodule < 6cm on CT 11/13/2016  . Vitamin D deficiency 04/11/2016  . Hyperlipidemia 02/11/2016  . Pain 11/07/2015  . Wheezing 11/07/2015  . Edema 10/25/2015  . Conjunctivitis 07/09/2015  . UTI (urinary tract infection) 03/24/2015  . GERD (gastroesophageal reflux disease) 12/17/2014  . Hypokalemia 12/17/2014  . Depression 11/25/2014  . Iron deficiency anemia 09/03/2014  . Chronic renal disease, stage 2, mildly decreased glomerular filtration rate (GFR) between 60-89 mL/min/1.73 square meter 09/03/2014  . Pain in joint, lower  leg 08/21/2014  . Cough 01/27/2014  . Dizziness 01/12/2014  . Contusion of unspecified site 01/12/2014  . Breast mass, right 12/13/2013  . Lumbago 10/23/2013  . Type 2 diabetes, controlled, with neuropathy (HCC) 10/18/2013  . 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 Unspecified constipation 10/18/2013  . Plantar fasciitis, left 07/29/2013  . Physical deconditioning 07/21/2013  . Diabetes mellitus, controlled (HCC) 07/19/2013  . Chronic diastolic CHF (congestive heart failure) (HCC) 07/19/2013  . HCAP (healthcare-associated pneumonia) 07/18/2013  . Chronic diastolic heart failure (HCC) 06/24/2013  . Syncope 06/21/2013  . Syncope and collapse 06/21/2013  . Leukocytosis 06/21/2013  . Hypertensive heart disease with congestive heart failure (HCC) 06/21/2013  . Diabetes mellitus due to underlying condition (HCC) 06/21/2013    CMP     Component Value Date/Time   NA 144 07/27/2017   K 3.9 07/27/2017   CL 106 10/09/2016 1425   CO2 26 10/09/2016 1425   GLUCOSE 97 10/09/2016 1425   BUN 20 07/27/2017   CREATININE 1.0 07/27/2017   CREATININE 0.82 10/09/2016 1425  CALCIUM 8.8 (L) 10/09/2016 1425   PROT 6.4 (L) 10/09/2016 1425   ALBUMIN 3.4 (L) 10/09/2016 1425   AST 21 07/27/2017   ALT 13 07/27/2017   ALKPHOS 99 07/27/2017   BILITOT 0.5 10/09/2016 1425   GFRNONAA >60 10/09/2016 1425   GFRAA >60 10/09/2016 1425   Recent Labs    07/27/17  NA 144  K 3.9  BUN 20  CREATININE 1.0   Recent Labs    07/27/17  AST 21  ALT 13  ALKPHOS 99   Recent Labs    07/27/17  WBC 5.2  HGB 11.3*  HCT 33*  PLT 206   Recent Labs    07/27/17  CHOL 160  LDLCALC 86  TRIG 79   No results found for: MICROALBUR Lab Results  Component Value Date   TSH 0.36 (A) 07/27/2017   Lab Results  Component Value Date   HGBA1C 6.2 07/27/2017   Lab Results  Component Value Date   CHOL 160 07/27/2017   HDL 58 07/27/2017   LDLCALC 86 07/27/2017   TRIG 79  07/27/2017   CHOLHDL 3.2 06/22/2013    Significant Diagnostic Results in last 30 days:  No results found.  Assessment and Plan  Vitamin D deficiency Last vitamin D 12; continue 50,000 units weekly  Type 2 diabetes, controlled, with neuropathy Stable; continue Glucophage 500 mg every morning; patient is not on statin but LDL is 86 and HDL is 58; patient is on ARB  Peripheral sensory neuropathy due to type 2 diabetes mellitus Stable; continue Neurontin 300 mg nightly     Timothee Gali D. Lyn Hollingshead, MD

## 2018-03-02 DIAGNOSIS — Z9181 History of falling: Secondary | ICD-10-CM | POA: Diagnosis not present

## 2018-03-02 DIAGNOSIS — R2681 Unsteadiness on feet: Secondary | ICD-10-CM | POA: Diagnosis not present

## 2018-03-02 DIAGNOSIS — M6281 Muscle weakness (generalized): Secondary | ICD-10-CM | POA: Diagnosis not present

## 2018-03-03 ENCOUNTER — Encounter: Payer: Self-pay | Admitting: Internal Medicine

## 2018-03-03 NOTE — Assessment & Plan Note (Signed)
Stable; continue Glucophage 500 mg every morning; patient is not on statin but LDL is 86 and HDL is 58; patient is on ARB

## 2018-03-03 NOTE — Assessment & Plan Note (Signed)
Stable; continue Neurontin 300 mg nightly

## 2018-03-03 NOTE — Assessment & Plan Note (Signed)
Last vitamin D 12; continue 50,000 units weekly

## 2018-03-05 DIAGNOSIS — Z9181 History of falling: Secondary | ICD-10-CM | POA: Diagnosis not present

## 2018-03-05 DIAGNOSIS — M6281 Muscle weakness (generalized): Secondary | ICD-10-CM | POA: Diagnosis not present

## 2018-03-05 DIAGNOSIS — R2681 Unsteadiness on feet: Secondary | ICD-10-CM | POA: Diagnosis not present

## 2018-03-06 DIAGNOSIS — M6281 Muscle weakness (generalized): Secondary | ICD-10-CM | POA: Diagnosis not present

## 2018-03-06 DIAGNOSIS — Z9181 History of falling: Secondary | ICD-10-CM | POA: Diagnosis not present

## 2018-03-06 DIAGNOSIS — R2681 Unsteadiness on feet: Secondary | ICD-10-CM | POA: Diagnosis not present

## 2018-03-07 DIAGNOSIS — R2681 Unsteadiness on feet: Secondary | ICD-10-CM | POA: Diagnosis not present

## 2018-03-07 DIAGNOSIS — Z9181 History of falling: Secondary | ICD-10-CM | POA: Diagnosis not present

## 2018-03-07 DIAGNOSIS — M6281 Muscle weakness (generalized): Secondary | ICD-10-CM | POA: Diagnosis not present

## 2018-03-08 DIAGNOSIS — H11041 Peripheral pterygium, stationary, right eye: Secondary | ICD-10-CM | POA: Diagnosis not present

## 2018-03-08 DIAGNOSIS — H0101B Ulcerative blepharitis left eye, upper and lower eyelids: Secondary | ICD-10-CM | POA: Diagnosis not present

## 2018-03-08 DIAGNOSIS — E119 Type 2 diabetes mellitus without complications: Secondary | ICD-10-CM | POA: Diagnosis not present

## 2018-03-08 DIAGNOSIS — R2681 Unsteadiness on feet: Secondary | ICD-10-CM | POA: Diagnosis not present

## 2018-03-08 DIAGNOSIS — M6281 Muscle weakness (generalized): Secondary | ICD-10-CM | POA: Diagnosis not present

## 2018-03-08 DIAGNOSIS — Z9181 History of falling: Secondary | ICD-10-CM | POA: Diagnosis not present

## 2018-03-08 DIAGNOSIS — H0101A Ulcerative blepharitis right eye, upper and lower eyelids: Secondary | ICD-10-CM | POA: Diagnosis not present

## 2018-03-09 DIAGNOSIS — Z9181 History of falling: Secondary | ICD-10-CM | POA: Diagnosis not present

## 2018-03-09 DIAGNOSIS — M6281 Muscle weakness (generalized): Secondary | ICD-10-CM | POA: Diagnosis not present

## 2018-03-09 DIAGNOSIS — R2681 Unsteadiness on feet: Secondary | ICD-10-CM | POA: Diagnosis not present

## 2018-03-13 DIAGNOSIS — R2681 Unsteadiness on feet: Secondary | ICD-10-CM | POA: Diagnosis not present

## 2018-03-13 DIAGNOSIS — Z9181 History of falling: Secondary | ICD-10-CM | POA: Diagnosis not present

## 2018-03-13 DIAGNOSIS — Z23 Encounter for immunization: Secondary | ICD-10-CM | POA: Diagnosis not present

## 2018-03-13 DIAGNOSIS — M6281 Muscle weakness (generalized): Secondary | ICD-10-CM | POA: Diagnosis not present

## 2018-03-14 DIAGNOSIS — R2681 Unsteadiness on feet: Secondary | ICD-10-CM | POA: Diagnosis not present

## 2018-03-14 DIAGNOSIS — Z9181 History of falling: Secondary | ICD-10-CM | POA: Diagnosis not present

## 2018-03-14 DIAGNOSIS — M6281 Muscle weakness (generalized): Secondary | ICD-10-CM | POA: Diagnosis not present

## 2018-03-15 DIAGNOSIS — M6281 Muscle weakness (generalized): Secondary | ICD-10-CM | POA: Diagnosis not present

## 2018-03-15 DIAGNOSIS — R2681 Unsteadiness on feet: Secondary | ICD-10-CM | POA: Diagnosis not present

## 2018-03-15 DIAGNOSIS — Z9181 History of falling: Secondary | ICD-10-CM | POA: Diagnosis not present

## 2018-03-16 DIAGNOSIS — M6281 Muscle weakness (generalized): Secondary | ICD-10-CM | POA: Diagnosis not present

## 2018-03-16 DIAGNOSIS — Z9181 History of falling: Secondary | ICD-10-CM | POA: Diagnosis not present

## 2018-03-16 DIAGNOSIS — R2681 Unsteadiness on feet: Secondary | ICD-10-CM | POA: Diagnosis not present

## 2018-03-19 DIAGNOSIS — M6281 Muscle weakness (generalized): Secondary | ICD-10-CM | POA: Diagnosis not present

## 2018-03-19 DIAGNOSIS — Z9181 History of falling: Secondary | ICD-10-CM | POA: Diagnosis not present

## 2018-03-19 DIAGNOSIS — R2681 Unsteadiness on feet: Secondary | ICD-10-CM | POA: Diagnosis not present

## 2018-03-28 ENCOUNTER — Non-Acute Institutional Stay (SKILLED_NURSING_FACILITY): Payer: Medicare Other | Admitting: Internal Medicine

## 2018-03-28 ENCOUNTER — Encounter: Payer: Self-pay | Admitting: Internal Medicine

## 2018-03-28 DIAGNOSIS — I5022 Chronic systolic (congestive) heart failure: Secondary | ICD-10-CM | POA: Diagnosis not present

## 2018-03-28 DIAGNOSIS — K219 Gastro-esophageal reflux disease without esophagitis: Secondary | ICD-10-CM

## 2018-03-28 DIAGNOSIS — I5032 Chronic diastolic (congestive) heart failure: Secondary | ICD-10-CM | POA: Diagnosis not present

## 2018-03-28 DIAGNOSIS — I110 Hypertensive heart disease with heart failure: Secondary | ICD-10-CM | POA: Diagnosis not present

## 2018-03-28 NOTE — Progress Notes (Signed)
Location:  [facility] Room Number: 201D Place of Service:  SNF [phone])  Margaret [name]. Lyn Hollingshead, MD  [name], MD as PCP - General (Internal Medicine)  Extended Emergency Contact Information Primary Emergency Contact: Wimmer,[name] Address: [address]          [place], [place] [address] [name] of [place] Home Phone: [phone] Mobile Phone: [phone] Relation: Sister Secondary Emergency Contact: [name],[name] Address: [address]          [place], [place] [address] [name] of [place] Home Phone: [phone] Relation: Granddaughter    Allergies: Tequin [gatifloxacin]  Chief Complaint  Patient presents with  . Medical Management of Chronic Issues    Routine Visit    HPI: Patient is 84 y.o. female who is being seen for routine issues of GERD, hypertension, and chronic diastolic congestive heart failure.  Past Medical History:  Diagnosis Date  . Breast mass, right 12/13/2013  . Chronic diastolic CHF (congestive heart failure) (HCC) 07/19/2013  . Chronic diastolic heart failure (HCC)   . Chronic kidney disease, stage II (mild)   . Chronic renal disease, stage 2, mildly decreased glomerular filtration rate (GFR) between 60-89 mL/min/1.73 square meter 09/03/2014  . Closed T12 fracture (HCC) 07/16/2013  . Depression 07/18/2013  . Diabetes mellitus   . Diabetes mellitus due to underlying condition (HCC) 06/21/2013  . Diabetes mellitus without complication (HCC)   . Dysphagia, oropharyngeal phase   . GERD (gastroesophageal reflux disease) 12/17/2014  . Gout   . Hypertension   . Hypertensive heart disease with congestive heart failure (HCC) 06/21/2013  . Iron deficiency anemia 09/03/2014  . Iron deficiency anemia, unspecified   . Leukocytosis 07/18/2013  . Lumbago 10/23/2013  . Lung nodule < 6cm on CT 11/13/2016  . Osteopenia 07/16/2013  . Syncope 06/21/2013  . Type 2 diabetes, controlled, with  neuropathy (HCC) 10/18/2013  . Unspecified constipation 10/18/2013  . Vitamin D deficiency 04/11/2016    Past Surgical History:  Procedure Laterality Date  . NO PAST SURGERIES      Allergies as of 03/28/2018      Reactions   Tequin [gatifloxacin]       Medication List        Accurate as of 03/28/18 11:59 PM. Always use your most recent med list.          aspirin 81 MG chewable tablet Chew 1 tablet (81 mg total) by mouth daily.   ferrous sulfate 325 (65 FE) MG tablet Take 325 mg by mouth daily with breakfast.   furosemide 40 MG tablet Commonly known as:  LASIX Take 40 mg by mouth every morning.   gabapentin 300 MG capsule Commonly known as:  NEURONTIN Take 1 capsule (300 mg total) by mouth at bedtime.   ipratropium-albuterol 0.5-2.5 (3) MG/3ML Soln Commonly known as:  DUONEB Take 3 mLs by nebulization every 6 (six) hours as needed.   losartan 25 MG tablet Commonly known as:  COZAAR Take 25 mg by mouth every morning. For HTN   metFORMIN 500 MG 24 hr tablet Commonly known as:  GLUCOPHAGE-XR Take 500 mg by mouth every evening. For diabetes   NUTRITIONAL SUPPLEMENT Liqd Take by mouth. Magic cup-take one cup with dinner as supplement   omeprazole 20 MG capsule Commonly known as:  PRILOSEC Take 20 mg by mouth daily. For GERD   potassium chloride 10 MEQ tablet Commonly known as:  K-DUR Take 30 mEq by mouth daily.  Take 3 tablets potassium supplement   traMADol 50 MG tablet Commonly known as:  ULTRAM Take 50 mg by mouth 2 (two) times daily.   Vitamin D (Ergocalciferol) 50000 units Caps capsule Commonly known as:  DRISDOL Take 50,000 Units by mouth every 7 (seven) days.       No orders of the defined types were placed in this encounter.   Immunization History  Administered Date(s) Administered  . Influenza-Unspecified 03/23/2015, 04/06/2016, 04/06/2017  . PPD Test 08/12/2013  . Pneumococcal Polysaccharide-23 06/23/2013    Social History   Tobacco  Use  . Smoking status: Former Games developer  . Smokeless tobacco: Never Used  Substance Use Topics  . Alcohol use: No    Review of Systems  DATA OBTAINED: from nurse GENERAL:  no fevers, fatigue, appetite changes SKIN: No itching, rash HEENT: No complaint RESPIRATORY: No cough, wheezing, SOB CARDIAC: No chest pain, palpitations, lower extremity edema  GI: No abdominal pain, No N/V/D or constipation, No heartburn or reflux  GU: No dysuria, frequency or urgency, or incontinence  MUSCULOSKELETAL: No unrelieved bone/joint pain NEUROLOGIC: No headache, dizziness  PSYCHIATRIC: No overt anxiety or sadness  Vitals:   03/28/18 1156  BP: 106/72  Pulse: 98  Resp: 19  Temp: 97.8 F (36.6 C)   Body mass index is 20.13 kg/m. Physical Exam  GENERAL APPEARANCE: Alert, normally conversant, No acute distress  SKIN: No diaphoresis rash HEENT: Unremarkable RESPIRATORY: Breathing is even, unlabored. Lung sounds are clear   CARDIOVASCULAR: Heart RRR no murmurs, rubs or gallops. No peripheral edema  GASTROINTESTINAL: Abdomen is soft, non-tender, not distended w/ normal bowel sounds.  GENITOURINARY: Bladder non tender, not distended  MUSCULOSKELETAL: No abnormal joints or musculature NEUROLOGIC: Cranial nerves 2-12 grossly intact. Moves all extremities PSYCHIATRIC: Mood and affect appropriate to situation, no behavioral issues  Patient Active Problem List   Diagnosis Date Noted  . Lung nodule < 6cm on CT 11/13/2016  . Vitamin D deficiency 04/11/2016  . Hyperlipidemia 02/11/2016  . Pain 11/07/2015  . Wheezing 11/07/2015  . Edema 10/25/2015  . Conjunctivitis 07/09/2015  . UTI (urinary tract infection) 03/24/2015  . GERD (gastroesophageal reflux disease) 12/17/2014  . Hypokalemia 12/17/2014  . Depression 11/25/2014  . Iron deficiency anemia 09/03/2014  . Chronic renal disease, stage 2, mildly decreased glomerular filtration rate (GFR) between 60-89 mL/min/1.73 square meter 09/03/2014  . Pain  in joint, lower leg 08/21/2014  . Cough 01/27/2014  . Dizziness 01/12/2014  . Contusion of unspecified site 01/12/2014  . Breast mass, right 12/13/2013  . Lumbago 10/23/2013  . Type 2 diabetes, controlled, with neuropathy (HCC) 10/18/2013  . 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 Unspecified constipation 10/18/2013  . Plantar fasciitis, left 07/29/2013  . Physical deconditioning 07/21/2013  . Diabetes mellitus, controlled (HCC) 07/19/2013  . Chronic diastolic CHF (congestive heart failure) (HCC) 07/19/2013  . HCAP (healthcare-associated pneumonia) 07/18/2013  . Chronic diastolic heart failure (HCC) 06/24/2013  . Syncope 06/21/2013  . Syncope and collapse 06/21/2013  . Leukocytosis 06/21/2013  . Hypertensive heart disease with congestive heart failure (HCC) 06/21/2013  . Diabetes mellitus due to underlying condition (HCC) 06/21/2013    CMP     Component Value Date/Time   NA 144 07/27/2017   K 3.9 07/27/2017   CL 106 10/09/2016 1425   CO2 26 10/09/2016 1425   GLUCOSE 97 10/09/2016 1425   BUN 20 07/27/2017   CREATININE 1.0 07/27/2017   CREATININE 0.82 10/09/2016 1425   CALCIUM  8.8 (L) 10/09/2016 1425   PROT 6.4 (L) 10/09/2016 1425   ALBUMIN 3.4 (L) 10/09/2016 1425   AST 21 07/27/2017   ALT 13 07/27/2017   ALKPHOS 99 07/27/2017   BILITOT 0.5 10/09/2016 1425   GFRNONAA >60 10/09/2016 1425   GFRAA >60 10/09/2016 1425   Recent Labs    07/27/17  NA 144  K 3.9  BUN 20  CREATININE 1.0   Recent Labs    07/27/17  AST 21  ALT 13  ALKPHOS 99   Recent Labs    07/27/17  WBC 5.2  HGB 11.3*  HCT 33*  PLT 206   Recent Labs    07/27/17  CHOL 160  LDLCALC 86  TRIG 79   No results found for: MICROALBUR Lab Results  Component Value Date   TSH 0.36 (A) 07/27/2017   Lab Results  Component Value Date   HGBA1C 6.2 07/27/2017   Lab Results  Component Value Date   CHOL 160 07/27/2017   HDL 58 07/27/2017   LDLCALC 86  07/27/2017   TRIG 79 07/27/2017   CHOLHDL 3.2 06/22/2013    Significant Diagnostic Results in last 30 days:  No results found.  Assessment and Plan  GERD (gastroesophageal reflux disease) No problems reported; continue omeprazole 20 mg daily  Hypertensive heart disease with congestive heart failure Controlled; continue Lasix 40 mg daily and Cozaar 25 mg daily  Chronic diastolic heart failure No exacerbations, well controlled; continue Cozaar 25 mg daily and Lasix 40 mg daily     Mehtaab Mayeda D. Lyn Hollingshead, MD

## 2018-04-11 DIAGNOSIS — Z9181 History of falling: Secondary | ICD-10-CM | POA: Diagnosis not present

## 2018-04-25 ENCOUNTER — Encounter: Payer: Self-pay | Admitting: Internal Medicine

## 2018-04-25 NOTE — Assessment & Plan Note (Signed)
Controlled; continue Lasix 40 mg daily and Cozaar 25 mg daily

## 2018-04-25 NOTE — Assessment & Plan Note (Signed)
No exacerbations, well controlled; continue Cozaar 25 mg daily and Lasix 40 mg daily

## 2018-04-25 NOTE — Assessment & Plan Note (Signed)
No problems reported; continue omeprazole 20 mg daily

## 2018-05-01 ENCOUNTER — Non-Acute Institutional Stay (SKILLED_NURSING_FACILITY): Payer: Medicare Other | Admitting: Internal Medicine

## 2018-05-01 ENCOUNTER — Encounter: Payer: Self-pay | Admitting: Internal Medicine

## 2018-05-01 DIAGNOSIS — E785 Hyperlipidemia, unspecified: Secondary | ICD-10-CM

## 2018-05-01 DIAGNOSIS — E039 Hypothyroidism, unspecified: Secondary | ICD-10-CM | POA: Diagnosis not present

## 2018-05-01 DIAGNOSIS — E1142 Type 2 diabetes mellitus with diabetic polyneuropathy: Secondary | ICD-10-CM

## 2018-05-01 DIAGNOSIS — Z79899 Other long term (current) drug therapy: Secondary | ICD-10-CM | POA: Diagnosis not present

## 2018-05-01 DIAGNOSIS — D649 Anemia, unspecified: Secondary | ICD-10-CM | POA: Diagnosis not present

## 2018-05-01 DIAGNOSIS — I5032 Chronic diastolic (congestive) heart failure: Secondary | ICD-10-CM

## 2018-05-01 DIAGNOSIS — E119 Type 2 diabetes mellitus without complications: Secondary | ICD-10-CM | POA: Diagnosis not present

## 2018-05-01 DIAGNOSIS — E559 Vitamin D deficiency, unspecified: Secondary | ICD-10-CM | POA: Diagnosis not present

## 2018-05-01 LAB — LIPID PANEL
CHOLESTEROL: 145 (ref 0–200)
HDL: 48 (ref 35–70)
LDL Cholesterol: 78
Triglycerides: 96 (ref 40–160)

## 2018-05-01 LAB — BASIC METABOLIC PANEL
BUN: 35 — AB (ref 4–21)
CREATININE: 1.4 — AB (ref 0.5–1.1)
Glucose: 114
POTASSIUM: 4.6 (ref 3.4–5.3)
Sodium: 139 (ref 137–147)

## 2018-05-01 LAB — CBC AND DIFFERENTIAL
HCT: 31 — AB (ref 36–46)
Hemoglobin: 10.6 — AB (ref 12.0–16.0)
Platelets: 201 (ref 150–399)
WBC: 5.1

## 2018-05-01 LAB — HEPATIC FUNCTION PANEL
ALK PHOS: 81 (ref 25–125)
ALT: 16 (ref 7–35)
AST: 19 (ref 13–35)
Bilirubin, Total: 0.2

## 2018-05-01 LAB — HEMOGLOBIN A1C: HEMOGLOBIN A1C: 5.7

## 2018-05-01 LAB — VITAMIN D 25 HYDROXY (VIT D DEFICIENCY, FRACTURES): VIT D 25 HYDROXY: 11.9

## 2018-05-01 LAB — TSH: TSH: 0.3 — AB (ref 0.41–5.90)

## 2018-05-01 NOTE — Assessment & Plan Note (Signed)
Hemoglobin A1c 6.2, more recent 1 pending; continue Glucophage 500 mg daily; patient is on arm

## 2018-05-01 NOTE — Assessment & Plan Note (Signed)
LDL 86, HDL 56; reasonable control on no meds

## 2018-05-01 NOTE — Progress Notes (Signed)
Location:  [facility] Room Number: 201D Place of Service:  SNF [phone])  Randon Goldsmith. Lyn Hollingshead, MD  [name], MD as PCP - General (Internal Medicine)  Extended Emergency Contact Information Primary Emergency Contact: Granados,[name] Address: [address]          [place], [place] [address] [name] of [place] Home Phone: [phone] Mobile Phone: [phone] Relation: Sister Secondary Emergency Contact: [name],[name] Address: [address]          [place], [place] [address] [name] of [place] Home Phone: [phone] Relation: Granddaughter    Allergies: Tequin [gatifloxacin]  Chief Complaint  Patient presents with  . Medical Management of Chronic Issues    Routine Visit    HPI: Patient is 84 y.o. female who is being seen for routine issues of diabetes mellitus 2 congestive heart failure, and hyperlipidemia.  Past Medical History:  Diagnosis Date  . Breast mass, right 12/13/2013  . Chronic diastolic CHF (congestive heart failure) (HCC) 07/19/2013  . Chronic diastolic heart failure (HCC)   . Chronic kidney disease, stage II (mild)   . Chronic renal disease, stage 2, mildly decreased glomerular filtration rate (GFR) between 60-89 mL/min/1.73 square meter 09/03/2014  . Closed T12 fracture (HCC) 07/16/2013  . Depression 07/18/2013  . Diabetes mellitus   . Diabetes mellitus due to underlying condition (HCC) 06/21/2013  . Diabetes mellitus without complication (HCC)   . Dysphagia, oropharyngeal phase   . GERD (gastroesophageal reflux disease) 12/17/2014  . Gout   . Hypertension   . Hypertensive heart disease with congestive heart failure (HCC) 06/21/2013  . Iron deficiency anemia 09/03/2014  . Iron deficiency anemia, unspecified   . Leukocytosis 07/18/2013  . Lumbago 10/23/2013  . Lung nodule < 6cm on CT 11/13/2016  . Osteopenia 07/16/2013  . Syncope 06/21/2013  . Type 2 diabetes, controlled, with neuropathy  (HCC) 10/18/2013  . Unspecified constipation 10/18/2013  . Vitamin D deficiency 04/11/2016    Past Surgical History:  Procedure Laterality Date  . NO PAST SURGERIES      Allergies as of 05/01/2018      Reactions   Tequin [gatifloxacin]       Medication List        Accurate as of 05/01/18  7:48 PM. Always use your most recent med list.          aspirin 81 MG chewable tablet Chew 1 tablet (81 mg total) by mouth daily.   ferrous sulfate 325 (65 FE) MG tablet Take 325 mg by mouth daily with breakfast.   furosemide 40 MG tablet Commonly known as:  LASIX Take 40 mg by mouth every morning.   gabapentin 300 MG capsule Commonly known as:  NEURONTIN Take 1 capsule (300 mg total) by mouth at bedtime.   ipratropium-albuterol 0.5-2.5 (3) MG/3ML Soln Commonly known as:  DUONEB Take 3 mLs by nebulization every 6 (six) hours as needed.   losartan 25 MG tablet Commonly known as:  COZAAR Take 25 mg by mouth every morning. For HTN   metFORMIN 500 MG 24 hr tablet Commonly known as:  GLUCOPHAGE-XR Take 500 mg by mouth every evening. For diabetes   NUTRITIONAL SUPPLEMENT Liqd Take by mouth. Magic cup-take one cup with dinner as supplement   omeprazole 20 MG capsule Commonly known as:  PRILOSEC Take 20 mg by mouth daily. For GERD   potassium chloride 10 MEQ tablet Commonly known as:  K-DUR Take 30 mEq by mouth  daily. Take 3 tablets potassium supplement   traMADol 50 MG tablet Commonly known as:  ULTRAM Take 50 mg by mouth 2 (two) times daily.   Vitamin D (Ergocalciferol) 50000 units Caps capsule Commonly known as:  DRISDOL Take 50,000 Units by mouth every 7 (seven) days.       No orders of the defined types were placed in this encounter.   Immunization History  Administered Date(s) Administered  . Influenza-Unspecified 03/23/2015, 04/06/2016, 04/06/2017  . PPD Test 08/12/2013  . Pneumococcal Polysaccharide-23 06/23/2013    Social History   Tobacco Use  .  Smoking status: Former Games developer  . Smokeless tobacco: Never Used  Substance Use Topics  . Alcohol use: No    Review of Systems  DATA OBTAINED: from nurse GENERAL:  no fevers, fatigue, appetite changes SKIN: No itching, rash HEENT: No complaint RESPIRATORY: No cough, wheezing, SOB CARDIAC: No chest pain, palpitations, lower extremity edema  GI: No abdominal pain, No N/V/D or constipation, No heartburn or reflux  GU: No dysuria, frequency or urgency, or incontinence  MUSCULOSKELETAL: No unrelieved bone/joint pain NEUROLOGIC: No headache, dizziness  PSYCHIATRIC: No overt anxiety or sadness  Vitals:   05/01/18 1040  BP: 133/70  Pulse: 76  Resp: 18  Temp: (!) 97.3 F (36.3 C)   Body mass index is 20.13 kg/m. Physical Exam  GENERAL APPEARANCE: Alert, minimally conversant, No acute distress  SKIN: No diaphoresis rash HEENT: Unremarkable RESPIRATORY: Breathing is even, unlabored. Lung sounds are clear   CARDIOVASCULAR: Heart RRR no murmurs, rubs or gallops. No peripheral edema  GASTROINTESTINAL: Abdomen is soft, non-tender, not distended w/ normal bowel sounds.  GENITOURINARY: Bladder non tender, not distended  MUSCULOSKELETAL: No abnormal joints or musculature NEUROLOGIC: Cranial nerves 2-12 grossly intact. Moves all extremities PSYCHIATRIC: Mood and affect appropriate to situation, no behavioral issues  Patient Active Problem List   Diagnosis Date Noted  . Lung nodule < 6cm on CT 11/13/2016  . Vitamin D deficiency 04/11/2016  . Hyperlipidemia 02/11/2016  . Pain 11/07/2015  . Wheezing 11/07/2015  . Edema 10/25/2015  . Conjunctivitis 07/09/2015  . UTI (urinary tract infection) 03/24/2015  . GERD (gastroesophageal reflux disease) 12/17/2014  . Hypokalemia 12/17/2014  . Depression 11/25/2014  . Iron deficiency anemia 09/03/2014  . Chronic renal disease, stage 2, mildly decreased glomerular filtration rate (GFR) between 60-89 mL/min/1.73 square meter 09/03/2014  . Pain in  joint, lower leg 08/21/2014  . Cough 01/27/2014  . Dizziness 01/12/2014  . Contusion of unspecified site 01/12/2014  . Breast mass, right 12/13/2013  . Lumbago 10/23/2013  . Type 2 diabetes, controlled, with neuropathy (HCC) 10/18/2013  . 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 Unspecified constipation 10/18/2013  . Plantar fasciitis, left 07/29/2013  . Physical deconditioning 07/21/2013  . Diabetes mellitus, controlled (HCC) 07/19/2013  . Chronic diastolic CHF (congestive heart failure) (HCC) 07/19/2013  . HCAP (healthcare-associated pneumonia) 07/18/2013  . Chronic diastolic heart failure (HCC) 06/24/2013  . Syncope 06/21/2013  . Syncope and collapse 06/21/2013  . Leukocytosis 06/21/2013  . Hypertensive heart disease with congestive heart failure (HCC) 06/21/2013  . Diabetes mellitus due to underlying condition (HCC) 06/21/2013    CMP     Component Value Date/Time   NA 144 07/27/2017   K 3.9 07/27/2017   CL 106 10/09/2016 1425   CO2 26 10/09/2016 1425   GLUCOSE 97 10/09/2016 1425   BUN 20 07/27/2017   CREATININE 1.0 07/27/2017   CREATININE 0.82 10/09/2016 1425  CALCIUM 8.8 (L) 10/09/2016 1425   PROT 6.4 (L) 10/09/2016 1425   ALBUMIN 3.4 (L) 10/09/2016 1425   AST 21 07/27/2017   ALT 13 07/27/2017   ALKPHOS 99 07/27/2017   BILITOT 0.5 10/09/2016 1425   GFRNONAA >60 10/09/2016 1425   GFRAA >60 10/09/2016 1425   Recent Labs    07/27/17  NA 144  K 3.9  BUN 20  CREATININE 1.0   Recent Labs    07/27/17  AST 21  ALT 13  ALKPHOS 99   Recent Labs    07/27/17  WBC 5.2  HGB 11.3*  HCT 33*  PLT 206   Recent Labs    07/27/17  CHOL 160  LDLCALC 86  TRIG 79   No results found for: MICROALBUR Lab Results  Component Value Date   TSH 0.36 (A) 07/27/2017   Lab Results  Component Value Date   HGBA1C 6.2 07/27/2017   Lab Results  Component Value Date   CHOL 160 07/27/2017   HDL 58 07/27/2017   LDLCALC 86  07/27/2017   TRIG 79 07/27/2017   CHOLHDL 3.2 06/22/2013    Significant Diagnostic Results in last 30 days:  No results found.  Assessment and Plan  Diabetes mellitus, controlled Hemoglobin A1c 6.2, more recent 1 pending; continue Glucophage 500 mg daily; patient is on arm  Chronic diastolic CHF (congestive heart failure) No reported exacerbation; continue Lasix 40 mg daily and Cozaar 25 mg daily  Hyperlipidemia LDL 86, HDL 56; reasonable control on no meds    Anne D. Lyn Hollingshead, MD

## 2018-05-01 NOTE — Assessment & Plan Note (Signed)
No reported exacerbation; continue Lasix 40 mg daily and Cozaar 25 mg daily

## 2018-05-14 DIAGNOSIS — D649 Anemia, unspecified: Secondary | ICD-10-CM | POA: Diagnosis not present

## 2018-05-14 DIAGNOSIS — I10 Essential (primary) hypertension: Secondary | ICD-10-CM | POA: Diagnosis not present

## 2018-05-14 LAB — BASIC METABOLIC PANEL
BUN: 22 — AB (ref 4–21)
Creatinine: 1.1 (ref 0.5–1.1)
Glucose: 91
Potassium: 4.6 (ref 3.4–5.3)
SODIUM: 141 (ref 137–147)

## 2018-05-30 ENCOUNTER — Encounter: Payer: Self-pay | Admitting: Internal Medicine

## 2018-05-30 ENCOUNTER — Non-Acute Institutional Stay (SKILLED_NURSING_FACILITY): Payer: Medicare Other | Admitting: Internal Medicine

## 2018-05-30 DIAGNOSIS — E785 Hyperlipidemia, unspecified: Secondary | ICD-10-CM | POA: Diagnosis not present

## 2018-05-30 DIAGNOSIS — E1142 Type 2 diabetes mellitus with diabetic polyneuropathy: Secondary | ICD-10-CM

## 2018-05-30 DIAGNOSIS — E1169 Type 2 diabetes mellitus with other specified complication: Secondary | ICD-10-CM

## 2018-05-30 NOTE — Progress Notes (Signed)
Location:  [facility] Room Number: 201D Place of Service:  SNF [phone])  Randon Goldsmith. Lyn Hollingshead, MD  [name], MD as PCP - General (Internal Medicine)  Extended Emergency Contact Information Primary Emergency Contact: Mandella,[name] Address: [address]          [place], [place] [address] [name] of [place] Home Phone: [phone] Mobile Phone: [phone] Relation: Sister Secondary Emergency Contact: [name],[name] Address: [address]          [place], [place] [address] [name] of [place] Home Phone: [phone] Relation: Granddaughter    Allergies: Tequin [gatifloxacin]  Chief Complaint  Patient presents with  . Medical Management of Chronic Issues    Routine Visit    HPI: Patient is 84 y.o. female who is being seen for routine issues of diabetes mellitus type 2, hyperlipidemia, and polyneuropathy.  Past Medical History:  Diagnosis Date  . Breast mass, right 12/13/2013  . Chronic diastolic CHF (congestive heart failure) (HCC) 07/19/2013  . Chronic diastolic heart failure (HCC)   . Chronic kidney disease, stage II (mild)   . Chronic renal disease, stage 2, mildly decreased glomerular filtration rate (GFR) between 60-89 mL/min/1.73 square meter 09/03/2014  . Closed T12 fracture (HCC) 07/16/2013  . Depression 07/18/2013  . Diabetes mellitus   . Diabetes mellitus due to underlying condition (HCC) 06/21/2013  . Diabetes mellitus without complication (HCC)   . Dysphagia, oropharyngeal phase   . GERD (gastroesophageal reflux disease) 12/17/2014  . Gout   . Hypertension   . Hypertensive heart disease with congestive heart failure (HCC) 06/21/2013  . Iron deficiency anemia 09/03/2014  . Iron deficiency anemia, unspecified   . Leukocytosis 07/18/2013  . Lumbago 10/23/2013  . Lung nodule < 6cm on CT 11/13/2016  . Osteopenia 07/16/2013  . Syncope 06/21/2013  . Type 2 diabetes, controlled, with neuropathy  (HCC) 10/18/2013  . Unspecified constipation 10/18/2013  . Vitamin D deficiency 04/11/2016    Past Surgical History:  Procedure Laterality Date  . NO PAST SURGERIES      Allergies as of 05/30/2018      Reactions   Tequin [gatifloxacin]       Medication List       Accurate as of May 30, 2018 11:59 PM. Always use your most recent med list.        aspirin 81 MG chewable tablet Chew 1 tablet (81 mg total) by mouth daily.   ferrous sulfate 325 (65 FE) MG tablet Take 325 mg by mouth daily with breakfast.   furosemide 40 MG tablet Commonly known as:  LASIX Take 40 mg by mouth every morning.   gabapentin 300 MG capsule Commonly known as:  NEURONTIN Take 1 capsule (300 mg total) by mouth at bedtime.   ipratropium-albuterol 0.5-2.5 (3) MG/3ML Soln Commonly known as:  DUONEB Take 3 mLs by nebulization every 6 (six) hours as needed.   metFORMIN 500 MG 24 hr tablet Commonly known as:  GLUCOPHAGE-XR Take 500 mg by mouth every evening. For diabetes   NUTRITIONAL SUPPLEMENT Liqd Take by mouth. Magic cup-take one cup with dinner as supplement   omeprazole 20 MG capsule Commonly known as:  PRILOSEC Take 20 mg by mouth daily. For GERD   potassium chloride 10 MEQ tablet Commonly known as:  K-DUR Take 30 mEq by mouth daily. Take 3 tablets potassium supplement   traMADol 50 MG tablet Commonly known as:  ULTRAM Take 50 mg by mouth 2 (  two) times daily.   Vitamin D (Ergocalciferol) 1.25 MG (50000 UT) Caps capsule Commonly known as:  DRISDOL Take 50,000 Units by mouth every 7 (seven) days.       No orders of the defined types were placed in this encounter.   Immunization History  Administered Date(s) Administered  . Influenza-Unspecified 03/23/2015, 04/06/2016, 04/06/2017  . PPD Test 08/12/2013  . Pneumococcal Polysaccharide-23 06/23/2013    Social History   Tobacco Use  . Smoking status: Former Games developer  . Smokeless tobacco: Never Used  Substance Use Topics  .  Alcohol use: No    Review of Systems  DATA OBTAINED: from patient GENERAL:  no fevers, fatigue, appetite changes SKIN: No itching, rash HEENT: No complaint RESPIRATORY: No cough, wheezing, SOB CARDIAC: No chest pain, palpitations, lower extremity edema  GI: No abdominal pain, No N/V/D or constipation, No heartburn or reflux  GU: No dysuria, frequency or urgency, or incontinence  MUSCULOSKELETAL: No unrelieved bone/joint pain NEUROLOGIC: No headache, dizziness  PSYCHIATRIC: No overt anxiety or sadness  Vitals:   05/30/18 1411  BP: 120/65  Pulse: 77  Resp: 18  Temp: (!) 97.1 F (36.2 C)   Body mass index is 19.61 kg/m. Physical Exam  GENERAL APPEARANCE: Alert, Margaret [name] conversant, No acute distress: In hallway daily in her wheelchair SKIN: No diaphoresis rash HEENT: Unremarkable RESPIRATORY: Breathing is even, unlabored. Lung sounds are clear   CARDIOVASCULAR: Heart RRR no murmurs, rubs or gallops. No peripheral edema  GASTROINTESTINAL: Abdomen is soft, non-tender, not distended w/ normal bowel sounds.  GENITOURINARY: Bladder non tender, not distended  MUSCULOSKELETAL: No abnormal joints or musculature NEUROLOGIC: Cranial nerves 2-12 grossly intact. Moves all extremities PSYCHIATRIC: Mood and affect appropriate to situation, no behavioral issues  Patient Active Problem List   Diagnosis Date Noted  . Hyperlipidemia associated with type 2 diabetes mellitus (HCC) 06/10/2018  . Lung nodule < 6cm on CT 11/13/2016  . Vitamin D deficiency 04/11/2016  . Hyperlipidemia 02/11/2016  . Pain 11/07/2015  . Wheezing 11/07/2015  . Edema 10/25/2015  . Conjunctivitis 07/09/2015  . UTI (urinary tract infection) 03/24/2015  . GERD (gastroesophageal reflux disease) 12/17/2014  . Hypokalemia 12/17/2014  . Depression 11/25/2014  . Iron deficiency anemia 09/03/2014  . Chronic renal disease, stage 2, mildly decreased glomerular filtration rate (GFR) between 60-89 mL/min/1.73 square meter  09/03/2014  . Pain in joint, lower leg 08/21/2014  . Cough 01/27/2014  . Dizziness 01/12/2014  . Contusion of unspecified site 01/12/2014  . Breast mass, right 12/13/2013  . Lumbago 10/23/2013  . Type 2 diabetes, controlled, with neuropathy (HCC) 10/18/2013  . 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 Unspecified constipation 10/18/2013  . Plantar fasciitis, left 07/29/2013  . Physical deconditioning 07/21/2013  . Diabetes mellitus, controlled (HCC) 07/19/2013  . Chronic diastolic CHF (congestive heart failure) (HCC) 07/19/2013  . HCAP (healthcare-associated pneumonia) 07/18/2013  . Chronic diastolic heart failure (HCC) 06/24/2013  . Syncope 06/21/2013  . Syncope and collapse 06/21/2013  . Leukocytosis 06/21/2013  . Hypertensive heart disease with congestive heart failure (HCC) 06/21/2013  . Diabetes mellitus due to underlying condition (HCC) 06/21/2013    CMP     Component Value Date/Time   NA 141 05/14/2018   K 4.6 05/14/2018   CL 106 10/09/2016 1425   CO2 26 10/09/2016 1425   GLUCOSE 97 10/09/2016 1425   BUN 22 (A) 05/14/2018   CREATININE 1.1 05/14/2018   CREATININE 0.82 10/09/2016 1425   CALCIUM 8.8 (L)  10/09/2016 1425   PROT 6.4 (L) 10/09/2016 1425   ALBUMIN 3.4 (L) 10/09/2016 1425   AST 19 05/01/2018   ALT 16 05/01/2018   ALKPHOS 81 05/01/2018   BILITOT 0.5 10/09/2016 1425   GFRNONAA >60 10/09/2016 1425   GFRAA >60 10/09/2016 1425   Recent Labs    07/27/17 05/01/18 05/14/18  NA 144 139 141  K 3.9 4.6 4.6  BUN 20 35* 22*  CREATININE 1.0 1.4* 1.1   Recent Labs    07/27/17 05/01/18  AST 21 19  ALT 13 16  ALKPHOS 99 81   Recent Labs    07/27/17 05/01/18  WBC 5.2 5.1  HGB 11.3* 10.6*  HCT 33* 31*  PLT 206 201   Recent Labs    07/27/17 05/01/18  CHOL 160 145  LDLCALC 86 78  TRIG 79 96   No results found for: MICROALBUR Lab Results  Component Value Date   TSH 0.30 (A) 05/01/2018   Lab Results  Component  Value Date   HGBA1C 5.7 05/01/2018   Lab Results  Component Value Date   CHOL 145 05/01/2018   HDL 48 05/01/2018   LDLCALC 78 05/01/2018   TRIG 96 05/01/2018   CHOLHDL 3.2 06/22/2013    Significant Diagnostic Results in last 30 days:  No results found.  Assessment and Plan  Diabetes mellitus, controlled Recent A1c 5.7; controlled on Glucophage Exar 500 mg daily; patient is on ARB; patient is not on statin but lipids are well controlled  Hyperlipidemia associated with type 2 diabetes mellitus (HCC) LDL 78, HDL 48 on no medications; acceptable will monitor intervals  Peripheral sensory neuropathy due to type 2 diabetes mellitus No reports of pain; Neurontin 300 mg nightly     Hazelgrace Bonham D. Lyn HollingsheadAlexander, MD

## 2018-06-10 ENCOUNTER — Encounter: Payer: Self-pay | Admitting: Internal Medicine

## 2018-06-10 DIAGNOSIS — E785 Hyperlipidemia, unspecified: Principal | ICD-10-CM

## 2018-06-10 DIAGNOSIS — E1169 Type 2 diabetes mellitus with other specified complication: Secondary | ICD-10-CM | POA: Insufficient documentation

## 2018-06-10 NOTE — Assessment & Plan Note (Signed)
LDL 78, HDL 48 on no medications; acceptable will monitor intervals

## 2018-06-10 NOTE — Assessment & Plan Note (Signed)
Recent A1c 5.7; controlled on Glucophage Exar 500 mg daily; patient is on ARB; patient is not on statin but lipids are well controlled

## 2018-06-10 NOTE — Assessment & Plan Note (Signed)
No reports of pain; Neurontin 300 mg nightly

## 2018-06-25 DIAGNOSIS — B351 Tinea unguium: Secondary | ICD-10-CM | POA: Diagnosis not present

## 2018-06-25 DIAGNOSIS — E1151 Type 2 diabetes mellitus with diabetic peripheral angiopathy without gangrene: Secondary | ICD-10-CM | POA: Diagnosis not present

## 2018-06-25 LAB — HM DIABETES FOOT EXAM

## 2018-07-02 ENCOUNTER — Encounter: Payer: Self-pay | Admitting: Internal Medicine

## 2018-07-02 ENCOUNTER — Non-Acute Institutional Stay (SKILLED_NURSING_FACILITY): Payer: Medicare Other | Admitting: Internal Medicine

## 2018-07-02 DIAGNOSIS — I5022 Chronic systolic (congestive) heart failure: Secondary | ICD-10-CM

## 2018-07-02 DIAGNOSIS — E1140 Type 2 diabetes mellitus with diabetic neuropathy, unspecified: Secondary | ICD-10-CM | POA: Diagnosis not present

## 2018-07-02 DIAGNOSIS — K219 Gastro-esophageal reflux disease without esophagitis: Secondary | ICD-10-CM

## 2018-07-02 DIAGNOSIS — I110 Hypertensive heart disease with heart failure: Secondary | ICD-10-CM

## 2018-07-02 NOTE — Progress Notes (Signed)
Location:  [facility] Room Number: 201D Place of Service:  SNF [phone])  Randon Goldsmith. Lyn Hollingshead, MD  [name], MD as PCP - General (Internal Medicine)  Extended Emergency Contact Information Primary Emergency Contact: Difrancesco,[name] Address: [address]          [place], [place] [address] [name] of [place] Home Phone: [phone] Mobile Phone: [phone] Relation: Sister Secondary Emergency Contact: [name],[name] Address: [address]          [place], [place] [address] [name] of [place] Home Phone: [phone] Relation: Granddaughter    Allergies: Tequin [gatifloxacin]  Chief Complaint  Patient presents with  . Medical Management of Chronic Issues    Routine Visit    HPI: Patient is 85 y.o. female who is being seen for routine issues of hypertension, GERD, and diabetes mellitus type 2.  Past Medical History:  Diagnosis Date  . Breast mass, right 12/13/2013  . Chronic diastolic CHF (congestive heart failure) (HCC) 07/19/2013  . Chronic diastolic heart failure (HCC)   . Chronic kidney disease, stage II (mild)   . Chronic renal disease, stage 2, mildly decreased glomerular filtration rate (GFR) between 60-89 mL/min/1.73 square meter 09/03/2014  . Closed T12 fracture (HCC) 07/16/2013  . Depression 07/18/2013  . Diabetes mellitus   . Diabetes mellitus due to underlying condition (HCC) 06/21/2013  . Diabetes mellitus without complication (HCC)   . Dysphagia, oropharyngeal phase   . GERD (gastroesophageal reflux disease) 12/17/2014  . Gout   . Hypertension   . Hypertensive heart disease with congestive heart failure (HCC) 06/21/2013  . Iron deficiency anemia 09/03/2014  . Iron deficiency anemia, unspecified   . Leukocytosis 07/18/2013  . Lumbago 10/23/2013  . Lung nodule < 6cm on CT 11/13/2016  . Osteopenia 07/16/2013  . Syncope 06/21/2013  . Type 2 diabetes, controlled, with neuropathy (HCC) 10/18/2013    . Unspecified constipation 10/18/2013  . Vitamin D deficiency 04/11/2016    Past Surgical History:  Procedure Laterality Date  . NO PAST SURGERIES      Allergies as of 07/02/2018      Reactions   Tequin [gatifloxacin]       Medication List       Accurate as of July 02, 2018 11:59 PM. Always use your most recent med list.        aspirin 81 MG chewable tablet Chew 1 tablet (81 mg total) by mouth daily.   ferrous sulfate 325 (65 FE) MG tablet Take 325 mg by mouth daily with breakfast.   furosemide 40 MG tablet Commonly known as:  LASIX Take 40 mg by mouth every morning.   gabapentin 300 MG capsule Commonly known as:  NEURONTIN Take 1 capsule (300 mg total) by mouth at bedtime.   ipratropium-albuterol 0.5-2.5 (3) MG/3ML Soln Commonly known as:  DUONEB Take 3 mLs by nebulization every 6 (six) hours as needed.   metFORMIN 500 MG 24 hr tablet Commonly known as:  GLUCOPHAGE-XR Take 500 mg by mouth every evening. For diabetes   NUTRITIONAL SUPPLEMENT Liqd Take by mouth. Magic cup-take one cup with dinner as supplement   omeprazole 20 MG capsule Commonly known as:  PRILOSEC Take 20 mg by mouth daily. For GERD   potassium chloride 10 MEQ tablet Commonly known as:  K-DUR Take 30 mEq by mouth daily. Take 3 tablets potassium supplement   traMADol 50 MG tablet Commonly known as:  ULTRAM Take 50 mg by mouth  2 (two) times daily.   Vitamin D (Ergocalciferol) 1.25 MG (50000 UT) Caps capsule Commonly known as:  DRISDOL Take 50,000 Units by mouth every 7 (seven) days.       No orders of the defined types were placed in this encounter.   Immunization History  Administered Date(s) Administered  . Influenza-Unspecified 03/23/2015, 04/06/2016, 04/06/2017  . PPD Test 08/12/2013  . Pneumococcal Polysaccharide-23 06/23/2013    Social History   Tobacco Use  . Smoking status: Former Games developermoker  . Smokeless tobacco: Never Used  Substance Use Topics  . Alcohol use: No     Review of Systems  DATA OBTAINED: from patient nurse GENERAL:  no fevers, fatigue, appetite changes SKIN: No itching, rash HEENT: No complaint RESPIRATORY: No cough, wheezing, SOB CARDIAC: No chest pain, palpitations, lower extremity edema  GI: No abdominal pain, No N/V/D or constipation, No heartburn or reflux  GU: No dysuria, frequency or urgency, or incontinence  MUSCULOSKELETAL: No unrelieved bone/joint pain NEUROLOGIC: No headache, dizziness  PSYCHIATRIC: No overt anxiety or sadness  Vitals:   07/02/18 1242  BP: 135/60  Pulse: 90  Resp: 18  Temp: 98.2 F (36.8 C)   Body mass index is 19.48 kg/m. Physical Exam  GENERAL APPEARANCE: Alert, minimally conversant, No acute distress smiles at me SKIN: No diaphoresis rash HEENT: Unremarkable RESPIRATORY: Breathing is even, unlabored. Lung sounds are clear   CARDIOVASCULAR: Heart RRR no murmurs, rubs or gallops. No peripheral edema  GASTROINTESTINAL: Abdomen is soft, non-tender, not distended w/ normal bowel sounds.  GENITOURINARY: Bladder non tender, not distended  MUSCULOSKELETAL: No abnormal joints or musculature NEUROLOGIC: Cranial nerves 2-12 grossly intact. Moves all extremities PSYCHIATRIC: Mood and affect appropriate to situation, no behavioral issues  Patient Active Problem List   Diagnosis Date Noted  . Hyperlipidemia associated with type 2 diabetes mellitus (HCC) 06/10/2018  . Lung nodule < 6cm on CT 11/13/2016  . Vitamin D deficiency 04/11/2016  . Hyperlipidemia 02/11/2016  . Pain 11/07/2015  . Wheezing 11/07/2015  . Edema 10/25/2015  . Conjunctivitis 07/09/2015  . UTI (urinary tract infection) 03/24/2015  . GERD (gastroesophageal reflux disease) 12/17/2014  . Hypokalemia 12/17/2014  . Depression 11/25/2014  . Iron deficiency anemia 09/03/2014  . Chronic renal disease, stage 2, mildly decreased glomerular filtration rate (GFR) between 60-89 mL/min/1.73 square meter 09/03/2014  . Pain in joint, lower  leg 08/21/2014  . Cough 01/27/2014  . Dizziness 01/12/2014  . Contusion of unspecified site 01/12/2014  . Breast mass, right 12/13/2013  . Lumbago 10/23/2013  . Type 2 diabetes, controlled, with neuropathy (HCC) 10/18/2013  . 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 Unspecified constipation 10/18/2013  . Plantar fasciitis, left 07/29/2013  . Physical deconditioning 07/21/2013  . Diabetes mellitus, controlled (HCC) 07/19/2013  . Chronic diastolic CHF (congestive heart failure) (HCC) 07/19/2013  . HCAP (healthcare-associated pneumonia) 07/18/2013  . Chronic diastolic heart failure (HCC) 06/24/2013  . Syncope 06/21/2013  . Syncope and collapse 06/21/2013  . Leukocytosis 06/21/2013  . Hypertensive heart disease with congestive heart failure (HCC) 06/21/2013  . Diabetes mellitus due to underlying condition (HCC) 06/21/2013    CMP     Component Value Date/Time   NA 141 05/14/2018   K 4.6 05/14/2018   CL 106 10/09/2016 1425   CO2 26 10/09/2016 1425   GLUCOSE 97 10/09/2016 1425   BUN 22 (A) 05/14/2018   CREATININE 1.1 05/14/2018   CREATININE 0.82 10/09/2016 1425   CALCIUM 8.8 (L) 10/09/2016 1425  PROT 6.4 (L) 10/09/2016 1425   ALBUMIN 3.4 (L) 10/09/2016 1425   AST 19 05/01/2018   ALT 16 05/01/2018   ALKPHOS 81 05/01/2018   BILITOT 0.5 10/09/2016 1425   GFRNONAA >60 10/09/2016 1425   GFRAA >60 10/09/2016 1425   Recent Labs    07/27/17 05/01/18 05/14/18  NA 144 139 141  K 3.9 4.6 4.6  BUN 20 35* 22*  CREATININE 1.0 1.4* 1.1   Recent Labs    07/27/17 05/01/18  AST 21 19  ALT 13 16  ALKPHOS 99 81   Recent Labs    07/27/17 05/01/18  WBC 5.2 5.1  HGB 11.3* 10.6*  HCT 33* 31*  PLT 206 201   Recent Labs    07/27/17 05/01/18  CHOL 160 145  LDLCALC 86 78  TRIG 79 96   No results found for: MICROALBUR Lab Results  Component Value Date   TSH 0.30 (A) 05/01/2018   Lab Results  Component Value Date   HGBA1C 5.7 05/01/2018    Lab Results  Component Value Date   CHOL 145 05/01/2018   HDL 48 05/01/2018   LDLCALC 78 05/01/2018   TRIG 96 05/01/2018   CHOLHDL 3.2 06/22/2013    Significant Diagnostic Results in last 30 days:  No results found.  Assessment and Plan  Hypertensive heart disease with congestive heart failure Controlled; continue Lasix 40 mg daily  GERD (gastroesophageal reflux disease) No reports of reflux or aspiration; continue omeprazole 20 mg daily  Type 2 diabetes, controlled, with neuropathy A1c 5.7; continue Glucophage 500 mg every morning.  Patient is not on statin but LDL is 86 and HDL is 58; patient is not on ARB or ACE     Randon Goldsmith. Lyn Hollingshead, MD

## 2018-07-03 ENCOUNTER — Encounter: Payer: Self-pay | Admitting: Internal Medicine

## 2018-07-03 NOTE — Assessment & Plan Note (Signed)
A1c 5.7; continue Glucophage 500 mg every morning.  Patient is not on statin but LDL is 86 and HDL is 58; patient is not on ARB or ACE

## 2018-07-03 NOTE — Assessment & Plan Note (Signed)
No reports of reflux or aspiration; continue omeprazole 20 mg daily

## 2018-07-03 NOTE — Assessment & Plan Note (Signed)
Controlled; continue Lasix 40 mg daily 

## 2018-07-09 ENCOUNTER — Encounter (HOSPITAL_COMMUNITY): Payer: Self-pay | Admitting: Emergency Medicine

## 2018-07-09 ENCOUNTER — Non-Acute Institutional Stay (SKILLED_NURSING_FACILITY): Payer: Medicare Other | Admitting: Internal Medicine

## 2018-07-09 ENCOUNTER — Emergency Department (HOSPITAL_COMMUNITY)
Admission: EM | Admit: 2018-07-09 | Discharge: 2018-07-10 | Disposition: A | Discharge status: Home / Self Care | Payer: Medicare Other | Attending: Emergency Medicine | Admitting: Emergency Medicine

## 2018-07-09 DIAGNOSIS — N182 Chronic kidney disease, stage 2 (mild): Secondary | ICD-10-CM | POA: Diagnosis not present

## 2018-07-09 DIAGNOSIS — R45851 Suicidal ideations: Secondary | ICD-10-CM | POA: Insufficient documentation

## 2018-07-09 DIAGNOSIS — F32A Depression, unspecified: Secondary | ICD-10-CM

## 2018-07-09 DIAGNOSIS — Z7982 Long term (current) use of aspirin: Secondary | ICD-10-CM | POA: Insufficient documentation

## 2018-07-09 DIAGNOSIS — F322 Major depressive disorder, single episode, severe without psychotic features: Secondary | ICD-10-CM | POA: Diagnosis not present

## 2018-07-09 DIAGNOSIS — I130 Hypertensive heart and chronic kidney disease with heart failure and stage 1 through stage 4 chronic kidney disease, or unspecified chronic kidney disease: Secondary | ICD-10-CM | POA: Insufficient documentation

## 2018-07-09 DIAGNOSIS — F329 Major depressive disorder, single episode, unspecified: Secondary | ICD-10-CM | POA: Insufficient documentation

## 2018-07-09 DIAGNOSIS — I5032 Chronic diastolic (congestive) heart failure: Secondary | ICD-10-CM | POA: Insufficient documentation

## 2018-07-09 DIAGNOSIS — F99 Mental disorder, not otherwise specified: Secondary | ICD-10-CM | POA: Diagnosis present

## 2018-07-09 DIAGNOSIS — Z7984 Long term (current) use of oral hypoglycemic drugs: Secondary | ICD-10-CM | POA: Diagnosis not present

## 2018-07-09 DIAGNOSIS — Z87891 Personal history of nicotine dependence: Secondary | ICD-10-CM | POA: Diagnosis not present

## 2018-07-09 DIAGNOSIS — T1491XA Suicide attempt, initial encounter: Secondary | ICD-10-CM | POA: Diagnosis not present

## 2018-07-09 DIAGNOSIS — E1122 Type 2 diabetes mellitus with diabetic chronic kidney disease: Secondary | ICD-10-CM | POA: Insufficient documentation

## 2018-07-09 DIAGNOSIS — Z79899 Other long term (current) drug therapy: Secondary | ICD-10-CM | POA: Insufficient documentation

## 2018-07-09 DIAGNOSIS — R5381 Other malaise: Secondary | ICD-10-CM | POA: Diagnosis not present

## 2018-07-09 LAB — COMPREHENSIVE METABOLIC PANEL
ALT: 11 U/L (ref 0–44)
AST: 18 U/L (ref 15–41)
Albumin: 3.5 g/dL (ref 3.5–5.0)
Alkaline Phosphatase: 63 U/L (ref 38–126)
Anion gap: 11 (ref 5–15)
BUN: 30 mg/dL — ABNORMAL HIGH (ref 8–23)
CHLORIDE: 102 mmol/L (ref 98–111)
CO2: 29 mmol/L (ref 22–32)
CREATININE: 1.47 mg/dL — AB (ref 0.44–1.00)
Calcium: 8.8 mg/dL — ABNORMAL LOW (ref 8.9–10.3)
GFR calc Af Amer: 38 mL/min — ABNORMAL LOW (ref >60)
GFR calc non Af Amer: 32 mL/min — ABNORMAL LOW (ref >60)
Glucose, Bld: 120 mg/dL — ABNORMAL HIGH (ref 70–99)
Potassium: 3.6 mmol/L (ref 3.5–5.1)
SODIUM: 142 mmol/L (ref 135–145)
Total Bilirubin: 0.5 mg/dL (ref 0.3–1.2)
Total Protein: 6.9 g/dL (ref 6.5–8.1)

## 2018-07-09 LAB — SALICYLATE LEVEL: Salicylate Lvl: <7 mg/dL (ref 2.8–30.0)

## 2018-07-09 LAB — CBC
HCT: 36.7 % (ref 36.0–46.0)
Hemoglobin: 11.7 g/dL — ABNORMAL LOW (ref 12.0–15.0)
MCH: 29.7 pg (ref 26.0–34.0)
MCHC: 31.9 g/dL (ref 30.0–36.0)
MCV: 93.1 fL (ref 80.0–100.0)
NRBC: 0 % (ref 0.0–0.2)
Platelets: 213 10*3/uL (ref 150–400)
RBC: 3.94 MIL/uL (ref 3.87–5.11)
RDW: 12.7 % (ref 11.5–15.5)
WBC: 4.4 10*3/uL (ref 4.0–10.5)

## 2018-07-09 LAB — ACETAMINOPHEN LEVEL: Acetaminophen (Tylenol), Serum: <10 ug/mL — ABNORMAL LOW (ref 10–30)

## 2018-07-09 LAB — ETHANOL: Alcohol, Ethyl (B): <10 mg/dL (ref <10)

## 2018-07-09 NOTE — ED Triage Notes (Addendum)
Brought by EMS from [facility]. Pt has a clock on the wall. She took it off the wall and broke the plastic cover, and tried to cut her forearm with it saying repeatedly, "I die, I die". Speaks [place] There is no broken skin. Her son was there and she told him that she wants to kill herself. He said that she is like this because the family has not been to see her. She has a history of depression

## 2018-07-09 NOTE — ED Notes (Signed)
Phlebotomy at bedside to draw labs

## 2018-07-09 NOTE — BH Assessment (Signed)
Assessment Note  Margaret [name] is an 85 y.o. female.  -Clinician reviewed note by Griffin Dakin, RN.  Brought by EMS from [facility]. Pt has a clock on the wall. She took it off the wall and broke the plastic cover, and tried to cut her forearm with it saying repeatedly, "I die, I die". Speaks [place] There is no broken skin. Her son was there and she told him that she wants to kill herself. He said that she is like this because the family has not been to see her. She has a history of depression.  Patient speaks [place] primarily and very little english.  Clinician got the video language interpreter and brought up a interpreter for [place]  Patient did not interact with the interpreter, she would answer "no" to questions.  Unclear as to why she did not talk to the interpreter.  Patient made motion of cutting her arm and said "no" to clinician.  Clinician asked if it was okay to call her son and she said "yes."    Clinician called Mr. Delene Loll [phone], her son.  His english is okay but not fluent.  He says that she is depressed because she does not want to live at [facility] anymore.  She has been there for past 5-6 years.  Patient has no other vietnamese peers to talk to and family is not able to visit as often as they would like.  Son said she has said she is depressed.  He also talked about family discussing whether she should move back to [place].    Clinician asked if she had talked about suicide before and he said no.  No HI or A/V hallucination per son.  No previous inpatient or outpatient psychiatric history.  Patient's son was asked if a family member could Gaetana available tomorrow between 10am-noon to provide information.  He took the number for the [facility] nurses station area.  -Clinician discussed patient care with Nira Conn, FNP.  He recommended patient Terris observed overnight.  More information to Darionna gathered from family tomorrow.  Clinician informed Dr. Judd Lien of  disposition.  Diagnosis: F32.2 MDD single episode severe  Past Medical History:  Past Medical History:  Diagnosis Date  . Breast mass, right 12/13/2013  . Chronic diastolic CHF (congestive heart failure) (HCC) 07/19/2013  . Chronic diastolic heart failure (HCC)   . Chronic kidney disease, stage II (mild)   . Chronic renal disease, stage 2, mildly decreased glomerular filtration rate (GFR) between 60-89 mL/min/1.73 square meter 09/03/2014  . Closed T12 fracture (HCC) 07/16/2013  . Depression 07/18/2013  . Diabetes mellitus   . Diabetes mellitus due to underlying condition (HCC) 06/21/2013  . Diabetes mellitus without complication (HCC)   . Dysphagia, oropharyngeal phase   . GERD (gastroesophageal reflux disease) 12/17/2014  . Gout   . Hypertension   . Hypertensive heart disease with congestive heart failure (HCC) 06/21/2013  . Iron deficiency anemia 09/03/2014  . Iron deficiency anemia, unspecified   . Leukocytosis 07/18/2013  . Lumbago 10/23/2013  . Lung nodule < 6cm on CT 11/13/2016  . Osteopenia 07/16/2013  . Syncope 06/21/2013  . Type 2 diabetes, controlled, with neuropathy (HCC) 10/18/2013  . Unspecified constipation 10/18/2013  . Vitamin D deficiency 04/11/2016    Past Surgical History:  Procedure Laterality Date  . NO PAST SURGERIES      Family History: History reviewed. No pertinent family history.  Social History:  reports that she has quit  smoking. She has never used smokeless tobacco. She reports that she does not drink alcohol or use drugs.  Additional Social History:  Alcohol / Drug Use Pain Medications: See PTA meds list from [facility] Prescriptions: See PTA medication list from [facility] Over the Counter: see PTA med list History of alcohol / drug use?: No history of alcohol / drug abuse  CIWA: CIWA-Ar BP: 128/84 Pulse Rate: 68 COWS:    Allergies:  Allergies  Allergen Reactions  . Tequin [Gatifloxacin]     Home Medications: (Not in a hospital  admission)   OB/GYN Status:  No LMP recorded (lmp unknown). Patient is postmenopausal.  General Assessment Data Location of Assessment: [facility] ED TTS Assessment: In system Is this a Tele or Face-to-Face Assessment?: Face-to-Face Is this an Initial Assessment or a Re-assessment for this encounter?: Initial Assessment Patient Accompanied by:: N/A Language Other than English: Yes What is your preferred language: Other (Comment: Enter the language)([place]namese) Living Arrangements: In Assisted Living/Nursing Home (Comment: Name of Nursing Home([facility]) What gender do you identify as?: Female Marital status: Widowed Pregnancy Status: No Living Arrangements: Other (Comment)([facility]) Can pt return to current living arrangement?: Yes Admission Status: Voluntary Is patient capable of signing voluntary admission?: Yes Referral Source: Other(Nursing home staff) [facility]     Crisis Care Plan Living Arrangements: Other (Comment)([facility]) Name of Psychiatrist: None Name of Therapist: [facility]  Education Status Is patient currently in school?: No Is the patient employed, unemployed or receiving disability?: Unemployed  Risk to self with the past 6 months Suicidal Ideation: Yes-Currently Present Has patient been a risk to self within the past 6 months prior to admission? : No Suicidal Intent: No Has patient had any suicidal intent within the past 6 months prior to admission? : No Is patient at risk for suicide?: Yes Suicidal Plan?: Yes-Currently Present Has patient had any suicidal plan within the past 6 months prior to admission? : No Specify Current Suicidal Plan: Reportedly tried to cut her wrists. Access to Means: No What has been your use of drugs/alcohol within the last 12 months?: None Previous Attempts/Gestures: No How many times?: 0 Other Self Harm Risks: None Triggers for Past Attempts: None known Intentional Self Injurious  Behavior: None Family Suicide History: Unknown Recent stressful life event(s): Other (Comment)(Social isolation; in a nursing home) Persecutory voices/beliefs?: No Depression: Yes Depression Symptoms: Despondent, Isolating, Loss of interest in usual pleasures, Feeling worthless/self pity Substance abuse history and/or treatment for substance abuse?: No Suicide prevention information given to non-admitted patients: Not applicable  Risk to Others within the past 6 months Homicidal Ideation: No Does patient have any lifetime risk of violence toward others beyond the six months prior to admission? : No Thoughts of Harm to Others: No Current Homicidal Intent: No Current Homicidal Plan: No Access to Homicidal Means: No Identified Victim: No one History of harm to others?: No Assessment of Violence: None Noted Violent Behavior Description: None reported Does patient have access to weapons?: No Criminal Charges Pending?: No Does patient have a court date: No Is patient on probation?: No  Psychosis Hallucinations: None noted Delusions: None noted  Mental Status Report Appearance/Hygiene: Unremarkable, In hospital gown Eye Contact: Good Motor Activity: Freedom of movement, Unsteady(Uses w/c) Speech: Other (Comment)(Says "no" to most interrogatives.) Level of Consciousness: Alert Mood: Anxious, Apprehensive Affect: Anxious Anxiety Level: Moderate Thought Processes: Unable to Assess Judgement: Unable to Assess Orientation: Unable to assess Obsessive Compulsive Thoughts/Behaviors: Unable to  Assess  Cognitive Functioning Concentration: Unable to Assess Memory: Unable to Assess Is patient IDD: No Insight: Unable to Assess Impulse Control: Unable to Assess Appetite: Fair Have you had any weight changes? : No Change Sleep: Unable to Assess Total Hours of Sleep: (Unknown) Vegetative Symptoms: Unable to Assess  ADLScreening [facility] Assessment Services) Patient's cognitive ability  adequate to safely complete daily activities?: Yes Patient able to express need for assistance with ADLs?: Yes Independently performs ADLs?: Yes (appropriate for developmental age)  Prior Inpatient Therapy Prior Inpatient Therapy: No  Prior Outpatient Therapy Prior Outpatient Therapy: No Does patient have an ACCT team?: No Does patient have Intensive In-House Services?  : No Does patient have [facility] services? : No Does patient have P4CC services?: No  ADL Screening (condition at time of admission) Patient's cognitive ability adequate to safely complete daily activities?: Yes Is the patient deaf or have difficulty hearing?: Yes Does the patient have difficulty seeing, even when wearing glasses/contacts?: No Does the patient have difficulty concentrating, remembering, or making decisions?: Yes Patient able to express need for assistance with ADLs?: Yes Does the patient have difficulty dressing or bathing?: Yes Independently performs ADLs?: Yes (appropriate for developmental age) Does the patient have difficulty walking or climbing stairs?: Yes Weakness of Legs: None Weakness of Arms/Hands: None       Abuse/Neglect Assessment (Assessment to Daylen complete while patient is alone) Abuse/Neglect Assessment Can Francies Completed: Yes Physical Abuse: Denies Verbal Abuse: Denies Sexual Abuse: Denies Exploitation of patient/patient's resources: Denies Self-Neglect: Denies     Merchant navy officer (For Healthcare) Does Patient Have a Medical Advance Directive?: No Would patient like information on creating a medical advance directive?: No - Patient declined          Disposition:  Disposition Initial Assessment Completed for this Encounter: Yes Patient referred to: Other (Comment)(Psychiatry to review.)  On Site Evaluation by:   Reviewed with Physician:    Alexandria Lodge 07/09/2018 10:24 PM

## 2018-07-09 NOTE — ED Notes (Signed)
Bed: WA27 Expected date:  Expected time:  Means of arrival:  Comments: EMS-[facility] 

## 2018-07-09 NOTE — ED Notes (Signed)
[facility] provider at bedside with interpreter monitor.

## 2018-07-09 NOTE — ED Notes (Signed)
EDMD at bedside

## 2018-07-09 NOTE — ED Provider Notes (Signed)
[facility]-EMERGENCY DEPT Provider Note   CSN: [id number] Arrival date & time: 07/09/18  1846     History   Chief Complaint Chief Complaint  Patient presents with  . Medical Clearance    HPI Margaret [name] is a 85 y.o. female.  Patient is an 85 year old female with history of diabetes, chronic renal insufficiency, hypertension.  She presents today for evaluation of suicidal threats.  She was brought from her assisted living facility where I am told she broke the clock on the wall and attempted to use a plastic from the cover to cut her wrist, stating the words "I die, I die".  Patient speaks extremely little English and history is difficult to obtain.  [facility]  The history is provided by the patient.    Past Medical History:  Diagnosis Date  . Breast mass, right 12/13/2013  . Chronic diastolic CHF (congestive heart failure) (HCC) 07/19/2013  . Chronic diastolic heart failure (HCC)   . Chronic kidney disease, stage II (mild)   . Chronic renal disease, stage 2, mildly decreased glomerular filtration rate (GFR) between 60-89 mL/min/1.73 square meter 09/03/2014  . Closed T12 fracture (HCC) 07/16/2013  . Depression 07/18/2013  . Diabetes mellitus   . Diabetes mellitus due to underlying condition (HCC) 06/21/2013  . Diabetes mellitus without complication (HCC)   . Dysphagia, oropharyngeal phase   . GERD (gastroesophageal reflux disease) 12/17/2014  . Gout   . Hypertension   . Hypertensive heart disease with congestive heart failure (HCC) 06/21/2013  . Iron deficiency anemia 09/03/2014  . Iron deficiency anemia, unspecified   . Leukocytosis 07/18/2013  . Lumbago 10/23/2013  . Lung nodule < 6cm on CT 11/13/2016  . Osteopenia 07/16/2013  . Syncope 06/21/2013  . Type 2 diabetes, controlled, with neuropathy (HCC) 10/18/2013  . Unspecified constipation 10/18/2013  . Vitamin D deficiency 04/11/2016    Patient Active Problem List   Diagnosis Date Noted  . Hyperlipidemia associated with type 2 diabetes mellitus (HCC) 06/10/2018  . Lung nodule < 6cm on CT 11/13/2016  . Vitamin D deficiency 04/11/2016  . Hyperlipidemia 02/11/2016  . Pain 11/07/2015  . Wheezing 11/07/2015  . Edema 10/25/2015  . Conjunctivitis 07/09/2015  . UTI (urinary tract infection) 03/24/2015  . GERD (gastroesophageal reflux disease) 12/17/2014  . Hypokalemia 12/17/2014  . Depression 11/25/2014  . Iron deficiency anemia 09/03/2014  . Chronic renal disease, stage 2, mildly decreased glomerular filtration rate (GFR) between 60-89 mL/min/1.73 square meter 09/03/2014  . Pain in joint, lower leg 08/21/2014  . Cough 01/27/2014  . Dizziness 01/12/2014  . Contusion of unspecified site 01/12/2014  . Breast mass, right 12/13/2013  . Lumbago 10/23/2013  . Type 2 diabetes, controlled, with neuropathy (HCC) 10/18/2013  . 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 Unspecified constipation 10/18/2013  . Plantar fasciitis, left 07/29/2013  . Physical deconditioning 07/21/2013  . Diabetes mellitus, controlled (HCC) 07/19/2013  . Chronic diastolic CHF (congestive heart failure) (HCC) 07/19/2013  . HCAP (healthcare-associated pneumonia) 07/18/2013  . Chronic diastolic heart failure (HCC) 06/24/2013  . Syncope 06/21/2013  . Syncope and collapse 06/21/2013  . Leukocytosis 06/21/2013  . Hypertensive heart disease with congestive heart failure (HCC) 06/21/2013  . Diabetes mellitus due to underlying condition (HCC) 06/21/2013    Past Surgical History:  Procedure Laterality Date  . NO PAST SURGERIES       OB History   No obstetric history on  file.      Home Medications    Prior to Admission medications   Medication Sig Start Date End Date Taking? Authorizing Provider  aspirin 81 MG chewable tablet Chew 1 tablet (81 mg total) by mouth daily. 06/24/13   Hollice EspyKrishnan, Sendil K, MD  ferrous sulfate 325 (65 FE) MG  tablet Take 325 mg by mouth daily with breakfast.     [provider]  furosemide (LASIX) 40 MG tablet Take 40 mg by mouth every morning.  04/08/13   [provider]  gabapentin (NEURONTIN) 300 MG capsule Take 1 capsule (300 mg total) by mouth at bedtime. 07/22/13   Catarina Hartshornat, David, MD  ipratropium-albuterol (DUONEB) 0.5-2.5 (3) MG/3ML SOLN Take 3 mLs by nebulization every 6 (six) hours as needed.    [provider]  metFORMIN (GLUCOPHAGE-XR) 500 MG 24 hr tablet Take 500 mg by mouth every evening. For diabetes    [provider]  NUTRITIONAL SUPPLEMENT LIQD Take by mouth. Magic cup-take one cup with dinner as supplement    [provider]  omeprazole (PRILOSEC) 20 MG capsule Take 20 mg by mouth daily. For GERD    [provider]  potassium chloride (K-DUR) 10 MEQ tablet Take 30 mEq by mouth daily. Take 3 tablets potassium supplement    [provider]  traMADol (ULTRAM) 50 MG tablet Take 50 mg by mouth 2 (two) times daily.    [provider]  Vitamin D, Ergocalciferol, (DRISDOL) 50000 units CAPS capsule Take 50,000 Units by mouth every 7 (seven) days.    [provider]    Family History History reviewed. No pertinent family history.  Social History Social History   Tobacco Use  . Smoking status: Former Games developermoker  . Smokeless tobacco: Never Used  Substance Use Topics  . Alcohol use: No  . Drug use: No     Allergies   Tequin [gatifloxacin]   Review of Systems Review of Systems  All other systems reviewed and are negative.    Physical Exam Updated Vital Signs BP 128/84 (BP Location: Left Arm)   Pulse 68   Temp 99 F (37.2 C)   Resp 19   LMP  (LMP Unknown)   SpO2 98%   Physical Exam Vitals signs and nursing note reviewed.  Constitutional:      General: She is not in acute distress.    Appearance: She is well-developed. She is not diaphoretic.  HENT:     Head: Normocephalic and atraumatic.  Neck:      Musculoskeletal: Normal range of motion and neck supple.  Cardiovascular:     Rate and Rhythm: Normal rate and regular rhythm.     Heart sounds: No murmur. No friction rub. No gallop.   Pulmonary:     Effort: Pulmonary effort is normal. No respiratory distress.     Breath sounds: Normal breath sounds. No wheezing.  Abdominal:     General: Bowel sounds are normal. There is no distension.     Palpations: Abdomen is soft.     Tenderness: There is no abdominal tenderness.  Musculoskeletal: Normal range of motion.  Skin:    General: Skin is warm and dry.  Neurological:     Mental Status: She is alert and oriented to person, place, and time.      ED Treatments / Results  Labs (all labs ordered are listed, but only abnormal results are displayed) Labs Reviewed  COMPREHENSIVE METABOLIC PANEL  ETHANOL  SALICYLATE LEVEL  ACETAMINOPHEN LEVEL  CBC  RAPID URINE DRUG SCREEN, HOSP PERFORMED  URINALYSIS, ROUTINE W REFLEX MICROSCOPIC    EKG None  Radiology No results found.  Procedures Procedures (including critical care time)  Medications Ordered in ED Medications - No data to display   Initial Impression / Assessment and Plan / ED Course  I have reviewed the triage vital signs and the nursing notes.  Pertinent labs & imaging results that were available during my care of the patient were reviewed by me and considered in my medical decision making (see chart for details).  Patient seen by TTS.  Patient with limited English and difficulty obtaining a translator to assist.  TTS did speak with patient's son who states that she is depressed.  Patient will Daya observed overnight in the ER and undergo official psychiatric consultation in the morning.  Arrangements have been made for the family to Martha present to serve as translators.  Final Clinical Impressions(s) / ED Diagnoses   Final diagnoses:  None    ED Discharge Orders    None       Geoffery Lyons, MD 07/09/18  2307

## 2018-07-10 DIAGNOSIS — Z743 Need for continuous supervision: Secondary | ICD-10-CM | POA: Diagnosis not present

## 2018-07-10 DIAGNOSIS — R0902 Hypoxemia: Secondary | ICD-10-CM | POA: Diagnosis not present

## 2018-07-10 DIAGNOSIS — F329 Major depressive disorder, single episode, unspecified: Secondary | ICD-10-CM | POA: Diagnosis not present

## 2018-07-10 DIAGNOSIS — R279 Unspecified lack of coordination: Secondary | ICD-10-CM | POA: Diagnosis not present

## 2018-07-10 DIAGNOSIS — F322 Major depressive disorder, single episode, severe without psychotic features: Secondary | ICD-10-CM | POA: Diagnosis not present

## 2018-07-10 MED ORDER — FERROUS SULFATE 325 (65 FE) MG PO TABS
325.0000 mg | ORAL_TABLET | Freq: Every day | ORAL | Status: DC
  Administered 2018-07-10: 325 mg via ORAL
  Filled 2018-07-10 (×2): qty 1

## 2018-07-10 MED ORDER — FUROSEMIDE 40 MG PO TABS
40.0000 mg | ORAL_TABLET | Freq: Every morning | ORAL | Status: DC
  Administered 2018-07-10: 40 mg via ORAL
  Filled 2018-07-10: qty 1

## 2018-07-10 MED ORDER — GABAPENTIN 300 MG PO CAPS
300.0000 mg | ORAL_CAPSULE | Freq: Every day | ORAL | Status: DC
  Administered 2018-07-10: 300 mg via ORAL
  Filled 2018-07-10: qty 1

## 2018-07-10 MED ORDER — BISACODYL 10 MG RE SUPP
10.0000 mg | RECTAL | Status: DC | PRN
  Filled 2018-07-10: qty 1

## 2018-07-10 MED ORDER — POTASSIUM CHLORIDE ER 10 MEQ PO TBCR
30.0000 meq | EXTENDED_RELEASE_TABLET | Freq: Every day | ORAL | Status: DC
  Administered 2018-07-10: 30 meq via ORAL
  Filled 2018-07-10 (×2): qty 3

## 2018-07-10 MED ORDER — ASPIRIN 81 MG PO CHEW
81.0000 mg | CHEWABLE_TABLET | Freq: Every day | ORAL | Status: DC
  Administered 2018-07-10: 81 mg via ORAL
  Filled 2018-07-10: qty 1

## 2018-07-10 MED ORDER — VITAMIN D (ERGOCALCIFEROL) 1.25 MG (50000 UNIT) PO CAPS: 50000.0000 [IU] | ORAL_CAPSULE | ORAL | Status: DC

## 2018-07-10 MED ORDER — METFORMIN HCL ER 500 MG PO TB24: 500.0000 mg | ORAL_TABLET | Freq: Every evening | ORAL | Status: DC

## 2018-07-10 MED ORDER — IPRATROPIUM-ALBUTEROL 0.5-2.5 (3) MG/3ML IN SOLN: 3.0000 mL | Freq: Four times a day (QID) | RESPIRATORY_TRACT | Status: DC | PRN

## 2018-07-10 MED ORDER — TRAMADOL HCL 50 MG PO TABS
50.0000 mg | ORAL_TABLET | Freq: Two times a day (BID) | ORAL | Status: DC
  Administered 2018-07-10 (×2): 50 mg via ORAL
  Filled 2018-07-10 (×2): qty 1

## 2018-07-10 NOTE — BH Assessment (Signed)
[facility] Assessment Progress Note  Per Juanetta Beets, DO, this pt does not require psychiatric hospitalization at this time.  Pt is to [place] discharged from [facility].  No behavioral health referrals are required.  Archie Balboa, CSW, agrees to facilitate pt's return to her residential facility.  Pt's nurse, Raven, has been notified.  Doylene Canning, MA Triage Specialist [phone]

## 2018-07-10 NOTE — Progress Notes (Signed)
PHARMACIST - PHYSICIAN COMMUNICATION DR:  Molpus CONCERNING:  METFORMIN SAFE ADMINISTRATION POLICY  RECOMMENDATION: Metformin has been placed on DISCONTINUE (rejected order) STATUS and should [name] reordered only after any of the conditions below are ruled out.   DESCRIPTION:  The Pharmacy Committee has adopted a policy that restricts the use of metformin in hospitalized patients until all the contraindications to administration have been ruled out:  '[x]'  eGFR below 30 mL/min/1.44m '[]'  Acute or chronic metabolic acidosis (including DKA) '[]'  Shock, acute MI, sepsis, hypoxemia, dehydration '[]'  Administration of intra-arterial iodinated contrast: impending, or completed within past 48 hours* '[]'  Administration of intravenous iodinated contrast: impending, or completed within past 48 hours, IF any of the following risk factors are also present**: . eGFR 30 - 60 mL/min/1.748m. history of hepatic impairment, alcoholism, or heart failure  ** When metformin is stopped due to administration of iodinated contrast, recommend re-evaluating eGFR 48 hours after the imaging procedure and restarting metformin if renal function is stable and no other contraindications are present.  [name], [name], RPNorth River Surgery Center/14/2020 1:57 AM

## 2018-07-10 NOTE — Consult Note (Addendum)
[facility]  07/10/2018 12:27 PM Margaret [name]  MRN:  [id number] Principal Problem: Depression Discharge Diagnoses: Principal Problem:   Depression   Subjective: Patient assessed and case reviewed. Patient speaks [place], her granddaughter Clydie BraunKaren is at the bedside to assist with interpretation. Interpreter relay machine is unavailable at the time of this evaluation. Patient reports a "temper tantrum" due to family not coming to visit her as often. As per granddaughter patient [place] [facility] [place] and is unable to visit as much as she used to because she lives so far, and her grandmother has a hard time processing this. She denies any previous mental illness, outpatient therapy, inpatient admissions. She states her appetite, fluid intake and sleep have been normal. She denies any family history of mental illness, substance abuse, and or legal charges at this time. Patient denies any history of suicide attempt, non- suicidal self injurious behavior, suicidal ideations, homicidal ideations and or hallucinations. Patient does not meet criteria for inpatient admission and will Estreya discharged to nursing home facility.   Total Time spent with patient: 30 minutes  Past Psychiatric History: None Past Medical History:  Past Medical History:  Diagnosis Date  . Breast mass, right 12/13/2013  . Chronic diastolic CHF (congestive heart failure) (HCC) 07/19/2013  . Chronic diastolic heart failure (HCC)   . Chronic kidney disease, stage II (mild)   . Chronic renal disease, stage 2, mildly decreased glomerular filtration rate (GFR) between 60-89 mL/min/1.73 square meter 09/03/2014  . Closed T12 fracture (HCC) 07/16/2013  . Depression 07/18/2013  . Diabetes mellitus   . Diabetes mellitus due to underlying condition (HCC) 06/21/2013  . Diabetes mellitus without complication (HCC)   . Dysphagia, oropharyngeal phase   . GERD (gastroesophageal reflux disease) 12/17/2014  . Gout   . Hypertension   .  Hypertensive heart disease with congestive heart failure (HCC) 06/21/2013  . Iron deficiency anemia 09/03/2014  . Iron deficiency anemia, unspecified   . Leukocytosis 07/18/2013  . Lumbago 10/23/2013  . Lung nodule < 6cm on CT 11/13/2016  . Osteopenia 07/16/2013  . Syncope 06/21/2013  . Type 2 diabetes, controlled, with neuropathy (HCC) 10/18/2013  . Unspecified constipation 10/18/2013  . Vitamin D deficiency 04/11/2016    Past Surgical History:  Procedure Laterality Date  . NO PAST SURGERIES     Family History: History reviewed. No pertinent family history. Family Psychiatric  History: None Social History:  Social History   Substance and Sexual Activity  Alcohol Use No     Social History   Substance and Sexual Activity  Drug Use No    Social History   Socioeconomic History  . Marital status: Single    Spouse name: Not on file  . Number of children: Not on file  . Years of education: Not on file  . Highest education level: Not on file  Occupational History  . Occupation: Domestic  Engineer, productionocial Needs  . Financial resource strain: Not on file  . Food insecurity:    Worry: Not on file    Inability: Not on file  . Transportation needs:    Medical: Not on file    Non-medical: Not on file  Tobacco Use  . Smoking status: Former Games developermoker  . Smokeless tobacco: Never Used  Substance and Sexual Activity  . Alcohol use: No  . Drug use: No  . Sexual activity: Not Currently  Lifestyle  . Physical activity:    Days per week: Not on file  Minutes per session: Not on file  . Stress: Not on file  Relationships  . Social connections:    Talks on phone: Not on file    Gets together: Not on file    Attends religious service: Not on file    Active member of club or organization: Not on file    Attends meetings of clubs or organizations: Not on file    Relationship status: Not on file  Other Topics Concern  . Not on file  Social History Narrative   ** Merged History Encounter **        ** Merged History Encounter **      Admitted to [facility] 07/22/13   Never married   Former smoker   Alcohol none   Full Code           Has this patient used any form of tobacco in the last 30 days? (Cigarettes, Smokeless Tobacco, Cigars, and/or Pipes) Prescription not provided because: patient is not a smoker  Current Medications: Current Facility-Administered Medications  Medication Dose Route Frequency Provider Last Rate Last Dose  . aspirin chewable tablet 81 mg  81 mg Oral Daily Molpus, John, MD   81 mg at 07/10/18 1005  . bisacodyl (DULCOLAX) suppository 10 mg  10 mg Rectal PRN Molpus, John, MD      . ferrous sulfate tablet 325 mg  325 mg Oral Q breakfast Molpus, John, MD   325 mg at 07/10/18 1005  . furosemide (LASIX) tablet 40 mg  40 mg Oral q morning - 10a Molpus, John, MD   40 mg at 07/10/18 1005  . gabapentin (NEURONTIN) capsule 300 mg  300 mg Oral QHS Molpus, John, MD   300 mg at 07/10/18 0220  . ipratropium-albuterol (DUONEB) 0.5-2.5 (3) MG/3ML nebulizer solution 3 mL  3 mL Nebulization Q6H PRN Molpus, John, MD      . potassium chloride (K-DUR) CR tablet 30 mEq  30 mEq Oral Daily Molpus, John, MD   30 mEq at 07/10/18 1005  . traMADol (ULTRAM) tablet 50 mg  50 mg Oral BID Molpus, John, MD   50 mg at 07/10/18 1005  . [START ON 07/11/2018] Vitamin D (Ergocalciferol) (DRISDOL) capsule 50,000 Units  50,000 Units Oral Q7 days Molpus, John, MD       Current Outpatient Medications  Medication Sig Dispense Refill  . aspirin 81 MG chewable tablet Chew 1 tablet (81 mg total) by mouth daily.    . bisacodyl (DULCOLAX) 10 MG suppository Place 10 mg rectally as needed for moderate constipation (constipation not relieved by milk of magnesia).    . ferrous sulfate 325 (65 FE) MG tablet Take 325 mg by mouth daily with breakfast.     . furosemide (LASIX) 40 MG tablet Take 40 mg by mouth every morning.     . gabapentin (NEURONTIN) 300 MG capsule Take 1 capsule (300 mg total) by mouth at  bedtime. 30 capsule 1  . ipratropium-albuterol (DUONEB) 0.5-2.5 (3) MG/3ML SOLN Take 3 mLs by nebulization every 6 (six) hours as needed.    . magnesium hydroxide (MILK OF MAGNESIA) 400 MG/5ML suspension Take 30 mLs by mouth as needed (if no bowel movement in 3 days 1x/24 hours.).     Marland Kitchen metFORMIN (GLUCOPHAGE-XR) 500 MG 24 hr tablet Take 500 mg by mouth every evening. For diabetes    . NUTRITIONAL SUPPLEMENT LIQD Take by mouth. "Magic cup"-take one cup with dinner as supplement    . potassium chloride (K-DUR) 10 MEQ  tablet Take 30 mEq by mouth daily. Take 3 tablets potassium supplement    . Sodium Phosphates (RA SALINE ENEMA RE) Place 1 enema rectally as needed (constipation unrelieved by bisacodyl suppository 1x/24 hours.).    Marland Kitchen traMADol (ULTRAM) 50 MG tablet Take 50 mg by mouth 2 (two) times daily.    . Vitamin D, Ergocalciferol, (DRISDOL) 50000 units CAPS capsule Take 50,000 Units by mouth every 7 (seven) days.     PTA Medications: (Not in a hospital admission)   Musculoskeletal: Strength & Muscle Tone: within normal limits Gait & Station: normal Patient leans: N/A  Psychiatric Specialty Exam: Physical Exam  Nursing note and vitals reviewed. Constitutional: She is oriented to person, place, and time. She appears well-developed.  HENT:  Head: Normocephalic.  Neck: Normal range of motion.  Musculoskeletal: Normal range of motion.  Neurological: She is alert and oriented to person, place, and time.  Psychiatric: She has a normal mood and affect. Her speech is normal and behavior is normal. Judgment and thought content normal. Cognition and memory are normal.    Review of Systems  Gastrointestinal: Negative for abdominal pain, blood in stool, constipation, diarrhea, heartburn, melena, nausea and vomiting.  Psychiatric/Behavioral: Positive for depression. Negative for hallucinations, memory loss, substance abuse and suicidal ideas. The patient is not nervous/anxious and does not have  insomnia.   All other systems reviewed and are negative.   Blood pressure (!) 147/95, pulse 84, temperature 98.8 F (37.1 C), temperature source Oral, resp. rate 16, SpO2 95 %.There is no height or weight on file to calculate BMI.  General Appearance: Fairly Groomed in paper scrubs  Eye Contact:  Fair  Speech:  Clear and Coherent and Normal Rate  Volume:  normal speech, age appropriate.   Mood:  Euthymic  Affect:  Congruent  Thought Process:  Coherent, Linear and Descriptions of Associations: Intact  Orientation:  Full (Time, Place, and Person)  Thought Content:  Logical  Suicidal Thoughts:  No  Homicidal Thoughts:  No  Memory:  Immediate;   Fair  Judgement:  Intact  Insight:  Fair  Psychomotor Activity:  Normal  Concentration:  Concentration: Fair and Attention Span: Fair  Recall:  [employer] of Knowledge:  Fair  Language:  Fair  Akathisia:  No  Handed:  Right  AIMS (if indicated):   N/A  Assets:  Communication Skills Desire for Improvement Financial Resources/Insurance Housing Leisure Time Social Support  ADL's:  Intact  Cognition:  WNL  Sleep:   N/A     Demographic Factors:  NA  Loss Factors: decrease in social and familial interactions  Historical Factors: NA  Risk Reduction Factors:   Sense of responsibility to family, Religious beliefs about death, Positive social support, Positive therapeutic relationship and Positive coping skills or problem solving skills  Continued Clinical Symptoms:  Medical Diagnoses and Treatments/Surgeries  Cognitive Features That Contribute To Risk:  None    Suicide Risk:  Minimal: No identifiable suicidal ideation.  Patients presenting with no risk factors but with morbid ruminations; may Jobina classified as minimal risk based on the severity of the depressive symptoms    Plan Of Care/Follow-up recommendations:  Activity:  Regular activity as tolerated Diet:  Regular diet  Tests:  routine tests as determined by outpatient  psychiatrist.  Other:  continue taking medications as directed.   Disposition: Discharge nursing facility.  Maryagnes Amos, FNP 07/10/2018, 12:27 PM   Patient seen face-to-face for psychiatric evaluation, chart reviewed and case discussed with the physician  extender and developed treatment plan. Reviewed the information documented and agree with the treatment plan.  Juanetta Beets, DO 07/10/18 7:36 PM

## 2018-07-10 NOTE — Progress Notes (Addendum)
CSW aware patient has been psychiatrically and medically cleared for discharge. CSW aware patient is from [facility]. CSW has attempted to reach out to [facility], Insurance claims handler, regarding disposition plan. CSW left voicemail for return call.  12:14pm- CSW received return call from [facility]. Per Lowella Bandy, patient is able to return once they have received AVS Summary. Nikki confirmed AVS Summary was received and that we could call for transport. CSW has updated patient's RN. Number to call report is [phone].   Archie Balboa, LCSWA  Clinical Social Work Department  [employer]  [phone]

## 2018-07-10 NOTE — ED Notes (Signed)
Margaret [name] made aware of patient discharge and report given. Granddaughter [name] was updated as well.

## 2018-07-11 ENCOUNTER — Encounter: Payer: Self-pay | Admitting: Internal Medicine

## 2018-07-11 NOTE — Progress Notes (Signed)
Location:  [facility]   Place of Service:  SNF (31)Skilled nursing facility  [name], Mehki Klumpp D, MD  Patient Care Team: [name], Lamyah Creed D, MD as PCP - General (Internal Medicine)  Extended Emergency Contact Information Primary Emergency Contact: Reinitz,[name] Address: [address]          [place], [place] [address] [place] Home Phone: [phone] Mobile Phone: [phone] Relation: Sister Secondary Emergency Contact: [name],[name] Address: [address]          [place], [place] [address] [place] Home Phone: [phone] Relation: Granddaughter    Allergies: Tequin [gatifloxacin]  Chief Complaint  Patient presents with  . Acute Visit    HPI: Patient is [age] y.o. female who who I am seeing along with the DON.  The patient had taken the clock off of her wall- she would have had to climb on something to get this done-, then broken the clock on the floor and taken a sharp piece of it and was trying to cut her wrist and inside of her arm saying I want to die.  When I saw her she was in the dining room sitting at her table.  I looked at her arm and she had a few abrasions she had not cut her skin.  She did not seem agitated at this time  Past Medical History:  Diagnosis Date  . Breast mass, right 12/13/2013  . Chronic diastolic CHF (congestive heart failure) (HCC) 07/19/2013  . Chronic diastolic heart failure (HCC)   . Chronic kidney disease, stage II (mild)   . Chronic renal disease, stage 2, mildly decreased glomerular filtration rate (GFR) between 60-89 mL/min/1.73 square meter 09/03/2014  . Closed T12 fracture (HCC) 07/16/2013  . Depression 07/18/2013  . Diabetes mellitus   . Diabetes mellitus due to underlying condition (HCC) 06/21/2013  . Diabetes mellitus without complication (HCC)   . Dysphagia, oropharyngeal phase   . GERD (gastroesophageal reflux disease) 12/17/2014  . Gout   . Hypertension   . Hypertensive  heart disease with congestive heart failure (HCC) 06/21/2013  . Iron deficiency anemia 09/03/2014  . Iron deficiency anemia, unspecified   . Leukocytosis 07/18/2013  . Lumbago 10/23/2013  . Lung nodule < 6cm on CT 11/13/2016  . Osteopenia 07/16/2013  . Syncope 06/21/2013  . Type 2 diabetes, controlled, with neuropathy (HCC) 10/18/2013  . Unspecified constipation 10/18/2013  . Vitamin D deficiency 04/11/2016    Past Surgical History:  Procedure Laterality Date  . NO PAST SURGERIES      Allergies as of 07/09/2018      Reactions   Tequin [gatifloxacin]       Medication List       Accurate as of July 09, 2018 11:59 PM. Always use your most recent med list.        aspirin 81 MG chewable tablet Chew 1 tablet (81 mg total) by mouth daily.   bisacodyl 10 MG suppository Commonly known as:  DULCOLAX Place 10 mg rectally as needed for moderate constipation (constipation not relieved by milk of magnesia).   ferrous sulfate 325 (65 FE) MG tablet Take 325 mg by mouth daily with breakfast.   furosemide 40 MG tablet Commonly known as:  LASIX Take 40 mg by mouth every morning.   gabapentin 300 MG capsule Commonly known as:  NEURONTIN Take 1 capsule (300 mg total) by mouth at bedtime.   ipratropium-albuterol 0.5-2.5 (3) MG/3ML Soln Commonly known as:  DUONEB  Take 3 mLs by nebulization every 6 (six) hours as needed.   magnesium hydroxide 400 MG/5ML suspension Commonly known as:  MILK OF MAGNESIA Take 30 mLs by mouth as needed (if no bowel movement in 3 days 1x/24 hours.).   metFORMIN 500 MG 24 hr tablet Commonly known as:  GLUCOPHAGE-XR Take 500 mg by mouth every evening. For diabetes   NUTRITIONAL SUPPLEMENT Liqd Take by mouth. "Magic cup"-take one cup with dinner as supplement   potassium chloride 10 MEQ tablet Commonly known as:  K-DUR Take 30 mEq by mouth daily. Take 3 tablets potassium supplement   RA SALINE ENEMA RE Place 1 enema rectally as needed (constipation  unrelieved by bisacodyl suppository 1x/24 hours.).   traMADol 50 MG tablet Commonly known as:  ULTRAM Take 50 mg by mouth 2 (two) times daily.   Vitamin D (Ergocalciferol) 1.25 MG (50000 UT) Caps capsule Commonly known as:  DRISDOL Take 50,000 Units by mouth every 7 (seven) days.       No orders of the defined types were placed in this encounter.   Immunization History  Administered Date(s) Administered  . Influenza-Unspecified 03/23/2015, 04/06/2016, 04/06/2017  . PPD Test 08/12/2013  . Pneumococcal Polysaccharide-23 06/23/2013    Social History   Tobacco Use  . Smoking status: Former Games developer  . Smokeless tobacco: Never Used  Substance Use Topics  . Alcohol use: No    Review of Systems  DATA OBTAINED: from nurse-as per history of present illness GENERAL:  no fevers, fatigue, appetite changes SKIN: No itching, rash HEENT: No complaint RESPIRATORY: No cough, wheezing, SOB CARDIAC: No chest pain, palpitations, lower extremity edema  GI: No abdominal pain, No N/V/D or constipation, No heartburn or reflux  GU: No dysuria, frequency or urgency, or incontinence  MUSCULOSKELETAL: No unrelieved bone/joint pain NEUROLOGIC: No headache, dizziness  PSYCHIATRIC: No overt anxiety  Vitals:   07/11/18 2143  BP: 130/78  Pulse: 90  Resp: 18  Temp: 97.8 F (36.6 C)   Body mass index is 20.13 kg/m. Physical Exam  GENERAL APPEARANCE: Alert,  No acute distress  SKIN: A few very minor abrasions to left forearm HEENT: Unremarkable RESPIRATORY: Breathing is even, unlabored. Lung sounds are clear   CARDIOVASCULAR: Heart RRR no murmurs, rubs or gallops. No peripheral edema  GASTROINTESTINAL: Abdomen is soft, non-tender, not distended w/ normal bowel sounds.  GENITOURINARY: Bladder non tender, not distended  MUSCULOSKELETAL: No abnormal joints or musculature NEUROLOGIC: Cranial nerves 2-12 grossly intact. Moves all extremities PSYCHIATRIC: Mood and affect dementia  Patient  Active Problem List   Diagnosis Date Noted  . Hyperlipidemia associated with type 2 diabetes mellitus (HCC) 06/10/2018  . Lung nodule < 6cm on CT 11/13/2016  . Vitamin D deficiency 04/11/2016  . Hyperlipidemia 02/11/2016  . Pain 11/07/2015  . Wheezing 11/07/2015  . Edema 10/25/2015  . Conjunctivitis 07/09/2015  . UTI (urinary tract infection) 03/24/2015  . GERD (gastroesophageal reflux disease) 12/17/2014  . Hypokalemia 12/17/2014  . Depression 11/25/2014  . Iron deficiency anemia 09/03/2014  . Chronic renal disease, stage 2, mildly decreased glomerular filtration rate (GFR) between 60-89 mL/min/1.73 square meter 09/03/2014  . Pain in joint, lower leg 08/21/2014  . Cough 01/27/2014  . Dizziness 01/12/2014  . Contusion of unspecified site 01/12/2014  . Breast mass, right 12/13/2013  . Lumbago 10/23/2013  . Type 2 diabetes, controlled, with neuropathy (HCC) 10/18/2013  . 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 Unspecified constipation 10/18/2013  . Plantar fasciitis,  left 07/29/2013  . Physical deconditioning 07/21/2013  . Diabetes mellitus, controlled (HCC) 07/19/2013  . Chronic diastolic CHF (congestive heart failure) (HCC) 07/19/2013  . HCAP (healthcare-associated pneumonia) 07/18/2013  . Chronic diastolic heart failure (HCC) 06/24/2013  . Syncope 06/21/2013  . Syncope and collapse 06/21/2013  . Leukocytosis 06/21/2013  . Hypertensive heart disease with congestive heart failure (HCC) 06/21/2013  . Diabetes mellitus due to underlying condition (HCC) 06/21/2013    CMP     Component Value Date/Time   NA 142 07/09/2018 1949   NA 141 05/14/2018   K 3.6 07/09/2018 1949   CL 102 07/09/2018 1949   CO2 29 07/09/2018 1949   GLUCOSE 120 (H) 07/09/2018 1949   BUN 30 (H) 07/09/2018 1949   BUN 22 (A) 05/14/2018   CREATININE 1.47 (H) 07/09/2018 1949   CALCIUM 8.8 (L) 07/09/2018 1949   PROT 6.9 07/09/2018 1949   ALBUMIN 3.5 07/09/2018  1949   AST 18 07/09/2018 1949   ALT 11 07/09/2018 1949   ALKPHOS 63 07/09/2018 1949   BILITOT 0.5 07/09/2018 1949   GFRNONAA 32 (L) 07/09/2018 1949   GFRAA 38 (L) 07/09/2018 1949   Recent Labs    05/01/18 05/14/18 07/09/18 1949  NA 139 141 142  K 4.6 4.6 3.6  CL  --   --  102  CO2  --   --  29  GLUCOSE  --   --  120*  BUN 35* 22* 30*  CREATININE 1.4* 1.1 1.47*  CALCIUM  --   --  8.8*   Recent Labs    07/27/17 05/01/18 07/09/18 1949  AST 21 19 18   ALT 13 16 11   ALKPHOS 99 81 63  BILITOT  --   --  0.5  PROT  --   --  6.9  ALBUMIN  --   --  3.5   Recent Labs    07/27/17 05/01/18 07/09/18 1949  WBC 5.2 5.1 4.4  HGB 11.3* 10.6* 11.7*  HCT 33* 31* 36.7  MCV  --   --  93.1  PLT 206 201 213   Recent Labs    07/27/17 05/01/18  CHOL 160 145  LDLCALC 86 78  TRIG 79 96   No results found for: MICROALBUR Lab Results  Component Value Date   TSH 0.30 (A) 05/01/2018   Lab Results  Component Value Date   HGBA1C 5.7 05/01/2018   Lab Results  Component Value Date   CHOL 145 05/01/2018   HDL 48 05/01/2018   LDLCALC 78 05/01/2018   TRIG 96 05/01/2018   CHOLHDL 3.2 06/22/2013    Significant Diagnostic Results in last 30 days:  No results found.  Assessment and Plan  Suicidal ideation/suicide attempt- I checked the broken clock and the plastic shards from it and they were quite sharp.  The patient could have inflicted some damage.  Despite her looking calm on my examination she did go to a great deal of trouble to get the clock off the wall- if she had fallen she could have harmed herself-break the clock and then try to harm herself with the clock-therefore she will need to Afiya seen by behavioral health for clearance.    Time spent greater than 35 minutes;> 50% of time with patient was spent reviewing records, labs, tests and studies, counseling and developing plan of care  Merrilee Seashore, MD

## 2018-07-21 ENCOUNTER — Other Ambulatory Visit: Payer: Self-pay

## 2018-07-21 ENCOUNTER — Emergency Department (HOSPITAL_COMMUNITY): Payer: Medicare Other

## 2018-07-21 ENCOUNTER — Encounter (HOSPITAL_COMMUNITY): Payer: Self-pay | Admitting: Emergency Medicine

## 2018-07-21 ENCOUNTER — Emergency Department (HOSPITAL_COMMUNITY)
Admission: EM | Admit: 2018-07-21 | Discharge: 2018-07-21 | Disposition: A | Discharge status: Skilled Nursing Facility | Payer: Medicare Other | Attending: Emergency Medicine | Admitting: Emergency Medicine

## 2018-07-21 DIAGNOSIS — Z743 Need for continuous supervision: Secondary | ICD-10-CM | POA: Diagnosis not present

## 2018-07-21 DIAGNOSIS — R45851 Suicidal ideations: Secondary | ICD-10-CM | POA: Insufficient documentation

## 2018-07-21 DIAGNOSIS — E1122 Type 2 diabetes mellitus with diabetic chronic kidney disease: Secondary | ICD-10-CM | POA: Insufficient documentation

## 2018-07-21 DIAGNOSIS — Z87891 Personal history of nicotine dependence: Secondary | ICD-10-CM | POA: Diagnosis not present

## 2018-07-21 DIAGNOSIS — Z7984 Long term (current) use of oral hypoglycemic drugs: Secondary | ICD-10-CM | POA: Insufficient documentation

## 2018-07-21 DIAGNOSIS — I130 Hypertensive heart and chronic kidney disease with heart failure and stage 1 through stage 4 chronic kidney disease, or unspecified chronic kidney disease: Secondary | ICD-10-CM | POA: Diagnosis not present

## 2018-07-21 DIAGNOSIS — I5032 Chronic diastolic (congestive) heart failure: Secondary | ICD-10-CM | POA: Insufficient documentation

## 2018-07-21 DIAGNOSIS — N182 Chronic kidney disease, stage 2 (mild): Secondary | ICD-10-CM | POA: Diagnosis not present

## 2018-07-21 DIAGNOSIS — I700 Atherosclerosis of aorta: Secondary | ICD-10-CM | POA: Diagnosis not present

## 2018-07-21 DIAGNOSIS — Z046 Encounter for general psychiatric examination, requested by authority: Secondary | ICD-10-CM | POA: Diagnosis not present

## 2018-07-21 DIAGNOSIS — Z7982 Long term (current) use of aspirin: Secondary | ICD-10-CM | POA: Diagnosis not present

## 2018-07-21 DIAGNOSIS — F329 Major depressive disorder, single episode, unspecified: Secondary | ICD-10-CM | POA: Insufficient documentation

## 2018-07-21 DIAGNOSIS — Z79899 Other long term (current) drug therapy: Secondary | ICD-10-CM | POA: Insufficient documentation

## 2018-07-21 DIAGNOSIS — R279 Unspecified lack of coordination: Secondary | ICD-10-CM | POA: Diagnosis not present

## 2018-07-21 DIAGNOSIS — F29 Unspecified psychosis not due to a substance or known physiological condition: Secondary | ICD-10-CM | POA: Diagnosis not present

## 2018-07-21 DIAGNOSIS — R410 Disorientation, unspecified: Secondary | ICD-10-CM | POA: Diagnosis not present

## 2018-07-21 LAB — URINALYSIS, ROUTINE W REFLEX MICROSCOPIC
BILIRUBIN URINE: NEGATIVE
Glucose, UA: NEGATIVE mg/dL
Ketones, ur: NEGATIVE mg/dL
NITRITE: POSITIVE — AB
Protein, ur: NEGATIVE mg/dL
Specific Gravity, Urine: 1.011 (ref 1.005–1.030)
pH: 6 (ref 5.0–8.0)

## 2018-07-21 LAB — COMPREHENSIVE METABOLIC PANEL
ALT: 13 U/L (ref 0–44)
AST: 20 U/L (ref 15–41)
Albumin: 4 g/dL (ref 3.5–5.0)
Alkaline Phosphatase: 67 U/L (ref 38–126)
Anion gap: 9 (ref 5–15)
BUN: 17 mg/dL (ref 8–23)
CALCIUM: 9.3 mg/dL (ref 8.9–10.3)
CO2: 28 mmol/L (ref 22–32)
Chloride: 106 mmol/L (ref 98–111)
Creatinine, Ser: 0.95 mg/dL (ref 0.44–1.00)
GFR calc Af Amer: >60 mL/min (ref >60)
GFR calc non Af Amer: 55 mL/min — ABNORMAL LOW (ref >60)
Glucose, Bld: 109 mg/dL — ABNORMAL HIGH (ref 70–99)
Potassium: 3.5 mmol/L (ref 3.5–5.1)
Sodium: 143 mmol/L (ref 135–145)
Total Bilirubin: 0.7 mg/dL (ref 0.3–1.2)
Total Protein: 7.6 g/dL (ref 6.5–8.1)

## 2018-07-21 LAB — CBC
HCT: 38.2 % (ref 36.0–46.0)
Hemoglobin: 12 g/dL (ref 12.0–15.0)
MCH: 29.7 pg (ref 26.0–34.0)
MCHC: 31.4 g/dL (ref 30.0–36.0)
MCV: 94.6 fL (ref 80.0–100.0)
PLATELETS: 192 10*3/uL (ref 150–400)
RBC: 4.04 MIL/uL (ref 3.87–5.11)
RDW: 12.7 % (ref 11.5–15.5)
WBC: 4.2 10*3/uL (ref 4.0–10.5)
nRBC: 0 % (ref 0.0–0.2)

## 2018-07-21 LAB — ETHANOL: Alcohol, Ethyl (B): <10 mg/dL (ref <10)

## 2018-07-21 LAB — RAPID URINE DRUG SCREEN, HOSP PERFORMED
Amphetamines: NOT DETECTED
Barbiturates: NOT DETECTED
Benzodiazepines: NOT DETECTED
Cocaine: NOT DETECTED
Opiates: NOT DETECTED
Tetrahydrocannabinol: NOT DETECTED

## 2018-07-21 NOTE — ED Notes (Signed)
Bed: [facility] Expected date:  Expected time:  Means of arrival:  Comments: 85 yo SI

## 2018-07-21 NOTE — ED Notes (Signed)
Patient transported to X-ray 

## 2018-07-21 NOTE — ED Triage Notes (Signed)
Per [facility] pt from [facility] for making comments of "I want to die, I want to die".  Pt speaks mainly [place] Pt's family doesn't come see her much at nursing home per facility staff.  Pt was here recently after trying to cut her arm with broken pieces of a clock.

## 2018-07-21 NOTE — ED Notes (Signed)
Called son per patient request to let her speak to him.

## 2018-07-21 NOTE — ED Notes (Signed)
  ekg 12 lead taken, but will not print

## 2018-07-21 NOTE — ED Notes (Signed)
Attempted to use [employer] for interpretation with patient.  Pt states, "I see you" (and points to screen but will not converse with interpretor.

## 2018-07-21 NOTE — ED Notes (Signed)
Pt wanting to speak with son. Called son on portable phone and let patient talk to him.

## 2018-07-22 NOTE — ED Provider Notes (Signed)
[facility]-EMERGENCY DEPT Provider Note   CSN: [id number] Arrival date & time: 07/21/18  0905     History   Chief Complaint Chief Complaint  Patient presents with  . Suicidal    HPI Margaret [name] is a 85 y.o. female.  HPI Patient is a 85 year old female [place] speaker who presents to the emergency department from her nursing facility where it is reported that she yelled out several times that she would rather die.  She was recently seen and evaluated by the psychiatric team in mid January with a consult note from the psychiatrist on July 10, 2018 for similar complaints.  At that time she was felt stable to [facility] discharged home without additional psychiatric work-up or acute hospitalization.  At this time patient has no complaints of suicidal ideation to me.  She states she would like to go back to her nursing facility at [facility].  She is calm and cooperative here in the emergency department.  There is some frustration with the patient that her family does not come to visit as often as she would like.   Past Medical History:  Diagnosis Date  . Breast mass, right 12/13/2013  . Chronic diastolic CHF (congestive heart failure) (HCC) 07/19/2013  . Chronic diastolic heart failure (HCC)   . Chronic kidney disease, stage II (mild)   . Chronic renal disease, stage 2, mildly decreased glomerular filtration rate (GFR) between 60-89 mL/min/1.73 square meter 09/03/2014  . Closed T12 fracture (HCC) 07/16/2013  . Depression 07/18/2013  . Diabetes mellitus   . Diabetes mellitus due to underlying condition (HCC) 06/21/2013  . Diabetes mellitus without complication (HCC)   . Dysphagia, oropharyngeal phase   . GERD (gastroesophageal reflux disease) 12/17/2014  . Gout   . Hypertension   . Hypertensive heart disease with congestive heart failure (HCC) 06/21/2013  . Iron deficiency anemia 09/03/2014  . Iron deficiency anemia, unspecified   . Leukocytosis 07/18/2013  . Lumbago  10/23/2013  . Lung nodule < 6cm on CT 11/13/2016  . Osteopenia 07/16/2013  . Syncope 06/21/2013  . Type 2 diabetes, controlled, with neuropathy (HCC) 10/18/2013  . Unspecified constipation 10/18/2013  . Vitamin D deficiency 04/11/2016    Patient Active Problem List   Diagnosis Date Noted  . Hyperlipidemia associated with type 2 diabetes mellitus (HCC) 06/10/2018  . Lung nodule < 6cm on CT 11/13/2016  . Vitamin D deficiency 04/11/2016  . Hyperlipidemia 02/11/2016  . Pain 11/07/2015  . Wheezing 11/07/2015  . Edema 10/25/2015  . Conjunctivitis 07/09/2015  . UTI (urinary tract infection) 03/24/2015  . GERD (gastroesophageal reflux disease) 12/17/2014  . Hypokalemia 12/17/2014  . Depression 11/25/2014  . Iron deficiency anemia 09/03/2014  . Chronic renal disease, stage 2, mildly decreased glomerular filtration rate (GFR) between 60-89 mL/min/1.73 square meter 09/03/2014  . Pain in joint, lower leg 08/21/2014  . Cough 01/27/2014  . Dizziness 01/12/2014  . Contusion of unspecified site 01/12/2014  . Breast mass, right 12/13/2013  . Lumbago 10/23/2013  . Type 2 diabetes, controlled, with neuropathy (HCC) 10/18/2013  . 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 Unspecified constipation 10/18/2013  . Plantar fasciitis, left 07/29/2013  . Physical deconditioning 07/21/2013  . Diabetes mellitus, controlled (HCC) 07/19/2013  . Chronic diastolic CHF (congestive heart failure) (HCC) 07/19/2013  . HCAP (healthcare-associated pneumonia) 07/18/2013  . Chronic diastolic heart failure (HCC) 06/24/2013  . Syncope 06/21/2013  . Syncope and collapse 06/21/2013  . Leukocytosis 06/21/2013  .  Hypertensive heart disease with congestive heart failure (HCC) 06/21/2013  . Diabetes mellitus due to underlying condition (HCC) 06/21/2013    Past Surgical History:  Procedure Laterality Date  . NO PAST SURGERIES       OB History   No obstetric history on file.       Home Medications    Prior to Admission medications   Medication Sig Start Date End Date Taking? Authorizing Provider  aspirin 81 MG chewable tablet Chew 1 tablet (81 mg total) by mouth daily. 06/24/13  Yes Hollice EspyKrishnan, Sendil K, MD  bisacodyl (DULCOLAX) 10 MG suppository Place 10 mg rectally as needed for moderate constipation (constipation not relieved by milk of magnesia).   Yes [provider]  ferrous sulfate 325 (65 FE) MG tablet Take 325 mg by mouth daily with breakfast.    Yes [provider]  furosemide (LASIX) 40 MG tablet Take 40 mg by mouth every morning.  04/08/13  Yes [provider]  gabapentin (NEURONTIN) 300 MG capsule Take 1 capsule (300 mg total) by mouth at bedtime. 07/22/13  Yes Tat, Onalee Huaavid, MD  ipratropium-albuterol (DUONEB) 0.5-2.5 (3) MG/3ML SOLN Take 3 mLs by nebulization every 6 (six) hours as needed.   Yes [provider]  magnesium hydroxide (MILK OF MAGNESIA) 400 MG/5ML suspension Take 30 mLs by mouth as needed (if no bowel movement in 3 days 1x/24 hours.).    Yes [provider]  metFORMIN (GLUCOPHAGE-XR) 500 MG 24 hr tablet Take 500 mg by mouth every evening. For diabetes   Yes [provider]  NUTRITIONAL SUPPLEMENT LIQD Take 1 each by mouth every evening. "Magic Cup" - Take one cup with dinner as supplement   Yes [provider]  potassium chloride (K-DUR) 10 MEQ tablet Take 30 mEq by mouth daily.    Yes [provider]  Sodium Phosphates (RA SALINE ENEMA RE) Place 1 enema rectally as needed (constipation unrelieved by bisacodyl suppository 1x/24 hours.).   Yes [provider]  traMADol (ULTRAM) 50 MG tablet Take 50 mg by mouth 2 (two) times daily.   Yes [provider]  Vitamin D, Ergocalciferol, (DRISDOL) 50000 units CAPS capsule Take 50,000 Units by mouth every Wednesday.    Yes [provider]    Family History No family history on file.  Social  History Social History   Tobacco Use  . Smoking status: Former Games developermoker  . Smokeless tobacco: Never Used  Substance Use Topics  . Alcohol use: No  . Drug use: No     Allergies   Tequin [gatifloxacin]   Review of Systems Review of Systems  All other systems reviewed and are negative.    Physical Exam Updated Vital Signs BP (!) 160/92 (BP Location: Left Arm)   Pulse 92   Temp 98.5 F (36.9 C) (Oral)   Resp 18   LMP  (LMP Unknown)   SpO2 97%   Physical Exam Vitals signs and nursing note reviewed.  Constitutional:      General: She is not in acute distress.    Appearance: She is well-developed.  HENT:     Head: Normocephalic and atraumatic.  Neck:     Musculoskeletal: Normal range of motion.  Cardiovascular:     Rate and Rhythm: Normal rate and regular rhythm.     Heart sounds: Normal heart sounds.  Pulmonary:     Effort: Pulmonary effort is normal.     Breath sounds: Normal breath sounds.  Abdominal:     General:  There is no distension.     Palpations: Abdomen is soft.     Tenderness: There is no abdominal tenderness.  Musculoskeletal: Normal range of motion.  Skin:    General: Skin is warm and dry.  Neurological:     General: No focal deficit present.     Mental Status: She is alert. Mental status is at baseline.  Psychiatric:        Mood and Affect: Mood normal.        Thought Content: Thought content normal.        Judgment: Judgment normal.      ED Treatments / Results  Labs (all labs ordered are listed, but only abnormal results are displayed) Labs Reviewed  COMPREHENSIVE METABOLIC PANEL - Abnormal; Notable for the following components:      Result Value   Glucose, Bld 109 (*)    GFR calc non Af Amer 55 (*)    All other components within normal limits  URINALYSIS, ROUTINE W REFLEX MICROSCOPIC - Abnormal; Notable for the following components:   APPearance HAZY (*)    Hgb urine dipstick MODERATE (*)    Nitrite POSITIVE (*)    Leukocytes, UA  MODERATE (*)    Bacteria, UA MANY (*)    All other components within normal limits  CBC  ETHANOL  RAPID URINE DRUG SCREEN, HOSP PERFORMED    EKG None  Radiology Dg Chest 2 View  Result Date: 07/21/2018 CLINICAL DATA:  Geriatric psych evaluation EXAM: CHEST - 2 VIEW COMPARISON:  07/18/2013 FINDINGS: Heart size is normal. There is aortic atherosclerosis. No pleural effusion or edema. No airspace opacities identified. Scoliosis deformity of the thoracic spine is convex towards the right. IMPRESSION: 1. No acute cardiopulmonary abnormalities. Electronically Signed   By: Signa Kell M.D.   On: 07/21/2018 10:48    Procedures Procedures (including critical care time)  Medications Ordered in ED Medications - No data to display   Initial Impression / Assessment and Plan / ED Course  I have reviewed the triage vital signs and the nursing notes.  Pertinent labs & imaging results that were available during my care of the patient were reviewed by me and considered in my medical decision making (see chart for details).     Patient felt to [place] stable to discharge back to nursing facility from a psychiatric standpoint.  No urinary symptoms at this time.  Urine culture sent.  No vomiting.  Tolerating oral fluids.  Discharged home in good condition.  Close primary care follow-up.  No identifiable suicidal ideation.  Patients presenting with no risk factors but with morbid ruminations; may Siddhi classified as minimal risk based on the severity of the depressive symptoms  Final Clinical Impressions(s) / ED Diagnoses   Final diagnoses:  Suicidal ideation    ED Discharge Orders    None       Azalia Bilis, MD 07/22/18 [phone]

## 2018-08-01 ENCOUNTER — Encounter: Payer: Self-pay | Admitting: Internal Medicine

## 2018-08-01 ENCOUNTER — Non-Acute Institutional Stay (SKILLED_NURSING_FACILITY): Payer: Medicare Other | Admitting: Internal Medicine

## 2018-08-01 DIAGNOSIS — N390 Urinary tract infection, site not specified: Secondary | ICD-10-CM | POA: Diagnosis not present

## 2018-08-01 DIAGNOSIS — I5032 Chronic diastolic (congestive) heart failure: Secondary | ICD-10-CM | POA: Diagnosis not present

## 2018-08-01 DIAGNOSIS — Z79899 Other long term (current) drug therapy: Secondary | ICD-10-CM | POA: Diagnosis not present

## 2018-08-01 DIAGNOSIS — D508 Other iron deficiency anemias: Secondary | ICD-10-CM

## 2018-08-01 DIAGNOSIS — R319 Hematuria, unspecified: Secondary | ICD-10-CM | POA: Diagnosis not present

## 2018-08-01 DIAGNOSIS — N182 Chronic kidney disease, stage 2 (mild): Secondary | ICD-10-CM | POA: Diagnosis not present

## 2018-08-01 NOTE — Progress Notes (Signed)
Location:  [facility] Room Number: 201D Place of Service:  SNF [phone])  Randon Goldsmith. Lyn Hollingshead, MD  [name], MD as PCP - General (Internal Medicine)  Extended Emergency Contact Information Primary Emergency Contact: Dumler,[name] Address: [address]          [place], [place] [address] [name] of [place] Home Phone: [phone] Mobile Phone: [phone] Relation: Sister Secondary Emergency Contact: [name],[name] Address: [address]          [place], [place] [address] [name] of [place] Home Phone: [phone] Relation: Granddaughter    Allergies: Tequin [gatifloxacin]  Chief Complaint  Patient presents with  . Medical Management of Chronic Issues    Routine visit    HPI: Patient is 85 y.o. female who i deficiency anemia s being seen for routine issues of chronic congestive heart failure, chronic kidney disease stage III now and iron deficiency anemia.  Past Medical History:  Diagnosis Date  . Breast mass, right 12/13/2013  . Chronic diastolic CHF (congestive heart failure) (HCC) 07/19/2013  . Chronic diastolic heart failure (HCC)   . Chronic kidney disease, stage II (mild)   . Chronic renal disease, stage 2, mildly decreased glomerular filtration rate (GFR) between 60-89 mL/min/1.73 square meter 09/03/2014  . Closed T12 fracture (HCC) 07/16/2013  . Depression 07/18/2013  . Diabetes mellitus   . Diabetes mellitus due to underlying condition (HCC) 06/21/2013  . Diabetes mellitus without complication (HCC)   . Dysphagia, oropharyngeal phase   . GERD (gastroesophageal reflux disease) 12/17/2014  . Gout   . Hypertension   . Hypertensive heart disease with congestive heart failure (HCC) 06/21/2013  . Iron deficiency anemia 09/03/2014  . Iron deficiency anemia, unspecified   . Leukocytosis 07/18/2013  . Lumbago 10/23/2013  . Lung nodule < 6cm on CT 11/13/2016  . Osteopenia 07/16/2013  . Syncope  06/21/2013  . Type 2 diabetes, controlled, with neuropathy (HCC) 10/18/2013  . Unspecified constipation 10/18/2013  . Vitamin D deficiency 04/11/2016    Past Surgical History:  Procedure Laterality Date  . NO PAST SURGERIES      Allergies as of 08/01/2018      Reactions   Tequin [gatifloxacin] Other (See Comments)   Unknown rxn per [facility]      Medication List       Accurate as of August 01, 2018 11:59 PM. Always use your most recent med list.        aspirin 81 MG chewable tablet Chew 1 tablet (81 mg total) by mouth daily.   bisacodyl 10 MG suppository Commonly known as:  DULCOLAX Place 10 mg rectally as needed for moderate constipation (constipation not relieved by milk of magnesia).   ferrous sulfate 325 (65 FE) MG tablet Take 325 mg by mouth daily with breakfast.   furosemide 40 MG tablet Commonly known as:  LASIX Take 40 mg by mouth every morning.   gabapentin 300 MG capsule Commonly known as:  NEURONTIN Take 1 capsule (300 mg total) by mouth at bedtime.   ipratropium-albuterol 0.5-2.5 (3) MG/3ML Soln Commonly known as:  DUONEB Take 3 mLs by nebulization every 6 (six) hours as needed.   magnesium hydroxide 400 MG/5ML suspension Commonly known as:  MILK OF MAGNESIA Take 30 mLs by mouth as needed (if no bowel movement in 3 days 1x/24 hours.).   metFORMIN 500 MG 24 hr tablet Commonly known as:  GLUCOPHAGE-XR Take 500 mg by mouth every evening. For diabetes  NUTRITIONAL SUPPLEMENT Liqd Take 1 each by mouth every evening. "Magic Cup" - Take one cup with dinner as supplement   potassium chloride 10 MEQ tablet Commonly known as:  K-DUR Take 30 mEq by mouth daily.   RA SALINE ENEMA RE Place 1 enema rectally as needed (constipation unrelieved by bisacodyl suppository 1x/24 hours.).   traMADol 50 MG tablet Commonly known as:  ULTRAM Take 50 mg by mouth 2 (two) times daily.   Vitamin D (Ergocalciferol) 1.25 MG (50000 UT) Caps capsule Commonly known as:   DRISDOL Take 50,000 Units by mouth every Wednesday.       No orders of the defined types were placed in this encounter.   Immunization History  Administered Date(s) Administered  . Influenza-Unspecified 03/23/2015, 04/06/2016, 04/06/2017  . PPD Test 08/12/2013  . Pneumococcal Polysaccharide-23 06/23/2013    Social History   Tobacco Use  . Smoking status: Former Games developermoker  . Smokeless tobacco: Never Used  Substance Use Topics  . Alcohol use: No    Review of Systems  DATA OBTAINED: from nurse GENERAL:  no fevers, fatigue, appetite changes SKIN: No itching, rash HEENT: No complaint RESPIRATORY: No cough, wheezing, SOB CARDIAC: No chest pain, palpitations, lower extremity edema  GI: No abdominal pain, No N/V/D or constipation, No heartburn or reflux  GU: + dysuria, frequency  MUSCULOSKELETAL: No unrelieved bone/joint pain NEUROLOGIC: No headache, dizziness  PSYCHIATRIC: No overt anxiety or sadness  Vitals:   08/01/18 1015  BP: 130/78  Pulse: 90  Resp: 18  Temp: 97.8 F (36.6 C)   Body mass index is 20.3 kg/m. Physical Exam  GENERAL APPEARANCE: Alert, minimally conversant, smiles and waves on a good day no acute distress  SKIN: No diaphoresis rash HEENT: Unremarkable RESPIRATORY: Breathing is even, unlabored. Lung sounds are clear   CARDIOVASCULAR: Heart RRR no murmurs, rubs or gallops. No peripheral edema  GASTROINTESTINAL: Abdomen is soft, non-tender, not distended w/ normal bowel sounds.  GENITOURINARY: Bladder non tender, not distended  MUSCULOSKELETAL: No abnormal joints or musculature NEUROLOGIC: Cranial nerves 2-12 grossly intact. Moves all extremities PSYCHIATRIC: Mood and affect appropriate to situation with some dementia, no behavioral issues  Patient Active Problem List   Diagnosis Date Noted  . Hyperlipidemia associated with type 2 diabetes mellitus (HCC) 06/10/2018  . Lung nodule < 6cm on CT 11/13/2016  . Vitamin D deficiency 04/11/2016  .  Hyperlipidemia 02/11/2016  . Pain 11/07/2015  . Wheezing 11/07/2015  . Edema 10/25/2015  . Conjunctivitis 07/09/2015  . UTI (urinary tract infection) 03/24/2015  . GERD (gastroesophageal reflux disease) 12/17/2014  . Hypokalemia 12/17/2014  . Depression 11/25/2014  . Iron deficiency anemia 09/03/2014  . Chronic renal disease, stage 2, mildly decreased glomerular filtration rate (GFR) between 60-89 mL/min/1.73 square meter 09/03/2014  . Pain in joint, lower leg 08/21/2014  . Cough 01/27/2014  . Dizziness 01/12/2014  . Contusion of unspecified site 01/12/2014  . Breast mass, right 12/13/2013  . Lumbago 10/23/2013  . Type 2 diabetes, controlled, with neuropathy (HCC) 10/18/2013  . 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 Unspecified constipation 10/18/2013  . Plantar fasciitis, left 07/29/2013  . Physical deconditioning 07/21/2013  . Diabetes mellitus, controlled (HCC) 07/19/2013  . Chronic diastolic CHF (congestive heart failure) (HCC) 07/19/2013  . HCAP (healthcare-associated pneumonia) 07/18/2013  . Chronic diastolic heart failure (HCC) 06/24/2013  . Syncope 06/21/2013  . Syncope and collapse 06/21/2013  . Leukocytosis 06/21/2013  . Hypertensive heart disease with congestive heart failure (HCC)  06/21/2013  . Diabetes mellitus due to underlying condition (HCC) 06/21/2013    CMP     Component Value Date/Time   NA 143 07/21/2018 1105   NA 141 05/14/2018   K 3.5 07/21/2018 1105   CL 106 07/21/2018 1105   CO2 28 07/21/2018 1105   GLUCOSE 109 (H) 07/21/2018 1105   BUN 17 07/21/2018 1105   BUN 22 (A) 05/14/2018   CREATININE 0.95 07/21/2018 1105   CALCIUM 9.3 07/21/2018 1105   PROT 7.6 07/21/2018 1105   ALBUMIN 4.0 07/21/2018 1105   AST 20 07/21/2018 1105   ALT 13 07/21/2018 1105   ALKPHOS 67 07/21/2018 1105   BILITOT 0.7 07/21/2018 1105   GFRNONAA 55 (L) 07/21/2018 1105   GFRAA >60 07/21/2018 1105   Recent Labs    05/14/18  07/09/18 1949 07/21/18 1105  NA 141 142 143  K 4.6 3.6 3.5  CL  --  102 106  CO2  --  29 28  GLUCOSE  --  120* 109*  BUN 22* 30* 17  CREATININE 1.1 1.47* 0.95  CALCIUM  --  8.8* 9.3   Recent Labs    05/01/18 07/09/18 1949 07/21/18 1105  AST 19 18 20   ALT 16 11 13   ALKPHOS 81 63 67  BILITOT  --  0.5 0.7  PROT  --  6.9 7.6  ALBUMIN  --  3.5 4.0   Recent Labs    05/01/18 07/09/18 1949 07/21/18 1105  WBC 5.1 4.4 4.2  HGB 10.6* 11.7* 12.0  HCT 31* 36.7 38.2  MCV  --  93.1 94.6  PLT 201 213 192   Recent Labs    05/01/18  CHOL 145  LDLCALC 78  TRIG 96   No results found for: [facility] Lab Results  Component Value Date   TSH 0.30 (A) 05/01/2018   Lab Results  Component Value Date   HGBA1C 5.7 05/01/2018   Lab Results  Component Value Date   CHOL 145 05/01/2018   HDL 48 05/01/2018   LDLCALC 78 05/01/2018   TRIG 96 05/01/2018   CHOLHDL 3.2 06/22/2013    Significant Diagnostic Results in last 30 days:  Dg Chest 2 View  Result Date: 07/21/2018 CLINICAL DATA:  Geriatric psych evaluation EXAM: CHEST - 2 VIEW COMPARISON:  07/18/2013 FINDINGS: Heart size is normal. There is aortic atherosclerosis. No pleural effusion or edema. No airspace opacities identified. Scoliosis deformity of the thoracic spine is convex towards the right. IMPRESSION: 1. No acute cardiopulmonary abnormalities. Electronically Signed   By: Signa Kell M.D.   On: 07/21/2018 10:48    Assessment and Plan  Chronic diastolic heart failure Continues without exacerbation.  Continue Cozaar 25 mg daily and Lasix 40 mg daily  Chronic renal disease, stage 2, mildly decreased glomerular filtration rate (GFR) between 60-89 mL/min/1.73 square meter GFR is 55, which places her in chronic kidney disease stage III just barely; will monitor at intervals  Iron deficiency anemia Recent hemoglobin 12.0 which is similar to prior; continue iron 325 mg daily    Anne D. Lyn Hollingshead, MD

## 2018-08-03 ENCOUNTER — Non-Acute Institutional Stay (SKILLED_NURSING_FACILITY): Payer: Medicare Other | Admitting: Internal Medicine

## 2018-08-03 DIAGNOSIS — Z1612 Extended spectrum beta lactamase (ESBL) resistance: Secondary | ICD-10-CM

## 2018-08-03 DIAGNOSIS — N390 Urinary tract infection, site not specified: Secondary | ICD-10-CM

## 2018-08-03 DIAGNOSIS — N3090 Cystitis, unspecified without hematuria: Secondary | ICD-10-CM | POA: Diagnosis not present

## 2018-08-03 DIAGNOSIS — B9629 Other Escherichia coli [E. coli] as the cause of diseases classified elsewhere: Secondary | ICD-10-CM

## 2018-08-04 ENCOUNTER — Encounter: Payer: Self-pay | Admitting: Internal Medicine

## 2018-08-04 NOTE — Assessment & Plan Note (Signed)
Continues without exacerbation.  Continue Cozaar 25 mg daily and Lasix 40 mg daily

## 2018-08-04 NOTE — Assessment & Plan Note (Signed)
Recent hemoglobin 12.0 which is similar to prior; continue iron 325 mg daily

## 2018-08-04 NOTE — Assessment & Plan Note (Signed)
GFR is 55, which places her in chronic kidney disease stage III just barely; will monitor at intervals

## 2018-08-05 ENCOUNTER — Encounter: Payer: Self-pay | Admitting: Internal Medicine

## 2018-08-05 NOTE — Progress Notes (Addendum)
[facility]   Place of Service:   SNF  Lyn HollingsheadAlexander, Randon GoldsmithAnne D, MD  Patient Care Team: [name], Lorane Cousar D, MD as PCP - General (Internal Medicine)  Extended Emergency Contact Information Primary Emergency Contact: Eastburn,[name] Address: [address]          [place], [place] [address] [place] Home Phone: [phone] Mobile Phone: [phone] Relation: Sister Secondary Emergency Contact: [name],[name] Address: [address]          [place], [place] [address] [place] Home Phone: [phone] Relation: Granddaughter    Allergies: Tequin [gatifloxacin]  Chief Complaint  Patient presents with  . Acute Visit    HPI: Patient is 85 y.o. female who is being seen today for UTI.  Several days ago patient was complaining of dysuria.  Today antibiotic sensitivities came out patient has grown out greater than 100,000 ESBL E. coli.  Patient has not had any fever chills nausea vomiting or any other systemic symptom.  Past Medical History:  Diagnosis Date  . Breast mass, right 12/13/2013  . Chronic diastolic CHF (congestive heart failure) (HCC) 07/19/2013  . Chronic diastolic heart failure (HCC)   . Chronic kidney disease, stage II (mild)   . Chronic renal disease, stage 2, mildly decreased glomerular filtration rate (GFR) between 60-89 mL/min/1.73 square meter 09/03/2014  . Closed T12 fracture (HCC) 07/16/2013  . Depression 07/18/2013  . Diabetes mellitus   . Diabetes mellitus due to underlying condition (HCC) 06/21/2013  . Diabetes mellitus without complication (HCC)   . Dysphagia, oropharyngeal phase   . GERD (gastroesophageal reflux disease) 12/17/2014  . Gout   . Hypertension   . Hypertensive heart disease with congestive heart failure (HCC) 06/21/2013  . Iron deficiency anemia 09/03/2014  . Iron deficiency anemia, unspecified   . Leukocytosis 07/18/2013  . Lumbago 10/23/2013  . Lung nodule < 6cm on CT 11/13/2016  . Osteopenia 07/16/2013    . Syncope 06/21/2013  . Type 2 diabetes, controlled, with neuropathy (HCC) 10/18/2013  . Unspecified constipation 10/18/2013  . Vitamin D deficiency 04/11/2016    Past Surgical History:  Procedure Laterality Date  . NO PAST SURGERIES      Allergies as of 08/03/2018      Reactions   Tequin [gatifloxacin] Other (See Comments)   [facility]      Medication List       Accurate as of August 03, 2018 11:59 PM. Always use your most recent med list.        aspirin 81 MG chewable tablet Chew 1 tablet (81 mg total) by mouth daily.   bisacodyl 10 MG suppository Commonly known as:  DULCOLAX Place 10 mg rectally as needed for moderate constipation (constipation not relieved by milk of magnesia).   ferrous sulfate 325 (65 FE) MG tablet Take 325 mg by mouth daily with breakfast.   furosemide 40 MG tablet Commonly known as:  LASIX Take 40 mg by mouth every morning.   gabapentin 300 MG capsule Commonly known as:  NEURONTIN Take 1 capsule (300 mg total) by mouth at bedtime.   ipratropium-albuterol 0.5-2.5 (3) MG/3ML Soln Commonly known as:  DUONEB Take 3 mLs by nebulization every 6 (six) hours as needed.   magnesium hydroxide 400 MG/5ML suspension Commonly known as:  MILK OF MAGNESIA Take 30 mLs by mouth as needed (if no bowel movement in 3 days 1x/24 hours.).   metFORMIN 500 MG 24 hr tablet Commonly known as:  GLUCOPHAGE-XR  Take 500 mg by mouth every evening. For diabetes   NUTRITIONAL SUPPLEMENT Liqd Take 1 each by mouth every evening. "Magic Cup" - Take one cup with dinner as supplement   potassium chloride 10 MEQ tablet Commonly known as:  K-DUR Take 30 mEq by mouth daily.   RA SALINE ENEMA RE Place 1 enema rectally as needed (constipation unrelieved by bisacodyl suppository 1x/24 hours.).   traMADol 50 MG tablet Commonly known as:  ULTRAM Take 50 mg by mouth 2 (two) times daily.   Vitamin D (Ergocalciferol) 1.25 MG (50000 UT) Caps capsule Commonly known  as:  DRISDOL Take 50,000 Units by mouth every Wednesday.       No orders of the defined types were placed in this encounter.   Immunization History  Administered Date(s) Administered  . Influenza-Unspecified 03/23/2015, 04/06/2016, 04/06/2017  . PPD Test 08/12/2013  . Pneumococcal Polysaccharide-23 06/23/2013    Social History   Tobacco Use  . Smoking status: Former Games developer  . Smokeless tobacco: Never Used  Substance Use Topics  . Alcohol use: No    Review of Systems  DATA OBTAINED: from nurse GENERAL:  no fevers, fatigue, appetite changes SKIN: No itching, rash HEENT: No complaint RESPIRATORY: No cough, wheezing, SOB CARDIAC: No chest pain, palpitations, lower extremity edema  GI: No abdominal pain, No N/V/D or constipation, No heartburn or reflux  GU: No dysuria, frequency or urgency, or incontinence  MUSCULOSKELETAL: No unrelieved bone/joint pain NEUROLOGIC: No headache, dizziness  PSYCHIATRIC: No overt anxiety or sadness  Vitals:   08/05/18 1039  BP: 130/78  Pulse: 90  Resp: 18  Temp: 98.2 F (36.8 C)   Body mass index is 20.3 kg/m. Physical Exam  GENERAL APPEARANCE: Alert,  No acute distress  SKIN: No diaphoresis rash HEENT: Unremarkable RESPIRATORY: Breathing is even, unlabored. Lung sounds are clear   CARDIOVASCULAR: Heart RRR no murmurs, rubs or gallops. No peripheral edema  GASTROINTESTINAL: Abdomen is soft, non-tender, not distended w/ normal bowel sounds.  GENITOURINARY: Bladder non tender, not distended  MUSCULOSKELETAL: No abnormal joints or musculature NEUROLOGIC: Cranial nerves 2-12 grossly intact. Moves all extremities PSYCHIATRIC: Mood and affect appropriate to situation, no behavioral issues  Patient Active Problem List   Diagnosis Date Noted  . Hyperlipidemia associated with type 2 diabetes mellitus (HCC) 06/10/2018  . Lung nodule < 6cm on CT 11/13/2016  . Vitamin D deficiency 04/11/2016  . Hyperlipidemia 02/11/2016  . Pain  11/07/2015  . Wheezing 11/07/2015  . Edema 10/25/2015  . Conjunctivitis 07/09/2015  . UTI (urinary tract infection) 03/24/2015  . GERD (gastroesophageal reflux disease) 12/17/2014  . Hypokalemia 12/17/2014  . Depression 11/25/2014  . Iron deficiency anemia 09/03/2014  . Chronic renal disease, stage 2, mildly decreased glomerular filtration rate (GFR) between 60-89 mL/min/1.73 square meter 09/03/2014  . Pain in joint, lower leg 08/21/2014  . Cough 01/27/2014  . Dizziness 01/12/2014  . Contusion of unspecified site 01/12/2014  . Breast mass, right 12/13/2013  . Lumbago 10/23/2013  . Type 2 diabetes, controlled, with neuropathy (HCC) 10/18/2013  . 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 Unspecified constipation 10/18/2013  . Plantar fasciitis, left 07/29/2013  . Physical deconditioning 07/21/2013  . Diabetes mellitus, controlled (HCC) 07/19/2013  . Chronic diastolic CHF (congestive heart failure) (HCC) 07/19/2013  . HCAP (healthcare-associated pneumonia) 07/18/2013  . Chronic diastolic heart failure (HCC) 06/24/2013  . Syncope 06/21/2013  . Syncope and collapse 06/21/2013  . Leukocytosis 06/21/2013  . Hypertensive heart disease with  congestive heart failure (HCC) 06/21/2013  . Diabetes mellitus due to underlying condition (HCC) 06/21/2013    CMP     Component Value Date/Time   NA 143 07/21/2018 1105   NA 141 05/14/2018   K 3.5 07/21/2018 1105   CL 106 07/21/2018 1105   CO2 28 07/21/2018 1105   GLUCOSE 109 (H) 07/21/2018 1105   BUN 17 07/21/2018 1105   BUN 22 (A) 05/14/2018   CREATININE 0.95 07/21/2018 1105   CALCIUM 9.3 07/21/2018 1105   PROT 7.6 07/21/2018 1105   ALBUMIN 4.0 07/21/2018 1105   AST 20 07/21/2018 1105   ALT 13 07/21/2018 1105   ALKPHOS 67 07/21/2018 1105   BILITOT 0.7 07/21/2018 1105   GFRNONAA 55 (L) 07/21/2018 1105   GFRAA >60 07/21/2018 1105   Recent Labs    05/14/18 07/09/18 1949 07/21/18 1105  NA 141  142 143  K 4.6 3.6 3.5  CL  --  102 106  CO2  --  29 28  GLUCOSE  --  120* 109*  BUN 22* 30* 17  CREATININE 1.1 1.47* 0.95  CALCIUM  --  8.8* 9.3   Recent Labs    05/01/18 07/09/18 1949 07/21/18 1105  AST 19 18 20   ALT 16 11 13   ALKPHOS 81 63 67  BILITOT  --  0.5 0.7  PROT  --  6.9 7.6  ALBUMIN  --  3.5 4.0   Recent Labs    05/01/18 07/09/18 1949 07/21/18 1105  WBC 5.1 4.4 4.2  HGB 10.6* 11.7* 12.0  HCT 31* 36.7 38.2  MCV  --  93.1 94.6  PLT 201 213 192   Recent Labs    05/01/18  CHOL 145  LDLCALC 78  TRIG 96   No results found for: [facility] Lab Results  Component Value Date   TSH 0.30 (A) 05/01/2018   Lab Results  Component Value Date   HGBA1C 5.7 05/01/2018   Lab Results  Component Value Date   CHOL 145 05/01/2018   HDL 48 05/01/2018   LDLCALC 78 05/01/2018   TRIG 96 05/01/2018   CHOLHDL 3.2 06/22/2013    Significant Diagnostic Results in last 30 days:  Dg Chest 2 View  Result Date: 07/21/2018 CLINICAL DATA:  Geriatric psych evaluation EXAM: CHEST - 2 VIEW COMPARISON:  07/18/2013 FINDINGS: Heart size is normal. There is aortic atherosclerosis. No pleural effusion or edema. No airspace opacities identified. Scoliosis deformity of the thoracic spine is convex towards the right. IMPRESSION: 1. No acute cardiopulmonary abnormalities. Electronically Signed   By: Signa Kellaylor  Stroud M.D.   On: 07/21/2018 10:48    Assessment and Plan  ESBL E. coli UTI- the only p.o. antibiotic available for patient to take his Macrobid therefore we will start with Macrobid ER 100 mg every 12 for 10 days.     Merrilee SeashoreAnne Gregary Blackard, MD

## 2018-08-30 ENCOUNTER — Non-Acute Institutional Stay (SKILLED_NURSING_FACILITY): Payer: Medicare Other | Admitting: Internal Medicine

## 2018-08-30 ENCOUNTER — Encounter: Payer: Self-pay | Admitting: Internal Medicine

## 2018-08-30 DIAGNOSIS — I5032 Chronic diastolic (congestive) heart failure: Secondary | ICD-10-CM | POA: Diagnosis not present

## 2018-08-30 DIAGNOSIS — E1142 Type 2 diabetes mellitus with diabetic polyneuropathy: Secondary | ICD-10-CM | POA: Diagnosis not present

## 2018-08-30 NOTE — Progress Notes (Signed)
Location:  [facility] Room Number: 201D Place of Service:  SNF [phone])  Margaret [name]. Margaret Hollingshead, MD  [name], MD as PCP - General (Internal Medicine)  Extended Emergency Contact Information Primary Emergency Contact: Pickar,[name] Address: [address]          [place], [place] [address] [name] Home Phone: [phone] Mobile Phone: [phone] Relation: Sister Secondary Emergency Contact: [name],[name] Address: [address]          [place], [place] [address] [name] Home Phone: [phone] Relation: Granddaughter    Allergies: Tequin [gatifloxacin]  Chief Complaint  Patient presents with  . Medical Management of Chronic Issues    Routine visit    HPI: Patient is 85 y.o. female who is being seen for routine issues of chronic diastolic congestive heart failure, diabetes mellitus type 2 and neuropathy secondary to diabetes.  Past Medical History:  Diagnosis Date  . Breast mass, right 12/13/2013  . Chronic diastolic CHF (congestive heart failure) (HCC) 07/19/2013  . Chronic diastolic heart failure (HCC)   . Chronic kidney disease, stage II (mild)   . Chronic renal disease, stage 2, mildly decreased glomerular filtration rate (GFR) between 60-89 mL/min/1.73 square meter 09/03/2014  . Closed T12 fracture (HCC) 07/16/2013  . Depression 07/18/2013  . Diabetes mellitus   . Diabetes mellitus due to underlying condition (HCC) 06/21/2013  . Diabetes mellitus without complication (HCC)   . Dysphagia, oropharyngeal phase   . GERD (gastroesophageal reflux disease) 12/17/2014  . Gout   . Hypertension   . Hypertensive heart disease with congestive heart failure (HCC) 06/21/2013  . Iron deficiency anemia 09/03/2014  . Iron deficiency anemia, unspecified   . Leukocytosis 07/18/2013  . Lumbago 10/23/2013  . Lung nodule < 6cm on CT 11/13/2016  . Osteopenia 07/16/2013  . Syncope 06/21/2013  . Type  2 diabetes, controlled, with neuropathy (HCC) 10/18/2013  . Unspecified constipation 10/18/2013  . Vitamin D deficiency 04/11/2016    Past Surgical History:  Procedure Laterality Date  . NO PAST SURGERIES      Allergies as of 08/30/2018      Reactions   Tequin [gatifloxacin] Other (See Comments)   Unknown rxn per [facility]      Medication List       Accurate as of August 30, 2018 11:59 PM. Always use your most recent med list.        aspirin 81 MG chewable tablet Chew 1 tablet (81 mg total) by mouth daily.   bisacodyl 10 MG suppository Commonly known as:  DULCOLAX Place 10 mg rectally as needed for moderate constipation (constipation not relieved by milk of magnesia).   ferrous sulfate 325 (65 FE) MG tablet Take 325 mg by mouth daily with breakfast.   furosemide 40 MG tablet Commonly known as:  LASIX Take 40 mg by mouth every morning.   gabapentin 300 MG capsule Commonly known as:  NEURONTIN Take 1 capsule (300 mg total) by mouth at bedtime.   ipratropium-albuterol 0.5-2.5 (3) MG/3ML Soln Commonly known as:  DUONEB Take 3 mLs by nebulization every 6 (six) hours as needed.   magnesium hydroxide 400 MG/5ML suspension Commonly known as:  MILK OF MAGNESIA Take 30 mLs by mouth as needed (if no bowel movement in 3 days 1x/24 hours.).   metFORMIN 500 MG 24 hr tablet Commonly known as:  GLUCOPHAGE-XR Take 500 mg by mouth every evening. For diabetes   Nutritional  Supplement Liqd Take 1 each by mouth every evening. "Magic Cup" - Take one cup with dinner as supplement   potassium chloride 10 MEQ tablet Commonly known as:  K-DUR Take 30 mEq by mouth daily.   RA SALINE ENEMA RE Place 1 enema rectally as needed (constipation unrelieved by bisacodyl suppository 1x/24 hours.).   traMADol 50 MG tablet Commonly known as:  ULTRAM Take 50 mg by mouth 2 (two) times daily.   Vitamin D (Ergocalciferol) 1.25 MG (50000 UT) Caps capsule Commonly known as:  DRISDOL Take 50,000 Units by  mouth every Wednesday.       No orders of the defined types were placed in this encounter.   Immunization History  Administered Date(s) Administered  . Influenza-Unspecified 03/23/2015, 04/06/2016, 04/06/2017  . PPD Test 08/12/2013  . Pneumococcal Polysaccharide-23 06/23/2013    Social History   Tobacco Use  . Smoking status: Former Games developer  . Smokeless tobacco: Never Used  Substance Use Topics  . Alcohol use: No    Review of Systems  DATA OBTAINED: from nurse GENERAL:  no fevers, fatigue, appetite changes SKIN: No itching, rash HEENT: No complaint RESPIRATORY: No cough, wheezing, SOB CARDIAC: No chest pain, palpitations, lower extremity edema  GI: No abdominal pain, No N/V/D or constipation, No heartburn or reflux  GU: No dysuria, frequency or urgency, or incontinence  MUSCULOSKELETAL: No unrelieved bone/joint pain NEUROLOGIC: No headache, dizziness  PSYCHIATRIC: No overt anxiety or sadness  Vitals:   08/30/18 1518  BP: 120/78  Pulse: 78  Resp: 18  Temp: 97.9 F (36.6 C)   Body mass index is 20.34 kg/m. Physical Exam  GENERAL APPEARANCE: Alert, minimally conversant, No acute distress  SKIN: No diaphoresis rash HEENT: Unremarkable RESPIRATORY: Breathing is even, unlabored. Lung sounds are clear   CARDIOVASCULAR: Heart RRR no murmurs, rubs or gallops. No peripheral edema  GASTROINTESTINAL: Abdomen is soft, non-tender, not distended w/ normal bowel sounds.  GENITOURINARY: Bladder non tender, not distended  MUSCULOSKELETAL: No abnormal joints or musculature NEUROLOGIC: Cranial nerves 2-12 grossly intact. Moves all extremities PSYCHIATRIC: Mood and affect appropriate to situation some dementia, no behavioral issues  Patient Active Problem List   Diagnosis Date Noted  . Hyperlipidemia associated with type 2 diabetes mellitus (HCC) 06/10/2018  . Lung nodule < 6cm on CT 11/13/2016  . Vitamin D deficiency 04/11/2016  . Hyperlipidemia 02/11/2016  . Pain  11/07/2015  . Wheezing 11/07/2015  . Edema 10/25/2015  . Conjunctivitis 07/09/2015  . UTI (urinary tract infection) 03/24/2015  . GERD (gastroesophageal reflux disease) 12/17/2014  . Hypokalemia 12/17/2014  . Depression 11/25/2014  . Iron deficiency anemia 09/03/2014  . Chronic renal disease, stage 2, mildly decreased glomerular filtration rate (GFR) between 60-89 mL/min/1.73 square meter 09/03/2014  . Pain in joint, lower leg 08/21/2014  . Cough 01/27/2014  . Dizziness 01/12/2014  . Contusion of unspecified site 01/12/2014  . Breast mass, right 12/13/2013  . Lumbago 10/23/2013  . Type 2 diabetes, controlled, with neuropathy (HCC) 10/18/2013  . 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 Unspecified constipation 10/18/2013  . Plantar fasciitis, left 07/29/2013  . Physical deconditioning 07/21/2013  . Diabetes mellitus, controlled (HCC) 07/19/2013  . Chronic diastolic CHF (congestive heart failure) (HCC) 07/19/2013  . HCAP (healthcare-associated pneumonia) 07/18/2013  . Chronic diastolic heart failure (HCC) 06/24/2013  . Syncope 06/21/2013  . Syncope and collapse 06/21/2013  . Leukocytosis 06/21/2013  . Hypertensive heart disease with congestive heart failure (HCC) 06/21/2013  . Diabetes mellitus  due to underlying condition (HCC) 06/21/2013    CMP     Component Value Date/Time   NA 143 07/21/2018 1105   NA 141 05/14/2018   K 3.5 07/21/2018 1105   CL 106 07/21/2018 1105   CO2 28 07/21/2018 1105   GLUCOSE 109 (H) 07/21/2018 1105   BUN 17 07/21/2018 1105   BUN 22 (A) 05/14/2018   CREATININE 0.95 07/21/2018 1105   CALCIUM 9.3 07/21/2018 1105   PROT 7.6 07/21/2018 1105   ALBUMIN 4.0 07/21/2018 1105   AST 20 07/21/2018 1105   ALT 13 07/21/2018 1105   ALKPHOS 67 07/21/2018 1105   BILITOT 0.7 07/21/2018 1105   GFRNONAA 55 (L) 07/21/2018 1105   GFRAA >60 07/21/2018 1105   Recent Labs    05/14/18 07/09/18 1949 07/21/18 1105  NA 141  142 143  K 4.6 3.6 3.5  CL  --  102 106  CO2  --  29 28  GLUCOSE  --  120* 109*  BUN 22* 30* 17  CREATININE 1.1 1.47* 0.95  CALCIUM  --  8.8* 9.3   Recent Labs    05/01/18 07/09/18 1949 07/21/18 1105  AST ALT ALKPHOS 81 63 67  BILITOT  --  0.5 0.7  PROT  --  6.9 7.6  ALBUMIN  --  3.5 4.0   Recent Labs    05/01/18 07/09/18 1949 07/21/18 1105  WBC 5.1 4.4 4.2  HGB 10.6* 11.7* 12.0  HCT 31* 36.7 38.2  MCV  --  93.1 94.6  PLT 201 213 192   Recent Labs    05/01/18  CHOL 145  LDLCALC 78  TRIG 96   No results found for: MICROALBUR Lab Results  Component Value Date   TSH 0.30 (A) 05/01/2018   Lab Results  Component Value Date   HGBA1C 5.7 05/01/2018   Lab Results  Component Value Date   CHOL 145 05/01/2018   HDL 48 05/01/2018   LDLCALC 78 05/01/2018   TRIG 96 05/01/2018   CHOLHDL 3.2 06/22/2013    Significant Diagnostic Results in last 30 days:  No results found.  Assessment and Plan  Chronic diastolic CHF (congestive heart failure) No reported exacerbation; continue Lasix 40 mg daily  Diabetes mellitus, controlled A1c 5.7 on Glucophage Exar 500 mg daily; patient is no longer on ACE/ARB; and cholesterols are well controlled on no statins  Peripheral sensory neuropathy due to type 2 diabetes mellitus No reports of pain; continue Neurontin 300 mg nightly    Veta Dambrosia D. Margaret Hollingshead, MD

## 2018-09-01 ENCOUNTER — Encounter: Payer: Self-pay | Admitting: Internal Medicine

## 2018-09-01 NOTE — Assessment & Plan Note (Signed)
A1c 5.7 on Glucophage Exar 500 mg daily; patient is no longer on ACE/ARB; and cholesterols are well controlled on no statins

## 2018-09-01 NOTE — Assessment & Plan Note (Signed)
No reports of pain; continue Neurontin 300 mg nightly

## 2018-09-01 NOTE — Assessment & Plan Note (Signed)
No reported exacerbation; continue Lasix 40 mg daily 

## 2018-09-04 ENCOUNTER — Other Ambulatory Visit: Payer: Self-pay

## 2018-09-04 MED ORDER — TRAMADOL HCL 50 MG PO TABS: 50.0000 mg | ORAL_TABLET | Freq: Two times a day (BID) | ORAL | 0 refills | Status: DC

## 2018-09-12 DIAGNOSIS — R2681 Unsteadiness on feet: Secondary | ICD-10-CM | POA: Diagnosis not present

## 2018-09-12 DIAGNOSIS — I110 Hypertensive heart disease with heart failure: Secondary | ICD-10-CM | POA: Diagnosis not present

## 2018-09-27 ENCOUNTER — Encounter: Payer: Self-pay | Admitting: Adult Health

## 2018-09-27 ENCOUNTER — Non-Acute Institutional Stay (SKILLED_NURSING_FACILITY): Payer: Medicare Other | Admitting: Adult Health

## 2018-09-27 DIAGNOSIS — I5032 Chronic diastolic (congestive) heart failure: Secondary | ICD-10-CM | POA: Diagnosis not present

## 2018-09-27 DIAGNOSIS — E559 Vitamin D deficiency, unspecified: Secondary | ICD-10-CM

## 2018-09-27 DIAGNOSIS — D508 Other iron deficiency anemias: Secondary | ICD-10-CM

## 2018-09-27 DIAGNOSIS — E1140 Type 2 diabetes mellitus with diabetic neuropathy, unspecified: Secondary | ICD-10-CM | POA: Diagnosis not present

## 2018-09-27 DIAGNOSIS — N182 Chronic kidney disease, stage 2 (mild): Secondary | ICD-10-CM

## 2018-09-27 DIAGNOSIS — E1169 Type 2 diabetes mellitus with other specified complication: Secondary | ICD-10-CM

## 2018-09-27 DIAGNOSIS — E785 Hyperlipidemia, unspecified: Secondary | ICD-10-CM

## 2018-09-27 DIAGNOSIS — E1142 Type 2 diabetes mellitus with diabetic polyneuropathy: Secondary | ICD-10-CM | POA: Diagnosis not present

## 2018-09-27 DIAGNOSIS — I110 Hypertensive heart disease with heart failure: Secondary | ICD-10-CM

## 2018-09-27 NOTE — Progress Notes (Signed)
Location:    [facility] Room Number: 201D Place of Service:  SNF ([id number])   CODE STATUS: 201D  Allergies  Allergen Reactions  . Tequin [Gatifloxacin] Other (See Comments)    Unknown rxn per [facility]    Chief Complaint  Patient presents with  . Medical Management of Chronic Issues    hypertensive heart disease with chronic diastolic congestive heart failure; chronic diastolic CHF(congestive heart failure) type 2 diabetes controlled with neuropathy.     HPI:  She is a 85 year old long term resident of this facility being seen for the management of her chronic illnesses: hypertensive heart disease; chf; diabetes. There are no reports of cough or shortness of breath; no reports of uncontrolled pain;no reports of fevers.   Past Medical History:  Diagnosis Date  . Breast mass, right 12/13/2013  . Chronic diastolic CHF (congestive heart failure) (HCC) 07/19/2013  . Chronic diastolic heart failure (HCC)   . Chronic kidney disease, stage II (mild)   . Chronic renal disease, stage 2, mildly decreased glomerular filtration rate (GFR) between 60-89 mL/min/1.73 square meter 09/03/2014  . Closed T12 fracture (HCC) 07/16/2013  . Depression 07/18/2013  . Diabetes mellitus   . Diabetes mellitus due to underlying condition (HCC) 06/21/2013  . Diabetes mellitus without complication (HCC)   . Dysphagia, oropharyngeal phase   . GERD (gastroesophageal reflux disease) 12/17/2014  . Gout   . Hypertension   . Hypertensive heart disease with congestive heart failure (HCC) 06/21/2013  . Iron deficiency anemia 09/03/2014  . Iron deficiency anemia, unspecified   . Leukocytosis 07/18/2013  . Lumbago 10/23/2013  . Lung nodule < 6cm on CT 11/13/2016  . Osteopenia 07/16/2013  . Syncope 06/21/2013  . Type 2 diabetes, controlled, with neuropathy (HCC) 10/18/2013  . Unspecified constipation 10/18/2013  . Vitamin D deficiency 04/11/2016    Past Surgical History:  Procedure  Laterality Date  . NO PAST SURGERIES      Social History   Socioeconomic History  . Marital status: Single    Spouse name: Not on file  . Number of children: Not on file  . Years of education: Not on file  . Highest education level: Not on file  Occupational History  . Occupation: Domestic  Engineer, production  . Financial resource strain: Not on file  . Food insecurity:    Worry: Not on file    Inability: Not on file  . Transportation needs:    Medical: Not on file    Non-medical: Not on file  Tobacco Use  . Smoking status: Former Games developer  . Smokeless tobacco: Never Used  Substance and Sexual Activity  . Alcohol use: No  . Drug use: No  . Sexual activity: Not Currently  Lifestyle  . Physical activity:    Days per week: Not on file    Minutes per session: Not on file  . Stress: Not on file  Relationships  . Social connections:    Talks on phone: Not on file    Gets together: Not on file    Attends religious service: Not on file    Active member of club or organization: Not on file    Attends meetings of clubs or organizations: Not on file    Relationship status: Not on file  . Intimate partner violence:    Fear of current or ex partner: Not on file    Emotionally abused: Not on file    Physically abused: Not on file  Forced sexual activity: Not on file  Other Topics Concern  . Not on file  Social History Narrative   ** Merged History Encounter **       ** Merged History Encounter **      Admitted to [facility] 07/22/13   Never married   Former smoker   Alcohol none   Full Code          History reviewed. No pertinent family history.    VITAL SIGNS BP 124/69   Pulse 76   Temp 98 F (36.7 C)   Resp 18   Ht 4\' 9"  (1.448 m)   Wt 92 lb (41.7 kg)   LMP  (LMP Unknown)   BMI 19.91 kg/m   Outpatient Encounter Medications as of 09/27/2018  Medication Sig  . aspirin 81 MG chewable tablet Chew 1 tablet (81 mg total) by mouth daily.  . bisacodyl (DULCOLAX) 10  MG suppository Place 10 mg rectally as needed for moderate constipation (constipation not relieved by milk of magnesia).  . ferrous sulfate 325 (65 FE) MG tablet Take 325 mg by mouth daily with breakfast.   . furosemide (LASIX) 40 MG tablet Take 40 mg by mouth every morning.   . gabapentin (NEURONTIN) 300 MG capsule Take 1 capsule (300 mg total) by mouth at bedtime.  Marland Kitchen. ipratropium-albuterol (DUONEB) 0.5-2.5 (3) MG/3ML SOLN Take 3 mLs by nebulization every 6 (six) hours as needed.  . magnesium hydroxide (MILK OF MAGNESIA) 400 MG/5ML suspension Take 30 mLs by mouth as needed (if no bowel movement in 3 days 1x/24 hours.).   Marland Kitchen. metFORMIN (GLUCOPHAGE-XR) 500 MG 24 hr tablet Take 500 mg by mouth every evening. For diabetes  . NUTRITIONAL SUPPLEMENT LIQD Take 1 each by mouth every evening. "Magic Cup" - Take one cup with dinner as supplement  . potassium chloride (K-DUR) 10 MEQ tablet Take 30 mEq by mouth daily.   . Sodium Phosphates (RA SALINE ENEMA RE) Place 1 enema rectally as needed (constipation unrelieved by bisacodyl suppository 1x/24 hours.).  Marland Kitchen. traMADol (ULTRAM) 50 MG tablet Take 1 tablet (50 mg total) by mouth 2 (two) times daily.  . Vitamin D, Ergocalciferol, (DRISDOL) 50000 units CAPS capsule Take 50,000 Units by mouth every Wednesday.    No facility-administered encounter medications on file as of 09/27/2018.      SIGNIFICANT DIAGNOSTIC EXAMS  LABS REVIEWED TODAY:   05-01-18 chol 145 ldl 78;trig 96; hdl 48 hgb a1c 5.7; tsh 0.30 vit D 11.90 07-09-18: wbc 4,4 hgb 11.7; hct 36.7; mcv 93.1 pt 213; glucose 120; bun 30;creat 1.47; k+ 3.6; na++ 142; ca 8.8 liver normal albumin 3.5  07-21-18: wbc 4.2; hgb 12.0; hct 38.2 mcv 94.6 plt 192; glucose 109; bun 17; creat 0.95; k+ 3.5; na+= 143; ca 9.3 liver normal albumin 4.0   Review of Systems  Reason unable to perform ROS: unable to participate    Physical Exam Constitutional:      General: She is not in acute distress.    Appearance: She is  underweight. She is not diaphoretic.  Neck:     Musculoskeletal: Neck supple.     Thyroid: No thyromegaly.  Cardiovascular:     Rate and Rhythm: Normal rate and regular rhythm.     Pulses: Normal pulses.     Heart sounds: Normal heart sounds.  Pulmonary:     Effort: Pulmonary effort is normal. No respiratory distress.     Breath sounds: Normal breath sounds.  Abdominal:     General:  Bowel sounds are normal. There is no distension.     Palpations: Abdomen is soft.     Tenderness: There is no abdominal tenderness.  Musculoskeletal: Normal range of motion.     Right lower leg: No edema.     Left lower leg: No edema.  Lymphadenopathy:     Cervical: No cervical adenopathy.  Skin:    General: Skin is warm and dry.  Neurological:     Mental Status: She is alert. Mental status is at baseline.  Psychiatric:        Mood and Affect: Mood normal.       ASSESSMENT/ PLAN:  TODAY:   1. Hypertensive heart disease with chronic diastolic heart failure: is stable b/p 124/69 will continue to monitor her status. Is on asa 81 mg daily   2. Chronic diastolic heart failure: is stable will continue lasix 40 mg daily with k+ 30 meq daily   3. Type 2 diabetes controlled with neuropathy: is stable hgb a1c 5.7 will stop metformin at this time   4. Hyperlipidemia associated with type 2 diabetes mellitus: is stable ldl 78 will monitor  5. Peripheral sensory neuropathy due to type 2 diabetes mellitus: is stable will continue neurontin 300 mg daily and ultram 50 mg twice daily   6. Vit D deficiency: is stable will continue vit D 50,000 units weekly   7. Iron deficiency anemia secondary to inadequate dietary iron intake: is stable hgb 12.0 will continue iron daily   8. Chronic renal disease stage 2 mildly decreased glomerular filtration rate (GFR) between 60-89. Is stable bun 17; creat 0.95    MD is aware of resident's narcotic use and is in agreement with current plan of care. We will attempt to  wean resident as apropriate   Synthia Innocent NP [facility]  Contact [phone] Monday through Friday 8am- 5pm  After hours call [phone]

## 2018-10-08 ENCOUNTER — Other Ambulatory Visit: Payer: Self-pay | Admitting: Adult Health

## 2018-10-08 MED ORDER — TRAMADOL HCL 50 MG PO TABS: 50.0000 mg | ORAL_TABLET | Freq: Two times a day (BID) | ORAL | 0 refills | Status: DC

## 2018-10-26 ENCOUNTER — Non-Acute Institutional Stay (SKILLED_NURSING_FACILITY): Payer: Medicare Other | Admitting: Internal Medicine

## 2018-10-26 ENCOUNTER — Encounter: Payer: Self-pay | Admitting: Internal Medicine

## 2018-10-26 DIAGNOSIS — K219 Gastro-esophageal reflux disease without esophagitis: Secondary | ICD-10-CM

## 2018-10-26 DIAGNOSIS — E1142 Type 2 diabetes mellitus with diabetic polyneuropathy: Secondary | ICD-10-CM

## 2018-10-26 DIAGNOSIS — I110 Hypertensive heart disease with heart failure: Secondary | ICD-10-CM

## 2018-10-26 DIAGNOSIS — I5032 Chronic diastolic (congestive) heart failure: Secondary | ICD-10-CM | POA: Diagnosis not present

## 2018-10-26 NOTE — Progress Notes (Signed)
Location:  [facility] Room Number: 201D Place of Service:  SNF [phone])  Randon Goldsmith. Lyn Hollingshead, MD  [name], MD as PCP - General (Internal Medicine)  Extended Emergency Contact Information Primary Emergency Contact: Lamour,[name] Address: [address]          [place], [place] [address] [name] of [place] Home Phone: [phone] Mobile Phone: [phone] Relation: Sister Secondary Emergency Contact: [name],[name] Address: [address]          [place], [place] [address] [name] of [place] Home Phone: [phone] Relation: Granddaughter    Allergies: Tequin [gatifloxacin]  Chief Complaint  Patient presents with  . Medical Management of Chronic Issues    Routine visit    HPI: Patient is 85 y.o. female who is being seen for routine issues of hypertension, GERD, peripheral neuropathy.  Past Medical History:  Diagnosis Date  . Breast mass, right 12/13/2013  . Chronic diastolic CHF (congestive heart failure) (HCC) 07/19/2013  . Chronic diastolic heart failure (HCC)   . Chronic kidney disease, stage II (mild)   . Chronic renal disease, stage 2, mildly decreased glomerular filtration rate (GFR) between 60-89 mL/min/1.73 square meter 09/03/2014  . Closed T12 fracture (HCC) 07/16/2013  . Depression 07/18/2013  . Diabetes mellitus   . Diabetes mellitus due to underlying condition (HCC) 06/21/2013  . Diabetes mellitus without complication (HCC)   . Dysphagia, oropharyngeal phase   . GERD (gastroesophageal reflux disease) 12/17/2014  . Gout   . Hypertension   . Hypertensive heart disease with congestive heart failure (HCC) 06/21/2013  . Iron deficiency anemia 09/03/2014  . Iron deficiency anemia, unspecified   . Leukocytosis 07/18/2013  . Lumbago 10/23/2013  . Lung nodule < 6cm on CT 11/13/2016  . Osteopenia 07/16/2013  . Syncope 06/21/2013  . Type 2 diabetes, controlled, with neuropathy (HCC) 10/18/2013  .  Unspecified constipation 10/18/2013  . Vitamin D deficiency 04/11/2016    Past Surgical History:  Procedure Laterality Date  . NO PAST SURGERIES      Allergies as of 10/26/2018      Reactions   Tequin [gatifloxacin] Other (See Comments)   Unknown rxn per [facility]      Medication List       Accurate as of Oct 26, 2018 11:59 PM. Always use your most recent med list.        aspirin 81 MG chewable tablet Chew 1 tablet (81 mg total) by mouth daily.   bisacodyl 10 MG suppository Commonly known as:  DULCOLAX Place 10 mg rectally as needed for moderate constipation (constipation not relieved by milk of magnesia).   ferrous sulfate 325 (65 FE) MG tablet Take 325 mg by mouth daily with breakfast.   furosemide 40 MG tablet Commonly known as:  LASIX Take 40 mg by mouth every morning.   gabapentin 300 MG capsule Commonly known as:  NEURONTIN Take 1 capsule (300 mg total) by mouth at bedtime.   ipratropium-albuterol 0.5-2.5 (3) MG/3ML Soln Commonly known as:  DUONEB Take 3 mLs by nebulization every 6 (six) hours as needed.   magnesium hydroxide 400 MG/5ML suspension Commonly known as:  MILK OF MAGNESIA Take 30 mLs by mouth as needed (if no bowel movement in 3 days 1x/24 hours.).   Nutritional Supplement Liqd Take 1 each by mouth every evening. "Magic Cup" - Take one cup with dinner as supplement   potassium chloride 10 MEQ tablet Commonly known as:  K-DUR Take  30 mEq by mouth daily.   RA SALINE ENEMA RE Place 1 enema rectally as needed (constipation unrelieved by bisacodyl suppository 1x/24 hours.).   traMADol 50 MG tablet Commonly known as:  ULTRAM Take 1 tablet (50 mg total) by mouth 2 (two) times daily.   Vitamin D (Ergocalciferol) 1.25 MG (50000 UT) Caps capsule Commonly known as:  DRISDOL Take 50,000 Units by mouth every Wednesday.       No orders of the defined types were placed in this encounter.   Immunization History  Administered Date(s) Administered  .  Influenza-Unspecified 03/23/2015, 04/06/2016, 04/06/2017  . PPD Test 08/12/2013  . Pneumococcal Polysaccharide-23 06/23/2013    Social History   Tobacco Use  . Smoking status: Former Games developer  . Smokeless tobacco: Never Used  Substance Use Topics  . Alcohol use: No    Review of Systems  DATA OBTAINED: from nurse GENERAL:  no fevers, fatigue, appetite changes SKIN: No itching, rash HEENT: No complaint RESPIRATORY: No cough, wheezing, SOB CARDIAC: No chest pain, palpitations, lower extremity edema  GI: No abdominal pain, No N/V/D or constipation, No heartburn or reflux  GU: No dysuria, frequency or urgency, or incontinence  MUSCULOSKELETAL: No unrelieved bone/joint pain NEUROLOGIC: No headache, dizziness  PSYCHIATRIC: No overt anxiety or sadness  Vitals:   10/26/18 1621  BP: 125/75  Pulse: 72  Resp: 18  Temp: (!) 97 F (36.1 C)   Body mass index is 19.52 kg/m. Physical Exam  GENERAL APPEARANCE: Alert, minimal conversant, No acute distress  SKIN: No diaphoresis rash HEENT: Unremarkable RESPIRATORY: Breathing is even, unlabored. Lung sounds are clear   CARDIOVASCULAR: Heart RRR no murmurs, rubs or gallops. No peripheral edema  GASTROINTESTINAL: Abdomen is soft, non-tender, not distended w/ normal bowel sounds.  GENITOURINARY: Bladder non tender, not distended  MUSCULOSKELETAL: No abnormal joints or musculature NEUROLOGIC: Cranial nerves 2-12 grossly intact. Moves all extremities PSYCHIATRIC: Mood and affect appropriate to situation with some dementia, no behavioral issues  Patient Active Problem List   Diagnosis Date Noted  . Hyperlipidemia associated with type 2 diabetes mellitus (HCC) 06/10/2018  . Lung nodule < 6cm on CT 11/13/2016  . Vitamin D deficiency 04/11/2016  . UTI (urinary tract infection) 03/24/2015  . GERD (gastroesophageal reflux disease) 12/17/2014  . Hypokalemia 12/17/2014  . Depression 11/25/2014  . Iron deficiency anemia 09/03/2014  . Chronic  renal disease, stage 2, mildly decreased glomerular filtration rate (GFR) between 60-89 mL/min/1.73 square meter 09/03/2014  . Breast mass, right 12/13/2013  . Lumbago 10/23/2013  . Type 2 diabetes, controlled, with neuropathy (HCC) 10/18/2013  . 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 Diabetes mellitus, controlled (HCC) 07/19/2013  . Chronic diastolic CHF (congestive heart failure) (HCC) 07/19/2013  . Leukocytosis 06/21/2013  . Hypertensive heart disease with congestive heart failure (HCC) 06/21/2013  . Diabetes mellitus due to underlying condition (HCC) 06/21/2013    CMP     Component Value Date/Time   NA 143 07/21/2018 1105   NA 141 05/14/2018   K 3.5 07/21/2018 1105   CL 106 07/21/2018 1105   CO2 28 07/21/2018 1105   GLUCOSE 109 (H) 07/21/2018 1105   BUN 17 07/21/2018 1105   BUN 22 (A) 05/14/2018   CREATININE 0.95 07/21/2018 1105   CALCIUM 9.3 07/21/2018 1105   PROT 7.6 07/21/2018 1105   ALBUMIN 4.0 07/21/2018 1105   AST 20 07/21/2018 1105   ALT 13 07/21/2018 1105   ALKPHOS 67 07/21/2018 1105   BILITOT  0.7 07/21/2018 1105   GFRNONAA 55 (L) 07/21/2018 1105   GFRAA >60 07/21/2018 1105   Recent Labs    05/14/18 07/09/18 1949 07/21/18 1105  NA 141 142 143  K 4.6 3.6 3.5  CL  --  102 106  CO2  --  29 28  GLUCOSE  --  120* 109*  BUN 22* 30* 17  CREATININE 1.1 1.47* 0.95  CALCIUM  --  8.8* 9.3   Recent Labs    05/01/18 07/09/18 1949 07/21/18 1105  AST 19 18 20   ALT 16 11 13   ALKPHOS 81 63 67  BILITOT  --  0.5 0.7  PROT  --  6.9 7.6  ALBUMIN  --  3.5 4.0   Recent Labs    05/01/18 07/09/18 1949 07/21/18 1105  WBC 5.1 4.4 4.2  HGB 10.6* 11.7* 12.0  HCT 31* 36.7 38.2  MCV  --  93.1 94.6  PLT 201 213 192   Recent Labs    05/01/18  CHOL 145  LDLCALC 78  TRIG 96   No results found for: MICROALBUR Lab Results  Component Value Date   TSH 0.30 (A) 05/01/2018   Lab Results  Component Value Date   HGBA1C 5.7  05/01/2018   Lab Results  Component Value Date   CHOL 145 05/01/2018   HDL 48 05/01/2018   LDLCALC 78 05/01/2018   TRIG 96 05/01/2018   CHOLHDL 3.2 06/22/2013    Significant Diagnostic Results in last 30 days:  No results found.  Assessment and Plan  Hypertensive heart disease with congestive heart failure Stable; continue Lasix 40 mg daily  GERD (gastroesophageal reflux disease) No reports of reflux; continue Prilosec 20 mg daily  Peripheral sensory neuropathy due to type 2 diabetes mellitus Continues without pain; continue Neurontin 300 mg nightly     Miara Emminger D. Lyn HollingsheadAlexander, MD

## 2018-10-27 ENCOUNTER — Encounter: Payer: Self-pay | Admitting: Internal Medicine

## 2018-10-27 NOTE — Assessment & Plan Note (Signed)
No reports of reflux; continue Prilosec 20 mg daily

## 2018-10-27 NOTE — Assessment & Plan Note (Signed)
Continues without pain; continue Neurontin 300 mg nightly

## 2018-10-27 NOTE — Assessment & Plan Note (Signed)
Stable; continue Lasix 40 mg daily 

## 2018-11-05 ENCOUNTER — Other Ambulatory Visit: Payer: Self-pay | Admitting: Internal Medicine

## 2018-11-05 MED ORDER — TRAMADOL HCL 50 MG PO TABS: 50.0000 mg | ORAL_TABLET | Freq: Two times a day (BID) | ORAL | 0 refills | Status: DC

## 2018-11-06 DIAGNOSIS — R41841 Cognitive communication deficit: Secondary | ICD-10-CM | POA: Diagnosis not present

## 2018-11-06 DIAGNOSIS — M6281 Muscle weakness (generalized): Secondary | ICD-10-CM | POA: Diagnosis not present

## 2018-11-06 DIAGNOSIS — R262 Difficulty in walking, not elsewhere classified: Secondary | ICD-10-CM | POA: Diagnosis not present

## 2018-11-06 DIAGNOSIS — Z9181 History of falling: Secondary | ICD-10-CM | POA: Diagnosis not present

## 2018-11-07 DIAGNOSIS — M6281 Muscle weakness (generalized): Secondary | ICD-10-CM | POA: Diagnosis not present

## 2018-11-07 DIAGNOSIS — R41841 Cognitive communication deficit: Secondary | ICD-10-CM | POA: Diagnosis not present

## 2018-11-07 DIAGNOSIS — Z9181 History of falling: Secondary | ICD-10-CM | POA: Diagnosis not present

## 2018-11-07 DIAGNOSIS — R262 Difficulty in walking, not elsewhere classified: Secondary | ICD-10-CM | POA: Diagnosis not present

## 2018-11-08 DIAGNOSIS — Z9181 History of falling: Secondary | ICD-10-CM | POA: Diagnosis not present

## 2018-11-08 DIAGNOSIS — R262 Difficulty in walking, not elsewhere classified: Secondary | ICD-10-CM | POA: Diagnosis not present

## 2018-11-08 DIAGNOSIS — M6281 Muscle weakness (generalized): Secondary | ICD-10-CM | POA: Diagnosis not present

## 2018-11-08 DIAGNOSIS — R41841 Cognitive communication deficit: Secondary | ICD-10-CM | POA: Diagnosis not present

## 2018-11-09 DIAGNOSIS — R41841 Cognitive communication deficit: Secondary | ICD-10-CM | POA: Diagnosis not present

## 2018-11-09 DIAGNOSIS — R262 Difficulty in walking, not elsewhere classified: Secondary | ICD-10-CM | POA: Diagnosis not present

## 2018-11-09 DIAGNOSIS — Z9181 History of falling: Secondary | ICD-10-CM | POA: Diagnosis not present

## 2018-11-09 DIAGNOSIS — M6281 Muscle weakness (generalized): Secondary | ICD-10-CM | POA: Diagnosis not present

## 2018-11-11 DIAGNOSIS — R262 Difficulty in walking, not elsewhere classified: Secondary | ICD-10-CM | POA: Diagnosis not present

## 2018-11-11 DIAGNOSIS — R41841 Cognitive communication deficit: Secondary | ICD-10-CM | POA: Diagnosis not present

## 2018-11-11 DIAGNOSIS — M6281 Muscle weakness (generalized): Secondary | ICD-10-CM | POA: Diagnosis not present

## 2018-11-11 DIAGNOSIS — Z9181 History of falling: Secondary | ICD-10-CM | POA: Diagnosis not present

## 2018-11-13 DIAGNOSIS — R262 Difficulty in walking, not elsewhere classified: Secondary | ICD-10-CM | POA: Diagnosis not present

## 2018-11-13 DIAGNOSIS — M6281 Muscle weakness (generalized): Secondary | ICD-10-CM | POA: Diagnosis not present

## 2018-11-13 DIAGNOSIS — Z9181 History of falling: Secondary | ICD-10-CM | POA: Diagnosis not present

## 2018-11-13 DIAGNOSIS — R41841 Cognitive communication deficit: Secondary | ICD-10-CM | POA: Diagnosis not present

## 2018-11-14 DIAGNOSIS — M6281 Muscle weakness (generalized): Secondary | ICD-10-CM | POA: Diagnosis not present

## 2018-11-14 DIAGNOSIS — Z9181 History of falling: Secondary | ICD-10-CM | POA: Diagnosis not present

## 2018-11-14 DIAGNOSIS — R262 Difficulty in walking, not elsewhere classified: Secondary | ICD-10-CM | POA: Diagnosis not present

## 2018-11-14 DIAGNOSIS — R41841 Cognitive communication deficit: Secondary | ICD-10-CM | POA: Diagnosis not present

## 2018-11-15 DIAGNOSIS — Z9181 History of falling: Secondary | ICD-10-CM | POA: Diagnosis not present

## 2018-11-15 DIAGNOSIS — R262 Difficulty in walking, not elsewhere classified: Secondary | ICD-10-CM | POA: Diagnosis not present

## 2018-11-15 DIAGNOSIS — M6281 Muscle weakness (generalized): Secondary | ICD-10-CM | POA: Diagnosis not present

## 2018-11-15 DIAGNOSIS — R41841 Cognitive communication deficit: Secondary | ICD-10-CM | POA: Diagnosis not present

## 2018-11-16 DIAGNOSIS — Z9181 History of falling: Secondary | ICD-10-CM | POA: Diagnosis not present

## 2018-11-16 DIAGNOSIS — R41841 Cognitive communication deficit: Secondary | ICD-10-CM | POA: Diagnosis not present

## 2018-11-16 DIAGNOSIS — R262 Difficulty in walking, not elsewhere classified: Secondary | ICD-10-CM | POA: Diagnosis not present

## 2018-11-16 DIAGNOSIS — M6281 Muscle weakness (generalized): Secondary | ICD-10-CM | POA: Diagnosis not present

## 2018-11-19 DIAGNOSIS — R262 Difficulty in walking, not elsewhere classified: Secondary | ICD-10-CM | POA: Diagnosis not present

## 2018-11-19 DIAGNOSIS — R41841 Cognitive communication deficit: Secondary | ICD-10-CM | POA: Diagnosis not present

## 2018-11-19 DIAGNOSIS — Z9181 History of falling: Secondary | ICD-10-CM | POA: Diagnosis not present

## 2018-11-19 DIAGNOSIS — M6281 Muscle weakness (generalized): Secondary | ICD-10-CM | POA: Diagnosis not present

## 2018-11-20 DIAGNOSIS — R262 Difficulty in walking, not elsewhere classified: Secondary | ICD-10-CM | POA: Diagnosis not present

## 2018-11-20 DIAGNOSIS — Z9181 History of falling: Secondary | ICD-10-CM | POA: Diagnosis not present

## 2018-11-20 DIAGNOSIS — R41841 Cognitive communication deficit: Secondary | ICD-10-CM | POA: Diagnosis not present

## 2018-11-20 DIAGNOSIS — M6281 Muscle weakness (generalized): Secondary | ICD-10-CM | POA: Diagnosis not present

## 2018-11-21 DIAGNOSIS — R262 Difficulty in walking, not elsewhere classified: Secondary | ICD-10-CM | POA: Diagnosis not present

## 2018-11-21 DIAGNOSIS — R41841 Cognitive communication deficit: Secondary | ICD-10-CM | POA: Diagnosis not present

## 2018-11-21 DIAGNOSIS — M6281 Muscle weakness (generalized): Secondary | ICD-10-CM | POA: Diagnosis not present

## 2018-11-21 DIAGNOSIS — Z9181 History of falling: Secondary | ICD-10-CM | POA: Diagnosis not present

## 2018-11-22 DIAGNOSIS — R41841 Cognitive communication deficit: Secondary | ICD-10-CM | POA: Diagnosis not present

## 2018-11-22 DIAGNOSIS — R262 Difficulty in walking, not elsewhere classified: Secondary | ICD-10-CM | POA: Diagnosis not present

## 2018-11-22 DIAGNOSIS — Z9181 History of falling: Secondary | ICD-10-CM | POA: Diagnosis not present

## 2018-11-22 DIAGNOSIS — M6281 Muscle weakness (generalized): Secondary | ICD-10-CM | POA: Diagnosis not present

## 2018-11-23 DIAGNOSIS — M6281 Muscle weakness (generalized): Secondary | ICD-10-CM | POA: Diagnosis not present

## 2018-11-23 DIAGNOSIS — Z9181 History of falling: Secondary | ICD-10-CM | POA: Diagnosis not present

## 2018-11-23 DIAGNOSIS — R41841 Cognitive communication deficit: Secondary | ICD-10-CM | POA: Diagnosis not present

## 2018-11-23 DIAGNOSIS — R262 Difficulty in walking, not elsewhere classified: Secondary | ICD-10-CM | POA: Diagnosis not present

## 2018-11-26 ENCOUNTER — Non-Acute Institutional Stay (SKILLED_NURSING_FACILITY): Payer: Medicare Other | Admitting: Internal Medicine

## 2018-11-26 ENCOUNTER — Encounter: Payer: Self-pay | Admitting: Internal Medicine

## 2018-11-26 DIAGNOSIS — E1169 Type 2 diabetes mellitus with other specified complication: Secondary | ICD-10-CM | POA: Diagnosis not present

## 2018-11-26 DIAGNOSIS — I5032 Chronic diastolic (congestive) heart failure: Secondary | ICD-10-CM

## 2018-11-26 DIAGNOSIS — E785 Hyperlipidemia, unspecified: Secondary | ICD-10-CM

## 2018-11-26 DIAGNOSIS — M6281 Muscle weakness (generalized): Secondary | ICD-10-CM | POA: Diagnosis not present

## 2018-11-26 DIAGNOSIS — N182 Chronic kidney disease, stage 2 (mild): Secondary | ICD-10-CM | POA: Diagnosis not present

## 2018-11-26 DIAGNOSIS — R262 Difficulty in walking, not elsewhere classified: Secondary | ICD-10-CM | POA: Diagnosis not present

## 2018-11-26 DIAGNOSIS — Z9181 History of falling: Secondary | ICD-10-CM | POA: Diagnosis not present

## 2018-11-26 NOTE — Progress Notes (Signed)
Location:  [facility] Room Number: 201/D Place of Service:  SNF (31)  [name], MD  [name], MD as PCP - General (Internal Medicine)  Extended Emergency Contact Information Primary Emergency Contact: Penaflor,[name] Address: [address]          [place], [place] [address] [name] of [place] Home Phone: [phone] Mobile Phone: [phone] Relation: Sister Secondary Emergency Contact: [name],[name] Address: [address]          [place], [place] [address] [name] of [place] Home Phone: [phone] Relation: Granddaughter    Allergies: Tequin [gatifloxacin]  Chief Complaint  Patient presents with  . Medical Management of Chronic Issues    Routine visit of medical management  . Best Practice Recommendations    Prevnar-13, T-Dap, Dexa Scan  . Quality Metric Gaps    Microalbumin , Hemoglobin A1C,    HPI: Patient is 85 y.o. female who is being seen for routine issues of chronic diastolic congestive heart failure, hyperlipidemia, and chronic kidney disease now stage III.  Past Medical History:  Diagnosis Date  . Breast mass, right 12/13/2013  . Chronic diastolic CHF (congestive heart failure) (HCC) 07/19/2013  . Chronic diastolic heart failure (HCC)   . Chronic kidney disease, stage II (mild)   . Chronic renal disease, stage 2, mildly decreased glomerular filtration rate (GFR) between 60-89 mL/min/1.73 square meter 09/03/2014  . Closed T12 fracture (HCC) 07/16/2013  . Depression 07/18/2013  . Diabetes mellitus   . Diabetes mellitus due to underlying condition (HCC) 06/21/2013  . Diabetes mellitus without complication (HCC)   . Dysphagia, oropharyngeal phase   . GERD (gastroesophageal reflux disease) 12/17/2014  . Gout   . Hypertension   . Hypertensive heart disease with congestive heart failure (HCC) 06/21/2013  . Iron deficiency anemia 09/03/2014  . Iron deficiency anemia, unspecified   .  Leukocytosis 07/18/2013  . Lumbago 10/23/2013  . Lung nodule < 6cm on CT 11/13/2016  . Osteopenia 07/16/2013  . Syncope 06/21/2013  . Type 2 diabetes, controlled, with neuropathy (HCC) 10/18/2013  . Unspecified constipation 10/18/2013  . Vitamin D deficiency 04/11/2016    Past Surgical History:  Procedure Laterality Date  . NO PAST SURGERIES      Allergies as of 11/26/2018      Reactions   Tequin [gatifloxacin] Other (See Comments)   Unknown rxn per [facility]      Medication List       Accurate as of November 26, 2018 11:59 PM. If you have any questions, ask your nurse or doctor.        STOP taking these medications   bisacodyl 10 MG suppository Commonly known as:  DULCOLAX Stopped by:  Merrilee Seashore, MD   ipratropium-albuterol 0.5-2.5 (3) MG/3ML Soln Commonly known as:  DUONEB Stopped by:  Merrilee Seashore, MD   magnesium hydroxide 400 MG/5ML suspension Commonly known as:  MILK OF MAGNESIA Stopped by:  Merrilee Seashore, MD   RA SALINE ENEMA RE Stopped by:  Merrilee Seashore, MD     TAKE these medications   aspirin 81 MG chewable tablet Chew 1 tablet (81 mg total) by mouth daily.   ferrous sulfate 325 (65 FE) MG tablet Take 325 mg by mouth daily with breakfast.   furosemide 40 MG tablet Commonly known as:  LASIX Take 40 mg by mouth every morning.   gabapentin 300 MG capsule Commonly known as:  NEURONTIN Take 1 capsule (300 mg total) by  mouth at bedtime.   Nutritional Supplement Liqd Take 1 each by mouth every evening. "Magic Cup" - Take one cup with dinner as supplement   potassium chloride 10 MEQ tablet Commonly known as:  K-DUR Take 30 mEq by mouth daily.   traMADol 50 MG tablet Commonly known as:  ULTRAM Take 1 tablet (50 mg total) by mouth 2 (two) times daily.   Vitamin D (Ergocalciferol) 1.25 MG (50000 UT) Caps capsule Commonly known as:  DRISDOL Take 50,000 Units by mouth every Wednesday.       No orders of the defined types were placed in this encounter.    Immunization History  Administered Date(s) Administered  . Influenza-Unspecified 03/23/2015, 04/06/2016, 04/06/2017  . PPD Test 08/12/2013  . Pneumococcal Polysaccharide-23 06/23/2013    Social History   Tobacco Use  . Smoking status: Former Games developermoker  . Smokeless tobacco: Never Used  Substance Use Topics  . Alcohol use: No    Review of Systems  DATA OBTAINED: from nurse GENERAL:  no fevers, fatigue, appetite changes SKIN: No itching, rash HEENT: No complaint RESPIRATORY: No cough, wheezing, SOB CARDIAC: No chest pain, palpitations, lower extremity edema  GI: No abdominal pain, No N/V/D or constipation, No heartburn or reflux  GU: No dysuria, frequency or urgency, or incontinence  MUSCULOSKELETAL: No unrelieved bone/joint pain NEUROLOGIC: No headache, dizziness  PSYCHIATRIC: No overt anxiety or sadness  Vitals:   11/26/18 0851  BP: (!) 146/72  Pulse: 62  Resp: 18  Temp: 98.5 F (36.9 C)   Body mass index is 19.87 kg/m. Physical Exam  GENERAL APPEARANCE: Alert,  No acute distress  SKIN: No diaphoresis rash HEENT: Unremarkable RESPIRATORY: Breathing is even, unlabored. Lung sounds are clear   CARDIOVASCULAR: Heart RRR no murmurs, rubs or gallops. No peripheral edema  GASTROINTESTINAL: Abdomen is soft, non-tender, not distended w/ normal bowel sounds.  GENITOURINARY: Bladder non tender, not distended  MUSCULOSKELETAL: No abnormal joints or musculature NEUROLOGIC: Cranial nerves 2-12 grossly intact. Moves all extremities PSYCHIATRIC: Mood and affect appropriate to situation with dementia, no behavioral issues  Patient Active Problem List   Diagnosis Date Noted  . Hyperlipidemia associated with type 2 diabetes mellitus (HCC) 06/10/2018  . Lung nodule < 6cm on CT 11/13/2016  . Vitamin D deficiency 04/11/2016  . UTI (urinary tract infection) 03/24/2015  . GERD (gastroesophageal reflux disease) 12/17/2014  . Hypokalemia 12/17/2014  . Depression 11/25/2014  .  Iron deficiency anemia 09/03/2014  . Chronic renal disease, stage 2, mildly decreased glomerular filtration rate (GFR) between 60-89 mL/min/1.73 square meter 09/03/2014  . Breast mass, right 12/13/2013  . Lumbago 10/23/2013  . Type 2 diabetes, controlled, with neuropathy (HCC) 10/18/2013  . 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 Diabetes mellitus, controlled (HCC) 07/19/2013  . Chronic diastolic CHF (congestive heart failure) (HCC) 07/19/2013  . Leukocytosis 06/21/2013  . Hypertensive heart disease with congestive heart failure (HCC) 06/21/2013  . Diabetes mellitus due to underlying condition (HCC) 06/21/2013    CMP     Component Value Date/Time   NA 143 07/21/2018 1105   NA 141 05/14/2018   K 3.5 07/21/2018 1105   CL 106 07/21/2018 1105   CO2 28 07/21/2018 1105   GLUCOSE 109 (H) 07/21/2018 1105   BUN 17 07/21/2018 1105   BUN 22 (A) 05/14/2018   CREATININE 0.95 07/21/2018 1105   CALCIUM 9.3 07/21/2018 1105   PROT 7.6 07/21/2018 1105   ALBUMIN 4.0 07/21/2018 1105   AST 20  07/21/2018 1105   ALT 13 07/21/2018 1105   ALKPHOS 67 07/21/2018 1105   BILITOT 0.7 07/21/2018 1105   GFRNONAA 55 (L) 07/21/2018 1105   GFRAA >60 07/21/2018 1105   Recent Labs    05/14/18 07/09/18 1949 07/21/18 1105  NA 141 142 143  K 4.6 3.6 3.5  CL  --  102 106  CO2  --  29 28  GLUCOSE  --  120* 109*  BUN 22* 30* 17  CREATININE 1.1 1.47* 0.95  CALCIUM  --  8.8* 9.3   Recent Labs    05/01/18 07/09/18 1949 07/21/18 1105  AST 19 18 20   ALT 16 11 13   ALKPHOS 81 63 67  BILITOT  --  0.5 0.7  PROT  --  6.9 7.6  ALBUMIN  --  3.5 4.0   Recent Labs    05/01/18 07/09/18 1949 07/21/18 1105  WBC 5.1 4.4 4.2  HGB 10.6* 11.7* 12.0  HCT 31* 36.7 38.2  MCV  --  93.1 94.6  PLT 201 213 192   Recent Labs    05/01/18  CHOL 145  LDLCALC 78  TRIG 96   No results found for: MICROALBUR Lab Results  Component Value Date   TSH 0.30 (A) 05/01/2018   Lab  Results  Component Value Date   HGBA1C 5.7 05/01/2018   Lab Results  Component Value Date   CHOL 145 05/01/2018   HDL 48 05/01/2018   LDLCALC 78 05/01/2018   TRIG 96 05/01/2018   CHOLHDL 3.2 06/22/2013    Significant Diagnostic Results in last 30 days:  No results found.  Assessment and Plan  Chronic diastolic CHF (congestive heart failure) No reported exacerbations, very stable; continue Lasix 40 mg daily  Hyperlipidemia associated with type 2 diabetes mellitus (HCC) HDL 48, LDL 78 on no medications; at her age I consider this acceptable; will monitor intervals  Chronic renal disease, stage 2, mildly decreased glomerular filtration rate (GFR) between 60-89 mL/min/1.73 square meter GFR is 55 so stage III chronic kidney disease; will monitor intervals     [name] MD

## 2018-11-26 NOTE — Progress Notes (Deleted)
: Provider: Merrilee Seashore MD.  Location:  [name] Room Number: 201/D Place of Service:  SNF ([phone])  PCP: Margaret Hanks, MD Patient Care Team: Margaret Hanks, MD as PCP - General (Internal Medicine)  Extended Emergency Contact Information Primary Emergency Contact: [name],Margaret Address: [address]          [place], [place] [address] [name] of [place] Home Phone: [phone] Mobile Phone: [phone] Relation: Sister Secondary Emergency Contact: Margaret,[name] Address: [address]          [place], [place] [address] [name] of [place] Home Phone: [phone] Relation: Granddaughter     Allergies: Tequin [gatifloxacin]  Chief Complaint  Patient presents with  . Medical Management of Chronic Issues    Routine visit of medical management  . Best Practice Recommendations    Prevnar-13, T-Dap, Dexa Scan  . Quality Metric Gaps    Microalbumin , Hemoglobin A1C,    HPI: Patient is 85 y.o. female who  Past Medical History:  Diagnosis Date  . Breast mass, right 12/13/2013  . Chronic diastolic CHF (congestive heart failure) (HCC) 07/19/2013  . Chronic diastolic heart failure (HCC)   . Chronic kidney disease, stage II (mild)   . Chronic renal disease, stage 2, mildly decreased glomerular filtration rate (GFR) between 60-89 mL/min/1.73 square meter 09/03/2014  . Closed T12 fracture (HCC) 07/16/2013  . Depression 07/18/2013  . Diabetes mellitus   . Diabetes mellitus due to underlying condition (HCC) 06/21/2013  . Diabetes mellitus without complication (HCC)   . Dysphagia, oropharyngeal phase   . GERD (gastroesophageal reflux disease) 12/17/2014  . Gout   . Hypertension   . Hypertensive heart disease with congestive heart failure (HCC) 06/21/2013  . Iron deficiency anemia 09/03/2014  . Iron deficiency anemia, unspecified   . Leukocytosis 07/18/2013  . Lumbago 10/23/2013  . Lung nodule < 6cm on CT 11/13/2016  . Osteopenia  07/16/2013  . Syncope 06/21/2013  . Type 2 diabetes, controlled, with neuropathy (HCC) 10/18/2013  . Unspecified constipation 10/18/2013  . Vitamin D deficiency 04/11/2016    Past Surgical History:  Procedure Laterality Date  . NO PAST SURGERIES      Allergies as of 11/26/2018      Reactions   Tequin [gatifloxacin] Other (See Comments)   Unknown rxn per [facility]      Medication List       Accurate as of November 26, 2018  9:42 AM. If you have any questions, ask your nurse or doctor.        STOP taking these medications   bisacodyl 10 MG suppository Commonly known as:  DULCOLAX Stopped by:  Margaret Seashore, MD   ipratropium-albuterol 0.5-2.5 (3) MG/3ML Soln Commonly known as:  DUONEB Stopped by:  Margaret Seashore, MD   magnesium hydroxide 400 MG/5ML suspension Commonly known as:  MILK OF MAGNESIA Stopped by:  Margaret Seashore, MD   RA SALINE ENEMA RE Stopped by:  Margaret Seashore, MD     TAKE these medications   aspirin 81 MG chewable tablet Chew 1 tablet (81 mg total) by mouth daily.   ferrous sulfate 325 (65 FE) MG tablet Take 325 mg by mouth daily with breakfast.   furosemide 40 MG tablet Commonly known as:  LASIX Take 40 mg by mouth every morning.   gabapentin 300 MG capsule Commonly known as:  NEURONTIN Take 1 capsule (300 mg total) by mouth at bedtime.   Nutritional Supplement Liqd Take 1 each by  mouth every evening. "Magic Cup" - Take one cup with dinner as supplement   potassium chloride 10 MEQ tablet Commonly known as:  K-DUR Take 30 mEq by mouth daily.   traMADol 50 MG tablet Commonly known as:  ULTRAM Take 1 tablet (50 mg total) by mouth 2 (two) times daily.   Vitamin D (Ergocalciferol) 1.25 MG (50000 UT) Caps capsule Commonly known as:  DRISDOL Take 50,000 Units by mouth every Wednesday.       No orders of the defined types were placed in this encounter.   Immunization History  Administered Date(s) Administered  . Influenza-Unspecified 03/23/2015,  04/06/2016, 04/06/2017  . PPD Test 08/12/2013  . Pneumococcal Polysaccharide-23 06/23/2013    Social History   Tobacco Use  . Smoking status: Former Games developer  . Smokeless tobacco: Never Used  Substance Use Topics  . Alcohol use: No    Family history is   History reviewed. No pertinent family history.    Review of Systems  DATA OBTAINED: from patient, nurse, medical record, family member GENERAL:  no fevers, fatigue, appetite changes SKIN: No itching, or rash EYES: No eye pain, redness, discharge EARS: No earache, tinnitus, change in hearing NOSE: No congestion, drainage or bleeding  MOUTH/THROAT: No mouth or tooth pain, No sore throat RESPIRATORY: No cough, wheezing, SOB CARDIAC: No chest pain, palpitations, lower extremity edema  GI: No abdominal pain, No N/V/D or constipation, No heartburn or reflux  GU: No dysuria, frequency or urgency, or incontinence  MUSCULOSKELETAL: No unrelieved bone/joint pain NEUROLOGIC: No headache, dizziness or focal weakness PSYCHIATRIC: No c/o anxiety or sadness   Vitals:   11/26/18 0851  BP: (!) 146/72  Pulse: 62  Resp: 18  Temp: 98.5 F (36.9 C)    SpO2 Readings from Last 1 Encounters:  07/21/18 97%   Body mass index is 19.87 kg/m.     Physical Exam  GENERAL APPEARANCE: Alert, conversant,  No acute distress.  SKIN: No diaphoresis rash HEAD: Normocephalic, atraumatic  EYES: Conjunctiva/lids clear. Pupils round, reactive. EOMs intact.  EARS: External exam WNL, canals clear. Hearing grossly normal.  NOSE: No deformity or discharge.  MOUTH/THROAT: Lips w/o lesions  RESPIRATORY: Breathing is even, unlabored. Lung sounds are clear   CARDIOVASCULAR: Heart RRR no murmurs, rubs or gallops. No peripheral edema.   GASTROINTESTINAL: Abdomen is soft, non-tender, not distended w/ normal bowel sounds. GENITOURINARY: Bladder non tender, not distended  MUSCULOSKELETAL: No abnormal joints or musculature NEUROLOGIC:  Cranial nerves 2-12  grossly intact. Moves all extremities  PSYCHIATRIC: Mood and affect appropriate to situation, no behavioral issues  Patient Active Problem List   Diagnosis Date Noted  . Hyperlipidemia associated with type 2 diabetes mellitus (HCC) 06/10/2018  . Lung nodule < 6cm on CT 11/13/2016  . Vitamin D deficiency 04/11/2016  . UTI (urinary tract infection) 03/24/2015  . GERD (gastroesophageal reflux disease) 12/17/2014  . Hypokalemia 12/17/2014  . Depression 11/25/2014  . Iron deficiency anemia 09/03/2014  . Chronic renal disease, stage 2, mildly decreased glomerular filtration rate (GFR) between 60-89 mL/min/1.73 square meter 09/03/2014  . Breast mass, right 12/13/2013  . Lumbago 10/23/2013  . Type 2 diabetes, controlled, with neuropathy (HCC) 10/18/2013  . 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 Diabetes mellitus, controlled (HCC) 07/19/2013  . Chronic diastolic CHF (congestive heart failure) (HCC) 07/19/2013  . Leukocytosis 06/21/2013  . Hypertensive heart disease with congestive heart failure (HCC) 06/21/2013  . Diabetes mellitus due to underlying condition (HCC) 06/21/2013  Labs reviewed: Basic Metabolic Panel:    Component Value Date/Time   NA 143 07/21/2018 1105   NA 141 05/14/2018   K 3.5 07/21/2018 1105   CL 106 07/21/2018 1105   CO2 28 07/21/2018 1105   GLUCOSE 109 (H) 07/21/2018 1105   BUN 17 07/21/2018 1105   BUN 22 (A) 05/14/2018   CREATININE 0.95 07/21/2018 1105   CALCIUM 9.3 07/21/2018 1105   PROT 7.6 07/21/2018 1105   ALBUMIN 4.0 07/21/2018 1105   AST 20 07/21/2018 1105   ALT 13 07/21/2018 1105   ALKPHOS 67 07/21/2018 1105   BILITOT 0.7 07/21/2018 1105   GFRNONAA 55 (L) 07/21/2018 1105   GFRAA >60 07/21/2018 1105    Recent Labs    05/14/18 07/09/18 1949 07/21/18 1105  NA 141 142 143  K 4.6 3.6 3.5  CL  --  102 106  CO2  --  29 28  GLUCOSE  --  120* 109*  BUN 22* 30* 17  CREATININE 1.1 1.47* 0.95  CALCIUM   --  8.8* 9.3   Liver Function Tests: Recent Labs    05/01/18 07/09/18 1949 07/21/18 1105  AST 19 18 20   ALT 16 11 13   ALKPHOS 81 63 67  BILITOT  --  0.5 0.7  PROT  --  6.9 7.6  ALBUMIN  --  3.5 4.0   No results for input(s): LIPASE, AMYLASE in the last 8760 hours. No results for input(s): AMMONIA in the last 8760 hours. CBC: Recent Labs    05/01/18 07/09/18 1949 07/21/18 1105  WBC 5.1 4.4 4.2  HGB 10.6* 11.7* 12.0  HCT 31* 36.7 38.2  MCV  --  93.1 94.6  PLT 201 213 192   Lipid Recent Labs    05/01/18  CHOL 145  HDL 48  LDLCALC 78  TRIG 96    Cardiac Enzymes: No results for input(s): CKTOTAL, CKMB, CKMBINDEX, TROPONINI in the last 8760 hours. BNP: No results for input(s): BNP in the last 8760 hours. No results found for: [facility] Lab Results  Component Value Date   HGBA1C 5.7 05/01/2018   Lab Results  Component Value Date   TSH 0.30 (A) 05/01/2018   No results found for: VITAMINB12 No results found for: FOLATE No results found for: IRON, TIBC, FERRITIN  Imaging and Procedures obtained prior to SNF admission: Dg Chest 2 View  Result Date: 07/21/2018 CLINICAL DATA:  Geriatric psych evaluation EXAM: CHEST - 2 VIEW COMPARISON:  07/18/2013 FINDINGS: Heart size is normal. There is aortic atherosclerosis. No pleural effusion or edema. No airspace opacities identified. Scoliosis deformity of the thoracic spine is convex towards the right. IMPRESSION: 1. No acute cardiopulmonary abnormalities. Electronically Signed   By: Signa Kellaylor  Stroud M.D.   On: 07/21/2018 10:48     Not all labs, radiology exams or other studies done during hospitalization come through on my EPIC note; however they are reviewed by me.    Assessment and Plan  No problem-specific Assessment & Plan notes found for this encounter.

## 2018-11-26 NOTE — Progress Notes (Deleted)
[facility] Room Number: 201/D Place of Service:  SNF (31)  [name], Anne D, MD  Patient Care Team: [name], Anne D, MD as PCP - General (Internal Medicine)  Extended Emergency Contact Information Primary Emergency Contact: Granados,[name] Address: [address]          [place], [place] [address] [place] Home Phone: [phone] Mobile Phone: [phone] Relation: Sister Secondary Emergency Contact: [name],[name] Address: [address]          [place], [place] [address] [place] Home Phone: [phone] Relation: Granddaughter    Allergies: Tequin [gatifloxacin]  Chief Complaint  Patient presents with  . Medical Management of Chronic Issues    Routine visit of medical management  . Best Practice Recommendations    Prevnar-13, T-Dap, Dexa Scan  . Quality Metric Gaps    Microalbumin , Hemoglobin A1C,    HPI: Patient is 85 y.o. female who   Past Medical History:  Diagnosis Date  . Breast mass, right 12/13/2013  . Chronic diastolic CHF (congestive heart failure) (HCC) 07/19/2013  . Chronic diastolic heart failure (HCC)   . Chronic kidney disease, stage II (mild)   . Chronic renal disease, stage 2, mildly decreased glomerular filtration rate (GFR) between 60-89 mL/min/1.73 square meter 09/03/2014  . Closed T12 fracture (HCC) 07/16/2013  . Depression 07/18/2013  . Diabetes mellitus   . Diabetes mellitus due to underlying condition (HCC) 06/21/2013  . Diabetes mellitus without complication (HCC)   . Dysphagia, oropharyngeal phase   . GERD (gastroesophageal reflux disease) 12/17/2014  . Gout   . Hypertension   . Hypertensive heart disease with congestive heart failure (HCC) 06/21/2013  . Iron deficiency anemia 09/03/2014  . Iron deficiency anemia, unspecified   . Leukocytosis 07/18/2013  . Lumbago 10/23/2013  . Lung nodule < 6cm on CT 11/13/2016  . Osteopenia 07/16/2013  . Syncope 06/21/2013  .  Type 2 diabetes, controlled, with neuropathy (HCC) 10/18/2013  . Unspecified constipation 10/18/2013  . Vitamin D deficiency 04/11/2016    Past Surgical History:  Procedure Laterality Date  . NO PAST SURGERIES      Allergies as of 11/26/2018      Reactions   Tequin [gatifloxacin] Other (See Comments)   [facility]      Medication List       Accurate as of November 26, 2018 10:34 AM. If you have any questions, ask your nurse or doctor.        STOP taking these medications   bisacodyl 10 MG suppository Commonly known as:  DULCOLAX Stopped by:  Merrilee SeashoreAnne Alexander, MD   ipratropium-albuterol 0.5-2.5 (3) MG/3ML Soln Commonly known as:  DUONEB Stopped by:  Merrilee SeashoreAnne Alexander, MD   magnesium hydroxide 400 MG/5ML suspension Commonly known as:  MILK OF MAGNESIA Stopped by:  Merrilee SeashoreAnne Alexander, MD   RA SALINE ENEMA RE Stopped by:  Merrilee SeashoreAnne Alexander, MD     TAKE these medications   aspirin 81 MG chewable tablet Chew 1 tablet (81 mg total) by mouth daily.   ferrous sulfate 325 (65 FE) MG tablet Take 325 mg by mouth daily with breakfast.   furosemide 40 MG tablet Commonly known as:  LASIX Take 40 mg by mouth every morning.   gabapentin 300 MG capsule Commonly known as:  NEURONTIN Take 1 capsule (300 mg total) by mouth at bedtime.   Nutritional Supplement Liqd Take 1 each by mouth every evening. "Magic Cup" - Take  one cup with dinner as supplement   potassium chloride 10 MEQ tablet Commonly known as:  K-DUR Take 30 mEq by mouth daily.   traMADol 50 MG tablet Commonly known as:  ULTRAM Take 1 tablet (50 mg total) by mouth 2 (two) times daily.   Vitamin D (Ergocalciferol) 1.25 MG (50000 UT) Caps capsule Commonly known as:  DRISDOL Take 50,000 Units by mouth every Wednesday.       No orders of the defined types were placed in this encounter.   Immunization History  Administered Date(s) Administered  . Influenza-Unspecified 03/23/2015, 04/06/2016, 04/06/2017  . PPD Test  08/12/2013  . Pneumococcal Polysaccharide-23 06/23/2013    Social History   Tobacco Use  . Smoking status: Former Games developer  . Smokeless tobacco: Never Used  Substance Use Topics  . Alcohol use: No    Review of Systems  DATA OBTAINED: from patient, nurse, medical record, family member GENERAL:  no fevers, fatigue, appetite changes SKIN: No itching, rash HEENT: No complaint RESPIRATORY: No cough, wheezing, SOB CARDIAC: No chest pain, palpitations, lower extremity edema  GI: No abdominal pain, No N/V/D or constipation, No heartburn or reflux  GU: No dysuria, frequency or urgency, or incontinence  MUSCULOSKELETAL: No unrelieved bone/joint pain NEUROLOGIC: No headache, dizziness  PSYCHIATRIC: No overt anxiety or sadness  Vitals:   11/26/18 0851  BP: (!) 146/72  Pulse: 62  Resp: 18  Temp: 98.5 F (36.9 C)   Body mass index is 19.87 kg/m. Physical Exam  GENERAL APPEARANCE: Alert, conversant, No acute distress  SKIN: No diaphoresis rash HEENT: Unremarkable RESPIRATORY: Breathing is even, unlabored. Lung sounds are clear   CARDIOVASCULAR: Heart RRR no murmurs, rubs or gallops. No peripheral edema  GASTROINTESTINAL: Abdomen is soft, non-tender, not distended w/ normal bowel sounds.  GENITOURINARY: Bladder non tender, not distended  MUSCULOSKELETAL: No abnormal joints or musculature NEUROLOGIC: Cranial nerves 2-12 grossly intact. Moves all extremities PSYCHIATRIC: Mood and affect appropriate to situation, no behavioral issues  Patient Active Problem List   Diagnosis Date Noted  . Hyperlipidemia associated with type 2 diabetes mellitus (HCC) 06/10/2018  . Lung nodule < 6cm on CT 11/13/2016  . Vitamin D deficiency 04/11/2016  . UTI (urinary tract infection) 03/24/2015  . GERD (gastroesophageal reflux disease) 12/17/2014  . Hypokalemia 12/17/2014  . Depression 11/25/2014  . Iron deficiency anemia 09/03/2014  . Chronic renal disease, stage 2, mildly decreased glomerular  filtration rate (GFR) between 60-89 mL/min/1.73 square meter 09/03/2014  . Breast mass, right 12/13/2013  . Lumbago 10/23/2013  . Type 2 diabetes, controlled, with neuropathy (HCC) 10/18/2013  . 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 Diabetes mellitus, controlled (HCC) 07/19/2013  . Chronic diastolic CHF (congestive heart failure) (HCC) 07/19/2013  . Leukocytosis 06/21/2013  . Hypertensive heart disease with congestive heart failure (HCC) 06/21/2013  . Diabetes mellitus due to underlying condition (HCC) 06/21/2013    CMP     Component Value Date/Time   NA 143 07/21/2018 1105   NA 141 05/14/2018   K 3.5 07/21/2018 1105   CL 106 07/21/2018 1105   CO2 28 07/21/2018 1105   GLUCOSE 109 (H) 07/21/2018 1105   BUN 17 07/21/2018 1105   BUN 22 (A) 05/14/2018   CREATININE 0.95 07/21/2018 1105   CALCIUM 9.3 07/21/2018 1105   PROT 7.6 07/21/2018 1105   ALBUMIN 4.0 07/21/2018 1105   AST 20 07/21/2018 1105   ALT 13 07/21/2018 1105   ALKPHOS 67 07/21/2018 1105  BILITOT 0.7 07/21/2018 1105   GFRNONAA 55 (L) 07/21/2018 1105   GFRAA >60 07/21/2018 1105   Recent Labs    05/14/18 07/09/18 1949 07/21/18 1105  NA 141 142 143  K 4.6 3.6 3.5  CL  --  102 106  CO2  --  29 28  GLUCOSE  --  120* 109*  BUN 22* 30* 17  CREATININE 1.1 1.47* 0.95  CALCIUM  --  8.8* 9.3   Recent Labs    05/01/18 07/09/18 1949 07/21/18 1105  AST ALT ALKPHOS 81 63 67  BILITOT  --  0.5 0.7  PROT  --  6.9 7.6  ALBUMIN  --  3.5 4.0   Recent Labs    05/01/18 07/09/18 1949 07/21/18 1105  WBC 5.1 4.4 4.2  HGB 10.6* 11.7* 12.0  HCT 31* 36.7 38.2  MCV  --  93.1 94.6  PLT 201 213 192   Recent Labs    05/01/18  CHOL 145  LDLCALC 78  TRIG 96   No results found for: MICROALBUR Lab Results  Component Value Date   TSH 0.30 (A) 05/01/2018   Lab Results  Component Value Date   HGBA1C 5.7 05/01/2018   Lab Results  Component Value Date    CHOL 145 05/01/2018   HDL 48 05/01/2018   LDLCALC 78 05/01/2018   TRIG 96 05/01/2018   CHOLHDL 3.2 06/22/2013    Significant Diagnostic Results in last 30 days:  No results found.  Assessment and Plan  No problem-specific Assessment & Plan notes found for this encounter.   Labs/tests ordered:

## 2018-11-28 ENCOUNTER — Encounter: Payer: Self-pay | Admitting: Internal Medicine

## 2018-11-28 NOTE — Assessment & Plan Note (Signed)
GFR is 55 so stage III chronic kidney disease; will monitor intervals

## 2018-11-28 NOTE — Assessment & Plan Note (Signed)
HDL 48, LDL 78 on no medications; at her age I consider this acceptable; will monitor intervals

## 2018-11-28 NOTE — Assessment & Plan Note (Signed)
No reported exacerbations, very stable; continue Lasix 40 mg daily

## 2018-11-29 DIAGNOSIS — E559 Vitamin D deficiency, unspecified: Secondary | ICD-10-CM | POA: Diagnosis not present

## 2018-11-29 DIAGNOSIS — E785 Hyperlipidemia, unspecified: Secondary | ICD-10-CM | POA: Diagnosis not present

## 2018-11-29 DIAGNOSIS — D649 Anemia, unspecified: Secondary | ICD-10-CM | POA: Diagnosis not present

## 2018-11-29 DIAGNOSIS — E119 Type 2 diabetes mellitus without complications: Secondary | ICD-10-CM | POA: Diagnosis not present

## 2018-11-29 DIAGNOSIS — E039 Hypothyroidism, unspecified: Secondary | ICD-10-CM | POA: Diagnosis not present

## 2018-11-29 DIAGNOSIS — I10 Essential (primary) hypertension: Secondary | ICD-10-CM | POA: Diagnosis not present

## 2018-11-29 LAB — CBC AND DIFFERENTIAL
HCT: 34 — AB (ref 36–46)
Hemoglobin: 11.8 — AB (ref 12.0–16.0)
Neutrophils Absolute: 4
Platelets: 202 (ref 150–399)
WBC: 5.5

## 2018-11-29 LAB — TSH: TSH: 0.24 — AB (ref 0.41–5.90)

## 2018-11-29 LAB — HEMOGLOBIN A1C: Hemoglobin A1C: 5.5

## 2018-12-01 DIAGNOSIS — I10 Essential (primary) hypertension: Secondary | ICD-10-CM | POA: Diagnosis not present

## 2018-12-01 LAB — MICROALBUMIN, URINE: Microalb, Ur: 1.2

## 2018-12-03 ENCOUNTER — Encounter: Payer: Self-pay | Admitting: Internal Medicine

## 2018-12-03 ENCOUNTER — Non-Acute Institutional Stay (SKILLED_NURSING_FACILITY): Payer: Medicare Other | Admitting: Internal Medicine

## 2018-12-03 DIAGNOSIS — R45851 Suicidal ideations: Secondary | ICD-10-CM | POA: Diagnosis not present

## 2018-12-03 NOTE — Progress Notes (Signed)
Location:  [facility] Room Number: [place]:  SNF (31)  Hennie [name], MD  Patient Care Team: Hennie [name], MD as PCP - General (Internal Medicine)  Extended Emergency Contact Information Primary Emergency Contact: Scherzer,[name] Address: [address] [place], [place] [address] [place] of [place] Phone: [phone] Mobile Phone: [phone] Relation: Sister Secondary Emergency Contact: [name],[name] Address: [address]          [place], [place] [address] [place] of [place] Phone: [phone] Relation: Granddaughter   Chief Complaint  Patient presents with  . Acute Visit    Suicidal Ideation     HPI:  Pt is a 85 y.o. female seen today because she says that [facility], nursing, is not here and she is going to stab herself.  Patient has threatened this before, broken clock on the wall and was going to stab herself with 1 of the pieces..  Patient has been seen in the ED for same prior and felt not to [place] a danger to herself, because essentially got to the ED she said she wanted to go back to the nursing home.   Past Medical History:  Diagnosis Date  . Breast mass, right 12/13/2013  . Chronic diastolic CHF (congestive heart failure) ([place]) 07/19/2013  . Chronic diastolic heart failure ([place])   . Chronic kidney disease, stage II (mild)   . Chronic renal disease, stage 2, mildly decreased glomerular filtration rate (GFR) between 60-89 mL/min/1.73 square meter 09/03/2014  . Closed T12 fracture ([place]) 07/16/2013  . Depression 07/18/2013  . Diabetes mellitus   . Diabetes mellitus due to underlying condition ([place]) 06/21/2013  . Diabetes mellitus without complication ([place])   . Dysphagia, oropharyngeal phase   . GERD (gastroesophageal reflux disease) 12/17/2014  . Gout   . Hypertension   . Hypertensive heart disease with congestive heart failure ([place]) 06/21/2013  . Iron deficiency anemia 09/03/2014  . Iron  deficiency anemia, unspecified   . Leukocytosis 07/18/2013  . Lumbago 10/23/2013  . Lung nodule < 6cm on CT 11/13/2016  . Osteopenia 07/16/2013  . Syncope 06/21/2013  . Type 2 diabetes, controlled, with neuropathy ([place]) 10/18/2013  . Unspecified constipation 10/18/2013  . Vitamin D deficiency 04/11/2016   Past Surgical History:  Procedure Laterality Date  . NO PAST SURGERIES      Allergies  Allergen Reactions  . Tequin [Gatifloxacin] Other (See Comments)    Unknown rxn per [facility]    Allergies as of 12/03/2018      Reactions   Tequin [gatifloxacin] Other (See Comments)   [facility] rxn [facility]      Medication List       Accurate as of December 03, 2018 11:59 PM. If you have any questions, ask your nurse or doctor.        aspirin 81 MG chewable tablet Chew 1 tablet (81 mg total) by mouth daily.   ferrous sulfate 325 (65 FE) MG tablet Take 325 mg by mouth daily with breakfast.   furosemide 40 MG tablet Commonly known as: LASIX Take 40 mg by mouth every morning.   gabapentin 300 MG capsule Commonly known as: NEURONTIN Take 1 capsule (300 mg total) by mouth at bedtime.   Nutritional Supplement Liqd Take 1 each by mouth every evening. "Magic Cup" - Take one cup with dinner as supplement   potassium chloride 10 MEQ tablet Commonly known as: K-DUR Take  30 mEq by mouth daily.   traMADol 50 MG tablet Commonly known as: ULTRAM Take 1 tablet (50 mg total) by mouth 2 (two) times daily.   Vitamin D (Ergocalciferol) 1.25 MG (50000 UT) Caps capsule Commonly known as: DRISDOL Take 50,000 Units by mouth every Wednesday.       Review of Systems  DATA OBTAINED: from patient, nurse-as per history of present illness GENERAL:  no fevers, fatigue, appetite changes SKIN: No itching, rash HEENT: No complaint RESPIRATORY: No cough, wheezing, SOB CARDIAC: No chest pain, palpitations, lower extremity edema  GI: No abdominal pain, No N/V/D or constipation, No heartburn or reflux  GU: No  dysuria, frequency or urgency, or incontinence  MUSCULOSKELETAL: No unrelieved bone/joint pain NEUROLOGIC: No headache, dizziness  PSYCHIATRIC: Patient wants to stab herself because Kipp BroodBrent is dead   Immunization History  Administered Date(s) Administered  . Influenza-Unspecified 03/23/2015, 04/06/2016, 04/06/2017  . PPD Test 08/12/2013  . Pneumococcal Polysaccharide-23 06/23/2013   Pertinent  Health Maintenance Due  Topic Date Due  . HEMOGLOBIN A1C  10/30/2018  . URINE MICROALBUMIN  12/26/2018 (Originally 06/26/1942)  . DEXA SCAN  06/28/2023 (Originally 06/27/1999)  . PNA vac Low Risk Adult (2 of 2 - PCV13) 06/28/2023 (Originally 06/23/2014)  . FOOT EXAM  01/15/2019  . INFLUENZA VACCINE  01/26/2019  . OPHTHALMOLOGY EXAM  04/03/2019   Fall Risk  12/11/2017 12/12/2016 12/09/2016  Falls in the past year? No Yes Yes  Comment - 10/09/16 -  Number falls in past yr: - - 1  Injury with Fall? - - Yes    Vitals:   12/03/18 1533  BP: 122/75  Pulse: 74  Resp: 18  Temp: 98 F (36.7 C)  TempSrc: Oral  SpO2: 96%  Weight: 91 lb 12.8 oz (41.6 kg)  Height: 4\' 9"  (1.448 m)   Body mass index is 19.87 kg/m.  Physical Exam  GENERAL APPEARANCE: Alert, conversant, No acute distress  SKIN: No diaphoresis rash HEENT: Unremarkable RESPIRATORY: Breathing is even, unlabored. Lung sounds are clear   CARDIOVASCULAR: Heart RRR no murmurs, rubs or gallops. No peripheral edema  GASTROINTESTINAL: Abdomen is soft, non-tender, not distended w/ normal bowel sounds.  GENITOURINARY: Bladder non tender, not distended  MUSCULOSKELETAL: No abnormal joints or musculature NEUROLOGIC: Cranial nerves 2-12 grossly intact. Moves all extremities PSYCHIATRIC: Patient is saying and exhibiting with stabbing relations that she wants to stab herself because the boy Kipp Brood(Brent) is not there  Labs reviewed: Recent Labs    05/14/18 07/09/18 1949 07/21/18 1105  NA 141 142 143  K 4.6 3.6 3.5  CL  --  102 106  CO2  --   29 28  GLUCOSE  --  120* 109*  BUN 22* 30* 17  CREATININE 1.1 1.47* 0.95  CALCIUM  --  8.8* 9.3   Recent Labs    05/01/18 07/09/18 1949 07/21/18 1105  AST 19 18 20   ALT 16 11 13   ALKPHOS 81 63 67  BILITOT  --  0.5 0.7  PROT  --  6.9 7.6  ALBUMIN  --  3.5 4.0   Recent Labs    05/01/18 07/09/18 1949 07/21/18 1105  WBC 5.1 4.4 4.2  HGB 10.6* 11.7* 12.0  HCT 31* 36.7 38.2  MCV  --  93.1 94.6  PLT 201 213 192   Recent Labs    05/01/18  CHOL 145  LDLCALC 78  TRIG 96   No results found for: MICROALBUR Lab Results  Component Value Date   TSH 0.30 (A)  05/01/2018   Lab Results  Component Value Date   HGBA1C 5.7 05/01/2018   Lab Results  Component Value Date   CHOL 145 05/01/2018   HDL 48 05/01/2018   LDLCALC 78 05/01/2018   TRIG 96 05/01/2018   CHOLHDL 3.2 06/22/2013    Significant Diagnostic Results in last 30 days:  No results found.  Assessment/Plan Suicidal ideation All suicidal ideation is taken seriously, but a do not think that this patient is going to [place] actually capable of harming herself.  The last time she had a chance with the pieces of the broken clock she did not have even a scratch on her.  I think this is in response to LaCosteBrent, her favorite nurse, not being at the SNF today.  Patient I believe will [place] safe in the SNF with frequent checks.  The SNF administrator is checking with risk management to see if we need to send this patient to the emergency department.    Randon GoldsmithAnne D. Lyn HollingsheadAlexander, MD

## 2018-12-04 DIAGNOSIS — I10 Essential (primary) hypertension: Secondary | ICD-10-CM | POA: Diagnosis not present

## 2018-12-05 DIAGNOSIS — F430 Acute stress reaction: Secondary | ICD-10-CM | POA: Diagnosis not present

## 2018-12-05 DIAGNOSIS — F411 Generalized anxiety disorder: Secondary | ICD-10-CM | POA: Diagnosis not present

## 2018-12-08 DIAGNOSIS — R45851 Suicidal ideations: Secondary | ICD-10-CM | POA: Insufficient documentation

## 2018-12-08 NOTE — Assessment & Plan Note (Signed)
All suicidal ideation is taken seriously, but a do not think that this patient is going to [name] actually capable of harming herself.  The last time she had a chance with the pieces of the broken clock she did not have even a scratch on her.  I think this is in response to Margaret [name], her favorite nurse, not being at the SNF today.  Patient I believe will Margaret [name] safe in the SNF with frequent checks.  The SNF administrator is checking with risk management to see if we need to send this patient to the emergency department.

## 2018-12-21 DIAGNOSIS — Z20828 Contact with and (suspected) exposure to other viral communicable diseases: Secondary | ICD-10-CM | POA: Diagnosis not present

## 2018-12-26 ENCOUNTER — Non-Acute Institutional Stay (SKILLED_NURSING_FACILITY): Payer: Medicare Other | Admitting: Internal Medicine

## 2018-12-26 ENCOUNTER — Encounter: Payer: Self-pay | Admitting: Internal Medicine

## 2018-12-26 DIAGNOSIS — E559 Vitamin D deficiency, unspecified: Secondary | ICD-10-CM

## 2018-12-26 DIAGNOSIS — D508 Other iron deficiency anemias: Secondary | ICD-10-CM

## 2018-12-26 DIAGNOSIS — E1140 Type 2 diabetes mellitus with diabetic neuropathy, unspecified: Secondary | ICD-10-CM

## 2018-12-26 NOTE — Progress Notes (Signed)
Location:  [facility] Room Number: 201-D Place of Service:  SNF (31)  Margaret [name], MD  Patient Care Team: Margaret [name], MD as PCP - General (Internal Medicine)  Extended Emergency Contact Information Primary Emergency Contact: Margaret,[name] Address: [address] [place], [place] [address] Margaret [name] of [place] Phone: [phone] Mobile Phone: [phone] Relation: Sister Secondary Emergency Contact: Margaret,[name] Address: [address]          [place], [place] [address] Margaret [name] of [place] Phone: [phone] Relation: Granddaughter    Allergies: Tequin [gatifloxacin]  Chief Complaint  Patient presents with  . Medical Management of Chronic Issues    Routine [facility] visit  . Quality Metric Gaps    Hgb A1c    HPI: Patient is an 85 y.o. female who is being seen for routine issues of diabetes mellitus, iron deficiency, and vitamin D deficiency  Past Medical History:  Diagnosis Date  . Breast mass, right 12/13/2013  . Chronic diastolic CHF (congestive heart failure) ([place]) 07/19/2013  . Chronic diastolic heart failure ([place])   . Chronic kidney disease, stage II (mild)   . Chronic renal disease, stage 2, mildly decreased glomerular filtration rate (GFR) between 60-89 mL/min/1.73 square meter 09/03/2014  . Closed T12 fracture ([place]) 07/16/2013  . Depression 07/18/2013  . Diabetes mellitus   . Diabetes mellitus due to underlying condition ([place]) 06/21/2013  . Diabetes mellitus without complication ([place])   . Dysphagia, oropharyngeal phase   . GERD (gastroesophageal reflux disease) 12/17/2014  . Gout   . Hypertension   . Hypertensive heart disease with congestive heart failure ([place]) 06/21/2013  . Iron deficiency anemia 09/03/2014  . Iron deficiency anemia, unspecified   . Leukocytosis 07/18/2013  . Lumbago 10/23/2013  . Lung nodule < 6cm on CT 11/13/2016  . Osteopenia 07/16/2013  . Syncope 06/21/2013   . Type 2 diabetes, controlled, with neuropathy ([place]) 10/18/2013  . Unspecified constipation 10/18/2013  . Vitamin D deficiency 04/11/2016    Past Surgical History:  Procedure Laterality Date  . NO PAST SURGERIES      Allergies as of 12/26/2018      Reactions   Tequin [gatifloxacin] Other (See Comments)   Unknown rxn per [facility]      Medication List       Accurate as of December 26, 2018 11:59 PM. If you have any questions, ask your nurse or doctor.        STOP taking these medications   Vitamin D (Ergocalciferol) 1.25 MG (50000 UT) Caps capsule Commonly known as: DRISDOL Stopped by: Inocencio Homes, MD     TAKE these medications   aspirin 81 MG chewable tablet Chew 1 tablet (81 mg total) by mouth daily.   ferrous sulfate 325 (65 FE) MG tablet Take 325 mg by mouth daily with breakfast.   furosemide 40 MG tablet Commonly known as: LASIX Take 40 mg by mouth every morning.   gabapentin 300 MG capsule Commonly known as: NEURONTIN Take 1 capsule (300 mg total) by mouth at bedtime.   Nutritional Supplement Liqd Take 1 each by mouth every evening. "Magic Cup" - Take one cup with dinner as supplement   potassium chloride 10 MEQ tablet Commonly known as: K-DUR Take 30 mEq by mouth daily. 3 tabs to = 30 mEq   traMADol 50 MG tablet Commonly known as: ULTRAM Take 1 tablet (50 mg total) by mouth 2 (  two) times daily.   Vitamin D3 1.25 MG (50000 UT) Caps Take 1 capsule by mouth once a week.       No orders of the defined types were placed in this encounter.   Immunization History  Administered Date(s) Administered  . Influenza-Unspecified 03/23/2015, 04/06/2016, 04/06/2017  . PPD Test 08/12/2013  . Pneumococcal Polysaccharide-23 06/23/2013    Social History   Tobacco Use  . Smoking status: Former Games developermoker  . Smokeless tobacco: Never Used  Substance Use Topics  . Alcohol use: No    Review of Systems  DATA OBTAINED: from nurse GENERAL:  no fevers, fatigue, appetite  changes SKIN: No itching, rash HEENT: No complaint RESPIRATORY: No cough, wheezing, SOB CARDIAC: No chest pain, palpitations, lower extremity edema  GI: No abdominal pain, No N/V/D or constipation, No heartburn or reflux  GU: No dysuria, frequency or urgency, or incontinence  MUSCULOSKELETAL: No unrelieved bone/joint pain NEUROLOGIC: No headache, dizziness  PSYCHIATRIC: No overt anxiety or sadness  Vitals:   12/26/18 1510  BP: 118/65  Pulse: 75  Resp: 18  Temp: 98.8 F (37.1 C)   Body mass index is 19.52 kg/m. Physical Exam  GENERAL APPEARANCE: Alert, minimally conversant, No acute distress  SKIN: No diaphoresis rash HEENT: Unremarkable RESPIRATORY: Breathing is even, unlabored. Lung sounds are clear   CARDIOVASCULAR: Heart RRR no murmurs, rubs or gallops. No peripheral edema  GASTROINTESTINAL: Abdomen is soft, non-tender, not distended w/ normal bowel sounds.  GENITOURINARY: Bladder non tender, not distended  MUSCULOSKELETAL: No abnormal joints or musculature NEUROLOGIC: Cranial nerves 2-12 grossly intact. Moves all extremities PSYCHIATRIC: Mood and affect dementia, no behavioral issues  Patient Active Problem List   Diagnosis Date Noted  . Suicidal ideation 12/08/2018  . Hyperlipidemia associated with type 2 diabetes mellitus (HCC) 06/10/2018  . Lung nodule < 6cm on CT 11/13/2016  . Vitamin D deficiency 04/11/2016  . UTI (urinary tract infection) 03/24/2015  . GERD (gastroesophageal reflux disease) 12/17/2014  . Hypokalemia 12/17/2014  . Depression 11/25/2014  . Iron deficiency anemia 09/03/2014  . Chronic renal disease, stage 2, mildly decreased glomerular filtration rate (GFR) between 60-89 mL/min/1.73 square meter 09/03/2014  . Breast mass, right 12/13/2013  . Lumbago 10/23/2013  . Type 2 diabetes, controlled, with neuropathy (HCC) 10/18/2013  . 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 Diabetes mellitus, controlled (HCC)  07/19/2013  . Chronic diastolic CHF (congestive heart failure) (HCC) 07/19/2013  . Leukocytosis 06/21/2013  . Hypertensive heart disease with congestive heart failure (HCC) 06/21/2013  . Diabetes mellitus due to underlying condition (HCC) 06/21/2013    CMP     Component Value Date/Time   NA 143 07/21/2018 1105   NA 141 05/14/2018   K 3.5 07/21/2018 1105   CL 106 07/21/2018 1105   CO2 28 07/21/2018 1105   GLUCOSE 109 (H) 07/21/2018 1105   BUN 17 07/21/2018 1105   BUN 22 (A) 05/14/2018   CREATININE 0.95 07/21/2018 1105   CALCIUM 9.3 07/21/2018 1105   PROT 7.6 07/21/2018 1105   ALBUMIN 4.0 07/21/2018 1105   AST 20 07/21/2018 1105   ALT 13 07/21/2018 1105   ALKPHOS 67 07/21/2018 1105   BILITOT 0.7 07/21/2018 1105   GFRNONAA 55 (L) 07/21/2018 1105   GFRAA >60 07/21/2018 1105   Recent Labs    05/14/18 07/09/18 1949 07/21/18 1105  NA 141 142 143  K 4.6 3.6 3.5  CL  --  102 106  CO2  --  29  28  GLUCOSE  --  120* 109*  BUN 22* 30* 17  CREATININE 1.1 1.47* 0.95  CALCIUM  --  8.8* 9.3   Recent Labs    05/01/18 07/09/18 1949 07/21/18 1105  AST 19 18 20   ALT 16 11 13   ALKPHOS 81 63 67  BILITOT  --  0.5 0.7  PROT  --  6.9 7.6  ALBUMIN  --  3.5 4.0   Recent Labs    05/01/18 07/09/18 1949 07/21/18 1105  WBC 5.1 4.4 4.2  HGB 10.6* 11.7* 12.0  HCT 31* 36.7 38.2  MCV  --  93.1 94.6  PLT 201 213 192   Recent Labs    05/01/18  CHOL 145  LDLCALC 78  TRIG 96   No results found for: MICROALBUR Lab Results  Component Value Date   TSH 0.30 (A) 05/01/2018   Lab Results  Component Value Date   HGBA1C 5.7 05/01/2018   Lab Results  Component Value Date   CHOL 145 05/01/2018   HDL 48 05/01/2018   LDLCALC 78 05/01/2018   TRIG 96 05/01/2018   CHOLHDL 3.2 06/22/2013    Significant Diagnostic Results in last 30 days:  No results found.  Assessment and Plan  Type 2 diabetes, controlled, with neuropathy A1c is stable 5.7; continue Glucophage 500 mg every  morning patient is not on statin and not on Arbor or ACE  Iron deficiency anemia Stable hemoglobin; continue iron 325 mg daily  Vitamin D deficiency Patient is due for vitamin D level; in the meantime we will continue 50,000 units weekly    Margit HanksAnne D Fronia Depass, MD

## 2018-12-29 ENCOUNTER — Encounter: Payer: Self-pay | Admitting: Internal Medicine

## 2018-12-29 NOTE — Assessment & Plan Note (Signed)
Patient is due for vitamin D level; in the meantime we will continue 50,000 units weekly

## 2018-12-29 NOTE — Assessment & Plan Note (Signed)
Stable hemoglobin; continue iron 325 mg daily

## 2018-12-29 NOTE — Assessment & Plan Note (Signed)
A1c is stable 5.7; continue Glucophage 500 mg every morning patient is not on statin and not on Arbor or ACE

## 2019-01-02 DIAGNOSIS — E119 Type 2 diabetes mellitus without complications: Secondary | ICD-10-CM | POA: Diagnosis not present

## 2019-01-02 LAB — HEMOGLOBIN A1C: Hemoglobin A1C: 5.5

## 2019-01-03 ENCOUNTER — Other Ambulatory Visit: Payer: Self-pay | Admitting: Internal Medicine

## 2019-01-03 MED ORDER — TRAMADOL HCL 50 MG PO TABS: 50.0000 mg | ORAL_TABLET | Freq: Two times a day (BID) | ORAL | 0 refills | Status: DC

## 2019-01-09 DIAGNOSIS — G3184 Mild cognitive impairment, so stated: Secondary | ICD-10-CM | POA: Diagnosis not present

## 2019-01-11 DIAGNOSIS — J069 Acute upper respiratory infection, unspecified: Secondary | ICD-10-CM | POA: Diagnosis not present

## 2019-01-14 ENCOUNTER — Non-Acute Institutional Stay (SKILLED_NURSING_FACILITY): Payer: Medicare Other | Admitting: Internal Medicine

## 2019-01-14 ENCOUNTER — Encounter: Payer: Self-pay | Admitting: Internal Medicine

## 2019-01-14 DIAGNOSIS — U071 COVID-19: Secondary | ICD-10-CM

## 2019-01-14 DIAGNOSIS — J069 Acute upper respiratory infection, unspecified: Secondary | ICD-10-CM | POA: Diagnosis not present

## 2019-01-14 NOTE — Progress Notes (Signed)
Location:  [facility] Room Number: 424-D Place of Service:  SNF (31)  Hennie [name], MD  Patient Care Team: Hennie [name], MD as PCP - General (Internal Medicine)  Extended Emergency Contact Information Primary Emergency Contact: Gautney,[name] Address: [address] [place], [place] [address] Johnnette Litter of [place] Phone: [phone] Mobile Phone: [phone] Relation: Sister Secondary Emergency Contact: [name],[name] Address: [address]          [place], [place] [address] Johnnette Litter of [place] Phone: [phone] Relation: Granddaughter    Allergies: Tequin [gatifloxacin]  Chief Complaint  Patient presents with  . Acute Visit    Patient is COVID-19 positive    HPI: Patient is an 85 y.o. female who is being seen because her cover test came back positive today.  Patient has not had any fever chills nausea vomiting diarrhea sore throat sinus infection loss of taste or smell chest pain shortness of breath or any other systemic or COVID symptom.  Past Medical History:  Diagnosis Date  . Breast mass, right 12/13/2013  . Chronic diastolic CHF (congestive heart failure) ([place]) 07/19/2013  . Chronic diastolic heart failure ([place])   . Chronic kidney disease, stage II (mild)   . Chronic renal disease, stage 2, mildly decreased glomerular filtration rate (GFR) between 60-89 mL/min/1.73 square meter 09/03/2014  . Closed T12 fracture ([place]) 07/16/2013  . Depression 07/18/2013  . Diabetes mellitus   . Diabetes mellitus due to underlying condition ([place]) 06/21/2013  . Diabetes mellitus without complication ([place])   . Dysphagia, oropharyngeal phase   . GERD (gastroesophageal reflux disease) 12/17/2014  . Gout   . Hypertension   . Hypertensive heart disease with congestive heart failure ([place]) 06/21/2013  . Iron deficiency anemia 09/03/2014  . Iron deficiency anemia, unspecified   . Leukocytosis 07/18/2013  . Lumbago  10/23/2013  . Lung nodule < 6cm on CT 11/13/2016  . Osteopenia 07/16/2013  . Syncope 06/21/2013  . Type 2 diabetes, controlled, with neuropathy ([place]) 10/18/2013  . Unspecified constipation 10/18/2013  . Vitamin D deficiency 04/11/2016    Past Surgical History:  Procedure Laterality Date  . NO PAST SURGERIES      Allergies as of 01/14/2019      Reactions   Tequin [gatifloxacin] Other (See Comments)   Unknown rxn per [facility]      Medication List       Accurate as of January 14, 2019 11:59 PM. If you have any questions, ask your nurse or doctor.        aspirin 81 MG chewable tablet Chew 1 tablet (81 mg total) by mouth daily.   ferrous sulfate 325 (65 FE) MG tablet Take 325 mg by mouth daily with breakfast.   furosemide 40 MG tablet Commonly known as: LASIX Take 40 mg by mouth every morning.   gabapentin 300 MG capsule Commonly known as: NEURONTIN Take 1 capsule (300 mg total) by mouth at bedtime.   Nutritional Supplement Liqd Take 1 each by mouth every evening. "Magic Cup" - Take one cup with dinner as supplement   potassium chloride 10 MEQ tablet Commonly known as: K-DUR Take 30 mEq by mouth daily. 3 tabs to = 30 mEq   traMADol 50 MG tablet Commonly known as: ULTRAM Take 1 tablet (50 mg total) by mouth 2 (two) times daily.   Vitamin D3 1.25 MG (50000 UT) Caps Take 1 capsule by mouth once a  week.       No orders of the defined types were placed in this encounter.   Immunization History  Administered Date(s) Administered  . Influenza-Unspecified 03/23/2015, 04/06/2016, 04/06/2017  . PPD Test 08/12/2013  . Pneumococcal Polysaccharide-23 06/23/2013    Social History   Tobacco Use  . Smoking status: Former Games developermoker  . Smokeless tobacco: Never Used  Substance Use Topics  . Alcohol use: No    Review of Systems  DATA OBTAINED: from nurse GENERAL:  no fevers, fatigue, appetite changes SKIN: No itching, rash HEENT: No complaint RESPIRATORY: No cough, wheezing,  SOB CARDIAC: No chest pain, palpitations, lower extremity edema  GI: No abdominal pain, No N/V/D or constipation, No heartburn or reflux  GU: No dysuria, frequency or urgency, or incontinence  MUSCULOSKELETAL: No unrelieved bone/joint pain NEUROLOGIC: No headache, dizziness  PSYCHIATRIC: No overt anxiety or sadness  Vitals:   01/14/19 1451  BP: 119/77  Pulse: 87  Resp: 18  Temp: 98.1 F (36.7 C)   Body mass index is 19 kg/m. Physical Exam  GENERAL APPEARANCE: Alert,  No acute distress, looks bored SKIN: No diaphoresis rash HEENT: Unremarkable RESPIRATORY: Breathing is even, unlabored. Lung sounds are clear   CARDIOVASCULAR: Heart RRR no murmurs, rubs or gallops. No peripheral edema  GASTROINTESTINAL: Abdomen is soft, non-tender, not distended w/ normal bowel sounds.  GENITOURINARY: Bladder non tender, not distended  MUSCULOSKELETAL: No abnormal joints or musculature NEUROLOGIC: Cranial nerves 2-12 grossly intact. Moves all extremities PSYCHIATRIC: Mood and affect with dementia, no behavioral issues  Patient Active Problem List   Diagnosis Date Noted  . Real time reverse transcriptase PCR positive for COVID-19 virus 01/16/2019  . Suicidal ideation 12/08/2018  . Hyperlipidemia associated with type 2 diabetes mellitus (HCC) 06/10/2018  . Lung nodule < 6cm on CT 11/13/2016  . Vitamin D deficiency 04/11/2016  . UTI (urinary tract infection) 03/24/2015  . GERD (gastroesophageal reflux disease) 12/17/2014  . Hypokalemia 12/17/2014  . Depression 11/25/2014  . Iron deficiency anemia 09/03/2014  . Chronic renal disease, stage 2, mildly decreased glomerular filtration rate (GFR) between 60-89 mL/min/1.73 square meter 09/03/2014  . Breast mass, right 12/13/2013  . Lumbago 10/23/2013  . Type 2 diabetes, controlled, with neuropathy (HCC) 10/18/2013  . 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 Diabetes mellitus, controlled (HCC) 07/19/2013  .  Chronic diastolic CHF (congestive heart failure) (HCC) 07/19/2013  . Leukocytosis 06/21/2013  . Hypertensive heart disease with congestive heart failure (HCC) 06/21/2013  . Diabetes mellitus due to underlying condition (HCC) 06/21/2013    CMP     Component Value Date/Time   NA 143 07/21/2018 1105   NA 141 05/14/2018   K 3.5 07/21/2018 1105   CL 106 07/21/2018 1105   CO2 28 07/21/2018 1105   GLUCOSE 109 (H) 07/21/2018 1105   BUN 17 07/21/2018 1105   BUN 22 (A) 05/14/2018   CREATININE 0.95 07/21/2018 1105   CALCIUM 9.3 07/21/2018 1105   PROT 7.6 07/21/2018 1105   ALBUMIN 4.0 07/21/2018 1105   AST 20 07/21/2018 1105   ALT 13 07/21/2018 1105   ALKPHOS 67 07/21/2018 1105   BILITOT 0.7 07/21/2018 1105   GFRNONAA 55 (L) 07/21/2018 1105   GFRAA >60 07/21/2018 1105   Recent Labs    05/14/18 07/09/18 1949 07/21/18 1105  NA 141 142 143  K 4.6 3.6 3.5  CL  --  102 106  CO2  --  29 28  GLUCOSE  --  120* 109*  BUN 22* 30* 17  CREATININE 1.1 1.47* 0.95  CALCIUM  --  8.8* 9.3   Recent Labs    05/01/18 07/09/18 1949 07/21/18 1105  AST 19 18 20   ALT 16 11 13   ALKPHOS 81 63 67  BILITOT  --  0.5 0.7  PROT  --  6.9 7.6  ALBUMIN  --  3.5 4.0   Recent Labs    05/01/18 07/09/18 1949 07/21/18 1105  WBC 5.1 4.4 4.2  HGB 10.6* 11.7* 12.0  HCT 31* 36.7 38.2  MCV  --  93.1 94.6  PLT 201 213 192   Recent Labs    05/01/18  CHOL 145  LDLCALC 78  TRIG 96   Lab Results  Component Value Date   MICROALBUR 1.2 12/01/2018   Lab Results  Component Value Date   TSH 0.30 (A) 05/01/2018   Lab Results  Component Value Date   HGBA1C 5.7 05/01/2018   Lab Results  Component Value Date   CHOL 145 05/01/2018   HDL 48 05/01/2018   LDLCALC 78 05/01/2018   TRIG 96 05/01/2018   CHOLHDL 3.2 06/22/2013    Significant Diagnostic Results in last 30 days:  No results found.  Assessment and Plan  Real time reverse transcriptase PCR positive for COVID-19 virus Patient is  asymptomatic, however she has been moved to the isolation ward and requires full PPE for evaluations.  Patient will Carie placed on the "COVID cocktail" which includes vitamin D, vitamin C zinc and low-dose aspirin; will monitor closely     Margit HanksAnne D Jamilex Bohnsack, MD

## 2019-01-16 ENCOUNTER — Encounter: Payer: Self-pay | Admitting: Internal Medicine

## 2019-01-16 DIAGNOSIS — U071 COVID-19: Secondary | ICD-10-CM | POA: Insufficient documentation

## 2019-01-16 NOTE — Assessment & Plan Note (Signed)
Patient is asymptomatic, however she has been moved to the isolation ward and requires full PPE for evaluations.  Patient will Margaret [name] placed on the "COVID cocktail" which includes vitamin D, vitamin C zinc and low-dose aspirin; will monitor closely

## 2019-01-28 ENCOUNTER — Other Ambulatory Visit: Payer: Self-pay

## 2019-01-29 ENCOUNTER — Encounter: Payer: Self-pay | Admitting: Internal Medicine

## 2019-01-29 ENCOUNTER — Non-Acute Institutional Stay (SKILLED_NURSING_FACILITY): Payer: Medicare Other | Admitting: Internal Medicine

## 2019-01-29 DIAGNOSIS — I110 Hypertensive heart disease with heart failure: Secondary | ICD-10-CM | POA: Diagnosis not present

## 2019-01-29 DIAGNOSIS — U071 COVID-19: Secondary | ICD-10-CM

## 2019-01-29 DIAGNOSIS — I5032 Chronic diastolic (congestive) heart failure: Secondary | ICD-10-CM

## 2019-01-29 DIAGNOSIS — E1142 Type 2 diabetes mellitus with diabetic polyneuropathy: Secondary | ICD-10-CM

## 2019-01-29 DIAGNOSIS — K219 Gastro-esophageal reflux disease without esophagitis: Secondary | ICD-10-CM

## 2019-01-29 NOTE — Progress Notes (Signed)
[facility] Room Number: 424-D Place of Service:  SNF (31)  [name], Anne D, MD  Patient Care Team: [name], Anne D, MD as PCP - General (Internal Medicine)  Extended Emergency Contact Information Primary Emergency Contact: Rantz,[name] Address: [address]          [place], [place] [address] [place] Home Phone: [phone] Mobile Phone: [phone] Relation: Sister Secondary Emergency Contact: [name],[name] Address: [address]          [place], [place] [address] [place] Home Phone: [phone] Relation: Granddaughter    Allergies: Tequin [gatifloxacin]  Chief Complaint  Patient presents with  . Medical Management of Chronic Issues    Routine [facility] visit    HPI: Patient is a 85 y.o. female who has been COVID-19 positive, has been in the isolation unit and has been asymptomatic but who is being seen for routine issues of hypertension, GERD, and diabetes mellitus type 2.  Past Medical History:  Diagnosis Date  . Breast mass, right 12/13/2013  . Chronic diastolic CHF (congestive heart failure) (HCC) 07/19/2013  . Chronic diastolic heart failure (HCC)   . Chronic kidney disease, stage II (mild)   . Chronic renal disease, stage 2, mildly decreased glomerular filtration rate (GFR) between 60-89 mL/min/1.73 square meter 09/03/2014  . Closed T12 fracture (HCC) 07/16/2013  . Depression 07/18/2013  . Diabetes mellitus   . Diabetes mellitus due to underlying condition (HCC) 06/21/2013  . Diabetes mellitus without complication (HCC)   . Dysphagia, oropharyngeal phase   . GERD (gastroesophageal reflux disease) 12/17/2014  . Gout   . Hypertension   . Hypertensive heart disease with congestive heart failure (HCC) 06/21/2013  . Iron deficiency anemia 09/03/2014  . Iron deficiency anemia, unspecified   . Leukocytosis 07/18/2013  . Lumbago 10/23/2013  . Lung nodule < 6cm on CT 11/13/2016  .  Osteopenia 07/16/2013  . Syncope 06/21/2013  . Type 2 diabetes, controlled, with neuropathy (HCC) 10/18/2013  . Unspecified constipation 10/18/2013  . Vitamin D deficiency 04/11/2016    Past Surgical History:  Procedure Laterality Date  . NO PAST SURGERIES      Allergies as of 01/29/2019      Reactions   Tequin [gatifloxacin] Other (See Comments)   [facility]      Medication List       Accurate as of January 29, 2019 11:59 PM. If you have any questions, ask your nurse or doctor.        aspirin 81 MG chewable tablet Chew 1 tablet (81 mg total) by mouth daily.   ferrous sulfate 325 (65 FE) MG tablet Take 325 mg by mouth daily with breakfast.   furosemide 40 MG tablet Commonly known as: LASIX Take 40 mg by mouth every morning.   gabapentin 300 MG capsule Commonly known as: NEURONTIN Take 1 capsule (300 mg total) by mouth at bedtime.   Nutritional Supplement Liqd Take 1 each by mouth every evening. "Magic Cup" - Take one cup with dinner as supplement   potassium chloride 10 MEQ tablet Commonly known as: K-DUR Take 30 mEq by mouth daily. 3 tabs to = 30 mEq   traMADol 50 MG tablet Commonly known as: ULTRAM Take 1 tablet (50 mg total) by mouth 2 (two) times daily.   vitamin C 1000 MG tablet Take 1,000 mg by mouth daily.   Vitamin D3 1.25 MG (50000 UT) Caps Take 1 capsule by  mouth once a week.   Zinc Sulfate 220 (50 Zn) MG Tabs Take 1 tablet by mouth daily.       No orders of the defined types were placed in this encounter.   Immunization History  Administered Date(s) Administered  . Influenza-Unspecified 04/06/2016, 04/06/2017, 03/13/2018  . PPD Test 08/12/2013  . Pneumococcal Polysaccharide-23 06/23/2013    Social History   Tobacco Use  . Smoking status: Former Research scientist (life sciences)  . Smokeless tobacco: Never Used  Substance Use Topics  . Alcohol use: No    Review of Systems  DATA OBTAINED: from patient-very limited; nursing-no acute concerns  GENERAL:  no fevers, fatigue, appetite changes SKIN: No itching, rash HEENT: No complaint RESPIRATORY: No cough, wheezing, SOB CARDIAC: No chest pain, palpitations, lower extremity edema  GI: No abdominal pain, No N/V/D or constipation, No heartburn or reflux  GU: No dysuria, frequency or urgency, or incontinence  MUSCULOSKELETAL: No unrelieved bone/joint pain NEUROLOGIC: No headache, dizziness  PSYCHIATRIC: No overt anxiety or sadness  Vitals:   01/29/19 0953  BP: 107/70  Pulse: 77  Resp: 18  Temp: (!) 97.2 F (36.2 C)   Body mass index is 18.83 kg/m. Physical Exam  GENERAL APPEARANCE: Alert, minimally conversant, No acute distress  SKIN: No diaphoresis rash HEENT: Unremarkable RESPIRATORY: Breathing is even, unlabored. Lung sounds are clear   CARDIOVASCULAR: Heart RRR no murmurs, rubs or gallops. No peripheral edema  GASTROINTESTINAL: Abdomen is soft, non-tender, not distended w/ normal bowel sounds.  GENITOURINARY: Bladder non tender, not distended  MUSCULOSKELETAL: No abnormal joints or musculature NEUROLOGIC: Cranial nerves 2-12 grossly intact. Moves all extremities PSYCHIATRIC: Mood and affect with dementia,, occasional behavioral issues  Patient Active Problem List   Diagnosis Date Noted  . Real time reverse transcriptase PCR positive for COVID-19 virus 01/16/2019  . Suicidal ideation 12/08/2018  . Hyperlipidemia associated with type 2 diabetes mellitus ([place]) 06/10/2018  . Lung nodule < 6cm on CT 11/13/2016  . Vitamin D deficiency 04/11/2016  . UTI (urinary tract infection) 03/24/2015  . GERD (gastroesophageal reflux disease) 12/17/2014  . Hypokalemia 12/17/2014  . Depression 11/25/2014  . Iron deficiency anemia 09/03/2014  . Chronic renal disease, stage 2, mildly decreased glomerular filtration rate (GFR) between 60-89 mL/min/1.73 square meter 09/03/2014  . Breast mass, right 12/13/2013  . Lumbago 10/23/2013  . Type 2 diabetes, controlled, with neuropathy  ([place]) 10/18/2013  . Peripheral sensory neuropathy due to type 2 diabetes mellitus ([place]) 10/18/2013  . Diabetes mellitus, controlled ([place]) 07/19/2013  . Chronic diastolic CHF (congestive heart failure) ([place]) 07/19/2013  . Leukocytosis 06/21/2013  . Hypertensive heart disease with congestive heart failure ([place]) 06/21/2013  . Diabetes mellitus due to underlying condition ([place]) 06/21/2013    CMP     Component Value Date/Time   NA 143 07/21/2018 1105   NA 141 05/14/2018   K 3.5 07/21/2018 1105   CL 106 07/21/2018 1105   CO2 28 07/21/2018 1105   GLUCOSE 109 (H) 07/21/2018 1105   BUN 17 07/21/2018 1105   BUN 22 (A) 05/14/2018   CREATININE 0.95 07/21/2018 1105   CALCIUM 9.3 07/21/2018 1105   PROT 7.6 07/21/2018 1105   ALBUMIN 4.0 07/21/2018 1105   AST 20 07/21/2018 1105   ALT 13 07/21/2018 1105   ALKPHOS 67 07/21/2018 1105   BILITOT 0.7 07/21/2018 1105   GFRNONAA 55 (L) 07/21/2018 1105   GFRAA >60 07/21/2018 1105   Recent Labs    05/14/18 07/09/18 1949 07/21/18 1105  NA 141 142 143  K 4.6 3.6 3.5  CL  --  102 106  CO2  --  29 28  GLUCOSE  --  120* 109*  BUN 22* 30* 17  CREATININE 1.1 1.47* 0.95  CALCIUM  --  8.8* 9.3   Recent Labs    05/01/18 07/09/18 1949 07/21/18 1105  AST 19 18 20   ALT 16 11 13   ALKPHOS 81 63 67  BILITOT  --  0.5 0.7  PROT  --  6.9 7.6  ALBUMIN  --  3.5 4.0   Recent Labs    07/09/18 1949 07/21/18 1105 11/29/18  WBC 4.4 4.2 5.5  NEUTROABS  --   --  4  HGB 11.7* 12.0 11.8*  HCT 36.7 38.2 34*  MCV 93.1 94.6  --   PLT 213 192 202   Recent Labs    05/01/18  CHOL 145  LDLCALC 78  TRIG 96   Lab Results  Component Value Date   MICROALBUR 1.2 12/01/2018   Lab Results  Component Value Date   TSH 0.24 (A) 11/29/2018   Lab Results  Component Value Date   HGBA1C 5.5 11/29/2018   Lab Results  Component Value Date   CHOL 145 05/01/2018   HDL 48 05/01/2018   LDLCALC 78 05/01/2018   TRIG 96 05/01/2018   CHOLHDL 3.2 06/22/2013     Significant Diagnostic Results in last 30 days:  No results found.  Assessment and Plan  Hypertensive heart disease with congestive heart failure Continues stable; continue Lasix 40 mg daily  GERD (gastroesophageal reflux disease) No reports of reflux or aspiration; continue Prilosec 20 mg daily  Diabetes mellitus, controlled Most recent A1c is 5.5 which is excellent on Glucophage PAX 500 mg daily; patient is no longer on cholesterol meds or ACE/R; well controlled on no statins  COVID-19 positive- asymptomatic, recovered.     Margit HanksAnne D Alexander, MD

## 2019-02-02 ENCOUNTER — Encounter: Payer: Self-pay | Admitting: Internal Medicine

## 2019-02-02 NOTE — Assessment & Plan Note (Signed)
Most recent A1c is 5.5 which is excellent on Glucophage PAX 500 mg daily; patient is no longer on cholesterol meds or ACE/R; well controlled on no statins

## 2019-02-02 NOTE — Assessment & Plan Note (Signed)
No reports of reflux or aspiration; continue Prilosec 20 mg daily 

## 2019-02-02 NOTE — Assessment & Plan Note (Signed)
Continues stable; continue Lasix 40 mg daily

## 2019-02-06 DIAGNOSIS — R1312 Dysphagia, oropharyngeal phase: Secondary | ICD-10-CM | POA: Diagnosis not present

## 2019-02-06 DIAGNOSIS — I110 Hypertensive heart disease with heart failure: Secondary | ICD-10-CM | POA: Diagnosis not present

## 2019-02-06 DIAGNOSIS — M6281 Muscle weakness (generalized): Secondary | ICD-10-CM | POA: Diagnosis not present

## 2019-02-06 DIAGNOSIS — R2681 Unsteadiness on feet: Secondary | ICD-10-CM | POA: Diagnosis not present

## 2019-02-07 DIAGNOSIS — M6281 Muscle weakness (generalized): Secondary | ICD-10-CM | POA: Diagnosis not present

## 2019-02-07 DIAGNOSIS — R1312 Dysphagia, oropharyngeal phase: Secondary | ICD-10-CM | POA: Diagnosis not present

## 2019-02-07 DIAGNOSIS — I110 Hypertensive heart disease with heart failure: Secondary | ICD-10-CM | POA: Diagnosis not present

## 2019-02-07 DIAGNOSIS — R2681 Unsteadiness on feet: Secondary | ICD-10-CM | POA: Diagnosis not present

## 2019-02-08 DIAGNOSIS — R1312 Dysphagia, oropharyngeal phase: Secondary | ICD-10-CM | POA: Diagnosis not present

## 2019-02-08 DIAGNOSIS — I110 Hypertensive heart disease with heart failure: Secondary | ICD-10-CM | POA: Diagnosis not present

## 2019-02-08 DIAGNOSIS — R2681 Unsteadiness on feet: Secondary | ICD-10-CM | POA: Diagnosis not present

## 2019-02-08 DIAGNOSIS — M6281 Muscle weakness (generalized): Secondary | ICD-10-CM | POA: Diagnosis not present

## 2019-02-10 DIAGNOSIS — R1312 Dysphagia, oropharyngeal phase: Secondary | ICD-10-CM | POA: Diagnosis not present

## 2019-02-10 DIAGNOSIS — R2681 Unsteadiness on feet: Secondary | ICD-10-CM | POA: Diagnosis not present

## 2019-02-10 DIAGNOSIS — I110 Hypertensive heart disease with heart failure: Secondary | ICD-10-CM | POA: Diagnosis not present

## 2019-02-10 DIAGNOSIS — M6281 Muscle weakness (generalized): Secondary | ICD-10-CM | POA: Diagnosis not present

## 2019-02-11 DIAGNOSIS — I110 Hypertensive heart disease with heart failure: Secondary | ICD-10-CM | POA: Diagnosis not present

## 2019-02-11 DIAGNOSIS — M6281 Muscle weakness (generalized): Secondary | ICD-10-CM | POA: Diagnosis not present

## 2019-02-11 DIAGNOSIS — R2681 Unsteadiness on feet: Secondary | ICD-10-CM | POA: Diagnosis not present

## 2019-02-11 DIAGNOSIS — R1312 Dysphagia, oropharyngeal phase: Secondary | ICD-10-CM | POA: Diagnosis not present

## 2019-02-13 DIAGNOSIS — R2681 Unsteadiness on feet: Secondary | ICD-10-CM | POA: Diagnosis not present

## 2019-02-13 DIAGNOSIS — M6281 Muscle weakness (generalized): Secondary | ICD-10-CM | POA: Diagnosis not present

## 2019-02-13 DIAGNOSIS — I110 Hypertensive heart disease with heart failure: Secondary | ICD-10-CM | POA: Diagnosis not present

## 2019-02-13 DIAGNOSIS — R1312 Dysphagia, oropharyngeal phase: Secondary | ICD-10-CM | POA: Diagnosis not present

## 2019-02-15 DIAGNOSIS — R2681 Unsteadiness on feet: Secondary | ICD-10-CM | POA: Diagnosis not present

## 2019-02-15 DIAGNOSIS — R1312 Dysphagia, oropharyngeal phase: Secondary | ICD-10-CM | POA: Diagnosis not present

## 2019-02-15 DIAGNOSIS — I110 Hypertensive heart disease with heart failure: Secondary | ICD-10-CM | POA: Diagnosis not present

## 2019-02-15 DIAGNOSIS — M6281 Muscle weakness (generalized): Secondary | ICD-10-CM | POA: Diagnosis not present

## 2019-02-16 DIAGNOSIS — I110 Hypertensive heart disease with heart failure: Secondary | ICD-10-CM | POA: Diagnosis not present

## 2019-02-16 DIAGNOSIS — R2681 Unsteadiness on feet: Secondary | ICD-10-CM | POA: Diagnosis not present

## 2019-02-16 DIAGNOSIS — M6281 Muscle weakness (generalized): Secondary | ICD-10-CM | POA: Diagnosis not present

## 2019-02-16 DIAGNOSIS — R1312 Dysphagia, oropharyngeal phase: Secondary | ICD-10-CM | POA: Diagnosis not present

## 2019-02-18 DIAGNOSIS — I110 Hypertensive heart disease with heart failure: Secondary | ICD-10-CM | POA: Diagnosis not present

## 2019-02-18 DIAGNOSIS — R2681 Unsteadiness on feet: Secondary | ICD-10-CM | POA: Diagnosis not present

## 2019-02-18 DIAGNOSIS — M6281 Muscle weakness (generalized): Secondary | ICD-10-CM | POA: Diagnosis not present

## 2019-02-18 DIAGNOSIS — R1312 Dysphagia, oropharyngeal phase: Secondary | ICD-10-CM | POA: Diagnosis not present

## 2019-02-19 DIAGNOSIS — R1312 Dysphagia, oropharyngeal phase: Secondary | ICD-10-CM | POA: Diagnosis not present

## 2019-02-19 DIAGNOSIS — R2681 Unsteadiness on feet: Secondary | ICD-10-CM | POA: Diagnosis not present

## 2019-02-19 DIAGNOSIS — I110 Hypertensive heart disease with heart failure: Secondary | ICD-10-CM | POA: Diagnosis not present

## 2019-02-19 DIAGNOSIS — M6281 Muscle weakness (generalized): Secondary | ICD-10-CM | POA: Diagnosis not present

## 2019-02-20 DIAGNOSIS — M6281 Muscle weakness (generalized): Secondary | ICD-10-CM | POA: Diagnosis not present

## 2019-02-20 DIAGNOSIS — R1312 Dysphagia, oropharyngeal phase: Secondary | ICD-10-CM | POA: Diagnosis not present

## 2019-02-20 DIAGNOSIS — I110 Hypertensive heart disease with heart failure: Secondary | ICD-10-CM | POA: Diagnosis not present

## 2019-02-20 DIAGNOSIS — R2681 Unsteadiness on feet: Secondary | ICD-10-CM | POA: Diagnosis not present

## 2019-02-21 DIAGNOSIS — R1312 Dysphagia, oropharyngeal phase: Secondary | ICD-10-CM | POA: Diagnosis not present

## 2019-02-21 DIAGNOSIS — R2681 Unsteadiness on feet: Secondary | ICD-10-CM | POA: Diagnosis not present

## 2019-02-21 DIAGNOSIS — M6281 Muscle weakness (generalized): Secondary | ICD-10-CM | POA: Diagnosis not present

## 2019-02-21 DIAGNOSIS — I110 Hypertensive heart disease with heart failure: Secondary | ICD-10-CM | POA: Diagnosis not present

## 2019-02-22 DIAGNOSIS — R2681 Unsteadiness on feet: Secondary | ICD-10-CM | POA: Diagnosis not present

## 2019-02-22 DIAGNOSIS — I110 Hypertensive heart disease with heart failure: Secondary | ICD-10-CM | POA: Diagnosis not present

## 2019-02-22 DIAGNOSIS — R1312 Dysphagia, oropharyngeal phase: Secondary | ICD-10-CM | POA: Diagnosis not present

## 2019-02-22 DIAGNOSIS — M6281 Muscle weakness (generalized): Secondary | ICD-10-CM | POA: Diagnosis not present

## 2019-02-25 DIAGNOSIS — R2681 Unsteadiness on feet: Secondary | ICD-10-CM | POA: Diagnosis not present

## 2019-02-25 DIAGNOSIS — R1312 Dysphagia, oropharyngeal phase: Secondary | ICD-10-CM | POA: Diagnosis not present

## 2019-02-25 DIAGNOSIS — I110 Hypertensive heart disease with heart failure: Secondary | ICD-10-CM | POA: Diagnosis not present

## 2019-02-25 DIAGNOSIS — M6281 Muscle weakness (generalized): Secondary | ICD-10-CM | POA: Diagnosis not present

## 2019-02-26 DIAGNOSIS — R2681 Unsteadiness on feet: Secondary | ICD-10-CM | POA: Diagnosis not present

## 2019-02-26 DIAGNOSIS — I110 Hypertensive heart disease with heart failure: Secondary | ICD-10-CM | POA: Diagnosis not present

## 2019-02-26 DIAGNOSIS — M6281 Muscle weakness (generalized): Secondary | ICD-10-CM | POA: Diagnosis not present

## 2019-02-26 DIAGNOSIS — M810 Age-related osteoporosis without current pathological fracture: Secondary | ICD-10-CM | POA: Diagnosis not present

## 2019-02-27 DIAGNOSIS — M810 Age-related osteoporosis without current pathological fracture: Secondary | ICD-10-CM | POA: Diagnosis not present

## 2019-02-27 DIAGNOSIS — M6281 Muscle weakness (generalized): Secondary | ICD-10-CM | POA: Diagnosis not present

## 2019-02-27 DIAGNOSIS — I110 Hypertensive heart disease with heart failure: Secondary | ICD-10-CM | POA: Diagnosis not present

## 2019-02-27 DIAGNOSIS — R2681 Unsteadiness on feet: Secondary | ICD-10-CM | POA: Diagnosis not present

## 2019-02-28 ENCOUNTER — Encounter: Payer: Self-pay | Admitting: Internal Medicine

## 2019-02-28 ENCOUNTER — Non-Acute Institutional Stay (SKILLED_NURSING_FACILITY): Payer: Medicare Other | Admitting: Internal Medicine

## 2019-02-28 DIAGNOSIS — R2681 Unsteadiness on feet: Secondary | ICD-10-CM | POA: Diagnosis not present

## 2019-02-28 DIAGNOSIS — E1140 Type 2 diabetes mellitus with diabetic neuropathy, unspecified: Secondary | ICD-10-CM | POA: Diagnosis not present

## 2019-02-28 DIAGNOSIS — M6281 Muscle weakness (generalized): Secondary | ICD-10-CM | POA: Diagnosis not present

## 2019-02-28 DIAGNOSIS — I5032 Chronic diastolic (congestive) heart failure: Secondary | ICD-10-CM

## 2019-02-28 DIAGNOSIS — I110 Hypertensive heart disease with heart failure: Secondary | ICD-10-CM | POA: Diagnosis not present

## 2019-02-28 DIAGNOSIS — E1169 Type 2 diabetes mellitus with other specified complication: Secondary | ICD-10-CM | POA: Diagnosis not present

## 2019-02-28 DIAGNOSIS — U071 COVID-19: Secondary | ICD-10-CM | POA: Diagnosis not present

## 2019-02-28 DIAGNOSIS — E785 Hyperlipidemia, unspecified: Secondary | ICD-10-CM | POA: Diagnosis not present

## 2019-02-28 DIAGNOSIS — I10 Essential (primary) hypertension: Secondary | ICD-10-CM | POA: Diagnosis not present

## 2019-02-28 DIAGNOSIS — M810 Age-related osteoporosis without current pathological fracture: Secondary | ICD-10-CM | POA: Diagnosis not present

## 2019-02-28 NOTE — Progress Notes (Signed)
Location:  [facility] Room Number: 201-D Place of Service:  SNF [id number]) Provider:  Granville Lewis, PA-C  Hennie [name], MD  Patient Care Team: Hennie [name], MD as PCP - General (Internal Medicine)  Extended Emergency Contact Information Primary Emergency Contact: Hosick,[name] Address: [address] [place], [place] [address] [place] of [place] Phone: [phone] Mobile Phone: [phone] Relation: Sister Secondary Emergency Contact: [name],[name] Address: [address]          [place], [place] [address] Johnnette Litter of [place] Phone: [phone] Relation: Granddaughter  Code Status:  Full Code Goals of care: Advanced Directive information Advanced Directives 12/03/2018  Does Patient Have a Medical Advance Directive? No  Type of Advance Directive -  Does patient want to make changes to medical advance directive? -  Would patient like information on creating a medical advance directive? -  Pre-existing out of facility DNR order (yellow form or pink MOST form) -     Chief Complaint  Patient presents with  . Medical Management of Chronic Issues    Routine [facility] visit  Medical management of chronic medical conditions including hypertension-type 2 diabetes-iron deficiency anemia-vitamin D deficiency-CHF- hyperlipidemia-chronic kidney disease-neuropathy- dementia  HPI:  Pt is an 85 y.o. female seen today for medical management of chronic diseases.  As noted above.  Nursing does not report any recent acute issues- she appears to Kristl doing fairly well with supportive care her weight appears to Lakeesha relatively stable in the high 80s.  She is on supplements including boost and Magic cup.  She was recently in the isolation unit with a positive diagnosis of COVID-19 but remained asymptomatic she did receive the COVID cocktail of vitamin C vitamin D zinc and low-dose aspirin.  She is sitting in her wheelchair  comfortably today she is somewhat of a poor historian this is complicated by language barrier as she speaks largely [place]  In regards to type 2 diabetes she is no longer on Glucophage hemoglobin A1c has been satisfactory it was 5.5 on lab done in early July.  She does have a history of CHF she is on Lasix 40 mg a day with potassium supplementation her weight appears to Babara stable she does not really show evidence of any concerning increased edema today.  I do note on a recent metabolic panel her potassium was slightly low at 3.3 we will update this she is on 30 mEq of potassium a day.  Regards to hypertension she is only on Lasix this appears to Deatrice stable recent blood pressures 113/77-124/75- the highest when I see is 140/60.  She also has a history of iron deficiency anemia she is on iron hemoglobin has shown stability it was 11.8 on the lab done in early June.  She also has a history of vitamin D deficiency level was 16.38 back in June this is supplemented 50,000 units weekly and update vitamin D level was due in November.  In regards to hyperlipidemia she is not on any medications LDL was 87 on the lab done in July which is satisfactory considering her age and comorbidities.  Regards to chronic kidney disease this is been relatively stable creatinine was 0.98 BUN 17.3 on lab done in June and will have this updated as well especially since she is on Lasix.  In regards to neuropathy she is on Neurontin 300 mg a day.     Past  Medical History:  Diagnosis Date  . Breast mass, right 12/13/2013  . Chronic diastolic CHF (congestive heart failure) (HCC) 07/19/2013  . Chronic diastolic heart failure (HCC)   . Chronic kidney disease, stage II (mild)   . Chronic renal disease, stage 2, mildly decreased glomerular filtration rate (GFR) between 60-89 mL/min/1.73 square meter 09/03/2014  . Closed T12 fracture (HCC) 07/16/2013  . Depression 07/18/2013  . Diabetes mellitus   . Diabetes mellitus  due to underlying condition (HCC) 06/21/2013  . Diabetes mellitus without complication (HCC)   . Dysphagia, oropharyngeal phase   . GERD (gastroesophageal reflux disease) 12/17/2014  . Gout   . Hypertension   . Hypertensive heart disease with congestive heart failure (HCC) 06/21/2013  . Iron deficiency anemia 09/03/2014  . Iron deficiency anemia, unspecified   . Leukocytosis 07/18/2013  . Lumbago 10/23/2013  . Lung nodule < 6cm on CT 11/13/2016  . Osteopenia 07/16/2013  . Syncope 06/21/2013  . Type 2 diabetes, controlled, with neuropathy (HCC) 10/18/2013  . Unspecified constipation 10/18/2013  . Vitamin D deficiency 04/11/2016   Past Surgical History:  Procedure Laterality Date  . NO PAST SURGERIES      Allergies  Allergen Reactions  . Tequin [Gatifloxacin] Other (See Comments)    [facility]    Outpatient Encounter Medications as of 02/28/2019  Medication Sig  . aspirin 81 MG chewable tablet Chew 1 tablet (81 mg total) by mouth daily.  . Cholecalciferol (VITAMIN D3) 1.25 MG (50000 UT) CAPS Take 1 capsule by mouth once a week.   . ferrous sulfate 325 (65 FE) MG tablet Take 325 mg by mouth daily with breakfast.   . furosemide (LASIX) 40 MG tablet Take 40 mg by mouth every morning.   . gabapentin (NEURONTIN) 300 MG capsule Take 1 capsule (300 mg total) by mouth at bedtime.  Marland Kitchen. NUTRITIONAL SUPPLEMENT LIQD Take 1 each by mouth every evening. "Magic Cup" - Take one cup with dinner as supplement  . Nutritional Supplements (FEEDING SUPPLEMENT, BOOST BREEZE,) LIQD Take 237 mLs by mouth daily.  . potassium chloride (K-DUR) 10 MEQ tablet Take 30 mEq by mouth daily. 3 tabs to = 30 mEq  . traMADol (ULTRAM) 50 MG tablet Take 1 tablet (50 mg total) by mouth 2 (two) times daily.  . [DISCONTINUED] Ascorbic Acid (VITAMIN C) 1000 MG tablet Take 1,000 mg by mouth daily.  . [DISCONTINUED] Zinc Sulfate 220 (50 Zn) MG TABS Take 1 tablet by mouth daily.   No facility-administered encounter  medications on file as of 02/28/2019.     Review of Systems   This is quite limited secondary to patient's dementia as well as language difficulties since she does speak largely [place]- however nursing does not report any issues and she appears to Kaili comfortable today does not appear to have any really acute concerns from what I been able to ascertain  Immunization History  Administered Date(s) Administered  . Influenza-Unspecified 04/06/2016, 04/06/2017, 03/13/2018  . PPD Test 08/12/2013  . Pneumococcal Conjugate-13 12/12/2017  . Pneumococcal Polysaccharide-23 06/23/2013  . Tdap 12/11/2017   Pertinent  Health Maintenance Due  Topic Date Due  . INFLUENZA VACCINE  01/26/2019  . DEXA SCAN  06/28/2023 (Originally 06/27/1999)  . OPHTHALMOLOGY EXAM  04/03/2019  . HEMOGLOBIN A1C  05/31/2019  . FOOT EXAM  06/26/2019  . URINE MICROALBUMIN  12/01/2019  . PNA vac Low Risk Adult  Completed   Fall Risk  12/11/2017 12/12/2016 12/09/2016  Falls in the past year? No  Yes Yes  Comment - 10/09/16 -  Number falls in past yr: - - 1  Injury with Fall? - - Yes   Functional Status Survey:    Vitals:   02/28/19 1019  BP: 113/77  Pulse: 81  Resp: 19  Temp: (!) 97.5 F (36.4 C)  TempSrc: Oral  Weight: 87 lb 12.8 oz (39.8 kg)  Height: 4\' 9"  (1.448 m)   Body mass index is 19 kg/m. Physical Exam General this is a somewhat frail elderly female in no distress sitting comfortably in her wheelchair.  Her skin is warm and dry-I do note on the lateral left leg just below the knee there is a small brawny bruise appears to Denika old it does not appear to Kaleia tender or inflamed or infected.  Eyes visual acuity appears to Marian intact sclera are clear.  Oropharynx is clear mucous membranes moist.  Chest is clear to auscultation there is no labored breathing effort is somewhat poor.  Heart is regular rate and rhythm without murmur gallop or rub he does not appear to have significant lower extremity edema.   Abdomen is soft nontender with positive bowel sounds.   Musculoskeletal Limited exam since he is sitting in wheelchair but appears able to move her extremities x4 at baseline I do not see any changes other than chronic arthritic she is able to flex and extend at the knee without discomfort.  Neurologic appears grossly intact she does not speak much today cranial nerves appear to Nikol intact I do not see any lateralizing findings.  Psych complete assessment was somewhat difficult because of language issues- she did attempt to follow simple verbal commands.  .     Labs reviewed: Recent Labs    05/14/18 07/09/18 1949 07/21/18 1105  NA 141 142 143  K 4.6 3.6 3.5  CL  --  102 106  CO2  --  29 28  GLUCOSE  --  120* 109*  BUN 22* 30* 17  CREATININE 1.1 1.47* 0.95  CALCIUM  --  8.8* 9.3   Recent Labs    05/01/18 07/09/18 1949 07/21/18 1105  AST 19 18 20   ALT 16 11 13   ALKPHOS 81 63 67  BILITOT  --  0.5 0.7  PROT  --  6.9 7.6  ALBUMIN  --  3.5 4.0   Recent Labs    07/09/18 1949 07/21/18 1105 11/29/18  WBC 4.4 4.2 5.5  NEUTROABS  --   --  4  HGB 11.7* 12.0 11.8*  HCT 36.7 38.2 34*  MCV 93.1 94.6  --   PLT 213 192 202   Lab Results  Component Value Date   TSH 0.24 (A) 11/29/2018   Lab Results  Component Value Date   HGBA1C 5.5 11/29/2018   Lab Results  Component Value Date   CHOL 145 05/01/2018   HDL 48 05/01/2018   LDLCALC 78 05/01/2018   TRIG 96 05/01/2018   CHOLHDL 3.2 06/22/2013    Significant Diagnostic Results in last 30 days:  No results found.  Assessment/Plan  #1- history of CHF at this point appears to Reyah compensated her weight is stable she does not really show evidence of any fluid overload she is on Lasix 40 mg a day with potassium supplementation 30 mEq.  At this point will monitor.  2.  Mild hypokalemia appears her potassium was 3.3 on lab most recently she is on 30 mEq of potassium will have this updated to see where we stand.  3.  History of type 2 diabetes she is no longer on Glucophage hemoglobin A1c was 5.5 in July this appears to Amalea stable and largely diet controlled.  4.-  History of iron deficiency anemia hemoglobin has shown stability hovering around 12 most recently 11.8 in June and will have this updated.  5.  History of vitamin D deficiency she is on supplementation 50,000 units q. weekly vitamin D level of 16.28 June update level is pending for November.  6.  History of hyperlipidemia she is not on any medications her LDL was 87 HDL 51 on lab done this past June this is satisfactory.  Considering her relatively advanced age and comorbidities.   7 History of neuropathy continues on Neurontin 300 mg daily.  8.  History of hypertension this appears stable she is only on Lasix  #9 history of COVID positive test-she was moved to the isolation unit she has completed her quarantine and is back in a regular room and remains asymptomatic  Again will update a CBC with her history of iron deficiency anemia as well as a metabolic pane  To  ensure stability of renal function and potassium  [id number]

## 2019-03-01 DIAGNOSIS — E119 Type 2 diabetes mellitus without complications: Secondary | ICD-10-CM | POA: Diagnosis not present

## 2019-03-01 DIAGNOSIS — I10 Essential (primary) hypertension: Secondary | ICD-10-CM | POA: Diagnosis not present

## 2019-03-01 DIAGNOSIS — M6281 Muscle weakness (generalized): Secondary | ICD-10-CM | POA: Diagnosis not present

## 2019-03-01 DIAGNOSIS — I110 Hypertensive heart disease with heart failure: Secondary | ICD-10-CM | POA: Diagnosis not present

## 2019-03-01 DIAGNOSIS — M810 Age-related osteoporosis without current pathological fracture: Secondary | ICD-10-CM | POA: Diagnosis not present

## 2019-03-01 DIAGNOSIS — R2681 Unsteadiness on feet: Secondary | ICD-10-CM | POA: Diagnosis not present

## 2019-03-01 LAB — COMPREHENSIVE METABOLIC PANEL
Albumin: 3.5 (ref 3.5–5.0)
Calcium: 9.3 (ref 8.7–10.7)
GFR calc Af Amer: 68.87
GFR calc non Af Amer: 59.42
Globulin: 2.6

## 2019-03-01 LAB — HEPATIC FUNCTION PANEL
ALT: 11 (ref 7–35)
AST: 17 (ref 13–35)
Alkaline Phosphatase: 65 (ref 25–125)
Bilirubin, Total: 0.4

## 2019-03-01 LAB — BASIC METABOLIC PANEL
BUN: 14 (ref 4–21)
CO2: 30 — AB (ref 13–22)
Chloride: 101 (ref 99–108)
Creatinine: 0.9 (ref 0.5–1.1)
Glucose: 81
Potassium: 4.3 (ref 3.4–5.3)
Sodium: 143 (ref 137–147)

## 2019-03-01 LAB — CBC: RBC: 4.05 (ref 3.87–5.11)

## 2019-03-01 LAB — CBC AND DIFFERENTIAL
HCT: 37 (ref 36–46)
Hemoglobin: 12.1 (ref 12.0–16.0)
Platelets: 194 (ref 150–399)
WBC: 4.4

## 2019-03-03 DIAGNOSIS — I110 Hypertensive heart disease with heart failure: Secondary | ICD-10-CM | POA: Diagnosis not present

## 2019-03-03 DIAGNOSIS — R2681 Unsteadiness on feet: Secondary | ICD-10-CM | POA: Diagnosis not present

## 2019-03-03 DIAGNOSIS — M810 Age-related osteoporosis without current pathological fracture: Secondary | ICD-10-CM | POA: Diagnosis not present

## 2019-03-03 DIAGNOSIS — M6281 Muscle weakness (generalized): Secondary | ICD-10-CM | POA: Diagnosis not present

## 2019-03-04 DIAGNOSIS — I110 Hypertensive heart disease with heart failure: Secondary | ICD-10-CM | POA: Diagnosis not present

## 2019-03-04 DIAGNOSIS — M6281 Muscle weakness (generalized): Secondary | ICD-10-CM | POA: Diagnosis not present

## 2019-03-04 DIAGNOSIS — R2681 Unsteadiness on feet: Secondary | ICD-10-CM | POA: Diagnosis not present

## 2019-03-04 DIAGNOSIS — M810 Age-related osteoporosis without current pathological fracture: Secondary | ICD-10-CM | POA: Diagnosis not present

## 2019-03-06 DIAGNOSIS — M810 Age-related osteoporosis without current pathological fracture: Secondary | ICD-10-CM | POA: Diagnosis not present

## 2019-03-06 DIAGNOSIS — I110 Hypertensive heart disease with heart failure: Secondary | ICD-10-CM | POA: Diagnosis not present

## 2019-03-06 DIAGNOSIS — M6281 Muscle weakness (generalized): Secondary | ICD-10-CM | POA: Diagnosis not present

## 2019-03-06 DIAGNOSIS — R2681 Unsteadiness on feet: Secondary | ICD-10-CM | POA: Diagnosis not present

## 2019-03-07 ENCOUNTER — Encounter: Payer: Self-pay | Admitting: Internal Medicine

## 2019-03-07 ENCOUNTER — Non-Acute Institutional Stay (SKILLED_NURSING_FACILITY): Payer: Medicare Other | Admitting: Internal Medicine

## 2019-03-07 DIAGNOSIS — R2681 Unsteadiness on feet: Secondary | ICD-10-CM | POA: Diagnosis not present

## 2019-03-07 DIAGNOSIS — M6281 Muscle weakness (generalized): Secondary | ICD-10-CM | POA: Diagnosis not present

## 2019-03-07 DIAGNOSIS — I110 Hypertensive heart disease with heart failure: Secondary | ICD-10-CM | POA: Diagnosis not present

## 2019-03-07 DIAGNOSIS — Z0000 Encounter for general adult medical examination without abnormal findings: Secondary | ICD-10-CM | POA: Diagnosis not present

## 2019-03-07 DIAGNOSIS — M810 Age-related osteoporosis without current pathological fracture: Secondary | ICD-10-CM | POA: Diagnosis not present

## 2019-03-07 NOTE — Progress Notes (Signed)
Subjective:   Margaret [name] is a 85 y.o. female who presents for Medicare Annual (Subsequent) preventive examination.  Review of Systems:  This is limited because of language barrier is provided by nursing as well.  General no complaints of fever chills her weight appears to Margaret [name] relatively stable recently.  Head ears eyes nose mouth and throat is not complain of any visual changes or sore throat.  Respiratory does not complain of shortness of breath or cough.  Cardiac does not complain of chest pain it appears her edema.  GI is not complaining of abdominal discomfort no nausea or vomiting has been noted.  Musculoskeletal some weakness and arthritic changes but does not appear to have pain.  Neurologic is not complaining of dizziness or headaches no reports of syncope.  Psych again there is a possibility of depression but family prefers patient not Margaret [name] on medications-at times apparently will have some crying episodes       Objective:     Vitals: BP 137/67   Pulse 66   Temp (!) 97.2 F (36.2 C) (Oral)   Resp 18   Ht 4\' 9"  (1.448 m)   Wt 87 lb (39.5 kg)   LMP  (LMP Unknown)   BMI 18.83 kg/m   Body mass index is 18.83 kg/m.   In general this is a pleasant cooperative elderly female in no distress.  Her skin is warm and dry.  Eyes visual acuity appears to Margaret [name] intact.  Oropharynx is clear mucous membranes moist.  Chest is clear to auscultation with somewhat shallow air entry no labored breathing.  Heart is regular rate and rhythm without murmur gallop or rub she does not have significant edema.  Abdomen is soft nontender with active bowel sounds.  Musculoskeletal does move all extremities x4 it appears at relative baseline she is able to ambulate in a wheelchair and apparently stand with assistance.  Neurologic is grossly intact cannot really appreciate lateralizing findings.  Psych she is pleasant appropriate again communication somewhat tender by language barriers  but she seems to understand instructions well and did not really indicate any discomfort or displeasure today  Advanced Directives 12/03/2018 11/26/2018 07/21/2018 07/09/2018 12/11/2017 11/30/2017 11/08/2017  Does Patient Have a Medical Advance Directive? No Yes No No No No No  Type of Advance Directive - (No Data) - - - - -  Does patient want to make changes to medical advance directive? - No - Patient declined - - - - -  Would patient like information on creating a medical advance directive? - - No - Patient declined No - Patient declined No - Patient declined - -  Pre-existing out of facility DNR order (yellow form or pink MOST form) - - - - - - -    Tobacco Social History   Tobacco Use  Smoking Status Former Smoker  Smokeless Tobacco Never Used     Counseling given: Not Answered   Clinical Intake:  Pre-visit preparation completed: Yes  Pain : No/denies pain     BMI - recorded: 18.82 Nutritional Risks: Unintentional weight loss Diabetes: Yes CBG done?: No     Interpreter Needed?: Yes Patient Declined Interpreter : Yes  Comments: Patient is Margaret [name] does have a supportive family there is somewhat of a language barrier but she can understand English to some extent and communicate.  Complete assessment is somewhat difficult because of the-but she does assist at times although does require significant assistance at other times.  She is somewhat of a  fall risk that she will take herself to the bathroom at times.  Nursing does not report any acute concerns.  She does apparently have some cognitive decline but this is difficult to fully assess.  There have been attempts to have a tele-visit interpreter but patient apparently does not really respond to.  Past Medical History:  Diagnosis Date  . Breast mass, right 12/13/2013  . Chronic diastolic CHF (congestive heart failure) (HCC) 07/19/2013  . Chronic diastolic heart failure (HCC)   . Chronic kidney disease, stage II (mild)   .  Chronic renal disease, stage 2, mildly decreased glomerular filtration rate (GFR) between 60-89 mL/min/1.73 square meter 09/03/2014  . Closed T12 fracture (HCC) 07/16/2013  . Depression 07/18/2013  . Diabetes mellitus   . Diabetes mellitus due to underlying condition (HCC) 06/21/2013  . Diabetes mellitus without complication (HCC)   . Dysphagia, oropharyngeal phase   . GERD (gastroesophageal reflux disease) 12/17/2014  . Gout   . Hypertension   . Hypertensive heart disease with congestive heart failure (HCC) 06/21/2013  . Iron deficiency anemia 09/03/2014  . Iron deficiency anemia, unspecified   . Leukocytosis 07/18/2013  . Lumbago 10/23/2013  . Lung nodule < 6cm on CT 11/13/2016  . Osteopenia 07/16/2013  . Syncope 06/21/2013  . Type 2 diabetes, controlled, with neuropathy (HCC) 10/18/2013  . Unspecified constipation 10/18/2013  . Vitamin D deficiency 04/11/2016   Past Surgical History:  Procedure Laterality Date  . NO PAST SURGERIES     History reviewed. No pertinent family history. Social History   Socioeconomic History  . Marital status: Single    Spouse name: Not on file  . Number of children: Not on file  . Years of education: Not on file  . Highest education level: Not on file  Occupational History  . Occupation: Domestic  Engineer, productionocial Needs  . Financial resource strain: Not on file  . Food insecurity    Worry: Not on file    Inability: Not on file  . Transportation needs    Medical: Not on file    Non-medical: Not on file  Tobacco Use  . Smoking status: Former Games developermoker  . Smokeless tobacco: Never Used  Substance and Sexual Activity  . Alcohol use: No  . Drug use: No  . Sexual activity: Not Currently  Lifestyle  . Physical activity    Days per week: Not on file    Minutes per session: Not on file  . Stress: Not on file  Relationships  . Social Musicianconnections    Talks on phone: Not on file    Gets together: Not on file    Attends religious service: Not on file    Active  member of club or organization: Not on file    Attends meetings of clubs or organizations: Not on file    Relationship status: Not on file  Other Topics Concern  . Not on file  Social History Narrative   ** Merged History Encounter **       ** Merged History Encounter **      Admitted to [facility] 07/22/13   Never married   Former smoker   Alcohol none   Full Code           Outpatient Encounter Medications as of 03/07/2019  Medication Sig  . aspirin 81 MG chewable tablet Chew 1 tablet (81 mg total) by mouth daily.  . Cholecalciferol (VITAMIN D3) 1.25 MG (50000 UT) CAPS Take 1 capsule by mouth once a week.   .Marland Kitchen  ferrous sulfate 325 (65 FE) MG tablet Take 325 mg by mouth daily with breakfast.   . furosemide (LASIX) 40 MG tablet Take 40 mg by mouth every morning.   . gabapentin (NEURONTIN) 300 MG capsule Take 1 capsule (300 mg total) by mouth at bedtime.  Marland Kitchen NUTRITIONAL SUPPLEMENT LIQD Take 1 each by mouth every evening. "Magic Cup" - Take one cup with dinner as supplement  . Nutritional Supplements (FEEDING SUPPLEMENT, BOOST BREEZE,) LIQD Take 237 mLs by mouth daily.  . potassium chloride (K-DUR) 10 MEQ tablet Take 30 mEq by mouth daily. 3 tabs to = 30 mEq  . traMADol (ULTRAM) 50 MG tablet Take 1 tablet (50 mg total) by mouth 2 (two) times daily.   No facility-administered encounter medications on file as of 03/07/2019.     Activities of Daily Living In your present state of health, do you have any difficulty performing the following activities: 07/09/2018  Hearing? Y  Vision? N  Difficulty concentrating or making decisions? Y  Walking or climbing stairs? Y  Dressing or bathing? Y  Some recent data might Margaret [name] hidden    [name], MD as PCP - General (Internal Medicine)    Assessment:   This is a routine wellness examination for Margaret [name]--an 85 year old female who is a long-term resident of [facility]--this is a annual Medicare wellness  visit  Exercise Activities and Dietary recommendations  Continue supplements to trymaintainweight as much as possible  Goals    . Maintain Lifestyle     Pt will maintain lifestyle.      Emphasis will Margaret [name] to improve her quality of life as much as possible- she is a type II diabetic but this is well controlled with a hemoglobin A1c 5.5 on recent lab she is no longer on any medication.  Somewhat of a language barrier but she is able to communicate and appears to do well with supportive care.  She also has apparently some cognitive decline but is able to function in the setting and per staff at times actually will borrow money from a staff member and remember that several days later and pay it back Fall Risk Fall Risk  12/11/2017 12/12/2016 12/09/2016  Falls in the past year? No Yes Yes  Comment - 10/09/16 -  Number falls in past yr: - - 1  Injury with Fall? - - Yes   Is the patient's home free of loose throw rugs in walkways, pet beds, electrical cords, etc?   yes      Grab bars in the bathroom? yes      Handrails on the stairs?   doesnot apply      Adequate lighting?   yes  Timed Get Up and Go performed: no  Depression Screen PHQ 2/9 Scores 12/11/2017 12/09/2016  PHQ - 2 Score - 0  Exception Documentation Medical reason -  Secondary to language barriers and patient not really understanding interpreter tele-visit-this is been difficult to fully assess apparently she had been on depression medications previously but family did not really desire to Margaret [name] on this and this has been discontinued.  Apparently at times will have some crying episodes but this apparently is not persistent.  Apparently at times since her son cannot visit because of the coronavirus quarantine she will think that her son has passed away and has to Margaret [name] reassured her son is all right  Cognitive Function MMSE - Mini Mental State Exam 12/11/2017  Not completed: Unable to  complete  --As noted above there appears to Margaret [name]  possibly some mild cognitive decline but this is difficult to fully assess because of language barrier she is able to function well and as noted above has quite a good memory at times      Immunization History  Administered Date(s) Administered  . Influenza-Unspecified 04/06/2016, 04/06/2017, 03/13/2018  . PPD Test 08/12/2013  . Pneumococcal Conjugate-13 12/12/2017  . Pneumococcal Polysaccharide-23 06/23/2013  . Tdap 12/11/2017    Qualifies for Shingles Vaccine?  At this point have deferred because of coronavirus quarantine  Screening Tests Health Maintenance  Topic Date Due  . INFLUENZA VACCINE  01/26/2019  . DEXA SCAN  06/28/2023 (Originally 06/27/1999)  . OPHTHALMOLOGY EXAM  04/03/2019  . HEMOGLOBIN A1C  05/31/2019  . FOOT EXAM  06/26/2019  . URINE MICROALBUMIN  12/01/2019  . TETANUS/TDAP  12/12/2027  . PNA vac Low Risk Adult  Completed    Cancer Screenings: Lung: Low Dose CT Chest recommended if Age 76-80 years, 30 pack-year currently smoking OR have quit w/in 15years. Patient would Margaret [name] a poor candidate secondary to advanced age also because of coronavirus quarantine will Margaret [name] difficult to pursue currently Breast:  Up to date on Mammogram?    Up to date of Bone Density/Dexa?  Colorectal: ------ mammogram bone density and colorectal have been difficult to perform because of her advanced age comorbidities and complicated with coronavirus quarantine presently  which does not really allow any nonessential outside visits        Plan:   At this point plans will Margaret [name] to continue quality of life measures as much as possible try to promote safety measures to prevent falls.  Patient will need Margaret [name] encouraged to ask for help when she goes to the bathroom and tries activities on her own.  Also will try to maintain nutritional status as much as possible her weight appears to have stabilized in the higher 80s-she is on supplements but p.o. intake will need to Margaret [name] encouraged  I have  personally reviewed and noted the following in the patient's chart:   . Medical and social history . Use of alcohol, tobacco or illicit drugs  . Current medications and supplements . Functional ability and status . Nutritional status . Physical activity . Advanced directives . List of other physicians . Hospitalizations, surgeries, and ER visits in previous 12 months . Vitals . Screenings to include cognitive, depression, and falls . Referrals and appointments  In addition, I have reviewed and discussed with patient certain preventive protocols, quality metrics, and best practice recommendations. A written personalized care plan for preventive services as well as general preventive health recommendations were provided to patient.  Again reviewing protocols and metrics and recommendations with patient was difficult because of language barrier-she certainly does have some understanding of English and is able to function fairly effectively-.  She has not really understood the purpose of a tele-interpreter however which has hindered  communication at times     Edmon CrapeArlo , PA-C  03/07/2019

## 2019-03-08 DIAGNOSIS — M810 Age-related osteoporosis without current pathological fracture: Secondary | ICD-10-CM | POA: Diagnosis not present

## 2019-03-08 DIAGNOSIS — R2681 Unsteadiness on feet: Secondary | ICD-10-CM | POA: Diagnosis not present

## 2019-03-08 DIAGNOSIS — I110 Hypertensive heart disease with heart failure: Secondary | ICD-10-CM | POA: Diagnosis not present

## 2019-03-08 DIAGNOSIS — M6281 Muscle weakness (generalized): Secondary | ICD-10-CM | POA: Diagnosis not present

## 2019-03-10 DIAGNOSIS — I110 Hypertensive heart disease with heart failure: Secondary | ICD-10-CM | POA: Diagnosis not present

## 2019-03-10 DIAGNOSIS — M6281 Muscle weakness (generalized): Secondary | ICD-10-CM | POA: Diagnosis not present

## 2019-03-10 DIAGNOSIS — R2681 Unsteadiness on feet: Secondary | ICD-10-CM | POA: Diagnosis not present

## 2019-03-10 DIAGNOSIS — M810 Age-related osteoporosis without current pathological fracture: Secondary | ICD-10-CM | POA: Diagnosis not present

## 2019-03-11 ENCOUNTER — Non-Acute Institutional Stay (SKILLED_NURSING_FACILITY): Payer: Medicare Other | Admitting: Internal Medicine

## 2019-03-11 ENCOUNTER — Encounter: Payer: Self-pay | Admitting: Internal Medicine

## 2019-03-11 ENCOUNTER — Other Ambulatory Visit: Payer: Self-pay | Admitting: Internal Medicine

## 2019-03-11 DIAGNOSIS — R2681 Unsteadiness on feet: Secondary | ICD-10-CM | POA: Diagnosis not present

## 2019-03-11 DIAGNOSIS — F339 Major depressive disorder, recurrent, unspecified: Secondary | ICD-10-CM | POA: Diagnosis not present

## 2019-03-11 DIAGNOSIS — M6281 Muscle weakness (generalized): Secondary | ICD-10-CM | POA: Diagnosis not present

## 2019-03-11 DIAGNOSIS — I110 Hypertensive heart disease with heart failure: Secondary | ICD-10-CM | POA: Diagnosis not present

## 2019-03-11 DIAGNOSIS — M810 Age-related osteoporosis without current pathological fracture: Secondary | ICD-10-CM | POA: Diagnosis not present

## 2019-03-11 MED ORDER — TRAMADOL HCL 50 MG PO TABS: 50.0000 mg | ORAL_TABLET | Freq: Two times a day (BID) | ORAL | 0 refills | Status: DC

## 2019-03-11 NOTE — Progress Notes (Signed)
Location:  [facility] Room Number: 201-D Place of Service:  SNF (31)  Hennie [name], MD  Patient Care Team: Hennie [name], MD as PCP - General (Internal Medicine)  Extended Emergency Contact Information Primary Emergency Contact: Farrior,[name] Address: [address] [place], [place] [address] Johnnette Litter of [place] Phone: [phone] Mobile Phone: [phone] Relation: Sister Secondary Emergency Contact: [name],[name] Address: [address]          [place], [place] [address] Johnnette Litter of [place] Phone: [phone] Relation: Granddaughter    Allergies: Tequin [gatifloxacin]  Chief Complaint  Patient presents with  . Acute Visit    Patient is seen for depression.    HPI: Patient is an 84 y.o. female who is being seen because nursing thinks that she is depressed.  She is actively crying right now, which is a big clue, but the nurses also note that she is seen more down in the last few days to weeks.  This is not surprising with COVID-19 restrictions on the SNF.  Patient is actively crying at this moment.  Past Medical History:  Diagnosis Date  . Breast mass, right 12/13/2013  . Chronic diastolic CHF (congestive heart failure) ([place]) 07/19/2013  . Chronic diastolic heart failure ([place])   . Chronic kidney disease, stage II (mild)   . Chronic renal disease, stage 2, mildly decreased glomerular filtration rate (GFR) between 60-89 mL/min/1.73 square meter 09/03/2014  . Closed T12 fracture ([place]) 07/16/2013  . Depression 07/18/2013  . Diabetes mellitus   . Diabetes mellitus due to underlying condition ([place]) 06/21/2013  . Diabetes mellitus without complication ([place])   . Dysphagia, oropharyngeal phase   . GERD (gastroesophageal reflux disease) 12/17/2014  . Gout   . Hypertension   . Hypertensive heart disease with congestive heart failure ([place]) 06/21/2013  . Iron deficiency anemia 09/03/2014  . Iron deficiency anemia,  unspecified   . Leukocytosis 07/18/2013  . Lumbago 10/23/2013  . Lung nodule < 6cm on CT 11/13/2016  . Osteopenia 07/16/2013  . Syncope 06/21/2013  . Type 2 diabetes, controlled, with neuropathy ([place]) 10/18/2013  . Unspecified constipation 10/18/2013  . Vitamin D deficiency 04/11/2016    Past Surgical History:  Procedure Laterality Date  . NO PAST SURGERIES      Allergies as of 03/11/2019      Reactions   Tequin [gatifloxacin] Other (See Comments)   Unknown rxn per [facility]      Medication List       Accurate as of March 11, 2019  2:33 PM. If you have any questions, ask your nurse or doctor.        aspirin 81 MG chewable tablet Chew 1 tablet (81 mg total) by mouth daily.   ferrous sulfate 325 (65 FE) MG tablet Take 325 mg by mouth daily with breakfast.   furosemide 40 MG tablet Commonly known as: LASIX Take 40 mg by mouth every morning.   gabapentin 300 MG capsule Commonly known as: NEURONTIN Take 1 capsule (300 mg total) by mouth at bedtime.   Nutritional Supplement Liqd Take 1 each by mouth every evening. "Magic Cup" - Take one cup with dinner as supplement   feeding supplement (BOOST BREEZE) Liqd Take 237 mLs by mouth daily.   potassium chloride 10 MEQ tablet Commonly known as: K-DUR Take 30 mEq by mouth daily. 3 tabs to = 30 mEq   traMADol 50 MG tablet Commonly  known as: ULTRAM Take 1 tablet (50 mg total) by mouth 2 (two) times daily.   Vitamin D3 1.25 MG (50000 UT) Caps Take 1 capsule by mouth once a week.       No orders of the defined types were placed in this encounter.   Immunization History  Administered Date(s) Administered  . Influenza-Unspecified 04/06/2016, 04/06/2017, 03/13/2018  . PPD Test 08/12/2013  . Pneumococcal Conjugate-13 12/12/2017  . Pneumococcal Polysaccharide-23 06/23/2013  . Tdap 12/11/2017    Social History   Tobacco Use  . Smoking status: Former Games developermoker  . Smokeless tobacco: Never Used  Substance Use Topics  .  Alcohol use: No    Review of Systems  DATA OBTAINED: from nursing GENERAL:  no fevers, fatigue, appetite changes SKIN: No itching, rash HEENT: No complaint RESPIRATORY: No cough, wheezing, SOB CARDIAC: No chest pain, palpitations, lower extremity edema  GI: No abdominal pain, No N/V/D or constipation, No heartburn or reflux  GU: No dysuria, frequency or urgency, or incontinence  MUSCULOSKELETAL: No unrelieved bone/joint pain NEUROLOGIC: No headache, dizziness  PSYCHIATRIC: More sadness than usual per nursing staff  Vitals:   03/11/19 1430  BP: 124/72  Pulse: 79  Resp: 18  Temp: 98 F (36.7 C)   Body mass index is 18.83 kg/m. Physical Exam  GENERAL APPEARANCE: Alert, conversant, crying SKIN: No diaphoresis rash HEENT: Unremarkable RESPIRATORY: Breathing is even, unlabored. Lung sounds are clear   CARDIOVASCULAR: Heart RRR no murmurs, rubs or gallops. No peripheral edema  GASTROINTESTINAL: Abdomen is soft, non-tender, not distended w/ normal bowel sounds.  GENITOURINARY: Bladder non tender, not distended  MUSCULOSKELETAL: No abnormal joints or musculature NEUROLOGIC: Cranial nerves 2-12 grossly intact. Moves all extremities PSYCHIATRIC: Mood and affect with dementia, crying but can Isela distracted momentarily  Patient Active Problem List   Diagnosis Date Noted  . Real time reverse transcriptase PCR positive for COVID-19 virus 01/16/2019  . Suicidal ideation 12/08/2018  . Hyperlipidemia associated with type 2 diabetes mellitus (HCC) 06/10/2018  . Lung nodule < 6cm on CT 11/13/2016  . Vitamin D deficiency 04/11/2016  . UTI (urinary tract infection) 03/24/2015  . GERD (gastroesophageal reflux disease) 12/17/2014  . Hypokalemia 12/17/2014  . Depression 11/25/2014  . Iron deficiency anemia 09/03/2014  . Breast mass, right 12/13/2013  . Lumbago 10/23/2013  . Type 2 diabetes, controlled, with neuropathy (HCC) 10/18/2013  . Peripheral sensory neuropathy due to type 2  diabetes mellitus (HCC) 10/18/2013  . Diabetes mellitus, controlled (HCC) 07/19/2013  . Chronic diastolic CHF (congestive heart failure) (HCC) 07/19/2013  . Leukocytosis 06/21/2013  . Hypertensive heart disease with congestive heart failure (HCC) 06/21/2013  . Diabetes mellitus due to underlying condition (HCC) 06/21/2013    CMP     Component Value Date/Time   NA 143 07/21/2018 1105   NA 141 05/14/2018   K 3.5 07/21/2018 1105   CL 106 07/21/2018 1105   CO2 28 07/21/2018 1105   GLUCOSE 109 (H) 07/21/2018 1105   BUN 17 07/21/2018 1105   BUN 22 (A) 05/14/2018   CREATININE 0.95 07/21/2018 1105   CALCIUM 9.3 07/21/2018 1105   PROT 7.6 07/21/2018 1105   ALBUMIN 4.0 07/21/2018 1105   AST 20 07/21/2018 1105   ALT 13 07/21/2018 1105   ALKPHOS 67 07/21/2018 1105   BILITOT 0.7 07/21/2018 1105   GFRNONAA 55 (L) 07/21/2018 1105   GFRAA >60 07/21/2018 1105   Recent Labs    05/14/18 07/09/18 1949 07/21/18 1105  NA 141 142 143  K  4.6 3.6 3.5  CL  --  102 106  CO2  --  29 28  GLUCOSE  --  120* 109*  BUN 22* 30* 17  CREATININE 1.1 1.47* 0.95  CALCIUM  --  8.8* 9.3   Recent Labs    05/01/18 07/09/18 1949 07/21/18 1105  AST 19 18 20   ALT 16 11 13   ALKPHOS 81 63 67  BILITOT  --  0.5 0.7  PROT  --  6.9 7.6  ALBUMIN  --  3.5 4.0   Recent Labs    07/09/18 1949 07/21/18 1105 11/29/18  WBC 4.4 4.2 5.5  NEUTROABS  --   --  4  HGB 11.7* 12.0 11.8*  HCT 36.7 38.2 34*  MCV 93.1 94.6  --   PLT 213 192 202   Recent Labs    05/01/18  CHOL 145  LDLCALC 78  TRIG 96   Lab Results  Component Value Date   MICROALBUR 1.2 12/01/2018   Lab Results  Component Value Date   TSH 0.24 (A) 11/29/2018   Lab Results  Component Value Date   HGBA1C 5.5 11/29/2018   Lab Results  Component Value Date   CHOL 145 05/01/2018   HDL 48 05/01/2018   LDLCALC 78 05/01/2018   TRIG 96 05/01/2018   CHOLHDL 3.2 06/22/2013    Significant Diagnostic Results in last 30 days:  No results  found.  Assessment and Plan  Depression- patient has never really been happy and SNF-she does understand she thinks her family has left her and COVID-19 restrictions have only made that worse.  Have started patient on Lexapro 10 mg daily; will monitor response    [name], MD

## 2019-03-12 ENCOUNTER — Other Ambulatory Visit: Payer: Self-pay | Admitting: Internal Medicine

## 2019-03-12 DIAGNOSIS — R2681 Unsteadiness on feet: Secondary | ICD-10-CM | POA: Diagnosis not present

## 2019-03-12 DIAGNOSIS — M6281 Muscle weakness (generalized): Secondary | ICD-10-CM | POA: Diagnosis not present

## 2019-03-12 DIAGNOSIS — M810 Age-related osteoporosis without current pathological fracture: Secondary | ICD-10-CM | POA: Diagnosis not present

## 2019-03-12 DIAGNOSIS — I110 Hypertensive heart disease with heart failure: Secondary | ICD-10-CM | POA: Diagnosis not present

## 2019-03-12 MED ORDER — TRAMADOL HCL 50 MG PO TABS: 50.0000 mg | ORAL_TABLET | Freq: Two times a day (BID) | ORAL | 0 refills | Status: DC

## 2019-03-13 DIAGNOSIS — R2681 Unsteadiness on feet: Secondary | ICD-10-CM | POA: Diagnosis not present

## 2019-03-13 DIAGNOSIS — M6281 Muscle weakness (generalized): Secondary | ICD-10-CM | POA: Diagnosis not present

## 2019-03-13 DIAGNOSIS — I110 Hypertensive heart disease with heart failure: Secondary | ICD-10-CM | POA: Diagnosis not present

## 2019-03-13 DIAGNOSIS — M810 Age-related osteoporosis without current pathological fracture: Secondary | ICD-10-CM | POA: Diagnosis not present

## 2019-03-14 ENCOUNTER — Other Ambulatory Visit: Payer: Self-pay

## 2019-03-14 DIAGNOSIS — I110 Hypertensive heart disease with heart failure: Secondary | ICD-10-CM | POA: Diagnosis not present

## 2019-03-14 DIAGNOSIS — R2681 Unsteadiness on feet: Secondary | ICD-10-CM | POA: Diagnosis not present

## 2019-03-14 DIAGNOSIS — M6281 Muscle weakness (generalized): Secondary | ICD-10-CM | POA: Diagnosis not present

## 2019-03-14 DIAGNOSIS — M810 Age-related osteoporosis without current pathological fracture: Secondary | ICD-10-CM | POA: Diagnosis not present

## 2019-03-14 NOTE — Telephone Encounter (Signed)
RX was sent by [facility] on 03/12/2019:  An error occurred while processing the e-prescribing message.   The message was not sent electronically to the requested pharmacy. Contact the pharmacy about the new prescription.  RX will need to Margaret [name] resubmitted

## 2019-03-15 DIAGNOSIS — R2681 Unsteadiness on feet: Secondary | ICD-10-CM | POA: Diagnosis not present

## 2019-03-15 DIAGNOSIS — M6281 Muscle weakness (generalized): Secondary | ICD-10-CM | POA: Diagnosis not present

## 2019-03-15 DIAGNOSIS — M810 Age-related osteoporosis without current pathological fracture: Secondary | ICD-10-CM | POA: Diagnosis not present

## 2019-03-15 DIAGNOSIS — I110 Hypertensive heart disease with heart failure: Secondary | ICD-10-CM | POA: Diagnosis not present

## 2019-03-16 DIAGNOSIS — M810 Age-related osteoporosis without current pathological fracture: Secondary | ICD-10-CM | POA: Diagnosis not present

## 2019-03-16 DIAGNOSIS — M6281 Muscle weakness (generalized): Secondary | ICD-10-CM | POA: Diagnosis not present

## 2019-03-16 DIAGNOSIS — R2681 Unsteadiness on feet: Secondary | ICD-10-CM | POA: Diagnosis not present

## 2019-03-16 DIAGNOSIS — I110 Hypertensive heart disease with heart failure: Secondary | ICD-10-CM | POA: Diagnosis not present

## 2019-03-17 ENCOUNTER — Encounter: Payer: Self-pay | Admitting: Internal Medicine

## 2019-03-18 DIAGNOSIS — R2681 Unsteadiness on feet: Secondary | ICD-10-CM | POA: Diagnosis not present

## 2019-03-18 DIAGNOSIS — I110 Hypertensive heart disease with heart failure: Secondary | ICD-10-CM | POA: Diagnosis not present

## 2019-03-18 DIAGNOSIS — M6281 Muscle weakness (generalized): Secondary | ICD-10-CM | POA: Diagnosis not present

## 2019-03-18 DIAGNOSIS — M810 Age-related osteoporosis without current pathological fracture: Secondary | ICD-10-CM | POA: Diagnosis not present

## 2019-03-19 DIAGNOSIS — M810 Age-related osteoporosis without current pathological fracture: Secondary | ICD-10-CM | POA: Diagnosis not present

## 2019-03-19 DIAGNOSIS — R2681 Unsteadiness on feet: Secondary | ICD-10-CM | POA: Diagnosis not present

## 2019-03-19 DIAGNOSIS — I110 Hypertensive heart disease with heart failure: Secondary | ICD-10-CM | POA: Diagnosis not present

## 2019-03-19 DIAGNOSIS — M6281 Muscle weakness (generalized): Secondary | ICD-10-CM | POA: Diagnosis not present

## 2019-03-19 NOTE — Telephone Encounter (Signed)
Encounter still open awaiting attention of [facility] Lewis, [place]

## 2019-03-20 DIAGNOSIS — I110 Hypertensive heart disease with heart failure: Secondary | ICD-10-CM | POA: Diagnosis not present

## 2019-03-20 DIAGNOSIS — M6281 Muscle weakness (generalized): Secondary | ICD-10-CM | POA: Diagnosis not present

## 2019-03-20 DIAGNOSIS — R2681 Unsteadiness on feet: Secondary | ICD-10-CM | POA: Diagnosis not present

## 2019-03-20 DIAGNOSIS — M810 Age-related osteoporosis without current pathological fracture: Secondary | ICD-10-CM | POA: Diagnosis not present

## 2019-03-21 DIAGNOSIS — I110 Hypertensive heart disease with heart failure: Secondary | ICD-10-CM | POA: Diagnosis not present

## 2019-03-21 DIAGNOSIS — M6281 Muscle weakness (generalized): Secondary | ICD-10-CM | POA: Diagnosis not present

## 2019-03-21 DIAGNOSIS — R2681 Unsteadiness on feet: Secondary | ICD-10-CM | POA: Diagnosis not present

## 2019-03-21 DIAGNOSIS — M810 Age-related osteoporosis without current pathological fracture: Secondary | ICD-10-CM | POA: Diagnosis not present

## 2019-03-21 NOTE — Telephone Encounter (Signed)
Encounter remains open awaiting acknowledgement from [facility]ville Lewis, [place]  I reviewed and did not see where rx was resubmitted.

## 2019-03-22 NOTE — Telephone Encounter (Signed)
Encounter remains open awaiting acknowledgement from [facility]ville Lewis, [place]

## 2019-03-25 DIAGNOSIS — M6281 Muscle weakness (generalized): Secondary | ICD-10-CM | POA: Diagnosis not present

## 2019-03-25 DIAGNOSIS — M810 Age-related osteoporosis without current pathological fracture: Secondary | ICD-10-CM | POA: Diagnosis not present

## 2019-03-25 DIAGNOSIS — I110 Hypertensive heart disease with heart failure: Secondary | ICD-10-CM | POA: Diagnosis not present

## 2019-03-25 DIAGNOSIS — R2681 Unsteadiness on feet: Secondary | ICD-10-CM | POA: Diagnosis not present

## 2019-03-26 ENCOUNTER — Other Ambulatory Visit: Payer: Self-pay | Admitting: Internal Medicine

## 2019-03-26 DIAGNOSIS — R2681 Unsteadiness on feet: Secondary | ICD-10-CM | POA: Diagnosis not present

## 2019-03-26 DIAGNOSIS — I110 Hypertensive heart disease with heart failure: Secondary | ICD-10-CM | POA: Diagnosis not present

## 2019-03-26 DIAGNOSIS — M6281 Muscle weakness (generalized): Secondary | ICD-10-CM | POA: Diagnosis not present

## 2019-03-26 DIAGNOSIS — M810 Age-related osteoporosis without current pathological fracture: Secondary | ICD-10-CM | POA: Diagnosis not present

## 2019-03-26 MED ORDER — TRAMADOL HCL 50 MG PO TABS: 50.0000 mg | ORAL_TABLET | Freq: Two times a day (BID) | ORAL | 0 refills | Status: DC

## 2019-03-27 DIAGNOSIS — R2681 Unsteadiness on feet: Secondary | ICD-10-CM | POA: Diagnosis not present

## 2019-03-27 DIAGNOSIS — M6281 Muscle weakness (generalized): Secondary | ICD-10-CM | POA: Diagnosis not present

## 2019-03-27 DIAGNOSIS — I110 Hypertensive heart disease with heart failure: Secondary | ICD-10-CM | POA: Diagnosis not present

## 2019-03-27 DIAGNOSIS — M810 Age-related osteoporosis without current pathological fracture: Secondary | ICD-10-CM | POA: Diagnosis not present

## 2019-03-28 ENCOUNTER — Non-Acute Institutional Stay (SKILLED_NURSING_FACILITY): Payer: Medicare Other | Admitting: Internal Medicine

## 2019-03-28 ENCOUNTER — Encounter: Payer: Self-pay | Admitting: Internal Medicine

## 2019-03-28 DIAGNOSIS — D508 Other iron deficiency anemias: Secondary | ICD-10-CM

## 2019-03-28 DIAGNOSIS — F0390 Unspecified dementia without behavioral disturbance: Secondary | ICD-10-CM | POA: Diagnosis not present

## 2019-03-28 DIAGNOSIS — M6281 Muscle weakness (generalized): Secondary | ICD-10-CM | POA: Diagnosis not present

## 2019-03-28 DIAGNOSIS — F329 Major depressive disorder, single episode, unspecified: Secondary | ICD-10-CM

## 2019-03-28 DIAGNOSIS — I5032 Chronic diastolic (congestive) heart failure: Secondary | ICD-10-CM

## 2019-03-28 DIAGNOSIS — M810 Age-related osteoporosis without current pathological fracture: Secondary | ICD-10-CM | POA: Diagnosis not present

## 2019-03-28 DIAGNOSIS — I110 Hypertensive heart disease with heart failure: Secondary | ICD-10-CM | POA: Diagnosis not present

## 2019-03-28 DIAGNOSIS — E1140 Type 2 diabetes mellitus with diabetic neuropathy, unspecified: Secondary | ICD-10-CM

## 2019-03-28 DIAGNOSIS — F32A Depression, unspecified: Secondary | ICD-10-CM

## 2019-03-28 NOTE — Progress Notes (Signed)
Location:  [facility] Room Number: 201-D Place of Service:  SNF [phone]) Provider:  Edmon Crape, PA-C  [name], MD  [name], MD as PCP - General (Internal Medicine)  Extended Emergency Contact Information Primary Emergency Contact: Runquist,[name] Address: [address]          [place], [place] [address] [name] of [place] Home Phone: [phone] Mobile Phone: [phone] Relation: Sister Secondary Emergency Contact: [name],[name] Address: [address]          [place], [place] [address] [name] of [place] Home Phone: [phone] Relation: Granddaughter  Code Status:  Full Code Goals of care: Advanced Directive information Advanced Directives 12/03/2018  Does Patient Have a Medical Advance Directive? No  Type of Advance Directive -  Does patient want to make changes to medical advance directive? -  Would patient like information on creating a medical advance directive? -  Pre-existing out of facility DNR order (yellow form or pink MOST form) -     Chief Complaint  Patient presents with  . Medical Management of Chronic Issues    Routine [facility] visit  Medical management of chronic medical conditions including CHF-type 2 diabetes-hypertension- iron deficiency anemia-vitamin D deficiency as well as chronic kidney disease neuropathy and dementia as well as depression  HPI:  Pt is an 85 y.o. female seen today for medical management of chronic diseases.  As noted above.  Most recent acute issue was increased crying episodes and sadness-she was seen by Dr. Lyn Hollingshead and started on low-dose Lexapro.  Patient apparently has had sad feelings because of the coronavirus restrictions and not able to see family.  She is not crying today and the Lexapro I suspect is helping somewhat.  Nursing does not report any recent other acute issues.  She does have a history of dementia but appears to do  relatively well with supportive care she does ambulate in the wheelchair and weight appears relatively stable in the high 80s.  She is on supplementation including boost and Magic cup.  She does have a history of COVID-19 positivity and was in the isolation unit but remained essentially asymptomatic and did receive the COVID cocktail.  Complete review of systems is quite difficult because of language difficulties since she speaks 3 of the nares.  In regards to her other issues she does have a history of hypertension she is only on Lasix and this appears to Ey[place] variable but not consistently high or low blood pressure ranges range from 104/61 up to 155/98.  She is a type II diabetic but this is diet-controlled her hemoglobin A1c is 5.5.  She also has a history of hyperlipidemia she is not on any medications for this LDL was 87 in July.  She also has a history of neuropathy she is on Neurontin at night as well as back pain which she continues on tramadol twice daily for.  Currently she is sitting on the side of the bed comfortably she is not crying she is pleasant and cooperative with exam although language difficulties do make a review of systems difficulty.    Past Medical History:  Diagnosis Date  . Breast mass, right 12/13/2013  . Chronic diastolic CHF (congestive heart failure) (HCC) 07/19/2013  . Chronic diastolic heart failure (HCC)   . Chronic kidney disease, stage II (mild)   . Chronic renal disease, stage 2, mildly decreased glomerular filtration rate (GFR) between 60-89 mL/min/1.73 square meter  09/03/2014  . Closed T12 fracture ([place]) 07/16/2013  . Depression 07/18/2013  . Diabetes mellitus   . Diabetes mellitus due to underlying condition ([place]) 06/21/2013  . Diabetes mellitus without complication ([place])   . Dysphagia, oropharyngeal phase   . GERD (gastroesophageal reflux disease) 12/17/2014  . Gout   . Hypertension   . Hypertensive heart disease with congestive heart failure  ([place]) 06/21/2013  . Iron deficiency anemia 09/03/2014  . Iron deficiency anemia, unspecified   . Leukocytosis 07/18/2013  . Lumbago 10/23/2013  . Lung nodule < 6cm on CT 11/13/2016  . Osteopenia 07/16/2013  . Syncope 06/21/2013  . Type 2 diabetes, controlled, with neuropathy ([place]) 10/18/2013  . Unspecified constipation 10/18/2013  . Vitamin D deficiency 04/11/2016   Past Surgical History:  Procedure Laterality Date  . NO PAST SURGERIES      Allergies  Allergen Reactions  . Tequin [Gatifloxacin] Other (See Comments)    Unknown rxn per [facility]    Outpatient Encounter Medications as of 03/28/2019  Medication Sig  . aspirin 81 MG chewable tablet Chew 1 tablet (81 mg total) by mouth daily.  . Cholecalciferol (VITAMIN D3) 1.25 MG (50000 UT) CAPS Take 1 capsule by mouth once a week.   . escitalopram (LEXAPRO) 10 MG tablet Take 10 mg by mouth daily.  . ferrous sulfate 325 (65 FE) MG tablet Take 325 mg by mouth daily with breakfast.   . furosemide (LASIX) 40 MG tablet Take 40 mg by mouth every morning.   . gabapentin (NEURONTIN) 300 MG capsule Take 1 capsule (300 mg total) by mouth at bedtime.  Marland Kitchen NUTRITIONAL SUPPLEMENT LIQD Take 1 each by mouth every evening. "Magic Cup" - Take one cup with dinner as supplement  . potassium chloride (K-DUR) 10 MEQ tablet Take 30 mEq by mouth daily. 3 tabs to = 30 mEq  . traMADol (ULTRAM) 50 MG tablet Take 1 tablet (50 mg total) by mouth 2 (two) times daily.  . [DISCONTINUED] Nutritional Supplements (FEEDING SUPPLEMENT, BOOST BREEZE,) LIQD Take 237 mLs by mouth daily.   No facility-administered encounter medications on file as of 03/28/2019.     Review of Systems  This is somewhat difficult because of language difficulties.  Provided by nursing as well.  In general neuro man no complaints of fever or chills.  Skin no rashes or itching apparently.  Head ears eyes nose mouth and throat no complaints of visual changes or sore throat.  Respiratory no  increased cough or shortness of breath.  Cardiac no complaints of chest pain or increased edema.  GI no reports of nausea vomiting diarrhea or constipation apparently.  Musculoskeletal does have a history of chronic back pain but does not appear to complain of that today at times she will complain of leg discomfort.  Neurologic does not complain of dizziness or headache apparently.  Psych again she has been started Lexapro for concerns of depression  Immunization History  Administered Date(s) Administered  . Influenza-Unspecified 04/06/2016, 04/06/2017, 03/13/2018  . PPD Test 08/12/2013  . Pneumococcal Conjugate-13 12/12/2017  . Pneumococcal Polysaccharide-23 06/23/2013  . Tdap 12/11/2017   Pertinent  Health Maintenance Due  Topic Date Due  . INFLUENZA VACCINE  01/26/2019  . DEXA SCAN  06/28/2023 (Originally 06/27/1999)  . OPHTHALMOLOGY EXAM  04/03/2019  . HEMOGLOBIN A1C  05/31/2019  . FOOT EXAM  06/26/2019  . URINE MICROALBUMIN  12/01/2019  . PNA vac Low Risk Adult  Completed   Fall Risk  12/11/2017 12/12/2016 12/09/2016  Falls in the  past year? No Yes Yes  Comment - 10/09/16 -  Number falls in past yr: - - 1  Injury with Fall? - - Yes   Functional Status Survey:    Vitals:   03/28/19 0952  BP: 109/68  Pulse: 77  Resp: 18  Temp: 98 F (36.7 C)  TempSrc: Oral  Weight: 87 lb (39.5 kg)  Height:  (1.448 m)   Body mass index is 18.83 kg/m. Physical Exam  In general this is a pleasant elderly female no distress sitting comfortably on the side of the bed.  Her skin is warm and dry.  Eyes visual acuity appears grossly intact sclera and conjunctive are clear.  Oropharynx is clear mucous membranes moist.  Chest is clear to auscultation with no labored breathing air entry is somewhat shallow.  Heart is regular rate and rhythm without murmur gallop or rub she does not appear to have significant lower extremity edema.  Her abdomen is soft nontender with positive  bowel sounds.  Musculoskeletal is able to move all extremities x4 she does have arthritic changes noted most prominently of her legs but I do not really note any significant deformities.  Neurologic is grossly intact she does speak but again she speaks [place] but she does follow simple verbal commands well.  Could not appreciate lateralizing findings.  Psych she is pleasant and appropriate complete assessment difficult because of language barriers-  Labs reviewed:  March 01, 2019.  Sodium 143 potassium 4.3 BUN 13.8 creatinine 0.89.  Albumin 3.5 otherwise liver function tests within normal limits.  WBC 4.4 hemoglobin 12.1 platelets 194.  January 02, 2019.  Hemoglobin A1c was 5.5   Recent Labs    05/14/18 07/09/18 1949 07/21/18 1105  NA 141 142 143  K 4.6 3.6 3.5  CL  --  102 106  CO2  --  29 28  GLUCOSE  --  120* 109*  BUN 22* 30* 17  CREATININE 1.1 1.47* 0.95  CALCIUM  --  8.8* 9.3   Recent Labs    05/01/18 07/09/18 1949 07/21/18 1105  AST ALT ALKPHOS 81 63 67  BILITOT  --  0.5 0.7  PROT  --  6.9 7.6  ALBUMIN  --  3.5 4.0   Recent Labs    07/09/18 1949 07/21/18 1105 11/29/18  WBC 4.4 4.2 5.5  NEUTROABS  --   --  4  HGB 11.7* 12.0 11.8*  HCT 36.7 38.2 34*  MCV 93.1 94.6  --   PLT 213 192 202   Lab Results  Component Value Date   TSH 0.24 (A) 11/29/2018   Lab Results  Component Value Date   HGBA1C 5.5 11/29/2018   Lab Results  Component Value Date   CHOL 145 05/01/2018   HDL 48 05/01/2018   LDLCALC 78 05/01/2018   TRIG 96 05/01/2018   CHOLHDL 3.2 06/22/2013    Significant Diagnostic Results in last 30 days:  No results found.  Assessment/Plan  #1 history of dementia-at this point continue supportive care her weight appears to Jamilah relatively stabilized-she has been started on Lexapro for concerns of depression she is not crying today nursing has not reported any recent acute crying episodes but this will have to Saory  watched.  2.  Depression please see above she is now on Lexapro.  3.  History of CHF she continues on Lasix 40 mg a day with potassium supplementation I do not really see evidence of  fluid overload here or chest congestion this appears to [place] stable.  4.-History of hypertension this appears stable only on Lasix recent blood pressures 146/76-109/68-usually in this range.  5.  History of type 2 diabetes this is diet-controlled hemoglobin A1c was 5.5 in July we will try to update this.  6.  History of hyperlipidemia she is not on any medication for this nonetheless appears to Lettie stable with an LDL of 87 back in July.  7.  History of neuropathy she is on Neurontin 300 mg nightly.  8.  History of chronic back pain she is on tramadol 50 mg twice daily this at this point appears relatively well controlled.  9.  Anemia she is on iron hemoglobin was 12.5 on lab last month this appears to Benedetta stable will monitor periodically  [phone]

## 2019-03-29 DIAGNOSIS — Z79899 Other long term (current) drug therapy: Secondary | ICD-10-CM | POA: Diagnosis not present

## 2019-03-29 DIAGNOSIS — E119 Type 2 diabetes mellitus without complications: Secondary | ICD-10-CM | POA: Diagnosis not present

## 2019-03-29 DIAGNOSIS — M6281 Muscle weakness (generalized): Secondary | ICD-10-CM | POA: Diagnosis not present

## 2019-03-29 DIAGNOSIS — I110 Hypertensive heart disease with heart failure: Secondary | ICD-10-CM | POA: Diagnosis not present

## 2019-03-29 DIAGNOSIS — M810 Age-related osteoporosis without current pathological fracture: Secondary | ICD-10-CM | POA: Diagnosis not present

## 2019-04-01 DIAGNOSIS — M810 Age-related osteoporosis without current pathological fracture: Secondary | ICD-10-CM | POA: Diagnosis not present

## 2019-04-01 DIAGNOSIS — M6281 Muscle weakness (generalized): Secondary | ICD-10-CM | POA: Diagnosis not present

## 2019-04-01 DIAGNOSIS — I110 Hypertensive heart disease with heart failure: Secondary | ICD-10-CM | POA: Diagnosis not present

## 2019-04-02 DIAGNOSIS — M6281 Muscle weakness (generalized): Secondary | ICD-10-CM | POA: Diagnosis not present

## 2019-04-02 DIAGNOSIS — I110 Hypertensive heart disease with heart failure: Secondary | ICD-10-CM | POA: Diagnosis not present

## 2019-04-02 DIAGNOSIS — M810 Age-related osteoporosis without current pathological fracture: Secondary | ICD-10-CM | POA: Diagnosis not present

## 2019-04-03 DIAGNOSIS — M6281 Muscle weakness (generalized): Secondary | ICD-10-CM | POA: Diagnosis not present

## 2019-04-03 DIAGNOSIS — M810 Age-related osteoporosis without current pathological fracture: Secondary | ICD-10-CM | POA: Diagnosis not present

## 2019-04-03 DIAGNOSIS — I110 Hypertensive heart disease with heart failure: Secondary | ICD-10-CM | POA: Diagnosis not present

## 2019-04-04 DIAGNOSIS — I110 Hypertensive heart disease with heart failure: Secondary | ICD-10-CM | POA: Diagnosis not present

## 2019-04-04 DIAGNOSIS — M810 Age-related osteoporosis without current pathological fracture: Secondary | ICD-10-CM | POA: Diagnosis not present

## 2019-04-04 DIAGNOSIS — M6281 Muscle weakness (generalized): Secondary | ICD-10-CM | POA: Diagnosis not present

## 2019-04-05 DIAGNOSIS — M810 Age-related osteoporosis without current pathological fracture: Secondary | ICD-10-CM | POA: Diagnosis not present

## 2019-04-05 DIAGNOSIS — M6281 Muscle weakness (generalized): Secondary | ICD-10-CM | POA: Diagnosis not present

## 2019-04-05 DIAGNOSIS — I110 Hypertensive heart disease with heart failure: Secondary | ICD-10-CM | POA: Diagnosis not present

## 2019-04-08 DIAGNOSIS — M810 Age-related osteoporosis without current pathological fracture: Secondary | ICD-10-CM | POA: Diagnosis not present

## 2019-04-08 DIAGNOSIS — M6281 Muscle weakness (generalized): Secondary | ICD-10-CM | POA: Diagnosis not present

## 2019-04-08 DIAGNOSIS — I110 Hypertensive heart disease with heart failure: Secondary | ICD-10-CM | POA: Diagnosis not present

## 2019-04-09 DIAGNOSIS — I110 Hypertensive heart disease with heart failure: Secondary | ICD-10-CM | POA: Diagnosis not present

## 2019-04-09 DIAGNOSIS — M810 Age-related osteoporosis without current pathological fracture: Secondary | ICD-10-CM | POA: Diagnosis not present

## 2019-04-09 DIAGNOSIS — M6281 Muscle weakness (generalized): Secondary | ICD-10-CM | POA: Diagnosis not present

## 2019-04-10 DIAGNOSIS — I110 Hypertensive heart disease with heart failure: Secondary | ICD-10-CM | POA: Diagnosis not present

## 2019-04-10 DIAGNOSIS — M6281 Muscle weakness (generalized): Secondary | ICD-10-CM | POA: Diagnosis not present

## 2019-04-10 DIAGNOSIS — M810 Age-related osteoporosis without current pathological fracture: Secondary | ICD-10-CM | POA: Diagnosis not present

## 2019-04-11 DIAGNOSIS — M6281 Muscle weakness (generalized): Secondary | ICD-10-CM | POA: Diagnosis not present

## 2019-04-11 DIAGNOSIS — I110 Hypertensive heart disease with heart failure: Secondary | ICD-10-CM | POA: Diagnosis not present

## 2019-04-11 DIAGNOSIS — Z23 Encounter for immunization: Secondary | ICD-10-CM | POA: Diagnosis not present

## 2019-04-11 DIAGNOSIS — M810 Age-related osteoporosis without current pathological fracture: Secondary | ICD-10-CM | POA: Diagnosis not present

## 2019-04-12 DIAGNOSIS — M810 Age-related osteoporosis without current pathological fracture: Secondary | ICD-10-CM | POA: Diagnosis not present

## 2019-04-12 DIAGNOSIS — I110 Hypertensive heart disease with heart failure: Secondary | ICD-10-CM | POA: Diagnosis not present

## 2019-04-12 DIAGNOSIS — M6281 Muscle weakness (generalized): Secondary | ICD-10-CM | POA: Diagnosis not present

## 2019-04-15 DIAGNOSIS — I110 Hypertensive heart disease with heart failure: Secondary | ICD-10-CM | POA: Diagnosis not present

## 2019-04-15 DIAGNOSIS — M6281 Muscle weakness (generalized): Secondary | ICD-10-CM | POA: Diagnosis not present

## 2019-04-15 DIAGNOSIS — M810 Age-related osteoporosis without current pathological fracture: Secondary | ICD-10-CM | POA: Diagnosis not present

## 2019-04-16 DIAGNOSIS — M6281 Muscle weakness (generalized): Secondary | ICD-10-CM | POA: Diagnosis not present

## 2019-04-16 DIAGNOSIS — M810 Age-related osteoporosis without current pathological fracture: Secondary | ICD-10-CM | POA: Diagnosis not present

## 2019-04-16 DIAGNOSIS — I110 Hypertensive heart disease with heart failure: Secondary | ICD-10-CM | POA: Diagnosis not present

## 2019-04-17 DIAGNOSIS — I110 Hypertensive heart disease with heart failure: Secondary | ICD-10-CM | POA: Diagnosis not present

## 2019-04-17 DIAGNOSIS — M6281 Muscle weakness (generalized): Secondary | ICD-10-CM | POA: Diagnosis not present

## 2019-04-17 DIAGNOSIS — M810 Age-related osteoporosis without current pathological fracture: Secondary | ICD-10-CM | POA: Diagnosis not present

## 2019-04-18 DIAGNOSIS — I110 Hypertensive heart disease with heart failure: Secondary | ICD-10-CM | POA: Diagnosis not present

## 2019-04-18 DIAGNOSIS — M810 Age-related osteoporosis without current pathological fracture: Secondary | ICD-10-CM | POA: Diagnosis not present

## 2019-04-18 DIAGNOSIS — M6281 Muscle weakness (generalized): Secondary | ICD-10-CM | POA: Diagnosis not present

## 2019-04-19 DIAGNOSIS — M6281 Muscle weakness (generalized): Secondary | ICD-10-CM | POA: Diagnosis not present

## 2019-04-19 DIAGNOSIS — M810 Age-related osteoporosis without current pathological fracture: Secondary | ICD-10-CM | POA: Diagnosis not present

## 2019-04-19 DIAGNOSIS — I110 Hypertensive heart disease with heart failure: Secondary | ICD-10-CM | POA: Diagnosis not present

## 2019-04-22 DIAGNOSIS — I110 Hypertensive heart disease with heart failure: Secondary | ICD-10-CM | POA: Diagnosis not present

## 2019-04-22 DIAGNOSIS — M810 Age-related osteoporosis without current pathological fracture: Secondary | ICD-10-CM | POA: Diagnosis not present

## 2019-04-22 DIAGNOSIS — M6281 Muscle weakness (generalized): Secondary | ICD-10-CM | POA: Diagnosis not present

## 2019-04-26 IMAGING — RF DG SWALLOWING FUNCTION
11 series · 24 of 24 positions shown · non-contrast
Comparison: None.

CLINICAL DATA: Dysphagia.

EXAM:
MODIFIED BARIUM SWALLOW
TECHNIQUE: Different consistencies of barium were administered orally to the
patient by the Speech Pathologist. Imaging of the pharynx was
performed in the lateral projection.
FLUOROSCOPY TIME:  Fluoroscopy Time:  1 minutes and 12 seconds.

[Series 1: cp_standard · 0.36mm/px · 2 of 47 frames shown (1 of 11)]
[frame 8/47]
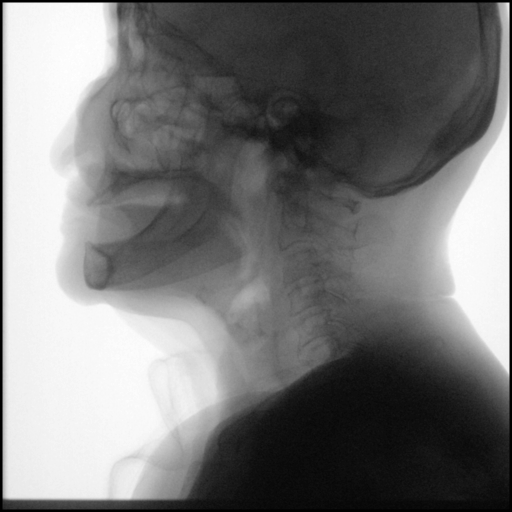
[frame 25/47]
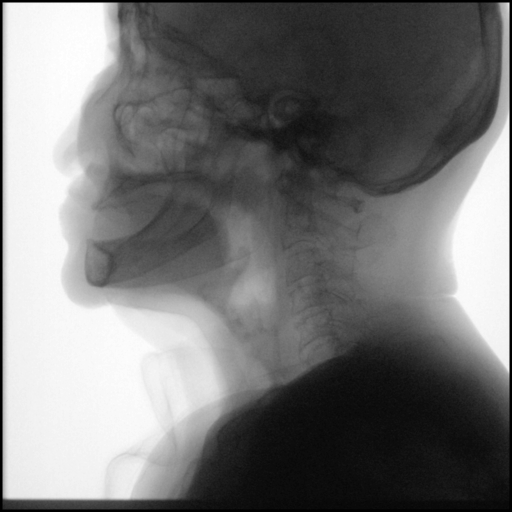

[Series 2: cp_standard · 0.35mm/px · 3 of 25 frames shown (2 of 11)]
[frame 4/25]
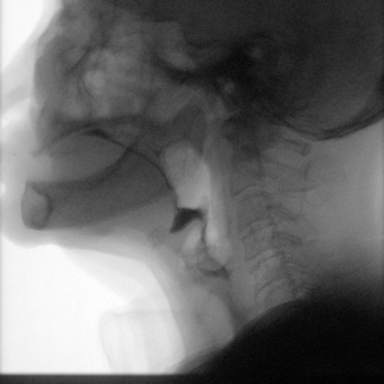
[frame 13/25]
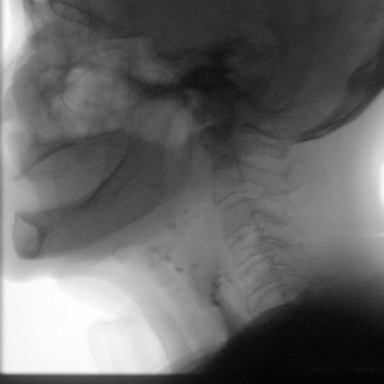
[frame 22/25]
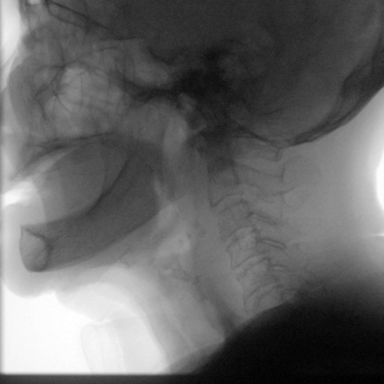

[Series 3: cp_standard · 0.35mm/px · 2 of 106 frames shown (3 of 11)]
[frame 32/106]
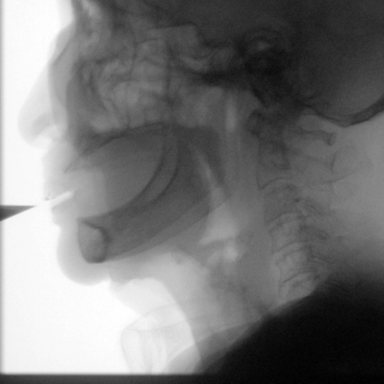
[frame 91/106]
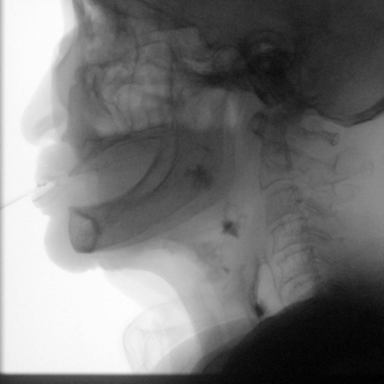

[Series 4: cp_standard · 0.35mm/px · 2 of 50 frames shown (4 of 11)]
[frame 26/50]
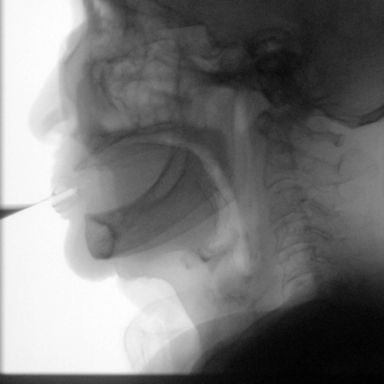
[frame 49/50]
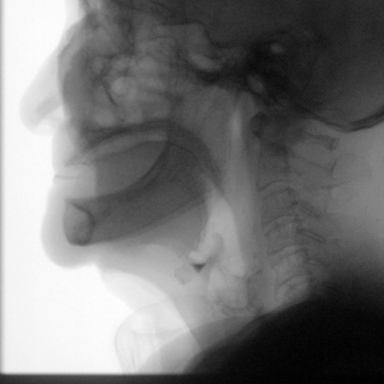

[Series 5: cp_standard · 0.35mm/px · 2 of 29 frames shown (5 of 11)]
[frame 15/29]
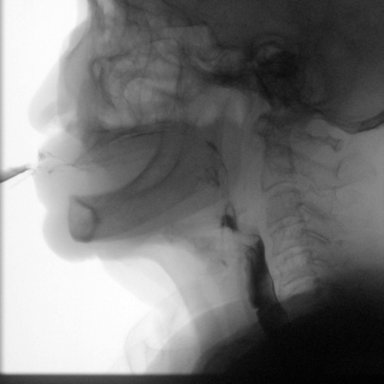
[frame 29/29]
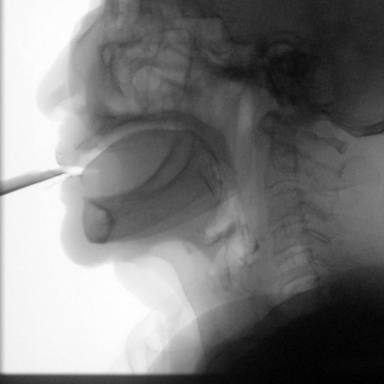

[Series 6: cp_standard · 0.35mm/px · 2 of 66 frames shown (6 of 11)]
[frame 10/66]
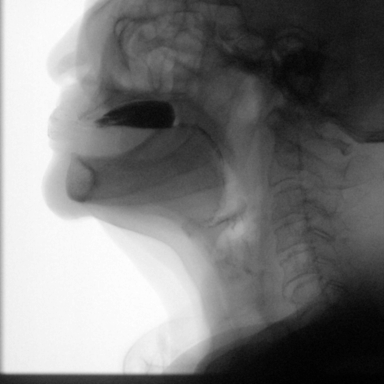
[frame 34/66]
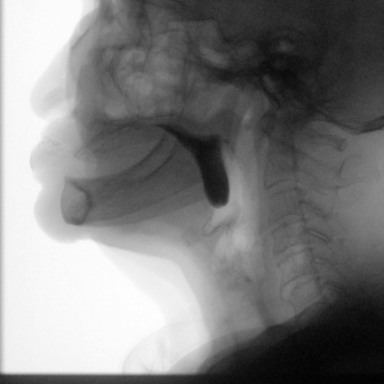

[Series 7: cp_standard · 0.35mm/px · 2 of 32 frames shown (7 of 11)]
[frame 4/32]
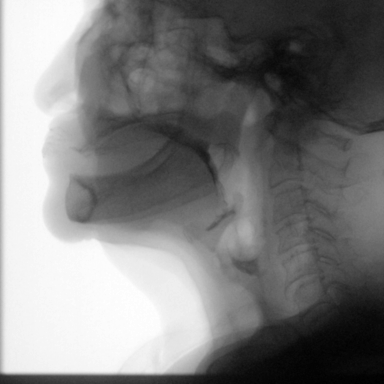
[frame 17/32]
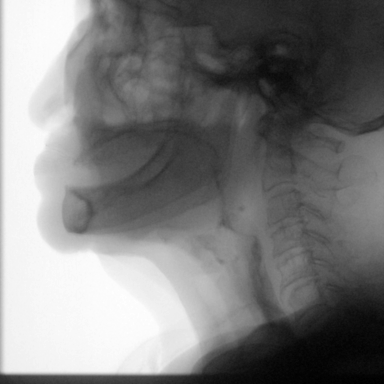

[Series 8: cp_standard · 0.35mm/px · 2 of 109 frames shown (8 of 11)]
[frame 17/109]
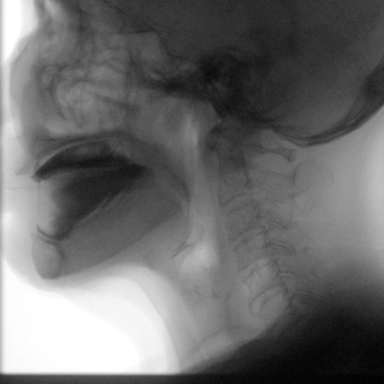
[frame 55/109]
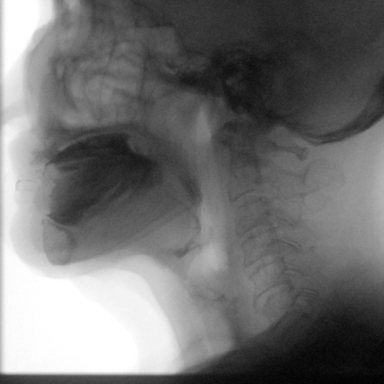

[Series 9: cp_standard · 0.35mm/px · 2 of 56 frames shown (9 of 11)]
[frame 1/56]
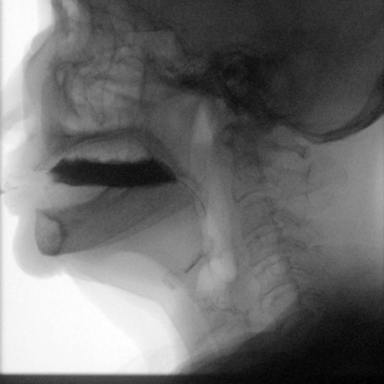
[frame 29/56]
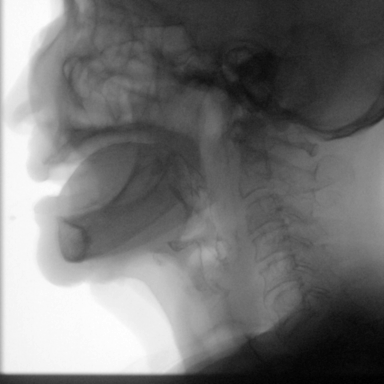

[Series 10: cp_standard · 0.35mm/px · 3 of 29 frames shown (10 of 11)]
[frame 5/29]
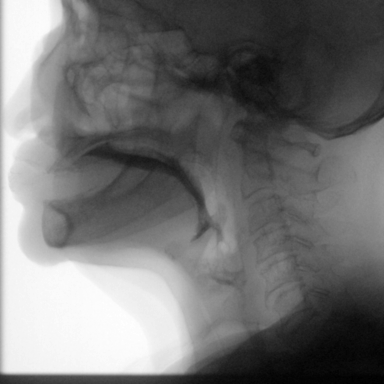
[frame 15/29]
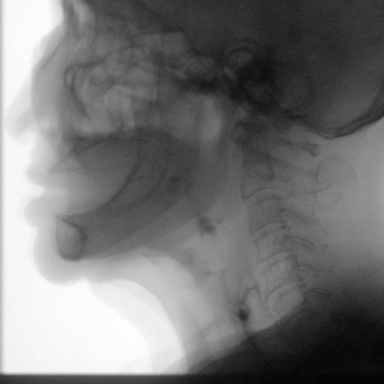
[frame 29/29]
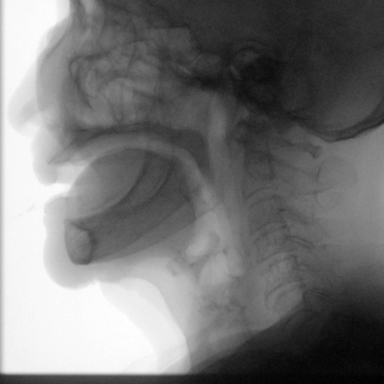

[Series 11: cp_standard · 0.35mm/px · 2 of 137 frames shown (11 of 11)]
[frame 52/137]
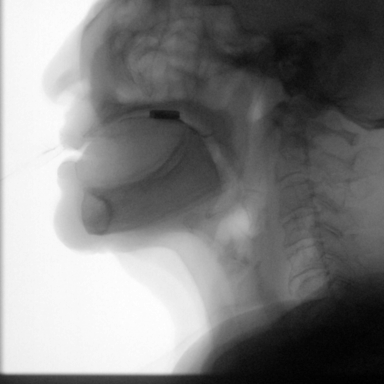
[frame 117/137]
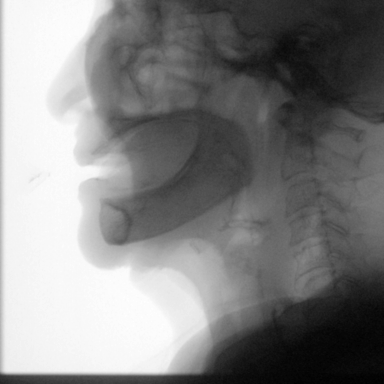

[24 of 24 positions shown; findings below may reference images not displayed]

FINDINGS: Thin liquid- flash laryngeal penetration.

Nectar thick liquid- within normal limits

Honey- within normal limits

Plur?Gabay within normal limits

Cracker-within normal limits

Plur?Mazalin with cracker- within normal limits

Barium tablet -  within normal limits
IMPRESSION: Flash laryngeal penetration with thin liquids.

No evidence of frank aspiration.

Please refer to the Speech Pathologists report for complete details
and recommendations.

## 2019-04-29 ENCOUNTER — Encounter: Payer: Self-pay | Admitting: Internal Medicine

## 2019-04-29 ENCOUNTER — Non-Acute Institutional Stay (SKILLED_NURSING_FACILITY): Payer: Medicare Other | Admitting: Internal Medicine

## 2019-04-29 DIAGNOSIS — E785 Hyperlipidemia, unspecified: Secondary | ICD-10-CM

## 2019-04-29 DIAGNOSIS — E1169 Type 2 diabetes mellitus with other specified complication: Secondary | ICD-10-CM

## 2019-04-29 DIAGNOSIS — E1142 Type 2 diabetes mellitus with diabetic polyneuropathy: Secondary | ICD-10-CM

## 2019-04-29 DIAGNOSIS — I5032 Chronic diastolic (congestive) heart failure: Secondary | ICD-10-CM

## 2019-04-29 NOTE — Progress Notes (Signed)
Location:  [facility] Room Number: 201-D Place of Service:  SNF (31)  Margaret [name], MD  Patient Care Team: Margaret [name], MD as PCP - General (Internal Medicine)  Extended Emergency Contact Information Primary Emergency Contact: [name],Margaret Address: [address] [place], [place] [address] Margaret [name] of [place] Phone: [phone] Mobile Phone: [phone] Relation: Sister Secondary Emergency Contact: Margaret,[name] Address: [address]          [place], [place] [address] Margaret [name] of [place] Phone: [phone] Relation: Granddaughter    Allergies: Tequin [gatifloxacin]  Chief Complaint  Patient presents with  . Medical Management of Chronic Issues    Routine [facility] visit    HPI: Patient is an 85 y.o. female who is being seen for routine issues of chronic diastolic congestive heart failure, hyperlipidemia, and neuropathy.  Past Medical History:  Diagnosis Date  . Breast mass, right 12/13/2013  . Chronic diastolic CHF (congestive heart failure) ([place]) 07/19/2013  . Chronic diastolic heart failure ([place])   . Chronic kidney disease, stage II (mild)   . Chronic renal disease, stage 2, mildly decreased glomerular filtration rate (GFR) between 60-89 mL/min/1.73 square meter 09/03/2014  . Closed T12 fracture ([place]) 07/16/2013  . Depression 07/18/2013  . Diabetes mellitus   . Diabetes mellitus due to underlying condition ([place]) 06/21/2013  . Diabetes mellitus without complication ([place])   . Dysphagia, oropharyngeal phase   . GERD (gastroesophageal reflux disease) 12/17/2014  . Gout   . Hypertension   . Hypertensive heart disease with congestive heart failure ([place]) 06/21/2013  . Iron deficiency anemia 09/03/2014  . Iron deficiency anemia, unspecified   . Leukocytosis 07/18/2013  . Lumbago 10/23/2013  . Lung nodule < 6cm on CT 11/13/2016  . Osteopenia 07/16/2013  . Syncope 06/21/2013  . Type 2 diabetes,  controlled, with neuropathy ([place]) 10/18/2013  . Unspecified constipation 10/18/2013  . Vitamin D deficiency 04/11/2016    Past Surgical History:  Procedure Laterality Date  . NO PAST SURGERIES      Allergies as of 04/29/2019      Reactions   Tequin [gatifloxacin] Other (See Comments)   Unknown rxn per [facility]      Medication List       Accurate as of April 29, 2019 11:59 PM. If you have any questions, ask your nurse or doctor.        aspirin 81 MG chewable tablet Chew 1 tablet (81 mg total) by mouth daily.   escitalopram 10 MG tablet Commonly known as: LEXAPRO Take 10 mg by mouth daily.   ferrous sulfate 325 (65 FE) MG tablet Take 325 mg by mouth daily with breakfast.   furosemide 40 MG tablet Commonly known as: LASIX Take 40 mg by mouth every morning.   gabapentin 300 MG capsule Commonly known as: NEURONTIN Take 1 capsule (300 mg total) by mouth at bedtime.   Nutritional Supplement Liqd Take 1 each by mouth every evening. "Magic Cup" - Take one cup with dinner as supplement   potassium chloride 10 MEQ tablet Commonly known as: KLOR-CON Take 30 mEq by mouth daily. 3 tabs to = 30 mEq   traMADol 50 MG tablet Commonly known as: ULTRAM Take 1 tablet (50 mg total) by mouth 2 (two) times daily.   Vitamin D3 1.25 MG (50000 UT) Caps Take 1 capsule by mouth once a week.       No  orders of the defined types were placed in this encounter.   Immunization History  Administered Date(s) Administered  . Influenza-Unspecified 04/06/2017, 03/13/2018, 04/12/2019  . PPD Test 08/12/2013  . Pneumococcal Conjugate-13 12/12/2017  . Pneumococcal Polysaccharide-23 06/23/2013  . Tdap 12/11/2017    Social History   Tobacco Use  . Smoking status: Former Games developermoker  . Smokeless tobacco: Never Used  Substance Use Topics  . Alcohol use: No    Review of Systems  DATA OBTAINED: from nurse GENERAL:  no fevers, fatigue, appetite changes SKIN: No itching, rash HEENT: No  complaint RESPIRATORY: No cough, wheezing, SOB CARDIAC: No chest pain, palpitations, lower extremity edema  GI: No abdominal pain, No N/V/D or constipation, No heartburn or reflux  GU: No dysuria, frequency or urgency, or incontinence  MUSCULOSKELETAL: No unrelieved bone/joint pain NEUROLOGIC: No headache, dizziness  PSYCHIATRIC: No overt anxiety or sadness  Vitals:   04/29/19 1413  BP: 132/72  Pulse: 68  Resp: 17  Temp: 98.9 F (37.2 C)   Body mass index is 18.63 kg/m. Physical Exam  GENERAL APPEARANCE: Alert,  No acute distress  SKIN: No diaphoresis rash HEENT: Unremarkable RESPIRATORY: Breathing is even, unlabored. Lung sounds are clear   CARDIOVASCULAR: Heart RRR no murmurs, rubs or gallops. No peripheral edema  GASTROINTESTINAL: Abdomen is soft, non-tender, not distended w/ normal bowel sounds.  GENITOURINARY: Bladder non tender, not distended  MUSCULOSKELETAL: No abnormal joints or musculature NEUROLOGIC: Cranial nerves 2-12 grossly intact. Moves all extremities PSYCHIATRIC: Mood and affect with dementia, no behavioral issues  Patient Active Problem List   Diagnosis Date Noted  . Real time reverse transcriptase PCR positive for COVID-19 virus 01/16/2019  . Suicidal ideation 12/08/2018  . Hyperlipidemia associated with type 2 diabetes mellitus (HCC) 06/10/2018  . Lung nodule < 6cm on CT 11/13/2016  . Vitamin D deficiency 04/11/2016  . UTI (urinary tract infection) 03/24/2015  . GERD (gastroesophageal reflux disease) 12/17/2014  . Hypokalemia 12/17/2014  . Depression 11/25/2014  . Iron deficiency anemia 09/03/2014  . Breast mass, right 12/13/2013  . Lumbago 10/23/2013  . Type 2 diabetes, controlled, with neuropathy (HCC) 10/18/2013  . 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 Diabetes mellitus, controlled (HCC) 07/19/2013  . Chronic diastolic CHF (congestive heart failure) (HCC) 07/19/2013  . Leukocytosis 06/21/2013  .  Hypertensive heart disease with congestive heart failure (HCC) 06/21/2013  . Diabetes mellitus due to underlying condition (HCC) 06/21/2013    CMP     Component Value Date/Time   NA 143 07/21/2018 1105   NA 141 05/14/2018   K 3.5 07/21/2018 1105   CL 106 07/21/2018 1105   CO2 28 07/21/2018 1105   GLUCOSE 109 (H) 07/21/2018 1105   BUN 17 07/21/2018 1105   BUN 22 (A) 05/14/2018   CREATININE 0.95 07/21/2018 1105   CALCIUM 9.3 07/21/2018 1105   PROT 7.6 07/21/2018 1105   ALBUMIN 4.0 07/21/2018 1105   AST 20 07/21/2018 1105   ALT 13 07/21/2018 1105   ALKPHOS 67 07/21/2018 1105   BILITOT 0.7 07/21/2018 1105   GFRNONAA 55 (L) 07/21/2018 1105   GFRAA >60 07/21/2018 1105   Recent Labs    05/14/18 07/09/18 1949 07/21/18 1105  NA 141 142 143  K 4.6 3.6 3.5  CL  --  102 106  CO2  --  29 28  GLUCOSE  --  120* 109*  BUN 22* 30* 17  CREATININE 1.1 1.47* 0.95  CALCIUM  --  8.8* 9.3  Recent Labs    07/09/18 1949 07/21/18 1105  AST 18 20  ALT 11 13  ALKPHOS 63 67  BILITOT 0.5 0.7  PROT 6.9 7.6  ALBUMIN 3.5 4.0   Recent Labs    07/09/18 1949 07/21/18 1105 11/29/18  WBC 4.4 4.2 5.5  NEUTROABS  --   --  4  HGB 11.7* 12.0 11.8*  HCT 36.7 38.2 34*  MCV 93.1 94.6  --   PLT 213 192 202   No results for input(s): CHOL, LDLCALC, TRIG in the last 8760 hours.  Invalid input(s): HCL Lab Results  Component Value Date   MICROALBUR 1.2 12/01/2018   Lab Results  Component Value Date   TSH 0.24 (A) 11/29/2018   Lab Results  Component Value Date   HGBA1C 5.5 11/29/2018   Lab Results  Component Value Date   CHOL 145 05/01/2018   HDL 48 05/01/2018   LDLCALC 78 05/01/2018   TRIG 96 05/01/2018   CHOLHDL 3.2 06/22/2013    Significant Diagnostic Results in last 30 days:  No results found.  Assessment and Plan  Chronic diastolic CHF (congestive heart failure) No reported exacerbation; continue Lasix 40 mg daily  Hyperlipidemia associated with type 2 diabetes  mellitus (HCC) LDL 78 on no medications; this is very acceptable; monitor at intervals  Peripheral sensory neuropathy due to type 2 diabetes mellitus Continues without reports of pain; continue Neurontin 300 mg nightly     [name], MD

## 2019-05-04 ENCOUNTER — Encounter: Payer: Self-pay | Admitting: Internal Medicine

## 2019-05-04 NOTE — Assessment & Plan Note (Signed)
No reported exacerbation; continue Lasix 40 mg daily 

## 2019-05-04 NOTE — Assessment & Plan Note (Signed)
LDL 78 on no medications; this is very acceptable; monitor at intervals

## 2019-05-04 NOTE — Assessment & Plan Note (Signed)
Continues without reports of pain; continue Neurontin 300 mg nightly

## 2019-05-06 DIAGNOSIS — E559 Vitamin D deficiency, unspecified: Secondary | ICD-10-CM | POA: Diagnosis not present

## 2019-05-06 DIAGNOSIS — I10 Essential (primary) hypertension: Secondary | ICD-10-CM | POA: Diagnosis not present

## 2019-05-06 LAB — VITAMIN D 25 HYDROXY (VIT D DEFICIENCY, FRACTURES): Vit D, 25-Hydroxy: 20.68

## 2019-05-12 ENCOUNTER — Other Ambulatory Visit: Payer: Self-pay

## 2019-05-12 ENCOUNTER — Emergency Department (HOSPITAL_COMMUNITY): Payer: Medicare Other

## 2019-05-12 ENCOUNTER — Emergency Department (HOSPITAL_COMMUNITY)
Admission: EM | Admit: 2019-05-12 | Discharge: 2019-05-12 | Disposition: A | Discharge status: Home / Self Care | Payer: Medicare Other | Attending: Emergency Medicine | Admitting: Emergency Medicine

## 2019-05-12 ENCOUNTER — Encounter (HOSPITAL_COMMUNITY): Payer: Self-pay

## 2019-05-12 DIAGNOSIS — S0990XA Unspecified injury of head, initial encounter: Secondary | ICD-10-CM | POA: Diagnosis not present

## 2019-05-12 DIAGNOSIS — W19XXXA Unspecified fall, initial encounter: Secondary | ICD-10-CM | POA: Diagnosis not present

## 2019-05-12 DIAGNOSIS — Y999 Unspecified external cause status: Secondary | ICD-10-CM | POA: Diagnosis not present

## 2019-05-12 DIAGNOSIS — I6782 Cerebral ischemia: Secondary | ICD-10-CM | POA: Diagnosis not present

## 2019-05-12 DIAGNOSIS — Y92122 Bedroom in nursing home as the place of occurrence of the external cause: Secondary | ICD-10-CM | POA: Insufficient documentation

## 2019-05-12 DIAGNOSIS — I130 Hypertensive heart and chronic kidney disease with heart failure and stage 1 through stage 4 chronic kidney disease, or unspecified chronic kidney disease: Secondary | ICD-10-CM | POA: Insufficient documentation

## 2019-05-12 DIAGNOSIS — G9389 Other specified disorders of brain: Secondary | ICD-10-CM | POA: Insufficient documentation

## 2019-05-12 DIAGNOSIS — Y939 Activity, unspecified: Secondary | ICD-10-CM | POA: Diagnosis not present

## 2019-05-12 DIAGNOSIS — I5032 Chronic diastolic (congestive) heart failure: Secondary | ICD-10-CM | POA: Insufficient documentation

## 2019-05-12 DIAGNOSIS — N182 Chronic kidney disease, stage 2 (mild): Secondary | ICD-10-CM | POA: Insufficient documentation

## 2019-05-12 DIAGNOSIS — L539 Erythematous condition, unspecified: Secondary | ICD-10-CM | POA: Insufficient documentation

## 2019-05-12 DIAGNOSIS — R52 Pain, unspecified: Secondary | ICD-10-CM | POA: Diagnosis not present

## 2019-05-12 DIAGNOSIS — Z87891 Personal history of nicotine dependence: Secondary | ICD-10-CM | POA: Insufficient documentation

## 2019-05-12 DIAGNOSIS — Z7982 Long term (current) use of aspirin: Secondary | ICD-10-CM | POA: Insufficient documentation

## 2019-05-12 DIAGNOSIS — Z7401 Bed confinement status: Secondary | ICD-10-CM | POA: Diagnosis not present

## 2019-05-12 DIAGNOSIS — W06XXXA Fall from bed, initial encounter: Secondary | ICD-10-CM | POA: Insufficient documentation

## 2019-05-12 DIAGNOSIS — M25551 Pain in right hip: Secondary | ICD-10-CM | POA: Insufficient documentation

## 2019-05-12 DIAGNOSIS — Z8619 Personal history of other infectious and parasitic diseases: Secondary | ICD-10-CM | POA: Diagnosis not present

## 2019-05-12 DIAGNOSIS — E1122 Type 2 diabetes mellitus with diabetic chronic kidney disease: Secondary | ICD-10-CM | POA: Diagnosis not present

## 2019-05-12 DIAGNOSIS — S199XXA Unspecified injury of neck, initial encounter: Secondary | ICD-10-CM | POA: Diagnosis not present

## 2019-05-12 DIAGNOSIS — Z79899 Other long term (current) drug therapy: Secondary | ICD-10-CM | POA: Diagnosis not present

## 2019-05-12 DIAGNOSIS — M2550 Pain in unspecified joint: Secondary | ICD-10-CM | POA: Diagnosis not present

## 2019-05-12 DIAGNOSIS — R5381 Other malaise: Secondary | ICD-10-CM | POA: Diagnosis not present

## 2019-05-12 DIAGNOSIS — S79911A Unspecified injury of right hip, initial encounter: Secondary | ICD-10-CM | POA: Diagnosis not present

## 2019-05-12 NOTE — ED Triage Notes (Signed)
Pt arrives via [facility] c/o right hip pain after experiencing a fall from her bed at [facility]. Fall was unwitnessed, patient found on left side in floor adjacent to bed by staff. Patient is A&O at baseline per EMS. VS: BP 140/78, HR 78. RR 18, SPO2 96% RA, CBG 109.

## 2019-05-12 NOTE — ED Notes (Signed)
[facility] called  

## 2019-05-12 NOTE — ED Provider Notes (Signed)
[facility] EMERGENCY DEPARTMENT Provider Note   CSN: [id number] Arrival date & time: 05/12/19  0359     History   Chief Complaint Chief Complaint  Patient presents with   Fall    HPI Margaret [name] is a 85 y.o. female.     85 yo F with a chief complaint of a fall out of bed.  This happens overnight.  Patient was found on the ground.  Complaining of some right hip pain.  Patient is at her baseline per the nursing facility.  There is a language barrier and the patient has some difficulty communicating with me.  Level 5 caveat.  The history is provided by the patient.  Fall This is a new problem. The problem occurs constantly. The problem has not changed since onset.Pertinent negatives include no chest pain, no abdominal pain, no headaches and no shortness of breath. Nothing aggravates the symptoms. Nothing relieves the symptoms. She has tried nothing for the symptoms. The treatment provided no relief.    Past Medical History:  Diagnosis Date   Breast mass, right 12/13/2013   Chronic diastolic CHF (congestive heart failure) (HCC) 07/19/2013   Chronic diastolic heart failure (HCC)    Chronic kidney disease, stage II (mild)    Chronic renal disease, stage 2, mildly decreased glomerular filtration rate (GFR) between 60-89 mL/min/1.73 square meter 09/03/2014   Closed T12 fracture (HCC) 07/16/2013   Depression 07/18/2013   Diabetes mellitus    Diabetes mellitus due to underlying condition (HCC) 06/21/2013   Diabetes mellitus without complication (HCC)    Dysphagia, oropharyngeal phase    GERD (gastroesophageal reflux disease) 12/17/2014   Gout    Hypertension    Hypertensive heart disease with congestive heart failure (HCC) 06/21/2013   Iron deficiency anemia 09/03/2014   Iron deficiency anemia, unspecified    Leukocytosis 07/18/2013   Lumbago 10/23/2013   Lung nodule < 6cm on CT 11/13/2016   Osteopenia 07/16/2013   Syncope 06/21/2013   Type 2  diabetes, controlled, with neuropathy (HCC) 10/18/2013   Unspecified constipation 10/18/2013   Vitamin D deficiency 04/11/2016    Patient Active Problem List   Diagnosis Date Noted   Real time reverse transcriptase PCR positive for COVID-19 virus 01/16/2019   Suicidal ideation 12/08/2018   Hyperlipidemia associated with type 2 diabetes mellitus (HCC) 06/10/2018   Lung nodule < 6cm on CT 11/13/2016   Vitamin D deficiency 04/11/2016   UTI (urinary tract infection) 03/24/2015   GERD (gastroesophageal reflux disease) 12/17/2014   Hypokalemia 12/17/2014   Depression 11/25/2014   Iron deficiency anemia 09/03/2014   Breast mass, right 12/13/2013   Lumbago 10/23/2013   Type 2 diabetes, controlled, with neuropathy (HCC) 10/18/2013   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      Home Medications    Prior to Admission medications   Medication Sig Start Date End Date Taking? Authorizing Provider  aspirin 81 MG chewable tablet Chew 1 tablet (81 mg total) by mouth daily. 06/24/13  Yes Hollice Espy, MD  bisacodyl (DULCOLAX) 10  MG suppository Place 10 mg rectally as needed for moderate constipation.   Yes [provider]  Cholecalciferol (VITAMIN D3) 1.25 MG (50000 UT) CAPS Take 1 capsule by mouth every Tuesday.    Yes [provider]  escitalopram (LEXAPRO) 10 MG tablet Take 10 mg by mouth daily.   Yes [provider]  ferrous sulfate 325 (65 FE) MG tablet Take 325 mg by mouth daily with  breakfast.    Yes [provider]  furosemide (LASIX) 40 MG tablet Take 40 mg by mouth daily.  04/08/13  Yes [provider]  gabapentin (NEURONTIN) 300 MG capsule Take 1 capsule (300 mg total) by mouth at bedtime. 07/22/13  Yes Tat, Onalee Huaavid, MD  magnesium hydroxide (MILK OF MAGNESIA) 400 MG/5ML suspension Take 30 mLs by mouth daily as needed for mild constipation.   Yes [provider]  NUTRITIONAL SUPPLEMENT LIQD Take 1 each by mouth every evening. "Magic Cup" - Take one cup with dinner as supplement   Yes [provider]  potassium chloride (K-DUR) 10 MEQ tablet Take 30 mEq by mouth daily.    Yes [provider]  Sodium Phosphates (RA SALINE ENEMA) 19-7 GM/118ML ENEM Place 1 each rectally as needed (for constipation).   Yes [provider]  traMADol (ULTRAM) 50 MG tablet Take 1 tablet (50 mg total) by mouth 2 (two) times daily. 03/26/19  Yes [name], Anne D, MD    Family History History reviewed. No pertinent family history.  Social History Social History   Tobacco Use   Smoking status: Former Smoker   Smokeless tobacco: Never Used  Substance Use Topics   Alcohol use: No   Drug use: No     Allergies   Tequin [gatifloxacin]   Review of Systems Review of Systems  Unable to perform ROS: Dementia  Constitutional: Negative for chills and fever.  HENT: Negative for congestion and rhinorrhea.   Eyes: Negative for redness and visual disturbance.  Respiratory: Negative for shortness of breath and wheezing.   Cardiovascular: Negative for chest pain and palpitations.  Gastrointestinal: Negative for abdominal pain, nausea and vomiting.  Genitourinary: Negative for dysuria and urgency.  Musculoskeletal: Positive for arthralgias. Negative for myalgias.  Skin: Negative for pallor and wound.  Neurological: Negative for dizziness and headaches.     Physical Exam Updated Vital Signs BP 136/83    Pulse 71    Temp 97.6 F (36.4 C)  (Oral)    Resp 17    Ht 5' (1.524 m)    Wt 40 kg    LMP  (LMP Unknown)    SpO2 95%    BMI 17.22 kg/m   Physical Exam Vitals signs and nursing note reviewed.  Constitutional:      General: She is not in acute distress.    Appearance: She is well-developed. She is not diaphoretic.  HENT:     Head: Normocephalic.     Comments: Mild erythema to the right side of the face Eyes:     Pupils: Pupils are equal, round, and reactive to light.  Neck:     Musculoskeletal: Normal range of motion and neck supple.  Cardiovascular:     Rate and Rhythm: Normal rate and regular rhythm.     Heart sounds: No murmur. No friction rub. No gallop.   Pulmonary:     Effort: Pulmonary effort is normal.     Breath sounds: No wheezing or rales.  Abdominal:     General: There is no distension.  Palpations: Abdomen is soft.     Tenderness: There is no abdominal tenderness.  Musculoskeletal:        General: No tenderness.     Comments: Legs are contractured, no obvious pain with compression on internal or external rotation.    Skin:    General: Skin is warm and dry.  Neurological:     Mental Status: She is alert.      ED Treatments / Results  Labs (all labs ordered are listed, but only abnormal results are displayed) Labs Reviewed - No data to display  EKG None  Radiology Ct Head Wo Contrast  Result Date: 05/12/2019 CLINICAL DATA:  Unwitnessed fall. EXAM: CT HEAD WITHOUT CONTRAST CT CERVICAL SPINE WITHOUT CONTRAST TECHNIQUE: Multidetector CT imaging of the head and cervical spine was performed following the standard protocol without intravenous contrast. Multiplanar CT image reconstructions of the cervical spine were also generated. COMPARISON:  10/09/2016 FINDINGS: CT HEAD FINDINGS Brain: No evidence of acute infarction, hemorrhage, hydrocephalus, extra-axial collection or mass lesion/mass effect. Increased volume of CSF spaces compatible with brain atrophy. There is mild diffuse low-attenuation  within the subcortical and periventricular white matter compatible with chronic microvascular disease. Left cerebellar hemisphere encephalomalacia and volume loss. Vascular: No hyperdense vessel or unexpected calcification. Skull: Postsurgical changes within the right mastoid bone. No acute abnormality. Sinuses/Orbits: Hypoplastic mastoid air cells with left mastoid air cell effusion. Paranasal sinuses are clear. Other: None CT CERVICAL SPINE FINDINGS Alignment: Normal. Skull base and vertebrae: No acute fracture. No primary bone lesion or focal pathologic process. Soft tissues and spinal canal: No prevertebral fluid or swelling. No visible canal hematoma. Disc levels: Mild multilevel disc space narrowing and endplate spurring. Upper chest: Negative. Other: None IMPRESSION: 1. No acute intracranial abnormality. 2. Chronic small vessel ischemic changes and brain atrophy.Left cerebellar hemisphere encephalomalacia and volume loss. 3. No evidence for acute cervical spine fracture. Electronically Signed   By: Signa Kell M.D.   On: 05/12/2019 05:15   Ct Cervical Spine Wo Contrast  Result Date: 05/12/2019 CLINICAL DATA:  Unwitnessed fall. EXAM: CT HEAD WITHOUT CONTRAST CT CERVICAL SPINE WITHOUT CONTRAST TECHNIQUE: Multidetector CT imaging of the head and cervical spine was performed following the standard protocol without intravenous contrast. Multiplanar CT image reconstructions of the cervical spine were also generated. COMPARISON:  10/09/2016 FINDINGS: CT HEAD FINDINGS Brain: No evidence of acute infarction, hemorrhage, hydrocephalus, extra-axial collection or mass lesion/mass effect. Increased volume of CSF spaces compatible with brain atrophy. There is mild diffuse low-attenuation within the subcortical and periventricular white matter compatible with chronic microvascular disease. Left cerebellar hemisphere encephalomalacia and volume loss. Vascular: No hyperdense vessel or unexpected calcification. Skull:  Postsurgical changes within the right mastoid bone. No acute abnormality. Sinuses/Orbits: Hypoplastic mastoid air cells with left mastoid air cell effusion. Paranasal sinuses are clear. Other: None CT CERVICAL SPINE FINDINGS Alignment: Normal. Skull base and vertebrae: No acute fracture. No primary bone lesion or focal pathologic process. Soft tissues and spinal canal: No prevertebral fluid or swelling. No visible canal hematoma. Disc levels: Mild multilevel disc space narrowing and endplate spurring. Upper chest: Negative. Other: None IMPRESSION: 1. No acute intracranial abnormality. 2. Chronic small vessel ischemic changes and brain atrophy.Left cerebellar hemisphere encephalomalacia and volume loss. 3. No evidence for acute cervical spine fracture. Electronically Signed   By: Signa Kell M.D.   On: 05/12/2019 05:15   Dg Hip Unilat W Or Wo Pelvis 2-3 Views Right  Result Date: 05/12/2019 CLINICAL DATA:  Right hip pain status  post fall. EXAM: DG HIP (WITH OR WITHOUT PELVIS) 2-3V RIGHT COMPARISON:  07/20/2011 FINDINGS: The bones appear osteopenic. There is no acute fracture or subluxation identified. Chronic deformities involving the left superior and inferior pubic rami identified. IMPRESSION: 1. No acute findings. If there is high clinical suspicion for occult fracture or the patient refuses to weightbear, consider further evaluation with MRI. Although CT is expeditious, evidence is lacking regarding accuracy of CT over plain film radiography. 2. Chronic deformities involving the left superior and inferior pubic rami. Electronically Signed   By: Kerby Moors M.D.   On: 05/12/2019 05:26    Procedures Procedures (including critical care time)  Medications Ordered in ED Medications - No data to display   Initial Impression / Assessment and Plan / ED Course  I have reviewed the triage vital signs and the nursing notes.  Pertinent labs & imaging results that were available during my care of the  patient were reviewed by me and considered in my medical decision making (see chart for details).        85 yo F with a chief complaints of a fall out of bed.  Occurred sometime this evening.  Found on the floor by nursing staff.  At her baseline per the nursing staff.  Patient with some mild erythema to the right side of the face, may Kaeli struck her face on the floor.  Will obtain a CT of the head and C-spine.  Pain film of the right hip presents but was reported to [place] as the area of pain.  Plain film is negative is viewed by me for fracture.  CT of the head and C-spine are also negative.  Will discharge patient back to her facility.  5:33 AM:  I have discussed the diagnosis/risks/treatment options with the patient and believe the pt to Margaret [name] eligible for discharge home to follow-up with PCP. We also discussed returning to the ED immediately if new or worsening sx occur. We discussed the sx which are most concerning (e.g., sudden worsening pain, fever, inability to tolerate by mouth) that necessitate immediate return. Medications administered to the patient during their visit and any new prescriptions provided to the patient are listed below.  Medications given during this visit Medications - No data to display   The patient appears reasonably screen and/or stabilized for discharge and I doubt any other medical condition or other [facility] requiring further screening, evaluation, or treatment in the ED at this time prior to discharge.    Final Clinical Impressions(s) / ED Diagnoses   Final diagnoses:  Fall, initial encounter    ED Discharge Orders    None       Deno Etienne, DO 05/12/19 [id number]

## 2019-05-12 NOTE — Discharge Instructions (Signed)
Return for worsening pain, on pain that does not improve over the week.  Follow up with your family doc.

## 2019-05-12 NOTE — ED Notes (Signed)
Patient verbalizes understanding of discharge instructions. Opportunity for questioning and answers were provided. Armband removed by staff, pt discharged from ED.  

## 2019-05-13 ENCOUNTER — Telehealth: Payer: Self-pay | Admitting: Internal Medicine

## 2019-05-13 NOTE — Telephone Encounter (Signed)
Received call in the middle of the night Sat to Sun.  Resident had sustained an unwitnessed fall with unclear circumstances per staff.  Speaks [place] and staff were struggling to get history and neurochecks.  Vitals normal.  She was c/o headache and they requested to send her to the hospital for evaluation.  They had no documentation to indicate she should not Margaret [name] sent.  Order given to go to ED.

## 2019-05-15 DIAGNOSIS — J069 Acute upper respiratory infection, unspecified: Secondary | ICD-10-CM | POA: Diagnosis not present

## 2019-05-20 DIAGNOSIS — J069 Acute upper respiratory infection, unspecified: Secondary | ICD-10-CM | POA: Diagnosis not present

## 2019-05-28 DIAGNOSIS — E1140 Type 2 diabetes mellitus with diabetic neuropathy, unspecified: Secondary | ICD-10-CM | POA: Diagnosis not present

## 2019-05-29 ENCOUNTER — Non-Acute Institutional Stay (SKILLED_NURSING_FACILITY): Payer: Medicare Other | Admitting: Internal Medicine

## 2019-05-29 ENCOUNTER — Encounter: Payer: Self-pay | Admitting: Internal Medicine

## 2019-05-29 DIAGNOSIS — E559 Vitamin D deficiency, unspecified: Secondary | ICD-10-CM | POA: Diagnosis not present

## 2019-05-29 DIAGNOSIS — E785 Hyperlipidemia, unspecified: Secondary | ICD-10-CM | POA: Diagnosis not present

## 2019-05-29 DIAGNOSIS — I5032 Chronic diastolic (congestive) heart failure: Secondary | ICD-10-CM | POA: Diagnosis not present

## 2019-05-29 DIAGNOSIS — D508 Other iron deficiency anemias: Secondary | ICD-10-CM | POA: Diagnosis not present

## 2019-05-29 DIAGNOSIS — E1140 Type 2 diabetes mellitus with diabetic neuropathy, unspecified: Secondary | ICD-10-CM | POA: Diagnosis not present

## 2019-05-29 NOTE — Progress Notes (Signed)
--- This is a routine visit.  Level care is skilled.  Facility is [facility].  Chief complaint routine visit for medical management of chronic medical conditions including dementia-CHF-neuropathy-vitamin D deficiency-anemia-depression-diabetes type 2.  History of present illness.  Patient is an 85 year old female who is a long-term resident of the facility with the above diagnoses.  Nursing does not report any acute issues currently.  She did have a fall in the facility about 2 weeks ago and did go to the ER with-studies including x-rays of the cervical spine and CT of the head were negative for any acute process-she did return the facility and appears to Margaret [name] at her baseline.  She does have a history of CHF she continues on Lasix 40 mg a day with potassium supplementation-her weight appears to Margaret [name] stable she has minimal edema this appears to Margaret [name] compensated.  She does have a history of depression as well Dr. Lyn [name] did start her on low-dose Lexapro and apparently this is helping some she had been making previously some sad statements but apparently this has stabilized.  She does have a history of anemia she is on iron 325 mg a day and this has been stable on labs with hemoglobin around 1112 area we will update this.  She also has a history listed of diabetes type 2 which has been diet controlled hemoglobin A1c has shown stability it was 5.6 back in October.  She also has related neuropathy she is on Neurontin 300 mg nightly.  She did have a recent vitamin D level done which was somewhat low at 20.68 she is on vitamin D 50,000 units weekly we will increase this.  Currently she is lying in bed comfortably she is pleasant and cooperative with exam-communication is somewhat limited since she does not really speak English but she does respond well to visual prompts  Past Medical History:  Diagnosis Date  . Breast mass, right 12/13/2013  . Chronic diastolic CHF (congestive heart  failure) (HCC) 07/19/2013  . Chronic diastolic heart failure (HCC)   . Chronic kidney disease, stage II (mild)   . Chronic renal disease, stage 2, mildly decreased glomerular filtration rate (GFR) between 60-89 mL/min/1.73 square meter 09/03/2014  . Closed T12 fracture (HCC) 07/16/2013  . Depression 07/18/2013  . Diabetes mellitus   . Diabetes mellitus due to underlying condition (HCC) 06/21/2013  . Diabetes mellitus without complication (HCC)   . Dysphagia, oropharyngeal phase   . GERD (gastroesophageal reflux disease) 12/17/2014  . Gout   . Hypertension   . Hypertensive heart disease with congestive heart failure (HCC) 06/21/2013  . Iron deficiency anemia 09/03/2014  . Iron deficiency anemia, unspecified   . Leukocytosis 07/18/2013  . Lumbago 10/23/2013  . Lung nodule < 6cm on CT 11/13/2016  . Osteopenia 07/16/2013  . Syncope 06/21/2013  . Type 2 diabetes, controlled, with neuropathy (HCC) 10/18/2013  . Unspecified constipation 10/18/2013  . Vitamin D deficiency 04/11/2016        Patient Active Problem List   Diagnosis Date Noted  . Real time reverse transcriptase PCR positive for COVID-19 virus 01/16/2019  . Suicidal ideation 12/08/2018  . Hyperlipidemia associated with type 2 diabetes mellitus (HCC) 06/10/2018  . Lung nodule < 6cm on CT 11/13/2016  . Vitamin D deficiency 04/11/2016  . UTI (urinary tract infection) 03/24/2015  . GERD (gastroesophageal reflux disease) 12/17/2014  . Hypokalemia 12/17/2014  . Depression 11/25/2014  . Iron deficiency anemia 09/03/2014  . Breast mass, right 12/13/2013  . Lumbago 10/23/2013  .  Type 2 diabetes, controlled, with neuropathy (HCC) 10/18/2013  . 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 Diabetes mellitus, controlled (HCC) 07/19/2013  . Chronic diastolic CHF (congestive heart failure) (HCC) 07/19/2013  . Leukocytosis 06/21/2013  . Hypertensive heart disease with congestive heart failure  (HCC) 06/21/2013  . Diabetes mellitus due to underlying condition (HCC) 06/21/2013         Past Surgical History:  Procedure Laterality Date  . NO PAST SURGERIES                  OB History   No obstetric history on file.       MEDICATIONS  Medication Sig Start Date End Date Taking? Authorizing Provider  aspirin 81 MG chewable tablet Chew 1 tablet (81 mg total) by mouth daily. 06/24/13  Yes Hollice Espy, MD  bisacodyl (DULCOLAX) 10 MG suppository Place 10 mg rectally as needed for moderate constipation.   Yes [provider]  Cholecalciferol (VITAMIN D3) 1.25 MG (50000 UT) CAPS Take 1 capsule by mouth every Tuesday.    Yes [provider]  escitalopram (LEXAPRO) 10 MG tablet Take 10 mg by mouth daily.   Yes [provider]  ferrous sulfate 325 (65 FE) MG tablet Take 325 mg by mouth daily with breakfast.    Yes [provider]  furosemide (LASIX) 40 MG tablet Take 40 mg by mouth daily.  04/08/13  Yes [provider]  gabapentin (NEURONTIN) 300 MG capsule Take 1 capsule (300 mg total) by mouth at bedtime. 07/22/13  Yes Tat, Onalee Hua, MD  magnesium hydroxide (MILK OF MAGNESIA) 400 MG/5ML suspension Take 30 mLs by mouth daily as needed for mild constipation.   Yes [provider]  NUTRITIONAL SUPPLEMENT LIQD Take 1 each by mouth every evening. "Magic Cup" - Take one cup with dinner as supplement   Yes [provider]  potassium chloride (K-DUR) 10 MEQ tablet Take 30 mEq by mouth daily.    Yes [provider]  Sodium Phosphates (RA SALINE ENEMA) 19-7 GM/118ML ENEM Place 1 each rectally as needed (for constipation).   Yes [provider]  traMADol (ULTRAM) 50 MG tablet Take 1 tablet (50 mg total) by mouth 2 (two) times daily. 03/26/19  Yes [name], MD    Family History History reviewed. No pertinent family history.  Social History Social History       Tobacco  Use  . Smoking status: Former Games developer  . Smokeless tobacco: Never Used  Substance Use Topics  . Alcohol use: No  . Drug use: No     Allergies              Tequin [gatifloxacin]   Review of systems.  Largely unable to perform secondary to language barriers as well as dementia-nursing does not report any recent acute issues.  She does not appear to Ray[place] having any significant discomfort today lying in bed comfortably pleasant and cooperative.  Physical exam.  Temperature is 97.6 pulse 77 respirations 18 blood pressure 139/78 weight appears to [place] relatively stable at 90 pounds.  In general this is a pleasant elderly female in no distress resting comfortably in bed.  Her skin is warm and dry she does have a covering over her lateral left foot apparently there was an area of slight erythema there that is followed by wound care and treated with Skin-Prep.  Eyes visual acuity appears to Dakisha intact with clear.  Oropharynx limited exam  since she did not open her mouth very wide but from what I could tell mucous membranes appear to Marnae moist.  Chest is clear to auscultation with somewhat shallow air entry there is no labored breathing.  Heart is regular rate and rhythm without murmur gallop or rub she has trace lower extremity edema.  Abdomen soft nontender with positive bowel sounds.  Musculoskeletal is able to move all extremities x4 is a bit limited exam because she is in bed but appears to move her extremities at baseline.-Tends to hold her legs in a somewhat of a contracted position when she is in bed  Neurologic appears grossly intact she is alert cranial nerves appear to Vernisha intact could not really appreciate lateralizing findings.  Psych difficult assessment because of language difficulties but she was pleasant and cooperative and responds well to verbal prompts  Labs.  May 06, 2019.  Vitamin D level 20.68.  March 29, 2019.  Hemoglobin A1c 5.6.  March 01, 2019.   Sodium 143 potassium 4.3 BUN 13.8 creatinine 0.89.  Liver function tests within normal limits.  WBC 4.4 hemoglobin 12.1 platelets 194    November 29, 2018.  WBC 5.5 hemoglobin 11.8 platelets 202   Assessment and plan.  1.  History of CHF this appears to Timea compensated her weight is stable she has minimal edema she is on Lasix 40 mg a day with 30 mEq of potassium labs have shown stability will update a BMP.  2.  History of anemia this appears stable as well on iron 325 mg a day last hemoglobin was 12.1 we will have this updated as well.  3.  History of neuropathy with history of diet-controlled diabetes this appears stable she does receive Neurontin at night 300 mg.  4.  History diabetes type 2 diet-controlled hemoglobin A1c stability it was 5.6 on lab done in October will monitor hemoglobin A1c periodically.  5.  History of vitamin D deficiency vitamin D level is low at just over 20 we will increase her supplementation to 50,000 units twice a week for now and update a level in approximately 8 weeks.  6.  History of depression she was recently started on Lexapro 10 mg a day at this point will monitor apparently this has been fairly stable.  7.  History of hyperlipidemia she is not on any medication LDL was 78 on most recent lab which is satisfactory.  8.  History of chronic back pain she does have orders for tramadol 50 mg twice daily and this apparently is helping.  Again will update a CBC and BMP for updated values will increase her vitamin D twice weekly and recheck it in about 8 weeks.  [id number]

## 2019-05-30 ENCOUNTER — Non-Acute Institutional Stay (SKILLED_NURSING_FACILITY): Payer: Medicare Other | Admitting: Internal Medicine

## 2019-05-30 DIAGNOSIS — F329 Major depressive disorder, single episode, unspecified: Secondary | ICD-10-CM | POA: Diagnosis not present

## 2019-05-30 DIAGNOSIS — F0391 Unspecified dementia with behavioral disturbance: Secondary | ICD-10-CM | POA: Diagnosis not present

## 2019-05-30 DIAGNOSIS — H1032 Unspecified acute conjunctivitis, left eye: Secondary | ICD-10-CM | POA: Diagnosis not present

## 2019-05-30 DIAGNOSIS — E119 Type 2 diabetes mellitus without complications: Secondary | ICD-10-CM | POA: Diagnosis not present

## 2019-05-30 DIAGNOSIS — E559 Vitamin D deficiency, unspecified: Secondary | ICD-10-CM | POA: Diagnosis not present

## 2019-05-30 DIAGNOSIS — F32A Depression, unspecified: Secondary | ICD-10-CM

## 2019-05-30 DIAGNOSIS — D649 Anemia, unspecified: Secondary | ICD-10-CM | POA: Diagnosis not present

## 2019-05-30 LAB — BASIC METABOLIC PANEL
BUN: 20 (ref 4–21)
CO2: 28 — AB (ref 13–22)
Chloride: 101 (ref 99–108)
Creatinine: 0.9 (ref 0.5–1.1)
Glucose: 94
Potassium: 4.2 (ref 3.4–5.3)
Sodium: 140 (ref 137–147)

## 2019-05-30 LAB — CBC AND DIFFERENTIAL
HCT: 34 — AB (ref 36–46)
Hemoglobin: 11.4 — AB (ref 12.0–16.0)
Neutrophils Absolute: 4
Platelets: 255 (ref 150–399)
WBC: 5

## 2019-05-30 LAB — COMPREHENSIVE METABOLIC PANEL
Calcium: 8.4 — AB (ref 8.7–10.7)
GFR calc Af Amer: 69.7
GFR calc non Af Amer: 60.13

## 2019-05-30 LAB — CBC: RBC: 3.74 — AB (ref 3.87–5.11)

## 2019-05-30 NOTE — Progress Notes (Signed)
This is an acute visit.  Level care is skilled.  Facility is [facility].  Chief complaint acute visit secondary to suspected conjunctivitis.  History of present illness.  Patient is an 85 year old female who is a long-term resident of this facility. She has a history of dementia as well as CHF neuropathy vitamin D deficiency anemia depression type 2 diabetes diet-controlled.  She actually was seen just yesterday for routine visit--and appeared to Gaelle at baseline.  However apparently this morning she was noted to have some exudate of her left eye this was apparently somewhat dark exudate.  They also felt that she was a little more agitated this morning-she does have some history of depression and has been started on Lexapro.  Currently she is resting comfortably in bed she is cooperative but is a bit irritated with the exam which was not the case yesterday.  Review of systems is somewhat limited secondary to patient not speaking English but when I ask her she indicates she is not having any discomfort.  Vital signs remained stable  Past Medical History:  Diagnosis Date  . Breast mass, right 12/13/2013  . Chronic diastolic CHF (congestive heart failure) (HCC) 07/19/2013  . Chronic diastolic heart failure (HCC)   . Chronic kidney disease, stage II (mild)   . Chronic renal disease, stage 2, mildly decreased glomerular filtration rate (GFR) between 60-89 mL/min/1.73 square meter 09/03/2014  . Closed T12 fracture (HCC) 07/16/2013  . Depression 07/18/2013  . Diabetes mellitus   . Diabetes mellitus due to underlying condition (HCC) 06/21/2013  . Diabetes mellitus without complication (HCC)   . Dysphagia, oropharyngeal phase   . GERD (gastroesophageal reflux disease) 12/17/2014  . Gout   . Hypertension   . Hypertensive heart disease with congestive heart failure (HCC) 06/21/2013  . Iron deficiency anemia 09/03/2014  . Iron deficiency anemia, unspecified   . Leukocytosis  07/18/2013  . Lumbago 10/23/2013  . Lung nodule < 6cm on CT 11/13/2016  . Osteopenia 07/16/2013  . Syncope 06/21/2013  . Type 2 diabetes, controlled, with neuropathy (HCC) 10/18/2013  . Unspecified constipation 10/18/2013  . Vitamin D deficiency 04/11/2016        Patient Active Problem List   Diagnosis Date Noted  . Real time reverse transcriptase PCR positive for COVID-19 virus 01/16/2019  . Suicidal ideation 12/08/2018  . Hyperlipidemia associated with type 2 diabetes mellitus (HCC) 06/10/2018  . Lung nodule < 6cm on CT 11/13/2016  . Vitamin D deficiency 04/11/2016  . UTI (urinary tract infection) 03/24/2015  . GERD (gastroesophageal reflux disease) 12/17/2014  . Hypokalemia 12/17/2014  . Depression 11/25/2014  . Iron deficiency anemia 09/03/2014  . Breast mass, right 12/13/2013  . Lumbago 10/23/2013  . Type 2 diabetes, controlled, with neuropathy (HCC) 10/18/2013  . 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 Diabetes mellitus, controlled (HCC) 07/19/2013  . Chronic diastolic CHF (congestive heart failure) (HCC) 07/19/2013  . Leukocytosis 06/21/2013  . Hypertensive heart disease with congestive heart failure (HCC) 06/21/2013  . Diabetes mellitus due to underlying condition (HCC) 06/21/2013         Past Surgical History:  Procedure Laterality Date  . NO PAST SURGERIES      OB History  No obstetric history on file.       MEDICATIONS  Medication Sig Start Date End Date Taking? Authorizing Provider  aspirin 81 MG chewable tablet Chew 1 tablet (81 mg total) by mouth daily. 06/24/13  Yes Hollice EspyKrishnan, Sendil K,  MD  bisacodyl (DULCOLAX) 10 MG suppository Place 10 mg rectally as needed for moderate constipation.   Yes [provider]  Cholecalciferol (VITAMIN D3) 1.25 MG (50000 UT) CAPS Take 1 capsule by mouth every Tuesday.    Yes [provider]  escitalopram (LEXAPRO) 10 MG tablet  Take 10 mg by mouth daily.   Yes [provider]  ferrous sulfate 325 (65 FE) MG tablet Take 325 mg by mouth daily with breakfast.    Yes [provider]  furosemide (LASIX) 40 MG tablet Take 40 mg by mouth daily.  04/08/13  Yes [provider]  gabapentin (NEURONTIN) 300 MG capsule Take 1 capsule (300 mg total) by mouth at bedtime. 07/22/13  Yes Tat, Onalee Hua, MD  magnesium hydroxide (MILK OF MAGNESIA) 400 MG/5ML suspension Take 30 mLs by mouth daily as needed for mild constipation.   Yes [provider]  NUTRITIONAL SUPPLEMENT LIQD Take 1 each by mouth every evening. "Magic Cup" - Take one cup with dinner as supplement   Yes [provider]  potassium chloride (K-DUR) 10 MEQ tablet Take 30 mEq by mouth daily.    Yes [provider]  Sodium Phosphates (RA SALINE ENEMA) 19-7 GM/118ML ENEM Place 1 each rectally as needed (for constipation).   Yes [provider]  traMADol (ULTRAM) 50 MG tablet Take 1 tablet (50 mg total) by mouth 2 (two) times daily. 03/26/19  Yes [name], MD    Family History History reviewed. No pertinent family history.  Social History Social History       Tobacco Use  . Smoking status: Former Games developer  . Smokeless tobacco: Never Used  Substance Use Topics  . Alcohol use: No  . Drug use: No     Allergies  Tequin [gatifloxacin]   Review of systems.  As noted above this is limited because of patient's dementia and language barriers but she indicates she is not having any discomfort.  Physical exam.  Temperature is 97.6 pulse 77 respirations 18 blood pressure 139/78.  General this is a fairly well-nourished elderly female in no distress lying comfortably in bed.  Her skin is warm and dry.  Eyes she appears to have some brownish exudate lateral left eye margins-also some mild erythema left eye and possibly a small bit on the right.  Visual acuity appears  to Shiara intact pupils reactive extraocular movements intact.  Oropharynx is clear mucous membranes moist.  Chest is clear to auscultation with poor respiratory effort there is no labored breathing.  Her being irritated with the exam in general heart is regular rate and rhythm without murmur gallop or rub she does not have significant edema.  Abdomen is soft nontender with active bowel sounds that she does have some reaction to the invasive maneuver but this does not appear to Jennier significant tenderness.  GU could not really appreciate suprapubic tenderness although this is somewhat difficult to fully assess because of some mild irritation with the exam in general.  Musculoskeletal appears able to move all extremities x4 at baseline limited exam since she is in bed.  Neurologic appears grossly intact cannot appreciate any lateralizing findings cranial nerves appear to Ari[place]na intact.  Psych she is somewhat following verbal commands but is a bit irritated with the exam she is not combative.  Labs.  May 06, 2019.  Vitamin D level 20.68.  March 29, 2019.  Hemoglobin A1c 5.6.  March 01, 2019.  Sodium 143 potassium 4.3 BUN 13.8 creatinine 0.89.  Liver function tests within normal limits.  WBC 4.4 hemoglobin 12.1 platelets 194    November 29, 2018.  WBC 5.5 hemoglobin 11.8 platelets 202  Assessment and plan.  1.  Suspected conjunctivitis will order tobramycin eyedrops 2 drops 3 times daily to both eyes for 7 days and monitor if no resolution notify provider  #2 agitation with history of depression-dementia consider getting a UA and culture if this persists-we also have updated labs that are pending we will await those results.  Will monitor with vital signs and pulse ox for now to keep an eye on her.  She does continue on low-dose Lexapro with a history of depression.  Addendum.  We have obtained the updated labs which are reassuring.  Her white count is within  normal range at 5.0 hemoglobin is stable at 11.4 platelets 255,000.  Sodium 140 potassium 4.2 BUN of 19.5 and creatinine of 0.88.  CO2 level is 28.  Again will continue to monitor at this point appears stable-- agitation will need to Annalysse monitored as well as her eye infection  Before I left facility this evening she appeared to Lamekia resting comfortably nursing did not report any further behaviors  [id number]-of note greater than 25 minutes spent assessing patient-discussing her status with nursing staff-reviewing her chart and labs-and reviewing her updated labs which arrived later in the day as well as rechecking her later in the day

## 2019-05-31 ENCOUNTER — Encounter: Payer: Self-pay | Admitting: Internal Medicine

## 2019-05-31 DIAGNOSIS — E119 Type 2 diabetes mellitus without complications: Secondary | ICD-10-CM | POA: Diagnosis not present

## 2019-05-31 DIAGNOSIS — I10 Essential (primary) hypertension: Secondary | ICD-10-CM | POA: Diagnosis not present

## 2019-05-31 DIAGNOSIS — Z79899 Other long term (current) drug therapy: Secondary | ICD-10-CM | POA: Diagnosis not present

## 2019-05-31 DIAGNOSIS — N390 Urinary tract infection, site not specified: Secondary | ICD-10-CM | POA: Diagnosis not present

## 2019-06-18 ENCOUNTER — Other Ambulatory Visit: Payer: Self-pay

## 2019-06-18 ENCOUNTER — Emergency Department (HOSPITAL_COMMUNITY): Payer: Medicare Other

## 2019-06-18 ENCOUNTER — Emergency Department (HOSPITAL_COMMUNITY)
Admission: EM | Admit: 2019-06-18 | Discharge: 2019-06-19 | Disposition: A | Discharge status: Home / Self Care | Payer: Medicare Other | Attending: Emergency Medicine | Admitting: Emergency Medicine

## 2019-06-18 ENCOUNTER — Encounter (HOSPITAL_COMMUNITY): Payer: Self-pay | Admitting: Emergency Medicine

## 2019-06-18 DIAGNOSIS — Z79899 Other long term (current) drug therapy: Secondary | ICD-10-CM | POA: Diagnosis not present

## 2019-06-18 DIAGNOSIS — Y939 Activity, unspecified: Secondary | ICD-10-CM | POA: Insufficient documentation

## 2019-06-18 DIAGNOSIS — W19XXXA Unspecified fall, initial encounter: Secondary | ICD-10-CM | POA: Insufficient documentation

## 2019-06-18 DIAGNOSIS — E1122 Type 2 diabetes mellitus with diabetic chronic kidney disease: Secondary | ICD-10-CM | POA: Insufficient documentation

## 2019-06-18 DIAGNOSIS — Y999 Unspecified external cause status: Secondary | ICD-10-CM | POA: Insufficient documentation

## 2019-06-18 DIAGNOSIS — Z87891 Personal history of nicotine dependence: Secondary | ICD-10-CM | POA: Diagnosis not present

## 2019-06-18 DIAGNOSIS — I5032 Chronic diastolic (congestive) heart failure: Secondary | ICD-10-CM | POA: Diagnosis not present

## 2019-06-18 DIAGNOSIS — S0993XA Unspecified injury of face, initial encounter: Secondary | ICD-10-CM | POA: Diagnosis present

## 2019-06-18 DIAGNOSIS — N182 Chronic kidney disease, stage 2 (mild): Secondary | ICD-10-CM | POA: Diagnosis not present

## 2019-06-18 DIAGNOSIS — I130 Hypertensive heart and chronic kidney disease with heart failure and stage 1 through stage 4 chronic kidney disease, or unspecified chronic kidney disease: Secondary | ICD-10-CM | POA: Diagnosis not present

## 2019-06-18 DIAGNOSIS — J069 Acute upper respiratory infection, unspecified: Secondary | ICD-10-CM | POA: Diagnosis not present

## 2019-06-18 DIAGNOSIS — S12601A Unspecified nondisplaced fracture of seventh cervical vertebra, initial encounter for closed fracture: Secondary | ICD-10-CM

## 2019-06-18 DIAGNOSIS — S0181XA Laceration without foreign body of other part of head, initial encounter: Secondary | ICD-10-CM | POA: Diagnosis not present

## 2019-06-18 DIAGNOSIS — F0390 Unspecified dementia without behavioral disturbance: Secondary | ICD-10-CM | POA: Diagnosis not present

## 2019-06-18 DIAGNOSIS — Z7982 Long term (current) use of aspirin: Secondary | ICD-10-CM | POA: Diagnosis not present

## 2019-06-18 DIAGNOSIS — Y92129 Unspecified place in nursing home as the place of occurrence of the external cause: Secondary | ICD-10-CM | POA: Diagnosis not present

## 2019-06-18 MED ORDER — LIDOCAINE-EPINEPHRINE (PF) 2 %-1:200000 IJ SOLN: 10.0000 mL | Freq: Once | INTRAMUSCULAR | Status: AC

## 2019-06-18 MED ORDER — LIDOCAINE-EPINEPHRINE (PF) 2 %-1:200000 IJ SOLN
INTRAMUSCULAR | Status: AC
  Administered 2019-06-18: 10 mL
  Filled 2019-06-18: qty 20

## 2019-06-18 NOTE — ED Notes (Signed)
Pt transported to CT ?

## 2019-06-18 NOTE — Discharge Instructions (Addendum)
You were seen in the emergency department for evaluation of injuries from a fall.  You had a laceration to your forehead that was sutured closed and the stitches will need to Margaret [name] removed in 5 to 7 days.  Please watch for any signs of infection.  You had a CAT scan of your head and neck which showed a C7 fracture.  Neurosurgery recommended a hard collar at all times and follow-up with them in 1 week.  Please return to the emergency department if any new or concerning symptoms.

## 2019-06-18 NOTE — ED Provider Notes (Signed)
[facility]-EMERGENCY DEPT Provider Note   CSN: [id number] Arrival date & time: 06/18/19  1946     History Chief Complaint  Patient presents with  . Fall    Margaret [name] is a 85 y.o. female.  Level 5 caveat secondary to dementia and language barrier.  She is a resident at a facility and was found by staff after having fallen.  Staff thinks she might of tried to get up out of her wheelchair to go to the bathroom and fell and hit her head.  She has a laceration.  Bleeding controlled.  Per EMS staff states that she is at baseline.  Follows commands with visual cues.  Was seen last month for a fall.  I tried to use the [place] iPad interpreter without success.  The history is provided by the EMS personnel. The history is limited by a language barrier and the condition of the patient. A language interpreter was used.  Fall This is a recurrent problem. The current episode started less than 1 hour ago. Pertinent negatives include no chest pain and no abdominal pain. Nothing aggravates the symptoms. Nothing relieves the symptoms. She has tried nothing for the symptoms. The treatment provided no relief.       Past Medical History:  Diagnosis Date  . Breast mass, right 12/13/2013  . Chronic diastolic CHF (congestive heart failure) (HCC) 07/19/2013  . Chronic diastolic heart failure (HCC)   . Chronic kidney disease, stage II (mild)   . Chronic renal disease, stage 2, mildly decreased glomerular filtration rate (GFR) between 60-89 mL/min/1.73 square meter 09/03/2014  . Closed T12 fracture (HCC) 07/16/2013  . Depression 07/18/2013  . Diabetes mellitus   . Diabetes mellitus due to underlying condition (HCC) 06/21/2013  . Diabetes mellitus without complication (HCC)   . Dysphagia, oropharyngeal phase   . GERD (gastroesophageal reflux disease) 12/17/2014  . Gout   . Hypertension   . Hypertensive heart disease with congestive heart failure (HCC) 06/21/2013  . Iron deficiency  anemia 09/03/2014  . Iron deficiency anemia, unspecified   . Leukocytosis 07/18/2013  . Lumbago 10/23/2013  . Lung nodule < 6cm on CT 11/13/2016  . Osteopenia 07/16/2013  . Syncope 06/21/2013  . Type 2 diabetes, controlled, with neuropathy (HCC) 10/18/2013  . Unspecified constipation 10/18/2013  . Vitamin D deficiency 04/11/2016    Patient Active Problem List   Diagnosis Date Noted  . Real time reverse transcriptase PCR positive for COVID-19 virus 01/16/2019  . Suicidal ideation 12/08/2018  . Hyperlipidemia associated with type 2 diabetes mellitus (HCC) 06/10/2018  . Lung nodule < 6cm on CT 11/13/2016  . Vitamin D deficiency 04/11/2016  . UTI (urinary tract infection) 03/24/2015  . GERD (gastroesophageal reflux disease) 12/17/2014  . Hypokalemia 12/17/2014  . Breast mass, right 12/13/2013  . Lumbago 10/23/2013  . Type 2 diabetes, controlled, with neuropathy (HCC) 10/18/2013  . 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 Diabetes mellitus, controlled (HCC) 07/19/2013  . Chronic diastolic CHF (congestive heart failure) (HCC) 07/19/2013  . Leukocytosis 06/21/2013  . Hypertensive heart disease with congestive heart failure (HCC) 06/21/2013  . Diabetes mellitus due to underlying condition (HCC) 06/21/2013    Past Surgical History:  Procedure Laterality Date  . NO PAST SURGERIES       OB History   No obstetric history on file.     No family history on file.  Social History   Tobacco Use  . Smoking status: Former  Smoker  . Smokeless tobacco: Never Used  Substance Use Topics  . Alcohol use: No  . Drug use: No    Home Medications Prior to Admission medications   Medication Sig Start Date End Date Taking? Authorizing Provider  aspirin 81 MG chewable tablet Chew 1 tablet (81 mg total) by mouth daily. 06/24/13   Hollice Espy, MD  bisacodyl (DULCOLAX) 10 MG suppository Place 10 mg rectally as needed for moderate constipation.     [provider]  Cholecalciferol (VITAMIN D3) 1.25 MG (50000 UT) CAPS Take 1 capsule by mouth every Tuesday.     [provider]  escitalopram (LEXAPRO) 10 MG tablet Take 10 mg by mouth daily.    [provider]  ferrous sulfate 325 (65 FE) MG tablet Take 325 mg by mouth daily with breakfast.     [provider]  furosemide (LASIX) 40 MG tablet Take 40 mg by mouth daily.  04/08/13   [provider]  gabapentin (NEURONTIN) 300 MG capsule Take 1 capsule (300 mg total) by mouth at bedtime. 07/22/13   Catarina Hartshorn, MD  magnesium hydroxide (MILK OF MAGNESIA) 400 MG/5ML suspension Take 30 mLs by mouth daily as needed for mild constipation.    [provider]  NUTRITIONAL SUPPLEMENT LIQD Take 1 each by mouth every evening. "Magic Cup" - Take one cup with dinner as supplement    [provider]  potassium chloride (K-DUR) 10 MEQ tablet Take 30 mEq by mouth daily.     [provider]  Sodium Phosphates (RA SALINE ENEMA) 19-7 GM/118ML ENEM Place 1 each rectally as needed (for constipation).    [provider]  traMADol (ULTRAM) 50 MG tablet Take 1 tablet (50 mg total) by mouth 2 (two) times daily. 03/26/19   [name], MD    Allergies    Tequin [gatifloxacin]  Review of Systems   Review of Systems  Unable to perform ROS: Dementia  Cardiovascular: Negative for chest pain.  Gastrointestinal: Negative for abdominal pain.    Physical Exam Updated Vital Signs BP (!) 162/89 (BP Location: Right Arm)   Pulse 91   Temp 99.3 F (37.4 C) (Oral)   Resp 16   LMP  (LMP Unknown)   SpO2 97%   Physical Exam Vitals and nursing note reviewed.  Constitutional:      General: She is not in acute distress.    Appearance: She is well-developed.  HENT:     Head: Normocephalic.     Comments: Approximately 3 cm V-shaped laceration on upper left forehead.  No crepitus. Eyes:     Conjunctiva/sclera: Conjunctivae normal.    Cardiovascular:     Rate and Rhythm: Normal rate and regular rhythm.     Heart sounds: No murmur.  Pulmonary:     Effort: Pulmonary effort is normal. No respiratory distress.     Breath sounds: Normal breath sounds.  Abdominal:     Palpations: Abdomen is soft.     Tenderness: There is no abdominal tenderness. There is no guarding or rebound.  Musculoskeletal:        General: No tenderness or deformity.     Cervical back: No tenderness.  Skin:    General: Skin is warm and dry.     Capillary Refill: Capillary refill takes less than 2 seconds.  Neurological:     General: No focal deficit present.     Mental Status: She is alert. Mental status is at baseline.     Comments:  Moving upper extremities without difficulty. Lower extremities held in contracted position, attempted to ROM with resistance by patient. No gross deformity     ED Results / Procedures / Treatments   Labs (all labs ordered are listed, but only abnormal results are displayed) Labs Reviewed - No data to display  EKG None  Radiology CT Head Wo Contrast  Result Date: 06/18/2019 CLINICAL DATA:  Poly trauma, head and C-spine fracture suspected EXAM: CT HEAD WITHOUT CONTRAST CT CERVICAL SPINE WITHOUT CONTRAST TECHNIQUE: Multidetector CT imaging of the head and cervical spine was performed following the standard protocol without intravenous contrast. Multiplanar CT image reconstructions of the cervical spine were also generated. COMPARISON:  05/12/2019 FINDINGS: CT HEAD FINDINGS Brain: No evidence of acute infarction, hemorrhage, hydrocephalus, extra-axial collection or mass lesion/mass effect. Marked cerebral atrophy and chronic microvascular ischemic changes with areas of bilateral prior basal ganglia infarcts. Vascular: No hyperdense vessel or unexpected calcification. Skull: Normal. Negative for fracture or focal lesion. Signs of prior mastoid surgery. Chronic middle ear and mastoid opacification on the left.  Sinuses/Orbits: Visualized paranasal sinuses and orbits are unremarkable. Other: Small amount of gas in the soft tissues along the left frontal region likely represents the reported site of laceration. CT CERVICAL SPINE FINDINGS Alignment: Mild anterolisthesis of C6 on C7. See below for fractures, associated with mildly abnormal spinal alignment. Skull base and vertebrae: No signs of acute fracture of the skull base. Right C7 inferior facet fracture extending into the lateral mass and lamina. Small bone fragment along the anterior superior aspect of C7 unchanged with approximately 3 mm of anterolisthesis of C6 on C7. Facets maintain their normal relationships. Anterolisthesis is more pronounced perhaps slightly than on the prior study or was present previously. No additional fracture noted in the cervical spine. Soft tissues and spinal canal: No prevertebral fluid or swelling. No visible canal hematoma. Disc levels: Multilevel degenerative changes extending throughout the cervical spine. Upper chest: Negative. Other: None IMPRESSION: 1. No acute intracranial abnormality. 2. Marked cerebral atrophy and chronic microvascular ischemic changes with areas of prior bilateral basal ganglia infarcts. 3. Right C7 inferior facet fracture extending obliquely into the lateral mass and lamina. This associated with perhaps slightly more anterolisthesis of C6 on C7 may indicate some underlying ligamentous injury, consider MRI of the cervical spine for further assessment. 4. No signs of acute skull base fracture. Small amount of gas in the soft tissues along the left frontal region likely represents the reported site of laceration. 5. Multilevel degenerative changes of the cervical spine. 6. Chronic left middle ear and mastoid opacification with signs of previous surgery involving right mastoids. Electronically Signed   By: Donzetta Kohut M.D.   On: 06/18/2019 21:34   CT Cervical Spine Wo Contrast  Result Date:  06/18/2019 CLINICAL DATA:  Poly trauma, head and C-spine fracture suspected EXAM: CT HEAD WITHOUT CONTRAST CT CERVICAL SPINE WITHOUT CONTRAST TECHNIQUE: Multidetector CT imaging of the head and cervical spine was performed following the standard protocol without intravenous contrast. Multiplanar CT image reconstructions of the cervical spine were also generated. COMPARISON:  05/12/2019 FINDINGS: CT HEAD FINDINGS Brain: No evidence of acute infarction, hemorrhage, hydrocephalus, extra-axial collection or mass lesion/mass effect. Marked cerebral atrophy and chronic microvascular ischemic changes with areas of bilateral prior basal ganglia infarcts. Vascular: No hyperdense vessel or unexpected calcification. Skull: Normal. Negative for fracture or focal lesion. Signs of prior mastoid surgery. Chronic middle ear and mastoid opacification on the left. Sinuses/Orbits: Visualized paranasal sinuses and orbits are unremarkable.  Other: Small amount of gas in the soft tissues along the left frontal region likely represents the reported site of laceration. CT CERVICAL SPINE FINDINGS Alignment: Mild anterolisthesis of C6 on C7. See below for fractures, associated with mildly abnormal spinal alignment. Skull base and vertebrae: No signs of acute fracture of the skull base. Right C7 inferior facet fracture extending into the lateral mass and lamina. Small bone fragment along the anterior superior aspect of C7 unchanged with approximately 3 mm of anterolisthesis of C6 on C7. Facets maintain their normal relationships. Anterolisthesis is more pronounced perhaps slightly than on the prior study or was present previously. No additional fracture noted in the cervical spine. Soft tissues and spinal canal: No prevertebral fluid or swelling. No visible canal hematoma. Disc levels: Multilevel degenerative changes extending throughout the cervical spine. Upper chest: Negative. Other: None IMPRESSION: 1. No acute intracranial abnormality.  2. Marked cerebral atrophy and chronic microvascular ischemic changes with areas of prior bilateral basal ganglia infarcts. 3. Right C7 inferior facet fracture extending obliquely into the lateral mass and lamina. This associated with perhaps slightly more anterolisthesis of C6 on C7 may indicate some underlying ligamentous injury, consider MRI of the cervical spine for further assessment. 4. No signs of acute skull base fracture. Small amount of gas in the soft tissues along the left frontal region likely represents the reported site of laceration. 5. Multilevel degenerative changes of the cervical spine. 6. Chronic left middle ear and mastoid opacification with signs of previous surgery involving right mastoids. Electronically Signed   By: Zetta Bills M.D.   On: 06/18/2019 21:34    Procedures .Marland KitchenLaceration Repair  Date/Time: 06/18/2019 8:40 PM Performed by: Hayden Rasmussen, MD Authorized by: Hayden Rasmussen, MD   Consent:    Consent obtained:  Emergent situation   Alternatives discussed:  No treatment and delayed treatment Anesthesia (see MAR for exact dosages):    Anesthesia method:  Local infiltration   Local anesthetic:  Lidocaine 2% WITH epi Laceration details:    Location:  Face   Face location:  Forehead   Length (cm):  3 Repair type:    Repair type:  Simple Pre-procedure details:    Preparation:  Patient was prepped and draped in usual sterile fashion Exploration:    Contaminated: no   Treatment:    Area cleansed with:  Saline   Amount of cleaning:  Standard   Irrigation solution:  Sterile saline Skin repair:    Repair method:  Sutures   Suture size:  5-0   Suture material:  Nylon   Suture technique:  Simple interrupted   Number of sutures:  3 Approximation:    Approximation:  Close Post-procedure details:    Dressing:  Sterile dressing   Patient tolerance of procedure:  Tolerated well, no immediate complications   (including critical care time)  Medications  Ordered in ED Medications - No data to display  ED Course  I have reviewed the triage vital signs and the nursing notes.  Pertinent labs & imaging results that were available during my care of the patient were reviewed by me and considered in my medical decision making (see chart for details).  Clinical Course as of Jun 18 1209  Tue Jun 18, 2019  2218 Patient's head and cervical CT unfortunately show a C7 lateral mass fracture.  Reviewed this with [place] neurosurgery Mr. Babs Sciara who recommends the patient Annalina in a hard collar and follow-up with neurosurgery in a week.   [MB]  2222  I was able to reach the patient's son and explained the injury and the follow-up plan.   [MB]    Clinical Course User Index [MB] Terrilee FilesButler, Jasier Calabretta C, MD   MDM Rules/Calculators/A&P                       Final Clinical Impression(s) / ED Diagnoses Final diagnoses:  Fall, initial encounter  Facial laceration, initial encounter  Closed nondisplaced fracture of seventh cervical vertebra, unspecified fracture morphology, initial encounter [facility](HCC)    Rx / DC Orders ED Discharge Orders    None       Terrilee FilesButler, Vici Novick C, MD 06/19/19 1212

## 2019-06-18 NOTE — ED Triage Notes (Signed)
Patient came from [facility] by [place]. Patient had a unwitnessed fall from trying to go into bathroom. Patient is at baseline. Patient did not loose consciousness. Patient has a laceration on the top left side.

## 2019-06-18 NOTE — ED Notes (Signed)
Patient diaper changed and cleaned.

## 2019-06-19 ENCOUNTER — Non-Acute Institutional Stay (SKILLED_NURSING_FACILITY): Payer: Medicare Other | Admitting: Internal Medicine

## 2019-06-19 DIAGNOSIS — I10 Essential (primary) hypertension: Secondary | ICD-10-CM | POA: Diagnosis not present

## 2019-06-19 DIAGNOSIS — Z8781 Personal history of (healed) traumatic fracture: Secondary | ICD-10-CM

## 2019-06-19 DIAGNOSIS — R404 Transient alteration of awareness: Secondary | ICD-10-CM | POA: Diagnosis not present

## 2019-06-19 DIAGNOSIS — M2550 Pain in unspecified joint: Secondary | ICD-10-CM | POA: Diagnosis not present

## 2019-06-19 DIAGNOSIS — Z87898 Personal history of other specified conditions: Secondary | ICD-10-CM

## 2019-06-19 DIAGNOSIS — Z7401 Bed confinement status: Secondary | ICD-10-CM | POA: Diagnosis not present

## 2019-06-19 DIAGNOSIS — S0181XA Laceration without foreign body of other part of head, initial encounter: Secondary | ICD-10-CM

## 2019-06-19 NOTE — Progress Notes (Signed)
Location:  [facility]  Nursing Home Room Number: 422-W Place of Service:  SNF [phone]) Provider:  Edmon Crape, PA-C   [name], MD as PCP - General (Internal Medicine)  Extended Emergency Contact Information Primary Emergency Contact: Margaret [name] Address: [address]          [place], [place] [address] Margaret [name] Home Phone: [phone] Mobile Phone: [phone] Relation: Son Secondary Emergency Contact: Margaret [name] Address: [address]          [place], [place] [address] Margaret [name] Home Phone: [phone] Relation: Granddaughter  Code Status:  Full Code Goals of care: Advanced Directive information Advanced Directives 06/18/2019  Does Patient Have a Medical Advance Directive? No  Type of Advance Directive -  Does patient want to make changes to medical advance directive? -  Would patient like information on creating a medical advance directive? No - Patient declined  Pre-existing out of facility DNR order (yellow form or pink MOST form) -     Chief Complaint  Patient presents with  . Hospitalization Follow-up    Patient is seen for hospital followup, status post ED visit for a fall  With diagnosis of a C7 fracture  HPI:  Pt is a 85 y.o. female seen today for an acute visit for follow-up of a fall in facility last night-.  Patient sustained a laceration to her left forehead-work-up in the ER did show a C7 inferior facet fracture.  Neurosurgery was consulted and recommendation was for a hard collar and follow-up by neurosurgery in approximately a week.  Currently patient appears to Courtnee at her baseline-communication is somewhat difficult because she speaks [place] but nursing does not report any acute issues other than she tends to take her hard collar off  Vital signs are stable.  She does have tramadol twice a day for pain she also has orders for Neurontin nightly.  Her other medical issues include  dementia-CHF-vitamin D deficiency anemia depression type 2 diabetes and neuropathy.       Past Medical History:  Diagnosis Date  . Breast mass, right 12/13/2013  . Chronic diastolic CHF (congestive heart failure) (HCC) 07/19/2013  . Chronic diastolic heart failure (HCC)   . Chronic kidney disease, stage II (mild)   . Chronic renal disease, stage 2, mildly decreased glomerular filtration rate (GFR) between 60-89 mL/min/1.73 square meter 09/03/2014  . Closed T12 fracture (HCC) 07/16/2013  . Depression 07/18/2013  . Diabetes mellitus   . Diabetes mellitus due to underlying condition (HCC) 06/21/2013  . Diabetes mellitus without complication (HCC)   . Dysphagia, oropharyngeal phase   . GERD (gastroesophageal reflux disease) 12/17/2014  . Gout   . Hypertension   . Hypertensive heart disease with congestive heart failure (HCC) 06/21/2013  . Iron deficiency anemia 09/03/2014  . Iron deficiency anemia, unspecified   . Leukocytosis 07/18/2013  . Lumbago 10/23/2013  . Lung nodule < 6cm on CT 11/13/2016  . Osteopenia 07/16/2013  . Syncope 06/21/2013  . Type 2 diabetes, controlled, with neuropathy (HCC) 10/18/2013  . Unspecified constipation 10/18/2013  . Vitamin D deficiency 04/11/2016   Past Surgical History:  Procedure Laterality Date  . NO PAST SURGERIES      Allergies  Allergen Reactions  . Tequin [Gatifloxacin] Other (See Comments)    Unknown rxn per [facility]    Outpatient Encounter Medications as of 06/19/2019  Medication Sig  . aspirin 81 MG chewable tablet Chew 1 tablet (81 mg total) by mouth daily.  Marland Kitchen  bisacodyl (DULCOLAX) 10 MG suppository Place 10 mg rectally as needed for moderate constipation.  . Cholecalciferol (VITAMIN D3) 1.25 MG (50000 UT) CAPS Take 1 capsule by mouth every Tuesday.   . escitalopram (LEXAPRO) 10 MG tablet Take 10 mg by mouth daily.  . ferrous sulfate 325 (65 FE) MG tablet Take 325 mg by mouth daily with breakfast.   . furosemide (LASIX) 40 MG tablet Take 40  mg by mouth daily.   Marland Kitchen. gabapentin (NEURONTIN) 300 MG capsule Take 1 capsule (300 mg total) by mouth at bedtime.  . magnesium hydroxide (MILK OF MAGNESIA) 400 MG/5ML suspension Take 30 mLs by mouth daily as needed for mild constipation.  Marland Kitchen. NUTRITIONAL SUPPLEMENT LIQD Take 1 each by mouth every evening. "Magic Cup" - Take one cup with dinner as supplement  . potassium chloride (K-DUR) 10 MEQ tablet Take 30 mEq by mouth daily.   . Sodium Phosphates (RA SALINE ENEMA) 19-7 GM/118ML ENEM Place 1 each rectally as needed (for constipation).  . traMADol (ULTRAM) 50 MG tablet Take 1 tablet (50 mg total) by mouth 2 (two) times daily.   No facility-administered encounter medications on file as of 06/19/2019.    Review of Systems   Is limited secondary to language difficulties-however she does not appear to Khari complaining of significant pain she is resting comfortably in bed-nursing has not reported really any issues-.    Immunization History  Administered Date(s) Administered  . Influenza-Unspecified 04/06/2017, 03/13/2018, 04/12/2019  . PPD Test 08/12/2013  . Pneumococcal Conjugate-13 12/12/2017  . Pneumococcal Polysaccharide-23 06/23/2013  . Tdap 12/11/2017   Pertinent  Health Maintenance Due  Topic Date Due  . HEMOGLOBIN A1C  05/31/2019  . OPHTHALMOLOGY EXAM  06/28/2019 (Originally 04/03/2019)  . DEXA SCAN  06/28/2023 (Originally 06/27/1999)  . FOOT EXAM  06/26/2019  . URINE MICROALBUMIN  12/01/2019  . INFLUENZA VACCINE  Completed  . PNA vac Low Risk Adult  Completed   Fall Risk  01/28/2019 12/11/2017 12/12/2016 12/09/2016  Falls in the past year? (No Data) No Yes Yes  Comment Emmi Telephone Survey: data to providers prior to load - 10/09/16 -  Number falls in past yr: (No Data) - - 1  Comment Emmi Telephone Survey Actual Response =  - - -  Injury with Fall? - - - Yes   Functional Status Survey:    Vitals:   06/19/19 1450  BP: 122/71  Pulse: 89  Resp: 17  Temp: 98 F (36.7 C)   TempSrc: Oral  Weight: 87 lb 9.6 oz (39.7 kg)  Height: 4\' 9"  (1.448 m)   Body mass index is 18.96 kg/m. Physical Exam   In general this is a pleasant elderly female in no distress lying comfortably in bed.  Her skin is warm and dry left forehead does have a laceration with sutures in this appears to Bryssa benign at this point I do not see any sign of infection with drainage bleeding or concerning erythema.  Eyes visual acuity appears to Alfrieda intact sclera and conjunctive are clear pupils appear to Tristian reactive to light extraocular movements are intact.  Oropharynx is clear mucous membranes moist.  Neck-she currently has removed the cervical hard collar-again nursing has been attempting to try to keep this on but has been somewhat difficult with her dementia   Chest is clear to auscultation there is no labored breathing.  Heart is regular rate and rhythm without murmur gallop or rub she does not appear to have significant edema.  Abdomen is  soft nontender with positive bowel sounds.  Musculoskeletal moves all extremities x4 it appears at baseline she is holding her legs in somewhat of a contracted position.  Neurologic appears grossly intact cannot appreciate lateralizing findings cranial nerves appear to Tashona intact.  Psych she is cooperative with exam but appears to Gudelia a little irritated with the exam-she appears to Harlyn at baseline again assessment somewhat difficult because of language barriers.    Heart is regular rate and rhythm without murmur gallop or rub she does not really have significant edema.    Labs reviewed:  May 30, 2019.  WBC 5.6 hemoglobin 11.4 platelets 255.  Sodium 140 potassium 4.2 BUN 19.5 creatinine 0.88  6 Recent Labs    07/09/18 1949 07/21/18 1105  NA 142 143  K 3.6 3.5  CL 102 106  CO2 29 28  GLUCOSE 120* 109*  BUN 30* 17  CREATININE 1.47* 0.95  CALCIUM 8.8* 9.3   Recent Labs    07/09/18 1949 07/21/18 1105  AST 18 20  ALT 11 13   ALKPHOS 63 67  BILITOT 0.5 0.7  PROT 6.9 7.6  ALBUMIN 3.5 4.0   Recent Labs    07/09/18 1949 07/21/18 1105 11/29/18 0000  WBC 4.4 4.2 5.5  NEUTROABS  --   --  4  HGB 11.7* 12.0 11.8*  HCT 36.7 38.2 34*  MCV 93.1 94.6  --   PLT 213 192 202   Lab Results  Component Value Date   TSH 0.24 (A) 11/29/2018   Lab Results  Component Value Date   HGBA1C 5.5 11/29/2018   Lab Results  Component Value Date   CHOL 145 05/01/2018   HDL 48 05/01/2018   LDLCALC 78 05/01/2018   TRIG 96 05/01/2018   CHOLHDL 3.2 06/22/2013    Significant Diagnostic Results in last 30 days:  CT Head Wo Contrast  Result Date: 06/18/2019 CLINICAL DATA:  Poly trauma, head and C-spine fracture suspected EXAM: CT HEAD WITHOUT CONTRAST CT CERVICAL SPINE WITHOUT CONTRAST TECHNIQUE: Multidetector CT imaging of the head and cervical spine was performed following the standard protocol without intravenous contrast. Multiplanar CT image reconstructions of the cervical spine were also generated. COMPARISON:  05/12/2019 FINDINGS: CT HEAD FINDINGS Brain: No evidence of acute infarction, hemorrhage, hydrocephalus, extra-axial collection or mass lesion/mass effect. Marked cerebral atrophy and chronic microvascular ischemic changes with areas of bilateral prior basal ganglia infarcts. Vascular: No hyperdense vessel or unexpected calcification. Skull: Normal. Negative for fracture or focal lesion. Signs of prior mastoid surgery. Chronic middle ear and mastoid opacification on the left. Sinuses/Orbits: Visualized paranasal sinuses and orbits are unremarkable. Other: Small amount of gas in the soft tissues along the left frontal region likely represents the reported site of laceration. CT CERVICAL SPINE FINDINGS Alignment: Mild anterolisthesis of C6 on C7. See below for fractures, associated with mildly abnormal spinal alignment. Skull base and vertebrae: No signs of acute fracture of the skull base. Right C7 inferior facet fracture  extending into the lateral mass and lamina. Small bone fragment along the anterior superior aspect of C7 unchanged with approximately 3 mm of anterolisthesis of C6 on C7. Facets maintain their normal relationships. Anterolisthesis is more pronounced perhaps slightly than on the prior study or was present previously. No additional fracture noted in the cervical spine. Soft tissues and spinal canal: No prevertebral fluid or swelling. No visible canal hematoma. Disc levels: Multilevel degenerative changes extending throughout the cervical spine. Upper chest: Negative. Other: None IMPRESSION: 1. No acute intracranial  abnormality. 2. Marked cerebral atrophy and chronic microvascular ischemic changes with areas of prior bilateral basal ganglia infarcts. 3. Right C7 inferior facet fracture extending obliquely into the lateral mass and lamina. This associated with perhaps slightly more anterolisthesis of C6 on C7 may indicate some underlying ligamentous injury, consider MRI of the cervical spine for further assessment. 4. No signs of acute skull base fracture. Small amount of gas in the soft tissues along the left frontal region likely represents the reported site of laceration. 5. Multilevel degenerative changes of the cervical spine. 6. Chronic left middle ear and mastoid opacification with signs of previous surgery involving right mastoids. Electronically Signed   By: Donzetta Kohut M.D.   On: 06/18/2019 21:34   CT Cervical Spine Wo Contrast  Result Date: 06/18/2019 CLINICAL DATA:  Poly trauma, head and C-spine fracture suspected EXAM: CT HEAD WITHOUT CONTRAST CT CERVICAL SPINE WITHOUT CONTRAST TECHNIQUE: Multidetector CT imaging of the head and cervical spine was performed following the standard protocol without intravenous contrast. Multiplanar CT image reconstructions of the cervical spine were also generated. COMPARISON:  05/12/2019 FINDINGS: CT HEAD FINDINGS Brain: No evidence of acute infarction, hemorrhage,  hydrocephalus, extra-axial collection or mass lesion/mass effect. Marked cerebral atrophy and chronic microvascular ischemic changes with areas of bilateral prior basal ganglia infarcts. Vascular: No hyperdense vessel or unexpected calcification. Skull: Normal. Negative for fracture or focal lesion. Signs of prior mastoid surgery. Chronic middle ear and mastoid opacification on the left. Sinuses/Orbits: Visualized paranasal sinuses and orbits are unremarkable. Other: Small amount of gas in the soft tissues along the left frontal region likely represents the reported site of laceration. CT CERVICAL SPINE FINDINGS Alignment: Mild anterolisthesis of C6 on C7. See below for fractures, associated with mildly abnormal spinal alignment. Skull base and vertebrae: No signs of acute fracture of the skull base. Right C7 inferior facet fracture extending into the lateral mass and lamina. Small bone fragment along the anterior superior aspect of C7 unchanged with approximately 3 mm of anterolisthesis of C6 on C7. Facets maintain their normal relationships. Anterolisthesis is more pronounced perhaps slightly than on the prior study or was present previously. No additional fracture noted in the cervical spine. Soft tissues and spinal canal: No prevertebral fluid or swelling. No visible canal hematoma. Disc levels: Multilevel degenerative changes extending throughout the cervical spine. Upper chest: Negative. Other: None IMPRESSION: 1. No acute intracranial abnormality. 2. Marked cerebral atrophy and chronic microvascular ischemic changes with areas of prior bilateral basal ganglia infarcts. 3. Right C7 inferior facet fracture extending obliquely into the lateral mass and lamina. This associated with perhaps slightly more anterolisthesis of C6 on C7 may indicate some underlying ligamentous injury, consider MRI of the cervical spine for further assessment. 4. No signs of acute skull base fracture. Small amount of gas in the soft  tissues along the left frontal region likely represents the reported site of laceration. 5. Multilevel degenerative changes of the cervical spine. 6. Chronic left middle ear and mastoid opacification with signs of previous surgery involving right mastoids. Electronically Signed   By: Donzetta Kohut M.D.   On: 06/18/2019 21:34    Assessment/Plan   #1 history of fall with C7 inferior facet fracture-follow-up with neurosurgery in approximately a week-recommendation for a hard collar-nursing has been attempting to keep this on but has been quite challenging with patient often removing it--nursing will Regina monitoring her closely trying to prevent falls-  At this point appears to Joscelynn at her baseline does not appear to  Allis having significant pain-continue to monitor closely.  2.  History of left forehead laceration-this was sutured-at this point appears stable I suspect sutures will need to Kioni removed in approximately a week and this will need to Sharice monitored I do not see any sign of infection currently.  [id number] note greater than 25 minutes spent assessing patient-reviewing her chart and labs-discussing her status with nursing staff-coordinating a plan of care.      Edmon Crape, PA-C [phone]

## 2019-06-19 NOTE — ED Notes (Signed)
PTAR Picking up patient to take back to [facility].

## 2019-06-22 ENCOUNTER — Encounter: Payer: Self-pay | Admitting: Internal Medicine

## 2019-06-23 DIAGNOSIS — J069 Acute upper respiratory infection, unspecified: Secondary | ICD-10-CM | POA: Diagnosis not present

## 2019-06-27 DIAGNOSIS — Z23 Encounter for immunization: Secondary | ICD-10-CM | POA: Diagnosis not present

## 2019-07-01 DIAGNOSIS — J069 Acute upper respiratory infection, unspecified: Secondary | ICD-10-CM | POA: Diagnosis not present

## 2019-07-03 DIAGNOSIS — S12601K Unspecified nondisplaced fracture of seventh cervical vertebra, subsequent encounter for fracture with nonunion: Secondary | ICD-10-CM | POA: Diagnosis not present

## 2019-07-03 DIAGNOSIS — S129XXA Fracture of neck, unspecified, initial encounter: Secondary | ICD-10-CM | POA: Diagnosis not present

## 2019-07-08 ENCOUNTER — Non-Acute Institutional Stay (SKILLED_NURSING_FACILITY): Payer: Medicare Other | Admitting: Internal Medicine

## 2019-07-08 ENCOUNTER — Encounter: Payer: Self-pay | Admitting: Internal Medicine

## 2019-07-08 DIAGNOSIS — K219 Gastro-esophageal reflux disease without esophagitis: Secondary | ICD-10-CM | POA: Diagnosis not present

## 2019-07-08 DIAGNOSIS — I5032 Chronic diastolic (congestive) heart failure: Secondary | ICD-10-CM

## 2019-07-08 DIAGNOSIS — I110 Hypertensive heart disease with heart failure: Secondary | ICD-10-CM | POA: Diagnosis not present

## 2019-07-08 NOTE — Progress Notes (Signed)
Location:  [facility] Room Number: 203-W Place of Service:  SNF (31)  [name], MD  [name], MD as PCP - General (Internal Medicine)  Extended Emergency Contact Information Primary Emergency Contact: [name],Hung Address: [address]          [place], [place] [address] [name] of [place] Home Phone: [phone] Mobile Phone: [phone] Relation: Son Secondary Emergency Contact: [name],[name] Address: [address]          [place], [place] [address] [name] of [place] Home Phone: [phone] Relation: Granddaughter    Allergies: Tequin [gatifloxacin]  Chief Complaint  Patient presents with  . Medical Management of Chronic Issues    HPI: Patient is an 86 y.o. female who is being seen for routine issues of hypertension, GERD, and diabetes mellitus type 2.  Past Medical History:  Diagnosis Date  . Breast mass, right 12/13/2013  . Chronic diastolic CHF (congestive heart failure) (HCC) 07/19/2013  . Chronic diastolic heart failure (HCC)   . Chronic kidney disease, stage II (mild)   . Chronic renal disease, stage 2, mildly decreased glomerular filtration rate (GFR) between 60-89 mL/min/1.73 square meter 09/03/2014  . Closed T12 fracture (HCC) 07/16/2013  . Depression 07/18/2013  . Diabetes mellitus   . Diabetes mellitus due to underlying condition (HCC) 06/21/2013  . Diabetes mellitus without complication (HCC)   . Dysphagia, oropharyngeal phase   . GERD (gastroesophageal reflux disease) 12/17/2014  . Gout   . Hypertension   . Hypertensive heart disease with congestive heart failure (HCC) 06/21/2013  . Iron deficiency anemia 09/03/2014  . Iron deficiency anemia, unspecified   . Leukocytosis 07/18/2013  . Lumbago 10/23/2013  . Lung nodule < 6cm on CT 11/13/2016  . Osteopenia 07/16/2013  . Syncope 06/21/2013  . Type 2 diabetes, controlled, with neuropathy (HCC) 10/18/2013  . Unspecified  constipation 10/18/2013  . Vitamin D deficiency 04/11/2016    Past Surgical History:  Procedure Laterality Date  . NO PAST SURGERIES      Allergies as of 07/08/2019      Reactions   Tequin [gatifloxacin] Other (See Comments)   Unknown rxn per [facility]      Medication List       Accurate as of July 08, 2019 11:59 PM. If you have any questions, ask your nurse or doctor.        aspirin 81 MG chewable tablet Chew 1 tablet (81 mg total) by mouth daily.   bisacodyl 10 MG suppository Commonly known as: DULCOLAX Place 10 mg rectally as needed for moderate constipation.   escitalopram 10 MG tablet Commonly known as: LEXAPRO Take 10 mg by mouth daily.   ferrous sulfate 325 (65 FE) MG tablet Take 325 mg by mouth daily with breakfast.   furosemide 40 MG tablet Commonly known as: LASIX Take 40 mg by mouth daily.   gabapentin 300 MG capsule Commonly known as: NEURONTIN Take 1 capsule (300 mg total) by mouth at bedtime.   magnesium hydroxide 400 MG/5ML suspension Commonly known as: MILK OF MAGNESIA Take 30 mLs by mouth daily as needed for mild constipation.   Nutritional Supplement Liqd Take 1 each by mouth every evening. "Magic Cup" - Take one cup with dinner as supplement   potassium chloride 10 MEQ tablet Commonly known as: KLOR-CON Take 30 mEq by mouth daily.   RA Saline Enema 19-7 GM/118ML Enem Place 1 each rectally as needed (for constipation).   traMADol 50 MG  tablet Commonly known as: ULTRAM Take 1 tablet (50 mg total) by mouth 2 (two) times daily.   Vitamin D3 1.25 MG (50000 UT) Caps Take 1 capsule by mouth See admin instructions. Twice weekly       No orders of the defined types were placed in this encounter.   Immunization History  Administered Date(s) Administered  . Influenza-Unspecified 04/06/2017, 03/13/2018, 04/12/2019  . Moderna SARS-COVID-2 Vaccination 06/27/2019  . PPD Test 08/12/2013  . Pneumococcal Conjugate-13 12/12/2017  . Pneumococcal  Polysaccharide-23 06/23/2013  . Tdap 12/11/2017, 11/28/2018    Social History   Tobacco Use  . Smoking status: Former Games developer  . Smokeless tobacco: Never Used  Substance Use Topics  . Alcohol use: No    Review of Systems  Patient is limited but can make her needs known GENERAL:  no fevers, fatigue, appetite changes SKIN: No itching, rash HEENT: No complaint RESPIRATORY: No cough, wheezing, SOB CARDIAC: No chest pain, palpitations, lower extremity edema  GI: No abdominal pain, No N/V/D or constipation, No heartburn or reflux  GU: No dysuria, frequency or urgency, or incontinence  MUSCULOSKELETAL: No unrelieved bone/joint pain NEUROLOGIC: No headache, dizziness  PSYCHIATRIC: No overt anxiety or sadness  Vitals:   07/08/19 1013  BP: 118/81  Pulse: 98  Resp: 18  Temp: 99.3 F (37.4 C)   Body mass index is 18.96 kg/m. Physical Exam  GENERAL APPEARANCE: Alert, conversant, No acute distress  SKIN: No diaphoresis rash HEENT: Unremarkable RESPIRATORY: Breathing is even, unlabored. Lung sounds are clear   CARDIOVASCULAR: Heart RRR no murmurs, rubs or gallops. No peripheral edema  GASTROINTESTINAL: Abdomen is soft, non-tender, not distended w/ normal bowel sounds.  GENITOURINARY: Bladder non tender, not distended  MUSCULOSKELETAL: Contractures bilateral knees; is working with physical therapy NEUROLOGIC: Cranial nerves 2-12 grossly intact. Moves all extremities PSYCHIATRIC: Mood and affect appropriate with dementia, sometimes behavioral issues  Patient Active Problem List   Diagnosis Date Noted  . Real time reverse transcriptase PCR positive for COVID-19 virus 01/16/2019  . Suicidal ideation 12/08/2018  . Hyperlipidemia associated with type 2 diabetes mellitus (HCC) 06/10/2018  . Lung nodule < 6cm on CT 11/13/2016  . Vitamin D deficiency 04/11/2016  . UTI (urinary tract infection) 03/24/2015  . GERD (gastroesophageal reflux disease) 12/17/2014  . Hypokalemia 12/17/2014    . Breast mass, right 12/13/2013  . Lumbago 10/23/2013  . Type 2 diabetes, controlled, with neuropathy (HCC) 10/18/2013  . 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 Diabetes mellitus, controlled (HCC) 07/19/2013  . Chronic diastolic CHF (congestive heart failure) (HCC) 07/19/2013  . Leukocytosis 06/21/2013  . Hypertensive heart disease with congestive heart failure (HCC) 06/21/2013  . Diabetes mellitus due to underlying condition (HCC) 06/21/2013    CMP     Component Value Date/Time   NA 140 05/30/2019 0000   K 4.2 05/30/2019 0000   CL 101 05/30/2019 0000   CO2 28 (A) 05/30/2019 0000   GLUCOSE 109 (H) 07/21/2018 1105   BUN 20 05/30/2019 0000   CREATININE 0.9 05/30/2019 0000   CREATININE 0.95 07/21/2018 1105   CALCIUM 8.4 (A) 05/30/2019 0000   PROT 7.6 07/21/2018 1105   ALBUMIN 3.5 03/01/2019 0000   AST 17 03/01/2019 0000   ALT 11 03/01/2019 0000   ALKPHOS 65 03/01/2019 0000   BILITOT 0.7 07/21/2018 1105   GFRNONAA 60.13 05/30/2019 0000   GFRAA 69.70 05/30/2019 0000   Recent Labs    07/21/18 1105 03/01/19 0000 05/30/19 0000  NA 143 143 140  K 3.5 4.3 4.2  CL 106 101 101  CO2 28 30* 28*  GLUCOSE 109*  --   --   BUN 17 14 20   CREATININE 0.95 0.9 0.9  CALCIUM 9.3 9.3 8.4*   Recent Labs    07/21/18 1105 03/01/19 0000  AST 20 17  ALT 13 11  ALKPHOS 67 65  BILITOT 0.7  --   PROT 7.6  --   ALBUMIN 4.0 3.5   Recent Labs    07/21/18 1105 07/21/18 1105 11/29/18 0000 03/01/19 0000 05/30/19 0000  WBC 4.2  --  5.5 4.4 5.0  NEUTROABS  --   --  4  --  4  HGB 12.0   < > 11.8* 12.1 11.4*  HCT 38.2   < > 34* 37 34*  MCV 94.6  --   --   --   --   PLT 192   < > 202 194 255   < > = values in this interval not displayed.   No results for input(s): CHOL, LDLCALC, TRIG in the last 8760 hours.  Invalid input(s): HCL Lab Results  Component Value Date   MICROALBUR 1.2 12/01/2018   Lab Results  Component Value Date   TSH  0.24 (A) 11/29/2018   Lab Results  Component Value Date   HGBA1C 5.5 01/02/2019   Lab Results  Component Value Date   CHOL 145 05/01/2018   HDL 48 05/01/2018   LDLCALC 78 05/01/2018   TRIG 96 05/01/2018   CHOLHDL 3.2 06/22/2013    Significant Diagnostic Results in last 30 days:  CT Head Wo Contrast  Result Date: 06/18/2019 CLINICAL DATA:  Poly trauma, head and C-spine fracture suspected EXAM: CT HEAD WITHOUT CONTRAST CT CERVICAL SPINE WITHOUT CONTRAST TECHNIQUE: Multidetector CT imaging of the head and cervical spine was performed following the standard protocol without intravenous contrast. Multiplanar CT image reconstructions of the cervical spine were also generated. COMPARISON:  05/12/2019 FINDINGS: CT HEAD FINDINGS Brain: No evidence of acute infarction, hemorrhage, hydrocephalus, extra-axial collection or mass lesion/mass effect. Marked cerebral atrophy and chronic microvascular ischemic changes with areas of bilateral prior basal ganglia infarcts. Vascular: No hyperdense vessel or unexpected calcification. Skull: Normal. Negative for fracture or focal lesion. Signs of prior mastoid surgery. Chronic middle ear and mastoid opacification on the left. Sinuses/Orbits: Visualized paranasal sinuses and orbits are unremarkable. Other: Small amount of gas in the soft tissues along the left frontal region likely represents the reported site of laceration. CT CERVICAL SPINE FINDINGS Alignment: Mild anterolisthesis of C6 on C7. See below for fractures, associated with mildly abnormal spinal alignment. Skull base and vertebrae: No signs of acute fracture of the skull base. Right C7 inferior facet fracture extending into the lateral mass and lamina. Small bone fragment along the anterior superior aspect of C7 unchanged with approximately 3 mm of anterolisthesis of C6 on C7. Facets maintain their normal relationships. Anterolisthesis is more pronounced perhaps slightly than on the prior study or was  present previously. No additional fracture noted in the cervical spine. Soft tissues and spinal canal: No prevertebral fluid or swelling. No visible canal hematoma. Disc levels: Multilevel degenerative changes extending throughout the cervical spine. Upper chest: Negative. Other: None IMPRESSION: 1. No acute intracranial abnormality. 2. Marked cerebral atrophy and chronic microvascular ischemic changes with areas of prior bilateral basal ganglia infarcts. 3. Right C7 inferior facet fracture extending obliquely into the lateral mass and lamina. This associated with perhaps slightly more anterolisthesis  of C6 on C7 may indicate some underlying ligamentous injury, consider MRI of the cervical spine for further assessment. 4. No signs of acute skull base fracture. Small amount of gas in the soft tissues along the left frontal region likely represents the reported site of laceration. 5. Multilevel degenerative changes of the cervical spine. 6. Chronic left middle ear and mastoid opacification with signs of previous surgery involving right mastoids. Electronically Signed   By: Zetta Bills M.D.   On: 06/18/2019 21:34   CT Cervical Spine Wo Contrast  Result Date: 06/18/2019 CLINICAL DATA:  Poly trauma, head and C-spine fracture suspected EXAM: CT HEAD WITHOUT CONTRAST CT CERVICAL SPINE WITHOUT CONTRAST TECHNIQUE: Multidetector CT imaging of the head and cervical spine was performed following the standard protocol without intravenous contrast. Multiplanar CT image reconstructions of the cervical spine were also generated. COMPARISON:  05/12/2019 FINDINGS: CT HEAD FINDINGS Brain: No evidence of acute infarction, hemorrhage, hydrocephalus, extra-axial collection or mass lesion/mass effect. Marked cerebral atrophy and chronic microvascular ischemic changes with areas of bilateral prior basal ganglia infarcts. Vascular: No hyperdense vessel or unexpected calcification. Skull: Normal. Negative for fracture or focal  lesion. Signs of prior mastoid surgery. Chronic middle ear and mastoid opacification on the left. Sinuses/Orbits: Visualized paranasal sinuses and orbits are unremarkable. Other: Small amount of gas in the soft tissues along the left frontal region likely represents the reported site of laceration. CT CERVICAL SPINE FINDINGS Alignment: Mild anterolisthesis of C6 on C7. See below for fractures, associated with mildly abnormal spinal alignment. Skull base and vertebrae: No signs of acute fracture of the skull base. Right C7 inferior facet fracture extending into the lateral mass and lamina. Small bone fragment along the anterior superior aspect of C7 unchanged with approximately 3 mm of anterolisthesis of C6 on C7. Facets maintain their normal relationships. Anterolisthesis is more pronounced perhaps slightly than on the prior study or was present previously. No additional fracture noted in the cervical spine. Soft tissues and spinal canal: No prevertebral fluid or swelling. No visible canal hematoma. Disc levels: Multilevel degenerative changes extending throughout the cervical spine. Upper chest: Negative. Other: None IMPRESSION: 1. No acute intracranial abnormality. 2. Marked cerebral atrophy and chronic microvascular ischemic changes with areas of prior bilateral basal ganglia infarcts. 3. Right C7 inferior facet fracture extending obliquely into the lateral mass and lamina. This associated with perhaps slightly more anterolisthesis of C6 on C7 may indicate some underlying ligamentous injury, consider MRI of the cervical spine for further assessment. 4. No signs of acute skull base fracture. Small amount of gas in the soft tissues along the left frontal region likely represents the reported site of laceration. 5. Multilevel degenerative changes of the cervical spine. 6. Chronic left middle ear and mastoid opacification with signs of previous surgery involving right mastoids. Electronically Signed   By: Zetta Bills M.D.   On: 06/18/2019 21:34    Assessment and Plan  Hypertensive heart disease with congestive heart failure Stable; continue Lasix 40 mg daily  GERD (gastroesophageal reflux disease) No reported problems; continue Prilosec 40 mg daily  Diabetes mellitus, controlled A1c 5.5 continue Glucophage ER 500 mg daily; patient is not on ACE/ARB or statin due to preference      Hennie [name], MD

## 2019-07-09 ENCOUNTER — Other Ambulatory Visit: Payer: Self-pay | Admitting: Internal Medicine

## 2019-07-09 MED ORDER — TRAMADOL HCL 50 MG PO TABS: 50.0000 mg | ORAL_TABLET | Freq: Two times a day (BID) | ORAL | 0 refills | Status: DC

## 2019-07-10 DIAGNOSIS — R1312 Dysphagia, oropharyngeal phase: Secondary | ICD-10-CM | POA: Diagnosis not present

## 2019-07-10 DIAGNOSIS — M6281 Muscle weakness (generalized): Secondary | ICD-10-CM | POA: Diagnosis not present

## 2019-07-10 DIAGNOSIS — M6249 Contracture of muscle, multiple sites: Secondary | ICD-10-CM | POA: Diagnosis not present

## 2019-07-10 DIAGNOSIS — R488 Other symbolic dysfunctions: Secondary | ICD-10-CM | POA: Diagnosis not present

## 2019-07-10 DIAGNOSIS — M810 Age-related osteoporosis without current pathological fracture: Secondary | ICD-10-CM | POA: Diagnosis not present

## 2019-07-10 DIAGNOSIS — Z9181 History of falling: Secondary | ICD-10-CM | POA: Diagnosis not present

## 2019-07-11 DIAGNOSIS — R1312 Dysphagia, oropharyngeal phase: Secondary | ICD-10-CM | POA: Diagnosis not present

## 2019-07-11 DIAGNOSIS — M6281 Muscle weakness (generalized): Secondary | ICD-10-CM | POA: Diagnosis not present

## 2019-07-11 DIAGNOSIS — R488 Other symbolic dysfunctions: Secondary | ICD-10-CM | POA: Diagnosis not present

## 2019-07-11 DIAGNOSIS — M810 Age-related osteoporosis without current pathological fracture: Secondary | ICD-10-CM | POA: Diagnosis not present

## 2019-07-11 DIAGNOSIS — Z9181 History of falling: Secondary | ICD-10-CM | POA: Diagnosis not present

## 2019-07-11 DIAGNOSIS — M6249 Contracture of muscle, multiple sites: Secondary | ICD-10-CM | POA: Diagnosis not present

## 2019-07-12 DIAGNOSIS — Z9181 History of falling: Secondary | ICD-10-CM | POA: Diagnosis not present

## 2019-07-12 DIAGNOSIS — M810 Age-related osteoporosis without current pathological fracture: Secondary | ICD-10-CM | POA: Diagnosis not present

## 2019-07-12 DIAGNOSIS — M6281 Muscle weakness (generalized): Secondary | ICD-10-CM | POA: Diagnosis not present

## 2019-07-12 DIAGNOSIS — M6249 Contracture of muscle, multiple sites: Secondary | ICD-10-CM | POA: Diagnosis not present

## 2019-07-12 DIAGNOSIS — R1312 Dysphagia, oropharyngeal phase: Secondary | ICD-10-CM | POA: Diagnosis not present

## 2019-07-12 DIAGNOSIS — R488 Other symbolic dysfunctions: Secondary | ICD-10-CM | POA: Diagnosis not present

## 2019-07-15 ENCOUNTER — Encounter: Payer: Self-pay | Admitting: Internal Medicine

## 2019-07-15 DIAGNOSIS — R488 Other symbolic dysfunctions: Secondary | ICD-10-CM | POA: Diagnosis not present

## 2019-07-15 DIAGNOSIS — Z9181 History of falling: Secondary | ICD-10-CM | POA: Diagnosis not present

## 2019-07-15 DIAGNOSIS — M6249 Contracture of muscle, multiple sites: Secondary | ICD-10-CM | POA: Diagnosis not present

## 2019-07-15 DIAGNOSIS — R1312 Dysphagia, oropharyngeal phase: Secondary | ICD-10-CM | POA: Diagnosis not present

## 2019-07-15 DIAGNOSIS — M810 Age-related osteoporosis without current pathological fracture: Secondary | ICD-10-CM | POA: Diagnosis not present

## 2019-07-15 DIAGNOSIS — M6281 Muscle weakness (generalized): Secondary | ICD-10-CM | POA: Diagnosis not present

## 2019-07-16 ENCOUNTER — Encounter: Payer: Self-pay | Admitting: Internal Medicine

## 2019-07-16 DIAGNOSIS — M6249 Contracture of muscle, multiple sites: Secondary | ICD-10-CM | POA: Diagnosis not present

## 2019-07-16 DIAGNOSIS — Z9181 History of falling: Secondary | ICD-10-CM | POA: Diagnosis not present

## 2019-07-16 DIAGNOSIS — R488 Other symbolic dysfunctions: Secondary | ICD-10-CM | POA: Diagnosis not present

## 2019-07-16 DIAGNOSIS — M6281 Muscle weakness (generalized): Secondary | ICD-10-CM | POA: Diagnosis not present

## 2019-07-16 DIAGNOSIS — R1312 Dysphagia, oropharyngeal phase: Secondary | ICD-10-CM | POA: Diagnosis not present

## 2019-07-16 DIAGNOSIS — M810 Age-related osteoporosis without current pathological fracture: Secondary | ICD-10-CM | POA: Diagnosis not present

## 2019-07-16 NOTE — Assessment & Plan Note (Signed)
No reported problems; continue Prilosec 40 mg daily 

## 2019-07-16 NOTE — Assessment & Plan Note (Signed)
A1c 5.5 continue Glucophage ER 500 mg daily; patient is not on ACE/ARB or statin due to preference

## 2019-07-16 NOTE — Assessment & Plan Note (Signed)
Stable; continue Lasix 40 mg daily

## 2019-07-17 DIAGNOSIS — Z9181 History of falling: Secondary | ICD-10-CM | POA: Diagnosis not present

## 2019-07-17 DIAGNOSIS — R488 Other symbolic dysfunctions: Secondary | ICD-10-CM | POA: Diagnosis not present

## 2019-07-17 DIAGNOSIS — M6281 Muscle weakness (generalized): Secondary | ICD-10-CM | POA: Diagnosis not present

## 2019-07-17 DIAGNOSIS — M6249 Contracture of muscle, multiple sites: Secondary | ICD-10-CM | POA: Diagnosis not present

## 2019-07-17 DIAGNOSIS — M810 Age-related osteoporosis without current pathological fracture: Secondary | ICD-10-CM | POA: Diagnosis not present

## 2019-07-17 DIAGNOSIS — R1312 Dysphagia, oropharyngeal phase: Secondary | ICD-10-CM | POA: Diagnosis not present

## 2019-07-18 DIAGNOSIS — R1312 Dysphagia, oropharyngeal phase: Secondary | ICD-10-CM | POA: Diagnosis not present

## 2019-07-18 DIAGNOSIS — M6281 Muscle weakness (generalized): Secondary | ICD-10-CM | POA: Diagnosis not present

## 2019-07-18 DIAGNOSIS — M6249 Contracture of muscle, multiple sites: Secondary | ICD-10-CM | POA: Diagnosis not present

## 2019-07-18 DIAGNOSIS — M810 Age-related osteoporosis without current pathological fracture: Secondary | ICD-10-CM | POA: Diagnosis not present

## 2019-07-18 DIAGNOSIS — R488 Other symbolic dysfunctions: Secondary | ICD-10-CM | POA: Diagnosis not present

## 2019-07-18 DIAGNOSIS — Z9181 History of falling: Secondary | ICD-10-CM | POA: Diagnosis not present

## 2019-07-19 DIAGNOSIS — R1312 Dysphagia, oropharyngeal phase: Secondary | ICD-10-CM | POA: Diagnosis not present

## 2019-07-19 DIAGNOSIS — R488 Other symbolic dysfunctions: Secondary | ICD-10-CM | POA: Diagnosis not present

## 2019-07-19 DIAGNOSIS — Z9181 History of falling: Secondary | ICD-10-CM | POA: Diagnosis not present

## 2019-07-19 DIAGNOSIS — M6281 Muscle weakness (generalized): Secondary | ICD-10-CM | POA: Diagnosis not present

## 2019-07-19 DIAGNOSIS — M810 Age-related osteoporosis without current pathological fracture: Secondary | ICD-10-CM | POA: Diagnosis not present

## 2019-07-19 DIAGNOSIS — M6249 Contracture of muscle, multiple sites: Secondary | ICD-10-CM | POA: Diagnosis not present

## 2019-07-22 DIAGNOSIS — R488 Other symbolic dysfunctions: Secondary | ICD-10-CM | POA: Diagnosis not present

## 2019-07-22 DIAGNOSIS — R1312 Dysphagia, oropharyngeal phase: Secondary | ICD-10-CM | POA: Diagnosis not present

## 2019-07-22 DIAGNOSIS — M6281 Muscle weakness (generalized): Secondary | ICD-10-CM | POA: Diagnosis not present

## 2019-07-22 DIAGNOSIS — J069 Acute upper respiratory infection, unspecified: Secondary | ICD-10-CM | POA: Diagnosis not present

## 2019-07-22 DIAGNOSIS — M810 Age-related osteoporosis without current pathological fracture: Secondary | ICD-10-CM | POA: Diagnosis not present

## 2019-07-22 DIAGNOSIS — M6249 Contracture of muscle, multiple sites: Secondary | ICD-10-CM | POA: Diagnosis not present

## 2019-07-22 DIAGNOSIS — Z9181 History of falling: Secondary | ICD-10-CM | POA: Diagnosis not present

## 2019-07-23 DIAGNOSIS — R488 Other symbolic dysfunctions: Secondary | ICD-10-CM | POA: Diagnosis not present

## 2019-07-23 DIAGNOSIS — R1312 Dysphagia, oropharyngeal phase: Secondary | ICD-10-CM | POA: Diagnosis not present

## 2019-07-23 DIAGNOSIS — M6249 Contracture of muscle, multiple sites: Secondary | ICD-10-CM | POA: Diagnosis not present

## 2019-07-23 DIAGNOSIS — M6281 Muscle weakness (generalized): Secondary | ICD-10-CM | POA: Diagnosis not present

## 2019-07-23 DIAGNOSIS — M810 Age-related osteoporosis without current pathological fracture: Secondary | ICD-10-CM | POA: Diagnosis not present

## 2019-07-23 DIAGNOSIS — Z9181 History of falling: Secondary | ICD-10-CM | POA: Diagnosis not present

## 2019-07-24 DIAGNOSIS — R488 Other symbolic dysfunctions: Secondary | ICD-10-CM | POA: Diagnosis not present

## 2019-07-24 DIAGNOSIS — M6249 Contracture of muscle, multiple sites: Secondary | ICD-10-CM | POA: Diagnosis not present

## 2019-07-24 DIAGNOSIS — R1312 Dysphagia, oropharyngeal phase: Secondary | ICD-10-CM | POA: Diagnosis not present

## 2019-07-24 DIAGNOSIS — M6281 Muscle weakness (generalized): Secondary | ICD-10-CM | POA: Diagnosis not present

## 2019-07-24 DIAGNOSIS — Z9181 History of falling: Secondary | ICD-10-CM | POA: Diagnosis not present

## 2019-07-24 DIAGNOSIS — M810 Age-related osteoporosis without current pathological fracture: Secondary | ICD-10-CM | POA: Diagnosis not present

## 2019-07-25 DIAGNOSIS — Z23 Encounter for immunization: Secondary | ICD-10-CM | POA: Diagnosis not present

## 2019-07-25 DIAGNOSIS — E559 Vitamin D deficiency, unspecified: Secondary | ICD-10-CM | POA: Diagnosis not present

## 2019-07-25 DIAGNOSIS — M6281 Muscle weakness (generalized): Secondary | ICD-10-CM | POA: Diagnosis not present

## 2019-07-25 DIAGNOSIS — Z9181 History of falling: Secondary | ICD-10-CM | POA: Diagnosis not present

## 2019-07-25 DIAGNOSIS — R1312 Dysphagia, oropharyngeal phase: Secondary | ICD-10-CM | POA: Diagnosis not present

## 2019-07-25 DIAGNOSIS — R488 Other symbolic dysfunctions: Secondary | ICD-10-CM | POA: Diagnosis not present

## 2019-07-25 DIAGNOSIS — M810 Age-related osteoporosis without current pathological fracture: Secondary | ICD-10-CM | POA: Diagnosis not present

## 2019-07-25 DIAGNOSIS — M6249 Contracture of muscle, multiple sites: Secondary | ICD-10-CM | POA: Diagnosis not present

## 2019-07-25 LAB — VITAMIN D 25 HYDROXY (VIT D DEFICIENCY, FRACTURES): Vit D, 25-Hydroxy: 23.79

## 2019-07-26 DIAGNOSIS — M6281 Muscle weakness (generalized): Secondary | ICD-10-CM | POA: Diagnosis not present

## 2019-07-26 DIAGNOSIS — R488 Other symbolic dysfunctions: Secondary | ICD-10-CM | POA: Diagnosis not present

## 2019-07-26 DIAGNOSIS — R1312 Dysphagia, oropharyngeal phase: Secondary | ICD-10-CM | POA: Diagnosis not present

## 2019-07-26 DIAGNOSIS — M810 Age-related osteoporosis without current pathological fracture: Secondary | ICD-10-CM | POA: Diagnosis not present

## 2019-07-26 DIAGNOSIS — Z9181 History of falling: Secondary | ICD-10-CM | POA: Diagnosis not present

## 2019-07-26 DIAGNOSIS — M6249 Contracture of muscle, multiple sites: Secondary | ICD-10-CM | POA: Diagnosis not present

## 2019-07-29 DIAGNOSIS — R1312 Dysphagia, oropharyngeal phase: Secondary | ICD-10-CM | POA: Diagnosis not present

## 2019-07-29 DIAGNOSIS — M6249 Contracture of muscle, multiple sites: Secondary | ICD-10-CM | POA: Diagnosis not present

## 2019-07-29 DIAGNOSIS — R488 Other symbolic dysfunctions: Secondary | ICD-10-CM | POA: Diagnosis not present

## 2019-07-29 DIAGNOSIS — M810 Age-related osteoporosis without current pathological fracture: Secondary | ICD-10-CM | POA: Diagnosis not present

## 2019-07-29 DIAGNOSIS — Z9181 History of falling: Secondary | ICD-10-CM | POA: Diagnosis not present

## 2019-07-30 DIAGNOSIS — M810 Age-related osteoporosis without current pathological fracture: Secondary | ICD-10-CM | POA: Diagnosis not present

## 2019-07-30 DIAGNOSIS — M6249 Contracture of muscle, multiple sites: Secondary | ICD-10-CM | POA: Diagnosis not present

## 2019-07-30 DIAGNOSIS — R488 Other symbolic dysfunctions: Secondary | ICD-10-CM | POA: Diagnosis not present

## 2019-07-30 DIAGNOSIS — Z9181 History of falling: Secondary | ICD-10-CM | POA: Diagnosis not present

## 2019-07-30 DIAGNOSIS — R1312 Dysphagia, oropharyngeal phase: Secondary | ICD-10-CM | POA: Diagnosis not present

## 2019-07-31 ENCOUNTER — Encounter: Payer: Self-pay | Admitting: Internal Medicine

## 2019-07-31 ENCOUNTER — Non-Acute Institutional Stay (SKILLED_NURSING_FACILITY): Payer: Medicare Other | Admitting: Internal Medicine

## 2019-07-31 DIAGNOSIS — Z9181 History of falling: Secondary | ICD-10-CM | POA: Diagnosis not present

## 2019-07-31 DIAGNOSIS — R488 Other symbolic dysfunctions: Secondary | ICD-10-CM | POA: Diagnosis not present

## 2019-07-31 DIAGNOSIS — Z8659 Personal history of other mental and behavioral disorders: Secondary | ICD-10-CM | POA: Diagnosis not present

## 2019-07-31 DIAGNOSIS — I10 Essential (primary) hypertension: Secondary | ICD-10-CM

## 2019-07-31 DIAGNOSIS — M810 Age-related osteoporosis without current pathological fracture: Secondary | ICD-10-CM | POA: Diagnosis not present

## 2019-07-31 DIAGNOSIS — D509 Iron deficiency anemia, unspecified: Secondary | ICD-10-CM | POA: Diagnosis not present

## 2019-07-31 DIAGNOSIS — R1312 Dysphagia, oropharyngeal phase: Secondary | ICD-10-CM | POA: Diagnosis not present

## 2019-07-31 DIAGNOSIS — E1140 Type 2 diabetes mellitus with diabetic neuropathy, unspecified: Secondary | ICD-10-CM

## 2019-07-31 DIAGNOSIS — I5032 Chronic diastolic (congestive) heart failure: Secondary | ICD-10-CM | POA: Diagnosis not present

## 2019-07-31 DIAGNOSIS — M6249 Contracture of muscle, multiple sites: Secondary | ICD-10-CM | POA: Diagnosis not present

## 2019-07-31 NOTE — Progress Notes (Signed)
Location:    [facility] Room Number: 203/W Place of Service:  SNF (31) Provider:  Leda Min, MD  Patient Care Team: Hennie [name], MD as PCP - General (Internal Medicine)  Extended Emergency Contact Information Primary Emergency Contact: [name],Hung Address: [address]          [place], [place] [address] Johnnette Litter of [place] Phone: [phone] Mobile Phone: [phone] Relation: Son Secondary Emergency Contact: [name],[name] Address: [address]          [place], [place] [address] Johnnette Litter of [place] Phone: [phone] Relation: Granddaughter  Code Status:  Full Code Goals of care: Advanced Directive information Advanced Directives 07/31/2019  Does Patient Have a Medical Advance Directive? Yes  Type of Advance Directive (No Data)  Does patient want to make changes to medical advance directive? No - Patient declined  Would patient like information on creating a medical advance directive? -  Pre-existing out of facility DNR order (yellow form or pink MOST form) -     Chief Complaint  Patient presents with  . Medical Management of Chronic Issues    Routine visit of medical management   Medical management of chronic medical issues including history of dementia-depression-CHF-neuropathy-anemia-type 2 diabetes-recent C7 cervical fracture-hypertension  HPI:  Pt is a 86 y.o. female seen today for medical management of chronic diseases.  As noted above.  Patient is a long-term resident of facility and appears to Aarti at her baseline today  She does have a history of dementia-and appears to Praise doing fairly well with supportive care her weight is fairly stable.  She is lost a couple pounds in the past 2 months.  She recently did sustain a C7 cervical fracture after a fall with recommendation for a hard collar-staff did try to make sure she adhered  this but it was somewhat difficult with her dementia  and her taking the collar off She also has tramadol twice a day for pain-at this point appears to Luccia stable.  She does not appear to Delberta uncomfortable and neurologically appears to Sherlie at baseline.  She does have a history of CHF she is on Lasix 40 mg a day with potassium 30 mEq a day she does not really show evidence of increased chest congestion shortness of breath or concerning edema or weight gain.  Regards to hypertension this appears stable on Lasix.  She also has history of neuropathy which is chronic and does have Neurontin 300 mg nightly at night.  Currently she is resting in bed comfortably vital signs appear to Erminie stable nursing does not report recent acute issues   Past Medical History:  Diagnosis Date  . Breast mass, right 12/13/2013  . Chronic diastolic CHF (congestive heart failure) ([place]) 07/19/2013  . Chronic diastolic heart failure ([place])   . Chronic kidney disease, stage II (mild)   . Chronic renal disease, stage 2, mildly decreased glomerular filtration rate (GFR) between 60-89 mL/min/1.73 square meter 09/03/2014  . Closed T12 fracture ([place]) 07/16/2013  . Depression 07/18/2013  . Diabetes mellitus   . Diabetes mellitus due to underlying condition ([place]) 06/21/2013  . Diabetes mellitus without complication ([place])   . Dysphagia, oropharyngeal phase   . GERD (gastroesophageal reflux disease) 12/17/2014  . Gout   . Hypertension   . Hypertensive heart disease with congestive heart failure ([place]) 06/21/2013  . Iron deficiency anemia 09/03/2014  . Iron deficiency anemia, unspecified   . Leukocytosis 07/18/2013  .  Lumbago 10/23/2013  . Lung nodule < 6cm on CT 11/13/2016  . Osteopenia 07/16/2013  . Syncope 06/21/2013  . Type 2 diabetes, controlled, with neuropathy (HCC) 10/18/2013  . Unspecified constipation 10/18/2013  . Vitamin D deficiency 04/11/2016   Past Surgical History:  Procedure Laterality Date  . NO PAST SURGERIES      Allergies  Allergen Reactions  . Tequin  [Gatifloxacin] Other (See Comments)    Unknown rxn per [facility]    Allergies as of 07/31/2019      Reactions   Tequin [gatifloxacin] Other (See Comments)   Unknown rxn per [facility]      Medication List       Accurate as of July 31, 2019 11:45 AM. If you have any questions, ask your nurse or doctor.        STOP taking these medications   bisacodyl 10 MG suppository Commonly known as: DULCOLAX Stopped by: Edmon Crape, PA-C   magnesium hydroxide 400 MG/5ML suspension Commonly known as: MILK OF MAGNESIA Stopped by: Edmon Crape, PA-C   RA Saline Enema 19-7 GM/118ML Enem Stopped by: Edmon Crape, PA-C     TAKE these medications   aspirin 81 MG chewable tablet Chew 1 tablet (81 mg total) by mouth daily.   escitalopram 10 MG tablet Commonly known as: LEXAPRO Take 10 mg by mouth daily.   ferrous sulfate 325 (65 FE) MG tablet Take 325 mg by mouth daily with breakfast.   furosemide 40 MG tablet Commonly known as: LASIX Take 40 mg by mouth daily.   gabapentin 300 MG capsule Commonly known as: NEURONTIN Take 1 capsule (300 mg total) by mouth at bedtime.   Nutritional Supplement Liqd Take 1 each by mouth every evening. "Magic Cup" - Take one cup with dinner as supplement   potassium chloride 10 MEQ tablet Commonly known as: KLOR-CON Take 30 mEq by mouth daily.   traMADol 50 MG tablet Commonly known as: ULTRAM Take 1 tablet (50 mg total) by mouth 2 (two) times daily.   Vitamin D3 1.25 MG (50000 UT) Caps Take 1 capsule by mouth See admin instructions. Twice weekly       Review of Systems  Is essentially unobtainable secondary to dementia please see HPI  Immunization History  Administered Date(s) Administered  . Influenza-Unspecified 04/06/2017, 03/13/2018, 04/12/2019  . Moderna SARS-COVID-2 Vaccination 06/27/2019, 07/25/2019  . PPD Test 08/12/2013  . Pneumococcal Conjugate-13 12/12/2017  . Pneumococcal Polysaccharide-23 06/23/2013  . Tdap 12/11/2017, 11/28/2018    Pertinent  Health Maintenance Due  Topic Date Due  . FOOT EXAM  06/26/2019  . HEMOGLOBIN A1C  07/05/2019  . DEXA SCAN  06/28/2023 (Originally 06/27/1999)  . URINE MICROALBUMIN  12/01/2019  . OPHTHALMOLOGY EXAM  06/17/2020  . INFLUENZA VACCINE  Completed  . PNA vac Low Risk Adult  Completed   Fall Risk  01/28/2019 12/11/2017 12/12/2016 12/09/2016  Falls in the past year? (No Data) No Yes Yes  Comment Emmi Telephone Survey: data to providers prior to load - 10/09/16 -  Number falls in past yr: (No Data) - - 1  Comment Emmi Telephone Survey Actual Response =  - - -  Injury with Fall? - - - Yes   Functional Status Survey:    Vitals:   07/31/19 1125  BP: 105/71  Pulse: 70  Resp: 18  Temp: (!) 97 F (36.1 C)  TempSrc: Oral  SpO2: 96%  Weight: 87 lb 1.6 oz (39.5 kg)  Height: 4\' 9"  (1.448 m)   Body mass  index is 18.85 kg/m. Physical Exam In general this is an elderly female in no distress lying comfortably in bed.  Her skin is warm and dry.  Eyes visual acuity appears grossly intact sclera and conjunctive are clear.  Oropharynx appear to [place] clear limited exam since she did not really open her mouth very wide.  Chest is clear to auscultation with somewhat shallow air entry there is no labored breathing.  Heart is regular rate and rhythm without murmur gallop or rub she does not have significant edema.  Abdomen is soft nontender with positive bowel sounds.  Musculoskeletal appears able to move all extremities x4 is holding her lower extremities in a semicontracted position today.  Neurologic appears grossly intact she is alert cannot appreciate lateralizing findings she is not speaking much.  Psych findings consistent with dementia she is  cooperative with exam.   Labs reviewed: Recent Labs    03/01/19 0000 05/30/19 0000  NA 143 140  K 4.3 4.2  CL 101 101  CO2 30* 28*  BUN 14 20  CREATININE 0.9 0.9  CALCIUM 9.3 8.4*   Recent Labs    03/01/19 0000  AST 17   ALT 11  ALKPHOS 65  ALBUMIN 3.5   Recent Labs    11/29/18 0000 03/01/19 0000 05/30/19 0000  WBC 5.5 4.4 5.0  NEUTROABS 4  --  4  HGB 11.8* 12.1 11.4*  HCT 34* 37 34*  PLT 202 194 255   Lab Results  Component Value Date   TSH 0.24 (A) 11/29/2018   Lab Results  Component Value Date   HGBA1C 5.5 01/02/2019   Lab Results  Component Value Date   CHOL 145 05/01/2018   HDL 48 05/01/2018   LDLCALC 78 05/01/2018   TRIG 96 05/01/2018   CHOLHDL 3.2 06/22/2013    Significant Diagnostic Results in last 30 days:  No results found.  Assessment/Plan  #1 history of dementia at this point continue supportive care her weight is relatively stable but will need to Coco watched-nursing does not report any recent behaviors.  2.  History of hypertension this appears stable on Lasix 40 mg a day recent blood pressures 105/71-120/77 135/62 -the lowest I see is 95/56 but per staff is asymptomatic of hypotension At this point will monitor baseline systolics are in the lower 100s.  3.  History of CHF again she is on Lasix with potassium supplementation she does not really show any evidence of edema or chest congestion or shortness of breath.  4.  History of cervical fracture C7-as noted above she did have orders for a hard collar but this was difficult to fully implement because of her dementia and her taking it off-nonetheless she appears to Mackenze stable does not appear to Therisa in any pain or discomfort at this point will monitor.  5.-History of anemia she is on iron hemoglobin has shown stability at 11.4.  6.  History of neuropathy continues on Neurontin 300 mg nightly this is thought to Breiona stable.  7.  History of depression she continues on Lexapro 10 mg a day-.  #8 history of type 2 diabetes this is diet-controlled last hemoglobin A1c was 5.6 will update this.  Of note will obtain a hemoglobin A1c as well as BMP for updated values  [id number]

## 2019-08-01 DIAGNOSIS — Z9181 History of falling: Secondary | ICD-10-CM | POA: Diagnosis not present

## 2019-08-01 DIAGNOSIS — R1312 Dysphagia, oropharyngeal phase: Secondary | ICD-10-CM | POA: Diagnosis not present

## 2019-08-01 DIAGNOSIS — M6249 Contracture of muscle, multiple sites: Secondary | ICD-10-CM | POA: Diagnosis not present

## 2019-08-01 DIAGNOSIS — R488 Other symbolic dysfunctions: Secondary | ICD-10-CM | POA: Diagnosis not present

## 2019-08-01 DIAGNOSIS — M810 Age-related osteoporosis without current pathological fracture: Secondary | ICD-10-CM | POA: Diagnosis not present

## 2019-08-02 DIAGNOSIS — R1312 Dysphagia, oropharyngeal phase: Secondary | ICD-10-CM | POA: Diagnosis not present

## 2019-08-02 DIAGNOSIS — Z9181 History of falling: Secondary | ICD-10-CM | POA: Diagnosis not present

## 2019-08-02 DIAGNOSIS — R488 Other symbolic dysfunctions: Secondary | ICD-10-CM | POA: Diagnosis not present

## 2019-08-02 DIAGNOSIS — E039 Hypothyroidism, unspecified: Secondary | ICD-10-CM | POA: Diagnosis not present

## 2019-08-02 DIAGNOSIS — M6249 Contracture of muscle, multiple sites: Secondary | ICD-10-CM | POA: Diagnosis not present

## 2019-08-02 DIAGNOSIS — D649 Anemia, unspecified: Secondary | ICD-10-CM | POA: Diagnosis not present

## 2019-08-02 DIAGNOSIS — E119 Type 2 diabetes mellitus without complications: Secondary | ICD-10-CM | POA: Diagnosis not present

## 2019-08-02 DIAGNOSIS — I10 Essential (primary) hypertension: Secondary | ICD-10-CM | POA: Diagnosis not present

## 2019-08-02 DIAGNOSIS — M810 Age-related osteoporosis without current pathological fracture: Secondary | ICD-10-CM | POA: Diagnosis not present

## 2019-08-02 LAB — BASIC METABOLIC PANEL
BUN: 20 (ref 4–21)
CO2: 31 — AB (ref 13–22)
Chloride: 96 — AB (ref 99–108)
Creatinine: 1.1 (ref 0.5–1.1)
Glucose: 124
Potassium: 4.9 (ref 3.4–5.3)
Sodium: 139 (ref 137–147)

## 2019-08-02 LAB — HEMOGLOBIN A1C: Hemoglobin A1C: 5.6

## 2019-08-02 LAB — TSH: TSH: 0.53 (ref 0.41–5.90)

## 2019-08-02 LAB — COMPREHENSIVE METABOLIC PANEL
Calcium: 9 (ref 8.7–10.7)
GFR calc Af Amer: 56.23
GFR calc non Af Amer: 48.51

## 2019-08-03 ENCOUNTER — Encounter: Payer: Self-pay | Admitting: Internal Medicine

## 2019-08-05 DIAGNOSIS — R1312 Dysphagia, oropharyngeal phase: Secondary | ICD-10-CM | POA: Diagnosis not present

## 2019-08-05 DIAGNOSIS — Z9181 History of falling: Secondary | ICD-10-CM | POA: Diagnosis not present

## 2019-08-05 DIAGNOSIS — M6249 Contracture of muscle, multiple sites: Secondary | ICD-10-CM | POA: Diagnosis not present

## 2019-08-05 DIAGNOSIS — M810 Age-related osteoporosis without current pathological fracture: Secondary | ICD-10-CM | POA: Diagnosis not present

## 2019-08-05 DIAGNOSIS — R488 Other symbolic dysfunctions: Secondary | ICD-10-CM | POA: Diagnosis not present

## 2019-08-06 DIAGNOSIS — R1312 Dysphagia, oropharyngeal phase: Secondary | ICD-10-CM | POA: Diagnosis not present

## 2019-08-06 DIAGNOSIS — Z9181 History of falling: Secondary | ICD-10-CM | POA: Diagnosis not present

## 2019-08-06 DIAGNOSIS — R488 Other symbolic dysfunctions: Secondary | ICD-10-CM | POA: Diagnosis not present

## 2019-08-06 DIAGNOSIS — M6249 Contracture of muscle, multiple sites: Secondary | ICD-10-CM | POA: Diagnosis not present

## 2019-08-06 DIAGNOSIS — M810 Age-related osteoporosis without current pathological fracture: Secondary | ICD-10-CM | POA: Diagnosis not present

## 2019-08-07 ENCOUNTER — Non-Acute Institutional Stay (SKILLED_NURSING_FACILITY): Payer: Medicare Other | Admitting: Internal Medicine

## 2019-08-07 ENCOUNTER — Encounter: Payer: Self-pay | Admitting: Internal Medicine

## 2019-08-07 DIAGNOSIS — Z79899 Other long term (current) drug therapy: Secondary | ICD-10-CM | POA: Diagnosis not present

## 2019-08-07 DIAGNOSIS — M6249 Contracture of muscle, multiple sites: Secondary | ICD-10-CM | POA: Diagnosis not present

## 2019-08-07 DIAGNOSIS — D509 Iron deficiency anemia, unspecified: Secondary | ICD-10-CM | POA: Diagnosis not present

## 2019-08-07 DIAGNOSIS — Z9181 History of falling: Secondary | ICD-10-CM | POA: Diagnosis not present

## 2019-08-07 DIAGNOSIS — R1312 Dysphagia, oropharyngeal phase: Secondary | ICD-10-CM | POA: Diagnosis not present

## 2019-08-07 DIAGNOSIS — R52 Pain, unspecified: Secondary | ICD-10-CM | POA: Diagnosis not present

## 2019-08-07 DIAGNOSIS — M810 Age-related osteoporosis without current pathological fracture: Secondary | ICD-10-CM | POA: Diagnosis not present

## 2019-08-07 DIAGNOSIS — R488 Other symbolic dysfunctions: Secondary | ICD-10-CM | POA: Diagnosis not present

## 2019-08-07 NOTE — Progress Notes (Signed)
Location:    [facility] Room Number: 203/W Place of Service:  SNF [phone]) Provider:  Juanetta Beets, MD  [name], MD as PCP - General (Internal Medicine)  Extended Emergency Contact Information Primary Emergency Contact: [name],Hung Address: [address]          [place], [place] [address] [name] of [place] Home Phone: [phone] Mobile Phone: [phone] Relation: Son Secondary Emergency Contact: [name],[name] Address: [address]          [place], [place] [address] [name] of [place] Home Phone: [phone] Relation: Granddaughter  Code Status:  Full Code Goals of care: Advanced Directive information Advanced Directives 08/07/2019  Does Patient Have a Medical Advance Directive? Yes  Type of Advance Directive (No Data)  Does patient want to make changes to medical advance directive? No - Patient declined  Would patient like information on creating a medical advance directive? -  Pre-existing out of facility DNR order (yellow form or pink MOST form) -     Chief Complaint  Patient presents with  . Acute Visit    Medication Management    HPI:  Pt is a 86 y.o. female seen today for an acute visit for medication management. Patient is a long-term resident of the facility with a history of anemia as well as dementia depression CHF and neuropathy and a recent C7 cervical fracture as well as hypertension.  Pharmacy has left a recommendation wondering if her pain medications can Fayelynn reduced as well as her iron discontinued secondary to possible stability of her hemoglobin.  She is on iron once a day.  She also is on tramadol 50 mg twice daily for pain this was started apparently after her cervical neck fracture after a fall.  She also is on Neurontin 300 mg nightly.  Currently she appears to Arianah comfortable and nursing does not really port any recent pain complaints.  She did have a  recommendation for a hard collar with cervical neck fracture but this it was difficult to fully applied because of patient's dementia and taking the collar off.  Currently she is resting in bed comfortably she is not talking much but is alert and responsive.  She appears to Mellissa comfortable.  Regards to anemia pharmacy quoted a hemoglobin of over 12 however most recent one has been 11.4 which appears to Pretty more her baseline her baseline has been in the 10-11's range.       Past Medical History:  Diagnosis Date  . Breast mass, right 12/13/2013  . Chronic diastolic CHF (congestive heart failure) (HCC) 07/19/2013  . Chronic diastolic heart failure (HCC)   . Chronic kidney disease, stage II (mild)   . Chronic renal disease, stage 2, mildly decreased glomerular filtration rate (GFR) between 60-89 mL/min/1.73 square meter 09/03/2014  . Closed T12 fracture (HCC) 07/16/2013  . Depression 07/18/2013  . Diabetes mellitus   . Diabetes mellitus due to underlying condition (HCC) 06/21/2013  . Diabetes mellitus without complication (HCC)   . Dysphagia, oropharyngeal phase   . GERD (gastroesophageal reflux disease) 12/17/2014  . Gout   . Hypertension   . Hypertensive heart disease with congestive heart failure (HCC) 06/21/2013  . Iron deficiency anemia 09/03/2014  . Iron deficiency anemia, unspecified   . Leukocytosis 07/18/2013  . Lumbago 10/23/2013  . Lung nodule < 6cm on CT 11/13/2016  . Osteopenia 07/16/2013  . Syncope 06/21/2013  . Type 2 diabetes, controlled, with  neuropathy ([place]) 10/18/2013  . Unspecified constipation 10/18/2013  . Vitamin D deficiency 04/11/2016   Past Surgical History:  Procedure Laterality Date  . NO PAST SURGERIES      Allergies  Allergen Reactions  . Tequin [Gatifloxacin] Other (See Comments)    Unknown rxn per [facility]    Outpatient Encounter Medications as of 08/07/2019  Medication Sig  . aspirin 81 MG chewable tablet Chew 1 tablet (81 mg total) by mouth daily.  .  Cholecalciferol (VITAMIN D3) 1.25 MG (50000 UT) CAPS Take 1 capsule by mouth See admin instructions. Twice weekly  . escitalopram (LEXAPRO) 10 MG tablet Take 10 mg by mouth daily.  . ferrous sulfate 220 (44 Fe) MG/5ML solution :Give 330 mg ( 7.5 ml ) by mouth daily  . furosemide (LASIX) 40 MG tablet Take 40 mg by mouth daily.   Marland Kitchen gabapentin (NEURONTIN) 300 MG capsule Take 1 capsule (300 mg total) by mouth at bedtime.  Marland Kitchen NUTRITIONAL SUPPLEMENT LIQD Take 1 each by mouth every evening. "Magic Cup" - Take one cup with dinner as supplement  . potassium chloride (K-DUR) 10 MEQ tablet Take 30 mEq by mouth daily.   . traMADol (ULTRAM) 50 MG tablet Take 1 tablet (50 mg total) by mouth 2 (two) times daily.  . [DISCONTINUED] ferrous sulfate 325 (65 FE) MG tablet Take 325 mg by mouth daily with breakfast.    No facility-administered encounter medications on file as of 08/07/2019.    Review of Systems   This is been difficult to obtain secondary to patient not speaking English-nursing does not report any recent acute concerns or pain concerns.    Immunization History  Administered Date(s) Administered  . Influenza-Unspecified 04/06/2017, 03/13/2018, 04/12/2019  . Moderna SARS-COVID-2 Vaccination 06/27/2019, 07/25/2019  . PPD Test 08/12/2013  . Pneumococcal Conjugate-13 12/12/2017  . Pneumococcal Polysaccharide-23 06/23/2013  . Tdap 12/11/2017, 11/28/2018   Pertinent  Health Maintenance Due  Topic Date Due  . FOOT EXAM  06/26/2019  . DEXA SCAN  06/28/2023 (Originally 06/27/1999)  . URINE MICROALBUMIN  12/01/2019  . HEMOGLOBIN A1C  01/30/2020  . OPHTHALMOLOGY EXAM  06/17/2020  . INFLUENZA VACCINE  Completed  . PNA vac Low Risk Adult  Completed   Fall Risk  01/28/2019 12/11/2017 12/12/2016 12/09/2016  Falls in the past year? (No Data) No Yes Yes  Comment Emmi Telephone Survey: data to providers prior to load - 10/09/16 -  Number falls in past yr: (No Data) - - 1  Comment Emmi Telephone Survey  Actual Response =  - - -  Injury with Fall? - - - Yes   Functional Status Survey:    Vitals:   08/07/19 1406  BP: 101/71  Pulse: 73  Resp: 18  Temp: (!) 97 F (36.1 C)  TempSrc: Oral  SpO2: 96%  Weight: 87 lb 1.6 oz (39.5 kg)  Height: 4\' 9"  (1.448 m)   Body mass index is 18.85 kg/m. Physical Exam  General this is a somewhat frail elderly female who appears to Margaret [name] at her baseline resting in bed comfortably.  Her skin is warm and dry.  Eyes visual acuity appears to Nakiesha intact sclera and conjunctive are clear.  Oropharynx appeared to Pros[place] clear she did not open her mouth very wide.  Chest was clear to auscultation with somewhat shallow air entry there was no labored breathing.  Heart was regular rate and rhythm without murmur gallop or rub she did not have significant edema.  Abdomen was soft nontender with positive bowel  sounds.  Musculoskeletal moves her upper extremities at baseline is holding her lower extremities in a semicontracted position today at times she will do that.  Neurologic she is alert cannot really appreciate lateralizing findings she does have lower extremity weakness.  And psych findings consistent with progressing dementia she does follow simple verbal commands-verbal communication is somewhat challenging because she does not speak [place].    Labs reviewed: Recent Labs    03/01/19 0000 05/30/19 0000 08/02/19 0000  NA 143 140 139  K 4.3 4.2 4.9  CL 101 101 96*  CO2 30* 28* 31*  BUN 14 20 20   CREATININE 0.9 0.9 1.1  CALCIUM 9.3 8.4* 9.0   Recent Labs    03/01/19 0000  AST 17  ALT 11  ALKPHOS 65  ALBUMIN 3.5   Recent Labs    11/29/18 0000 03/01/19 0000 05/30/19 0000  WBC 5.5 4.4 5.0  NEUTROABS 4  --  4  HGB 11.8* 12.1 11.4*  HCT 34* 37 34*  PLT 202 194 255   Lab Results  Component Value Date   TSH 0.53 08/02/2019   Lab Results  Component Value Date   HGBA1C 5.6 08/02/2019   Lab Results  Component Value Date   CHOL 145  05/01/2018   HDL 48 05/01/2018   LDLCALC 78 05/01/2018   TRIG 96 05/01/2018   CHOLHDL 3.2 06/22/2013    Significant Diagnostic Results in last 30 days:  No results found.  Assessment/Plan #1 pain management-pharmacy is recommended possibly titrating down some of her medications-I discussed this with nursing and at this point will decrease her tramadol down to 25 mg twice daily and monitor.  Also will titrate down her Neurontin to 200 mg nightly for 2 weeks and then down to 100 mg nightly and monitor this as well.  2.  Anemia-as noted above her hemoglobins lately appear to run more in the 11's range although at one point she was slightly above 12-we will check a CBC to see where we stand if hemoglobin is consistently above 12 consider certainly discontinuing the iron.  06/24/2013

## 2019-08-08 DIAGNOSIS — D649 Anemia, unspecified: Secondary | ICD-10-CM | POA: Diagnosis not present

## 2019-08-08 DIAGNOSIS — I10 Essential (primary) hypertension: Secondary | ICD-10-CM | POA: Diagnosis not present

## 2019-08-08 LAB — CBC AND DIFFERENTIAL
HCT: 33 — AB (ref 36–46)
Hemoglobin: 11.2 — AB (ref 12.0–16.0)
Neutrophils Absolute: 4
Platelets: 227 (ref 150–399)
WBC: 5.4

## 2019-08-08 LAB — CBC: RBC: 3.8 — AB (ref 3.87–5.11)

## 2019-08-09 ENCOUNTER — Encounter: Payer: Self-pay | Admitting: Internal Medicine

## 2019-08-09 DIAGNOSIS — R627 Adult failure to thrive: Secondary | ICD-10-CM | POA: Insufficient documentation

## 2019-08-12 DIAGNOSIS — I10 Essential (primary) hypertension: Secondary | ICD-10-CM | POA: Diagnosis not present

## 2019-08-12 DIAGNOSIS — E559 Vitamin D deficiency, unspecified: Secondary | ICD-10-CM | POA: Diagnosis not present

## 2019-08-12 DIAGNOSIS — D649 Anemia, unspecified: Secondary | ICD-10-CM | POA: Diagnosis not present

## 2019-08-12 LAB — CBC AND DIFFERENTIAL
HCT: 36 (ref 36–46)
Hemoglobin: 11.9 — AB (ref 12.0–16.0)
Neutrophils Absolute: 5
Platelets: 199 (ref 150–399)
WBC: 6.8

## 2019-08-12 LAB — CBC: RBC: 4.03 (ref 3.87–5.11)

## 2019-08-13 ENCOUNTER — Encounter: Payer: Self-pay | Admitting: Internal Medicine

## 2019-08-13 DIAGNOSIS — F0390 Unspecified dementia without behavioral disturbance: Secondary | ICD-10-CM | POA: Insufficient documentation

## 2019-08-16 DIAGNOSIS — I10 Essential (primary) hypertension: Secondary | ICD-10-CM | POA: Diagnosis not present

## 2019-08-16 DIAGNOSIS — D649 Anemia, unspecified: Secondary | ICD-10-CM | POA: Diagnosis not present

## 2019-08-16 DIAGNOSIS — E559 Vitamin D deficiency, unspecified: Secondary | ICD-10-CM | POA: Diagnosis not present

## 2019-08-16 LAB — BASIC METABOLIC PANEL
BUN: 45 — AB (ref 4–21)
CO2: 30 — AB (ref 13–22)
Chloride: 103 (ref 99–108)
Creatinine: 1.4 — AB (ref 0.5–1.1)
Glucose: 130
Potassium: 4.1 (ref 3.4–5.3)
Sodium: 147 (ref 137–147)

## 2019-08-16 LAB — CBC: RBC: 4.13 (ref 3.87–5.11)

## 2019-08-16 LAB — CBC AND DIFFERENTIAL
HCT: 37 (ref 36–46)
Hemoglobin: 12.2 (ref 12.0–16.0)
Neutrophils Absolute: 9
Platelets: 219 (ref 150–399)
WBC: 11

## 2019-08-16 LAB — COMPREHENSIVE METABOLIC PANEL
Calcium: 9.7 (ref 8.7–10.7)
GFR calc Af Amer: 41.48
GFR calc non Af Amer: 35.79

## 2019-08-17 ENCOUNTER — Telehealth: Payer: Self-pay | Admitting: Nurse Practitioner

## 2019-08-17 NOTE — Telephone Encounter (Signed)
Called and reported T 101.5, HR 123 bpm, Tylenol 69m q4h x 72hrs, CBC/diff, CMP/eGFR stat, Vs q4hr x 72hrs initiated. CXR and UA C/S ordered  after   CBC/diff showed wbc 11, Neutrohils 85%, Bun/creat 44/1.35. may consider ABT of T is persistently  elevated, may consider ED eval if HR is persistently tachycardia.

## 2019-08-18 DIAGNOSIS — R0989 Other specified symptoms and signs involving the circulatory and respiratory systems: Secondary | ICD-10-CM | POA: Diagnosis not present

## 2019-08-18 DIAGNOSIS — R509 Fever, unspecified: Secondary | ICD-10-CM | POA: Diagnosis not present

## 2019-08-19 ENCOUNTER — Telehealth: Payer: Self-pay | Admitting: Internal Medicine

## 2019-08-19 DIAGNOSIS — I10 Essential (primary) hypertension: Secondary | ICD-10-CM | POA: Diagnosis not present

## 2019-08-19 DIAGNOSIS — Z79899 Other long term (current) drug therapy: Secondary | ICD-10-CM | POA: Diagnosis not present

## 2019-08-19 DIAGNOSIS — E559 Vitamin D deficiency, unspecified: Secondary | ICD-10-CM | POA: Diagnosis not present

## 2019-08-19 DIAGNOSIS — R319 Hematuria, unspecified: Secondary | ICD-10-CM | POA: Diagnosis not present

## 2019-08-19 DIAGNOSIS — N390 Urinary tract infection, site not specified: Secondary | ICD-10-CM | POA: Diagnosis not present

## 2019-08-19 NOTE — Telephone Encounter (Signed)
Pt had temp 101.5 on 2/20 and orderes had been given for labs, CXR and UA c+s.  Same labs had just been done the day before so were canceled.  Tylenol q4h ordered and vs q4 h.  Pt's covid tests have been negative during regular testing.  ED visit was suggested by NP.    Nurse called me on Sun to report that pt is now doing fine.  She's frail and afebrile, HR now normal, alert.  Lungs CTA, voiding well.  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  Ok'd that for that point with continued vitals but did suspect it was b/c of the tylenol.  Interestingly, I was called back about 9 hrs later by the next shift nurse who reported fever and tachycardia having returned so I asked her to go ahead and do the tests originally ordered by NP.  Pt needs to Keylah evaluated for sure today and discussion held with family about whether hospitalization is part of her goals.    Margaret [name], D.O. Geriatrics [facility] [address], [place] [address] Cell Phone (Mon-Fri 8am-5pm):  [phone] On Call:  [phone] & follow prompts after 5pm & weekends Office Phone:  [phone] Office Fax:  [phone]

## 2019-08-20 ENCOUNTER — Encounter: Payer: Self-pay | Admitting: Internal Medicine

## 2019-08-20 ENCOUNTER — Non-Acute Institutional Stay (SKILLED_NURSING_FACILITY): Payer: Medicare Other | Admitting: Internal Medicine

## 2019-08-20 DIAGNOSIS — E870 Hyperosmolality and hypernatremia: Secondary | ICD-10-CM

## 2019-08-20 DIAGNOSIS — R509 Fever, unspecified: Secondary | ICD-10-CM

## 2019-08-20 NOTE — Progress Notes (Signed)
Location:  [facility] Room Number: 203-W Place of Service:  SNF (31)  [name], MD  [name], MD as PCP - General (Internal Medicine)  Extended Emergency Contact Information Primary Emergency Contact: [name],Hung Address: [address]          [place], [place] [address] [name] of [place] Home Phone: [phone] Mobile Phone: [phone] Relation: Son Secondary Emergency Contact: [name],[name] Address: [address]          [place], [place] [address] [name] of [place] Home Phone: [phone] Relation: Granddaughter    Allergies: Tequin [gatifloxacin]  Chief Complaint  Patient presents with  . Acute Visit    Patient is seen for a urinary tract infection.    HPI: Patient is an 86 y.o. female who is being seen for having a fever over the weekend of one 1.3 did not 99.5.  Patient not eating as well and she is already eating very poorly.  Past Medical History:  Diagnosis Date  . Breast mass, right 12/13/2013  . Chronic diastolic CHF (congestive heart failure) (HCC) 07/19/2013  . Chronic diastolic heart failure (HCC)   . Chronic kidney disease, stage II (mild)   . Chronic renal disease, stage 2, mildly decreased glomerular filtration rate (GFR) between 60-89 mL/min/1.73 square meter 09/03/2014  . Closed T12 fracture (HCC) 07/16/2013  . Depression 07/18/2013  . Diabetes mellitus   . Diabetes mellitus due to underlying condition (HCC) 06/21/2013  . Diabetes mellitus without complication (HCC)   . Dysphagia, oropharyngeal phase   . GERD (gastroesophageal reflux disease) 12/17/2014  . Gout   . Hypertension   . Hypertensive heart disease with congestive heart failure (HCC) 06/21/2013  . Iron deficiency anemia 09/03/2014  . Iron deficiency anemia, unspecified   . Leukocytosis 07/18/2013  . Lumbago 10/23/2013  . Lung nodule < 6cm on CT 11/13/2016  . Osteopenia 07/16/2013  . Syncope 06/21/2013  .  Type 2 diabetes, controlled, with neuropathy (HCC) 10/18/2013  . Unspecified constipation 10/18/2013  . Vitamin D deficiency 04/11/2016    Past Surgical History:  Procedure Laterality Date  . NO PAST SURGERIES      Allergies as of 08/20/2019      Reactions   Tequin [gatifloxacin] Other (See Comments)   Unknown rxn per [facility]      Medication List       Accurate as of August 20, 2019  7:57 PM. If you have any questions, ask your nurse or doctor.        STOP taking these medications   aspirin 81 MG chewable tablet Stopped by: Merrilee Seashore, MD   traMADol 50 MG tablet Commonly known as: ULTRAM Stopped by: Merrilee Seashore, MD     TAKE these medications   escitalopram 10 MG tablet Commonly known as: LEXAPRO Take 10 mg by mouth daily.   ferrous sulfate 220 (44 Fe) MG/5ML solution :Give 330 mg ( 7.5 ml ) by mouth daily   furosemide 40 MG tablet Commonly known as: LASIX Take 40 mg by mouth daily.   gabapentin 100 MG capsule Commonly known as: NEURONTIN Take 100 mg by mouth at bedtime. What changed: Another medication with the same name was removed. Continue taking this medication, and follow the directions you see here. Changed by: Merrilee Seashore, MD   Nutritional Supplement Liqd Take 1 each by mouth in the morning, at noon, and at bedtime. Magic Cup   potassium chloride 10 MEQ tablet Commonly known as:  KLOR-CON Take 30 mEq by mouth daily.   Vitamin D3 1.25 MG (50000 UT) Caps Take 1 capsule by mouth See admin instructions. Twice weekly       No orders of the defined types were placed in this encounter.   Immunization History  Administered Date(s) Administered  . Influenza-Unspecified 04/06/2017, 03/13/2018, 04/12/2019  . Moderna SARS-COVID-2 Vaccination 06/27/2019, 07/25/2019  . PPD Test 08/12/2013  . Pneumococcal Conjugate-13 12/12/2017  . Pneumococcal Polysaccharide-23 06/23/2013  . Tdap 12/11/2017, 11/28/2018    Social History   Tobacco Use  .  Smoking status: Former Research scientist (life sciences)  . Smokeless tobacco: Never Used  Substance Use Topics  . Alcohol use: No    Review of Systems   unable to obtain secondary to condition and dementia  ,   Vitals:   08/20/19 1428  BP: 110/60  Pulse: 97  Resp: 18  Temp: 97.8 F (36.6 C)   Body mass index is 18.42 kg/m. Physical Exam  GENERAL APPEARANCE: Somnolent, no acute distress  SKIN: No diaphoresis rash HEENT: Unremarkable RESPIRATORY: Breathing is even, unlabored. Lung sounds are clear with mildly increased respiratory rate CARDIOVASCULAR: Heart regular, slightly increased rate, no murmurs, rubs or gallops. No peripheral edema  GASTROINTESTINAL: Abdomen is soft, non-tender, not distended w/ normal bowel sounds.  GENITOURINARY: Bladder non tender, not distended  MUSCULOSKELETAL: No abnormal joints or musculature NEUROLOGIC: Patient did not move during exam PSYCHIATRIC: Not applicable  Patient Active Problem List   Diagnosis Date Noted  . Dementia without behavioral disturbance ([place]) 08/13/2019  . Failure to thrive in adult 08/09/2019  . Real time reverse transcriptase PCR positive for COVID-19 virus 01/16/2019  . Suicidal ideation 12/08/2018  . Hyperlipidemia associated with type 2 diabetes mellitus ([place]) 06/10/2018  . Lung nodule < 6cm on CT 11/13/2016  . Vitamin D deficiency 04/11/2016  . UTI (urinary tract infection) 03/24/2015  . GERD (gastroesophageal reflux disease) 12/17/2014  . Hypokalemia 12/17/2014  . Breast mass, right 12/13/2013  . Lumbago 10/23/2013  . Type 2 diabetes, controlled, with neuropathy ([place]) 10/18/2013  . Peripheral sensory neuropathy due to type 2 diabetes mellitus ([place]) 10/18/2013  . Diabetes mellitus, controlled ([place]) 07/19/2013  . Chronic diastolic CHF (congestive heart failure) ([place]) 07/19/2013  . Leukocytosis 06/21/2013  . Hypertensive heart disease with congestive heart failure ([place]) 06/21/2013  . Diabetes mellitus due to underlying condition ([place])  06/21/2013    CMP     Component Value Date/Time   NA 147 08/16/2019 0000   K 4.1 08/16/2019 0000   CL 103 08/16/2019 0000   CO2 30 (A) 08/16/2019 0000   GLUCOSE 109 (H) 07/21/2018 1105   BUN 45 (A) 08/16/2019 0000   CREATININE 1.4 (A) 08/16/2019 0000   CREATININE 0.95 07/21/2018 1105   CALCIUM 9.7 08/16/2019 0000   PROT 7.6 07/21/2018 1105   ALBUMIN 3.5 03/01/2019 0000   AST 17 03/01/2019 0000   ALT 11 03/01/2019 0000   ALKPHOS 65 03/01/2019 0000   BILITOT 0.7 07/21/2018 1105   GFRNONAA 35.79 08/16/2019 0000   GFRAA 41.48 08/16/2019 0000   Recent Labs    05/30/19 0000 08/02/19 0000 08/16/19 0000  NA 140 139 147  K 4.2 4.9 4.1  CL 101 96* 103  CO2 28* 31* 30*  BUN 20 20 45*  CREATININE 0.9 1.1 1.4*  CALCIUM 8.4* 9.0 9.7   Recent Labs    03/01/19 0000  AST 17  ALT 11  ALKPHOS 65  ALBUMIN 3.5   Recent Labs    08/08/19  0000 08/12/19 0000 08/16/19 0000  WBC 5.4 6.8 11.0  NEUTROABS 4 5 9   HGB 11.2* 11.9* 12.2  HCT 33* 36 37  PLT 227 199 219   No results for input(s): CHOL, LDLCALC, TRIG in the last 8760 hours.  Invalid input(s): HCL Lab Results  Component Value Date   MICROALBUR 1.2 12/01/2018   Lab Results  Component Value Date   TSH 0.53 08/02/2019   Lab Results  Component Value Date   HGBA1C 5.6 08/02/2019   Lab Results  Component Value Date   CHOL 145 05/01/2018   HDL 48 05/01/2018   LDLCALC 78 05/01/2018   TRIG 96 05/01/2018   CHOLHDL 3.2 06/22/2013    Significant Diagnostic Results in last 30 days:  No results found.  Assessment and Plan  Febrile illness/hypernatremia-patient's lab returned with a sodium of 147, BUN 44.7 with a creatinine of 1.35; UA done is growing out greater than 100,000 lactose remaining gram-negative rods; will have to wait on sensitivities for that; chest x-ray was negative. will start IV fluids to half-normal saline at 75 cc an hour for 3 L and start antibiotic once sensitivities come out tomorrow  No  problem-specific Assessment & Plan notes found for this encounter.   Labs/tests ordered:    06/24/2013, MD

## 2019-08-21 ENCOUNTER — Encounter: Payer: Self-pay | Admitting: Internal Medicine

## 2019-08-21 ENCOUNTER — Non-Acute Institutional Stay (SKILLED_NURSING_FACILITY): Payer: Medicare Other | Admitting: Internal Medicine

## 2019-08-21 DIAGNOSIS — R627 Adult failure to thrive: Secondary | ICD-10-CM | POA: Diagnosis not present

## 2019-08-21 DIAGNOSIS — N10 Acute tubulo-interstitial nephritis: Secondary | ICD-10-CM

## 2019-08-21 NOTE — Progress Notes (Signed)
Location:    [facility] Room Number: 203/W Place of Service:  SNF (31) Provider:  Rae Lips, MD  [name], MD as PCP - General (Internal Medicine)  Extended Emergency Contact Information Primary Emergency Contact: [name],Hung Address: [address]          [place], [place] [address] [name] of [place] Home Phone: [phone] Mobile Phone: [phone] Relation: Son Secondary Emergency Contact: [name],[name] Address: [address]          [place], [place] [address] [name] of [place] Home Phone: [phone] Relation: Granddaughter  Code Status:  Full Code Goals of care: Advanced Directive information Advanced Directives 08/21/2019  Does Patient Have a Medical Advance Directive? Yes  Type of Advance Directive (No Data)  Does patient want to make changes to medical advance directive? No - Patient declined  Would patient like information on creating a medical advance directive? -  Pre-existing out of facility DNR order (yellow form or pink MOST form) -     Chief complaint acute visit secondary to UTI  HPI:  Pt is a 86 y.o. female seen today for  For urine culture which is growing out E. coli ESBL.  Patient has a history of significant progressive dementia and is now largely nonverbal and not really taking p.o. nourishment.  Recent sodium was 147 and she has been started on IV fluids.  The urine culture also has returned showing ESBL E. coli greater than 100,000 colonies and also a second organism 50,000 colonies with cultures pending.  Currently patient is resting in bed she does not appear to Trystyn uncomfortable she has a low-grade elevated temperature of 99.1.  She is not really verbalizing or opening her eyes.  I suspect she is entering the end stages of life.     Past Medical History:  Diagnosis Date  . Breast mass, right 12/13/2013  . Chronic diastolic CHF (congestive  heart failure) (HCC) 07/19/2013  . Chronic diastolic heart failure (HCC)   . Chronic kidney disease, stage II (mild)   . Chronic renal disease, stage 2, mildly decreased glomerular filtration rate (GFR) between 60-89 mL/min/1.73 square meter 09/03/2014  . Closed T12 fracture (HCC) 07/16/2013  . Depression 07/18/2013  . Diabetes mellitus   . Diabetes mellitus due to underlying condition (HCC) 06/21/2013  . Diabetes mellitus without complication (HCC)   . Dysphagia, oropharyngeal phase   . GERD (gastroesophageal reflux disease) 12/17/2014  . Gout   . Hypertension   . Hypertensive heart disease with congestive heart failure (HCC) 06/21/2013  . Iron deficiency anemia 09/03/2014  . Iron deficiency anemia, unspecified   . Leukocytosis 07/18/2013  . Lumbago 10/23/2013  . Lung nodule < 6cm on CT 11/13/2016  . Osteopenia 07/16/2013  . Syncope 06/21/2013  . Type 2 diabetes, controlled, with neuropathy (HCC) 10/18/2013  . Unspecified constipation 10/18/2013  . Vitamin D deficiency 04/11/2016   Past Surgical History:  Procedure Laterality Date  . NO PAST SURGERIES      Allergies  Allergen Reactions  . Tequin [Gatifloxacin] Other (See Comments)    Unknown rxn per MAR    Allergies as of 08/21/2019      Reactions   Tequin [gatifloxacin] Other (See Comments)   Unknown rxn per [facility]      Medication List       Accurate as of August 21, 2019  4:36 PM. If you have any questions, ask your nurse or doctor.  ertapenem  IVPB Commonly known as: INVANZ Inject 500 mg into the vein daily.   escitalopram 10 MG tablet Commonly known as: LEXAPRO Take 10 mg by mouth daily.   ferrous sulfate 220 (44 Fe) MG/5ML solution :Give 330 mg ( 7.5 ml ) by mouth daily   furosemide 40 MG tablet Commonly known as: LASIX Take 40 mg by mouth daily.   gabapentin 100 MG capsule Commonly known as: NEURONTIN Take 100 mg by mouth at bedtime.   Nutritional Supplement Liqd Take 1 each by mouth in the  morning, at noon, and at bedtime. Magic Cup   potassium chloride 10 MEQ tablet Commonly known as: KLOR-CON Take 30 mEq by mouth daily.   sodium chloride 0.45 % Inject into the vein. :Start IVF 1/2 NS at 75 cc/hr x 3 litres for Hyponatremia and increased Cr   traMADol 50 MG tablet Commonly known as: ULTRAM Take 25 mg by mouth 2 (two) times daily. 0.5 tablet to = 25 mg   Vitamin D3 1.25 MG (50000 UT) Caps Take 1 capsule by mouth See admin instructions. Twice weekly       Review of Systems   Is unobtainable secondary to dementia  Immunization History  Administered Date(s) Administered  . Influenza-Unspecified 04/06/2017, 03/13/2018, 04/12/2019  . Moderna SARS-COVID-2 Vaccination 06/27/2019, 07/25/2019  . PPD Test 08/12/2013  . Pneumococcal Conjugate-13 12/12/2017  . Pneumococcal Polysaccharide-23 06/23/2013  . Tdap 12/11/2017, 11/28/2018   Pertinent  Health Maintenance Due  Topic Date Due  . FOOT EXAM  06/26/2019  . DEXA SCAN  06/28/2023 (Originally 06/27/1999)  . URINE MICROALBUMIN  12/01/2019  . HEMOGLOBIN A1C  01/30/2020  . OPHTHALMOLOGY EXAM  06/17/2020  . INFLUENZA VACCINE  Completed  . PNA vac Low Risk Adult  Completed   Fall Risk  01/28/2019 12/11/2017 12/12/2016 12/09/2016  Falls in the past year? (No Data) No Yes Yes  Comment Emmi Telephone Survey: data to providers prior to load - 10/09/16 -  Number falls in past yr: (No Data) - - 1  Comment Emmi Telephone Survey Actual Response =  - - -  Injury with Fall? - - - Yes   Functional Status Survey:    Vitals:   08/21/19 1629  BP: 100/60  Pulse: 97  Resp: 20  Temp: 99.1 F (37.3 C)  TempSrc: Oral  SpO2: 97%  Weight: 85 lb 1.6 oz (38.6 kg)  Height: 4\' 9"  (1.448 m)   Body mass index is 18.42 kg/m. Physical Exam General this is a frail elderly female who is lying in bed she does not appear to Ellyce uncomfortable she does not really open her eyes or follow any verbal commands.  Her skin is warm and  dry.  Eyes again she did not really open her eyes.  Oropharynx was difficult to assess as well since she did not open her mouth.  Chest could not really appreciate any overt congestion or labored breathing.  Heart was regular rate and rhythm minimally tachycardic at just over 100 bpm.  She does not have significant edema.  Abdomen was soft does not appear to Jackolyn tender there are positive bowel sounds.  Musculoskeletal holds her lower extremities in somewhat of a semicontracted position.  Neurologic response somewhat to invasive maneuvers but otherwise lies in bed quietly.   Labs reviewed: Recent Labs    05/30/19 0000 08/02/19 0000 08/16/19 0000  NA 140 139 147  K 4.2 4.9 4.1  CL 101 96* 103  CO2 28* 31* 30*  BUN  20 20 45*  CREATININE 0.9 1.1 1.4*  CALCIUM 8.4* 9.0 9.7   Recent Labs    03/01/19 0000  AST 17  ALT 11  ALKPHOS 65  ALBUMIN 3.5   Recent Labs    08/08/19 0000 08/12/19 0000 08/16/19 0000  WBC 5.4 6.8 11.0  NEUTROABS 4 5 9   HGB 11.2* 11.9* 12.2  HCT 33* 36 37  PLT 227 199 219   Lab Results  Component Value Date   TSH 0.53 08/02/2019   Lab Results  Component Value Date   HGBA1C 5.6 08/02/2019   Lab Results  Component Value Date   CHOL 145 05/01/2018   HDL 48 05/01/2018   LDLCALC 78 05/01/2018   TRIG 96 05/01/2018   CHOLHDL 3.2 06/22/2013    Significant Diagnostic Results in last 30 days:  No results found.  Assessment/Plan T #1 UTI positive for E. coli ESBL-will start her on ertapenem 500 mg via IV for 14 days and continue to monitor   #2 failure to thrive-again I suspect this is secondary to her dementia which appears to Shawntia nearing the end stage   At this point continue comfort measures she does have an IV for 3 L as a comfort measure.  I did speak with her granddaughter who was in the room and explained the dementia prognosis-which is progressive with significantly decreased p.o. intake in the end-stage-I did explain to  granddaughter that this is the natural progression of the disease.  And that this was not thought to Yoselyn uncomfortable to the patient as long as she is monitored.  Her granddaughter had brought some food but I did explain that trying to feed her grandmother would lead to aspiration risk and she expressed understanding.  [id number]

## 2019-08-23 ENCOUNTER — Non-Acute Institutional Stay (SKILLED_NURSING_FACILITY): Payer: Medicare Other | Admitting: Internal Medicine

## 2019-08-23 ENCOUNTER — Encounter: Payer: Self-pay | Admitting: Internal Medicine

## 2019-08-23 DIAGNOSIS — E870 Hyperosmolality and hypernatremia: Secondary | ICD-10-CM | POA: Diagnosis not present

## 2019-08-23 DIAGNOSIS — N390 Urinary tract infection, site not specified: Secondary | ICD-10-CM | POA: Diagnosis not present

## 2019-08-23 DIAGNOSIS — B9629 Other Escherichia coli [E. coli] as the cause of diseases classified elsewhere: Secondary | ICD-10-CM | POA: Diagnosis not present

## 2019-08-23 DIAGNOSIS — Z1612 Extended spectrum beta lactamase (ESBL) resistance: Secondary | ICD-10-CM

## 2019-08-23 DIAGNOSIS — G301 Alzheimer's disease with late onset: Secondary | ICD-10-CM

## 2019-08-23 DIAGNOSIS — R627 Adult failure to thrive: Secondary | ICD-10-CM

## 2019-08-23 DIAGNOSIS — F0280 Dementia in other diseases classified elsewhere without behavioral disturbance: Secondary | ICD-10-CM | POA: Diagnosis not present

## 2019-08-24 NOTE — Progress Notes (Signed)
Location:  [facility]   Place of Service:  SNF (31)SNF  [name], MD  [name], MD as PCP - General (Internal Medicine)  Extended Emergency Contact Information Primary Emergency Contact: [name],Hung Address: [address]          [place], [place] [address] [name] of [place] Home Phone: [phone] Mobile Phone: [phone] Relation: Son Secondary Emergency Contact: [name],[name] Address: [address]          [place], [place] [address] [name] of [place] Home Phone: [phone] Relation: Granddaughter    Allergies: Tequin [gatifloxacin]  Chief Complaint  Patient presents with  . Acute Visit    Patient is seen for a urinary tract infection.    HPI: Patient is 86 y.o. female who is being seen to discuss for further care should Edison given to patient.  Patient said 3 days of IV fluids and is doing markedly better with several doses of antibiotics.  Patient's family has seen her none multiple family members including her daughter who understands that the underlying problem here is dementia and failure to thrive and are comfortable with comfort care.  Past Medical History:  Diagnosis Date  . Breast mass, right 12/13/2013  . Chronic diastolic CHF (congestive heart failure) (HCC) 07/19/2013  . Chronic diastolic heart failure (HCC)   . Chronic kidney disease, stage II (mild)   . Chronic renal disease, stage 2, mildly decreased glomerular filtration rate (GFR) between 60-89 mL/min/1.73 square meter 09/03/2014  . Closed T12 fracture (HCC) 07/16/2013  . Depression 07/18/2013  . Diabetes mellitus   . Diabetes mellitus due to underlying condition (HCC) 06/21/2013  . Diabetes mellitus without complication (HCC)   . Dysphagia, oropharyngeal phase   . GERD (gastroesophageal reflux disease) 12/17/2014  . Gout   . Hypertension   . Hypertensive heart disease with congestive heart failure (HCC) 06/21/2013  . Iron  deficiency anemia 09/03/2014  . Iron deficiency anemia, unspecified   . Leukocytosis 07/18/2013  . Lumbago 10/23/2013  . Lung nodule < 6cm on CT 11/13/2016  . Osteopenia 07/16/2013  . Syncope 06/21/2013  . Type 2 diabetes, controlled, with neuropathy (HCC) 10/18/2013  . Unspecified constipation 10/18/2013  . Vitamin D deficiency 04/11/2016    Past Surgical History:  Procedure Laterality Date  . NO PAST SURGERIES      Allergies as of 08/23/2019      Reactions   Tequin [gatifloxacin] Other (See Comments)   Unknown rxn per [facility]      Medication List       Accurate as of August 23, 2019 11:59 PM. If you have any questions, ask your nurse or doctor.        ertapenem  IVPB Commonly known as: INVANZ Inject 500 mg into the vein daily.   escitalopram 10 MG tablet Commonly known as: LEXAPRO Take 10 mg by mouth daily.   ferrous sulfate 220 (44 Fe) MG/5ML solution :Give 330 mg ( 7.5 ml ) by mouth daily   furosemide 40 MG tablet Commonly known as: LASIX Take 40 mg by mouth daily.   gabapentin 100 MG capsule Commonly known as: NEURONTIN Take 100 mg by mouth at bedtime.   Nutritional Supplement Liqd Take 1 each by mouth in the morning, at noon, and at bedtime. Magic Cup   potassium chloride 10 MEQ tablet Commonly known as: KLOR-CON Take 30 mEq by mouth daily.   sodium chloride 0.45 % Inject into the vein. :Start IVF 1/2  NS at 75 cc/hr x 3 litres for Hyponatremia and increased Cr   traMADol 50 MG tablet Commonly known as: ULTRAM Take 25 mg by mouth 2 (two) times daily. 0.5 tablet to = 25 mg   Vitamin D3 1.25 MG (50000 UT) Caps Take 1 capsule by mouth See admin instructions. Twice weekly       No orders of the defined types were placed in this encounter.   Immunization History  Administered Date(s) Administered  . Influenza-Unspecified 04/06/2017, 03/13/2018, 04/12/2019  . Moderna SARS-COVID-2 Vaccination 06/27/2019, 07/25/2019  . PPD Test 08/12/2013  .  Pneumococcal Conjugate-13 12/12/2017  . Pneumococcal Polysaccharide-23 06/23/2013  . Tdap 12/11/2017, 11/28/2018    Social History   Tobacco Use  . Smoking status: Former Research scientist (life sciences)  . Smokeless tobacco: Never Used  Substance Use Topics  . Alcohol use: No    Review of Systems unable to obtain secondary to dementia     Vitals:   09/08/2019 1809  BP: 100/70  Pulse: 97  Resp: 20  Temp: 99.1 F (37.3 C)   Body mass index is 18.39 kg/m. Physical Exam  GENERAL APPEARANCE: Looks marginally better, no acute distress  SKIN: No diaphoresis rash HEENT: Unremarkable RESPIRATORY: Breathing is even, unlabored. Lung sounds are clear   CARDIOVASCULAR: Heart RRR no murmurs, rubs or gallops. No peripheral edema  GASTROINTESTINAL: Abdomen is soft, non-tender, not distended w/ normal bowel sounds.  GENITOURINARY: Bladder non tender, not distended  MUSCULOSKELETAL: No abnormal joints or musculature NEUROLOGIC: Cranial nerves 2-12 grossly intact. Moves all extremities PSYCHIATRIC: Dementia  Patient Active Problem List   Diagnosis Date Noted  . Dementia without behavioral disturbance ([place]) 08/13/2019  . Failure to thrive in adult 08/09/2019  . Real time reverse transcriptase PCR positive for COVID-19 virus 01/16/2019  . Suicidal ideation 12/08/2018  . Hyperlipidemia associated with type 2 diabetes mellitus ([place]) 06/10/2018  . Lung nodule < 6cm on CT 11/13/2016  . Vitamin D deficiency 04/11/2016  . UTI (urinary tract infection) 03/24/2015  . GERD (gastroesophageal reflux disease) 12/17/2014  . Hypokalemia 12/17/2014  . Breast mass, right 12/13/2013  . Lumbago 10/23/2013  . Type 2 diabetes, controlled, with neuropathy ([place]) 10/18/2013  . Peripheral sensory neuropathy due to type 2 diabetes mellitus ([place]) 10/18/2013  . Diabetes mellitus, controlled ([place]) 07/19/2013  . Chronic diastolic CHF (congestive heart failure) ([place]) 07/19/2013  . Leukocytosis 06/21/2013  . Hypertensive heart  disease with congestive heart failure ([place]) 06/21/2013  . Diabetes mellitus due to underlying condition ([place]) 06/21/2013    CMP     Component Value Date/Time   NA 147 08/16/2019 0000   K 4.1 08/16/2019 0000   CL 103 08/16/2019 0000   CO2 30 (A) 08/16/2019 0000   GLUCOSE 109 (H) 07/21/2018 1105   BUN 45 (A) 08/16/2019 0000   CREATININE 1.4 (A) 08/16/2019 0000   CREATININE 0.95 07/21/2018 1105   CALCIUM 9.7 08/16/2019 0000   PROT 7.6 07/21/2018 1105   ALBUMIN 3.5 03/01/2019 0000   AST 17 03/01/2019 0000   ALT 11 03/01/2019 0000   ALKPHOS 65 03/01/2019 0000   BILITOT 0.7 07/21/2018 1105   GFRNONAA 35.79 08/16/2019 0000   GFRAA 41.48 08/16/2019 0000   Recent Labs    05/30/19 0000 08/02/19 0000 08/16/19 0000  NA 140 139 147  K 4.2 4.9 4.1  CL 101 96* 103  CO2 28* 31* 30*  BUN 20 20 45*  CREATININE 0.9 1.1 1.4*  CALCIUM 8.4* 9.0 9.7   Recent Labs  03/01/19 0000  AST 17  ALT 11  ALKPHOS 65  ALBUMIN 3.5   Recent Labs    08/08/19 0000 08/12/19 0000 08/16/19 0000  WBC 5.4 6.8 11.0  NEUTROABS 4 5 9   HGB 11.2* 11.9* 12.2  HCT 33* 36 37  PLT 227 199 219   No results for input(s): CHOL, LDLCALC, TRIG in the last 8760 hours.  Invalid input(s): HCL Lab Results  Component Value Date   MICROALBUR 1.2 12/01/2018   Lab Results  Component Value Date   TSH 0.53 08/02/2019   Lab Results  Component Value Date   HGBA1C 5.6 08/02/2019   Lab Results  Component Value Date   CHOL 145 05/01/2018   HDL 48 05/01/2018   LDLCALC 78 05/01/2018   TRIG 96 05/01/2018   CHOLHDL 3.2 06/22/2013    Significant Diagnostic Results in last 30 days:  No results found.  Assessment and Plan  ESBL E. coli UTI/failure to thrive/dementia/hypernatremia -it is unlikely that treatment is infection and using more IV fluids is going to terms patient, however she already has a midline in place and she is already been given some antibiotic for UTI so have decided that she will have more  IV fluid at one half normal saline 75 cc an hour and will continue ertapenem 500 mg daily for 7 days.     06/24/2013, MD

## 2019-08-25 ENCOUNTER — Encounter: Payer: Self-pay | Admitting: Internal Medicine

## 2019-08-25 DIAGNOSIS — E870 Hyperosmolality and hypernatremia: Secondary | ICD-10-CM | POA: Insufficient documentation

## 2019-08-26 DEATH — deceased
